# Patient Record
Sex: Male | Born: 1945 | Race: White | Hispanic: No | Marital: Married
Health system: Midwestern US, Community
[De-identification: ages and names within clinical notes are randomized; demographics above are authoritative.]

## PROBLEM LIST (undated history)

## (undated) DIAGNOSIS — N401 Enlarged prostate with lower urinary tract symptoms: Secondary | ICD-10-CM

## (undated) DIAGNOSIS — Z973 Presence of spectacles and contact lenses: Secondary | ICD-10-CM

## (undated) DIAGNOSIS — G9520 Unspecified cord compression: Secondary | ICD-10-CM

## (undated) DIAGNOSIS — I10 Essential (primary) hypertension: Secondary | ICD-10-CM

## (undated) DIAGNOSIS — G629 Polyneuropathy, unspecified: Secondary | ICD-10-CM

## (undated) DIAGNOSIS — G8929 Other chronic pain: Secondary | ICD-10-CM

## (undated) DIAGNOSIS — I82409 Acute embolism and thrombosis of unspecified deep veins of unspecified lower extremity: Secondary | ICD-10-CM

## (undated) DIAGNOSIS — M48061 Spinal stenosis, lumbar region without neurogenic claudication: Secondary | ICD-10-CM

## (undated) DIAGNOSIS — Z7901 Long term (current) use of anticoagulants: Secondary | ICD-10-CM

## (undated) DIAGNOSIS — R011 Cardiac murmur, unspecified: Secondary | ICD-10-CM

## (undated) DIAGNOSIS — M25511 Pain in right shoulder: Secondary | ICD-10-CM

## (undated) DIAGNOSIS — B353 Tinea pedis: Secondary | ICD-10-CM

## (undated) DIAGNOSIS — H2510 Age-related nuclear cataract, unspecified eye: Secondary | ICD-10-CM

## (undated) DIAGNOSIS — M75100 Unspecified rotator cuff tear or rupture of unspecified shoulder, not specified as traumatic: Secondary | ICD-10-CM

## (undated) DIAGNOSIS — M545 Low back pain, unspecified: Secondary | ICD-10-CM

## (undated) DIAGNOSIS — M48062 Spinal stenosis, lumbar region with neurogenic claudication: Secondary | ICD-10-CM

## (undated) DIAGNOSIS — M1990 Unspecified osteoarthritis, unspecified site: Secondary | ICD-10-CM

## (undated) HISTORY — DX: Unspecified cord compression: G95.20

## (undated) HISTORY — PX: REPLACEMENT TOTAL KNEE: SUR1224

## (undated) HISTORY — PX: HX BACK SURGERY: SHX140

## (undated) HISTORY — DX: Polyneuropathy, unspecified: G62.9

## (undated) HISTORY — PX: HX TONSILLECTOMY: SHX27

## (undated) HISTORY — PX: COLONOSCOPY: SHX174

## (undated) HISTORY — DX: Acute embolism and thrombosis of unspecified deep veins of unspecified lower extremity: I82.409

## (undated) HISTORY — DX: Essential (primary) hypertension: I10

## (undated) HISTORY — PX: HX WISDOM TEETH EXTRACTION: SHX21

## (undated) HISTORY — PX: HX CERVICAL SPINE SURGERY: 2100001197

## (undated) NOTE — Consults (Signed)
Associated Order(s): IP CONSULT TO HOSPITALIST  Formatting of this note is different from the original.  H&P    Patient: Gary White               Sex: male             MRN: [id number]    Date of Birth:  1946-04-02      Age:  81 y.o.      No chief complaint on file.    SHPI     Gary White is a 58 y.o. male. Medical service consult was placed for Medical management.   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  Discontinued warfarin 1 month ago.  Patient decided just not take it anymore.  Was told that he was out of risk range.    Past Medical History:   Diagnosis Date   ? Arthritis    ? BPH (benign prostatic hyperplasia)    ? Cervical disc disease    ? 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 ED (erectile dysfunction)    ? Hypertension    ? Sleep apnea     CPAP   ? 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 HX BACK SURGERY  03/2016    Lumbar "Drill holes and cleaned it out".   ? HX CATARACT REMOVAL Bilateral    ? HX ENDOSCOPY      colonoscopy last was in 2008    ? 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 Arthritis-osteo Mother    ? Hypertension Mother    ? Arthritis-osteo Father    ? Arthritis-osteo Brother    ? Cancer Paternal Uncle      unknown   ? Cancer Paternal Uncle      stomach   ? Diabetes Paternal Uncle      Social History     Social History   ? Marital status: MARRIED     Spouse name: N/A   ? Number of children: N/A   ? Years of education: N/A     Social History Main Topics   ? Smoking status: Former Smoker     Packs/day: 0.50     Years: 20.00     Types: Cigarettes     Quit date: 11/30/1984   ? Smokeless tobacco: Never Used   ? Alcohol use No   ? Drug use: No   ? Sexual activity: Yes     Partners: Female     Other Topics Concern   ? Blood Transfusions No     Social History Narrative    Medicare  wellness 09/23/13      Prior to Admission medications    Medication Sig Start Date End Date Taking? Authorizing Provider   cpap machine kit    Yes Historical Provider   diclofenac EC (VOLTAREN) 75 mg EC tablet Take 75 mg by mouth two (2) times a day.   Yes Historical Provider   tamsulosin (FLOMAX) 0.4 mg capsule Take 1 Cap by mouth daily. 11/26/15  Yes Brian P Defade, DO   hydrocortisone valerate (WESTCORT) 0.2 % topical cream Apply  to affected area two (2) times a day. use thin layer   Yes Historical  Provider   testosterone 75 mg pllt by Implant route.   Yes Historical Provider   warfarin (COUMADIN) 5 mg tablet TAKE AS DIRECTED 11/26/15  Yes Hermenia Bers, DO   warfarin (COUMADIN) 1 mg tablet As directed 09/09/15  Yes Lauren Dinah Beers, DO   multivitamin (ONE A DAY) tablet Take 0.5 Tabs by mouth two (2) times a day.   Yes Historical Provider   lisinopril (PRINIVIL, ZESTRIL) 10 mg tablet Take 10 mg by mouth daily.    Historical Provider     No Known Allergies    Review of Systems  Comprehensive 10 point review of systems is Negative except as mentioned in HPI.     Immunizations: Immunization history is noncontributory to this encounter.    Physical Exam     Visit Vitals   ? BP 131/70 (BP 1 Location: Left arm, BP Patient Position: At rest)   ? Pulse 98   ? Temp 98.2 F (36.8 C)   ? Resp 18   ? Ht 5\' 10"  (1.778 m)   ? Wt 92.5 kg (204 lb)   ? SpO2 100%   ? BMI 29.27 kg/m2     Temp (24hrs), Avg:98.1 F (36.7 C), Min:97.7 F (36.5 C), Max:98.4 F (36.9 C)    Oxygen Therapy  O2 Sat (%): 100 % (04/18/16 1945)  Pulse via Oximetry: 86 beats per minute (04/18/16 1755)  O2 Device: Room air (04/18/16 1945)    Intake/Output Summary (Last 24 hours) at 04/18/16 2049  Last data filed at 04/18/16 1728   Gross per 24 hour   Intake             1000 ml   Output              800 ml   Net              200 ml       General: No acute distress,Alert, Ox3  HEENT:           Atraumatic, PERRLwithout icterus, oral mucosa moist  Lungs:  CTA  Bilaterally.   No wheezing, no crackles  CVS:  Regular rate and rhythm, No murmur, rub, or gallop, no carotid bruits.    No edema.  Abdomen: Soft, Non distended, Non tender, Positive bowel sounds  Extremities: No cyanosis, clubbing, pedal pulses present  Neurologic: No focal deficits, CN 3-12 intact, no motor/sensory deficits noted in                                  extremities  Skin:               No rashes  Hematologic:  No splenomegaly, excessive bruising or petechiae noted  Lymphatics:    No cervical, axillary or inguinal adenopathy    Musculoskeletal: Neck supple, no joint/muscle tenderness to palpation  Psych:             Appropriate mood and affect    Lab/Data Reviewed:  Recent Results (from the past 24 hour(s))   URINALYSIS W/ RFLX MICROSCOPIC    Collection Time: 04/18/16  6:49 PM   Result Value Ref Range    Color AMBER      Appearance CLOUDY      Specific gravity 1.020 1.002 - 1.030      pH (UA) 7.5 4.5 - 8.0      Protein NEGATIVE  mg/dL    Glucose NEGATIVE  mg/dL    Ketone NEGATIVE  mg/dL    Bilirubin NEGATIVE       Blood NEGATIVE       Urobilinogen 1.0 0 - 1 EU/dL    Nitrites NEGATIVE       Leukocyte Esterase NEGATIVE      URINE MICROSCOPIC    Collection Time: 04/18/16  6:49 PM   Result Value Ref Range    WBC 0-3 0 - 2 /hpf    RBC NONE 0 - 2 /hpf    Epithelial cells 3-5 /lpf    Bacteria 1+ /hpf   PROTHROMBIN TIME + INR    Collection Time: 04/18/16  8:09 PM   Result Value Ref Range    INR 1.0 0.9 - 1.1      Prothrombin time 13.0 11.4 - 15.1 sec     Assessment/Plan     20 y.o. male.  we were consulted for medical management.     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  Bridge with Lovenox.   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 Management as mentioned above.      Hypertension (04/18/2016):  No acute  issues  Continue lisinopril.    DVT ppx:   Lovenox/warfarin.     We appreciate this consult.  We will continue to follow along with you.    Signed:  Vickie Epley, MD  04/18/2016   8:54 PM    I personally spent a total of  35 minutes in the care of this patient.   Electronically signed by Vickie Epley, MD at 04/18/2016  8:54 PM EDT

## (undated) NOTE — Progress Notes (Signed)
Formatting of this note is different from the original.  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    Lab Results   Component Value Date/Time    Prostate Specific Ag 3.4 05/23/2018 09:00 AM    Prostate Specific Ag 1.5 01/02/2017 08:15 AM    Prostate Specific Ag 2.4 03/16/2016 10:15 AM     Results for orders placed or performed in visit on 04/26/17   AMB POC URINALYSIS DIP STICK AUTO W/O MICRO     Status: None   Result Value Ref Range Status    Color (UA POC) Yellow  Final    Clarity (UA POC) Clear  Final    Glucose (UA POC) Negative Negative Final    Bilirubin (UA POC) Negative Negative Final    Ketones (UA POC) Negative Negative Final    Specific gravity (UA POC) 1.015 1.001 - 1.035 Final    Blood (UA POC) Negative Negative Final    pH (UA POC) 7.5 4.6 - 8.0 Final    Protein (UA POC) Negative Negative Final    Urobilinogen (UA POC) 0.2 mg/dL 0.2 - 1 Final    Nitrites (UA POC) Negative Negative Final    Leukocyte esterase (UA POC) Negative Negative Final     Pre-biopsy Preparations:  ? omnicef 100 mg TID po  ? Fleets enema--2 hours prior to procedure.  ? Gentamicin 80 mg IM in the right buttocks    Transrectal Ultrasound Imaging of the Prostate, and Bilateral Neurovascular bundle block:    Multiple transverse and longitudinal images of the prostate, seminal vesicles and bladder revealed enlarged prostate of 50 grams.  Neurovascular bundle block was done bilaterally by injecting marcaine 0.25% plain under ultrasound guidance.  5 mL were injected into periprostatic tissue at the level of prostate base bilaterally in the neurovascular bundle area using a spinal needle.    Prostate dimension:  Height 33.1 mm  Length 53.6 mm  Width 54.2 mm  Prostate volume: 50.4 cm cubed  PSA Density: 0.07    Biopsy Procedure:  Local anesthesia of the perineum was achieved by injecting 2% Lidocaine jelly into  the rectal vault.  Neurovascular bundle block was achieved as described above.  An 18 gauge biopsy needle was then passed through the ultrasound guide obtaining 2 cores of tissue from the left apex, mid gland, and base.  2 cores of tissue from the right apex, mid gland, and base were obtained.  The biopsy needle placement was guided by real time transrectal ultrasound imaging of the needle along its path through the rectal mucosa and into the prostate. The patient tolerated the procedure with minimal blood loss.  The patient was given oral and written instructions regarding post biopsy precautions as well as instruction regarding activity, medication, and follow up.    Assessment:  1. Elevated PSA  2. BPH with LUTS    Plan:  1. Complete antibiotics  2. Post-biopsy instructions were given  3. I will see back in the office in 2 weeks for pathology review    Electronically signed by Laureen Ochs at 07/30/2018  3:44 PM EST

## (undated) NOTE — Progress Notes (Signed)
Formatting of this note might be different from the original.  Reviewed home exercise program with pt to cont working on ROM stretches and exercises. Pt voices understanding.  Electronically signed by Sabino Donovan at 04/20/2016 11:43 AM EDT

## (undated) NOTE — Progress Notes (Signed)
Formatting of this note might be different from the original.  Send copy to [place] (derm in [place])  Overall labs work looks good; there are a few labs that are outside the standard normal range, clinical significance doubtful  Electronically signed by Jeronimo Greaves E at 03/17/2016 10:59 AM EDT

## (undated) NOTE — Progress Notes (Signed)
Formatting of this note might be different from the original.  Notified per Missy, PTA, that walkers delivered today have wheels.   Phone call to [place] at [facility] to have walker switched out.  Pt notified at bedside and verablizes understanding.   Also reports that his wife had surgery on her hand today and she will not be able to assist him to OP therapy.  Requests [facility] HH when given list of providers, chooses [facility].    Referral placed into CC and called to [facility].  Per [facility] Pharmacist, Lovenox is $51.70 for 5 syringes.  Pt notified at bedside and requests that RX be transferred to [facility].    [place], Rx must be backed out per [facility] and RX will need to be sent there.  Report to Yisroel Ramming, who will page Dr Ericka Pontiff for RX for meds to beds. Report to Hollie Salk, Therapist, sports.     Discharge RN to call report to [place] [phone] and make sure that walker has been switched out per [facility] to standard walker. Pt wants Meds to Beds.     Note to Kardex.   Electronically signed by Royetta Asal at 04/20/2016 12:47 PM EDT

## (undated) NOTE — Progress Notes (Signed)
Formatting of this note might be different from the original.  Patient notified, voiced understanding.  Faxed to [place] office.  Electronically signed by Depriest, Tiffany L at 03/17/2016 11:54 AM EDT

## (undated) NOTE — Unmapped External Note (Signed)
Formatting of this note is different from the original.  TRANSFER - OUT REPORT:    Verbal report given to RN, Morey Hummingbird on Gary White  being transferred to Ortho (unit) for routine post - op       Report consisted of patient?s Situation, Background, Assessment and   Recommendations(SBAR).     Information from the following report(s) SBAR was reviewed with the receiving nurse.    Opportunity for questions and clarification was provided.      Patient transported with:   Registered Nurse      Electronically signed by Cleophus Molt at 04/18/2016  5:51 PM EDT

## (undated) NOTE — Progress Notes (Signed)
Formatting of this note might be different from the original.  Problem: Falls - Risk of  Goal: *Absence of Falls  Document Schmid Fall Risk and appropriate interventions in the flowsheet.   Outcome: Progressing Towards Goal  Fall Risk Interventions:  Mobility Interventions: Patient to call before getting OOB        Medication Interventions: Bed/chair exit alarm            Problem: Patient Education: Go to Patient Education Activity  Goal: Patient/Family Education  Outcome: Progressing Towards Goal  Patient will not fall this shift.      Electronically signed by Jenna Luo at 04/20/2016  4:21 AM EDT

## (undated) NOTE — Progress Notes (Signed)
Formatting of this note is different from the original.  Ortho Daily Progress Note    04/20/2016  3:26 PM    POD:  2  S/P:  tka    Afebrile/VSS  Doing well without complaints of nausea  Pain well controlled    Recent Results (from the past 24 hour(s))   CBC WITH AUTOMATED DIFF    Collection Time: 04/19/16  4:29 PM   Result Value Ref Range    WBC 11.5 (H) 4.5 - 10.8 K/uL    RBC 4.02 (L) 4.7 - 6.1 M/uL    HGB 13.4 (L) 13.5 - 17.5 g/dL    HCT 37.7 (L) 41 - 53 %    MCV 93.8 80 - 100 FL    MCH 33.3 (H) 27 - 31 PG    MCHC 35.5 31 - 37 g/dL    RDW 13.4 11.5 - 14.5 %    PLATELET 100 (L) 130 - 400 K/uL    MPV 9.7 5.9 - 10.3 FL    NEUTROPHILS 85 (H) 40 - 74 %    LYMPHOCYTES 7 (L) 19 - 48 %    MONOCYTES 8 3 - 9 %    EOSINOPHILS 0 0 - 5 %    BASOPHILS 0 0 - 2 %    ABS. NEUTROPHILS 9.7 (H) 1.5 - 8.0 K/UL    ABS. LYMPHOCYTES 0.8 0.8 - 3.5 K/UL    ABS. MONOCYTES 0.9 0.8 - 3.5 K/UL    ABS. EOSINOPHILS 0.0 0.0 - 0.5 K/UL    ABS. BASOPHILS 0.0 0.0 - 0.1 K/UL    DF AUTOMATED      IMMATURE GRANULOCYTES 0 (L) 2 - 10 %    ABS. IMM. GRANS. 0.0 K/UL   PROTHROMBIN TIME + INR    Collection Time: 04/19/16  4:29 PM   Result Value Ref Range    INR 1.2 (H) 0.9 - 1.1      Prothrombin time 15.6 (H) 11.4 - 15.1 sec   CBC WITH AUTOMATED DIFF    Collection Time: 04/20/16  5:41 AM   Result Value Ref Range    WBC 7.3 4.5 - 10.8 K/uL    RBC 4.00 (L) 4.7 - 6.1 M/uL    HGB 12.8 (L) 13.5 - 17.5 g/dL    HCT 37.8 (L) 41 - 53 %    MCV 94.5 80 - 100 FL    MCH 32.0 (H) 27 - 31 PG    MCHC 33.9 31 - 37 g/dL    RDW 13.4 11.5 - 14.5 %    PLATELET 95 (L) 130 - 400 K/uL    MPV 10.0 5.9 - 10.3 FL    NEUTROPHILS 79 (H) 40 - 74 %    LYMPHOCYTES 11 (L) 19 - 48 %    MONOCYTES 8 3 - 9 %    EOSINOPHILS 2 0 - 5 %    BASOPHILS 0 0 - 2 %    ABS. NEUTROPHILS 5.8 1.5 - 8.0 K/UL    ABS. LYMPHOCYTES 0.8 0.8 - 3.5 K/UL    ABS. MONOCYTES 0.6 (L) 0.8 - 3.5 K/UL    ABS. EOSINOPHILS 0.1 0.0 - 0.5 K/UL    ABS. BASOPHILS 0.0 0.0 - 0.1 K/UL    DF AUTOMATED      IMMATURE GRANULOCYTES 0 (L)  2 - 10 %    ABS. IMM. GRANS. 0.0 K/UL   PROTHROMBIN TIME + INR    Collection Time: 04/20/16  5:41 AM   Result Value  Ref Range    INR 1.6 (H) 0.9 - 1.1      Prothrombin time 18.7 (H) 11.4 - 15.1 sec       Dressings clean and dry  Moving LE well  Neurocirculatory exam WNL.    PLAN:  Tolerating regular diet  DVT prophylaxis-lovenox/coumadin  WBAT with PT-mobilization  Pain Control  Plan to D/C home  Hospitalist consult    Riley Lam, MD      Electronically signed by Riley Lam, MD at 04/20/2016  3:27 PM EDT

## (undated) NOTE — Progress Notes (Signed)
Formatting of this note might be different from the original.  Pt seated in chair on entry. Reports he is still "numb". KI applied. Sit>stand CGA. Gait training 145ft with SW and CGA. Knee buckling under brace, pt using UE's to support weight through walker. Stair training ascending/decending with CGA. Returned to chair. Seated A/AA ROM exercises/stretches for ROM/strength/function. ROM ~5-95 degrees. Pt seated in chair with call button in reach.  Electronically signed by Sabino Donovan at 04/19/2016 10:57 AM EDT

## (undated) NOTE — Progress Notes (Signed)
Formatting of this note might be different from the original.  Pt known to me from Prehab class. Spoke with pt at bedside this am who reports that he plans to go to OP therapy and wife plans to provide transportation.  States has cane but will need walker and chooses [facility] from provider list.   States has been on Coumadin at home and plans to use coumadin upon discharge.     Referral called and faxed to [facility] for walker delivery to room    Resolved from CM point of view. CM to be available to assist should needs arise.     Discharge RN to make sure that pt has RX for OP therapy and call [facility] [phone] if walker has not been delivered to room prior to discharge.  Note to kardex.     Care Management Interventions  PCP Verified by CM: Yes  Mode of Transport at Discharge: Self (family)  Transition of Care Consult (CM Consult): Discharge Planning  Discharge Durable Medical Equipment: No  Physical Therapy Consult: Yes  Occupational Therapy Consult: Yes  Speech Therapy Consult: No  Current Support Network: Lives with Spouse  Confirm Follow Up Transport: Family  Plan discussed with Pt/Family/Caregiver: Yes  Freedom of Choice Offered: Yes  Discharge Location  Discharge Placement: Home with home health  Electronically signed by Royetta Asal at 04/19/2016  3:45 PM EDT

## (undated) NOTE — Progress Notes (Signed)
Formatting of this note is different from the original.  HISTORY OF PRESENT ILLNESS  Gary White is a 78 y.o. male.    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    Chief Complaint   Patient presents with   ? Follow Up Chronic Condition     3 month follow up -FLU SHOT TODAY     HPI  The patient is a very pleasant 40 -year-old white male with the above listed past medical history who presents to the office today to follow up care.      The patient presents to follow up for longstanding hypertension. He has lost 20 lbs and stopped his lisinopril - initially cut 20 mg in half when BP started going low.   Pt is a  is taking BP at home has been well controlled. States its never higher than 135/83. Averages 125/80.   He denies any symptoms referable to hypertension. This would include but is not limited to headache, chest pain, shortness of breath, nausea, vomiting and neurological deficits.    BP Readings from Last 3 Encounters:   03/16/16 132/78   12/01/15 120/70   11/26/15 120/72     The patient presents to follow up for chronic anticoagulation on Coumadin.  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  The patient takes his PT and INR at home and the numbers are called or faxed to the office.  He has had stable levels for several months.  He is doing well, no signs of bleeding, no changes in diet, no recent illness.     The patient also presents to follow up today for longstanding elevated cholesterol.  He has not had his cholesterol checked in several months.  He has been trying to follow a diet closely.  About to have back and knee surgery so not exercising as much - but very careful with diet. Marland Kitchen  He is trying to be more active.    Overall the patient states the is doing quite well.  Discussed the patient's above normal  BMI with him. Body mass index is 29.56 kg/(m^2).    I have recommended the following interventions: weight loss from baseline weight .  The BMI follow up plan is as follows: BMI is out of normal parameters and plan is as follows: I have counseled this patient on diet and exercise regimens    Wt Readings from Last 3 Encounters:   03/16/16 206 lb (93.4 kg)   12/01/15 221 lb (100.2 kg)   11/26/15 221 lb (100.2 kg)     Lab Results   Component Value Date/Time    Cholesterol, total 161 09/04/2015 09:00 AM    HDL Cholesterol 38 09/04/2015 09:00 AM    LDL, calculated 99.64 09/04/2015 09:00 AM    VLDL, calculated 29.2 09/04/2015 09:00 AM    Triglyceride 146 09/04/2015 09:00 AM     The patient does not have known history of coronary artery disease.      Sees Dr. Abner Greenspan for ED and BPH - Needs PSA   Lab Results   Component Value Date/Time    Prostate Specific Ag 2.9 12/09/2014 08:40 AM    Prostate Specific Ag 2.2 09/23/2013 09:35 AM    Prostate Specific Ag 3.8 02/29/2012 08:55 AM         Preventative Health  Requests flu shot  flu shot today --  Needs Prevnar 13     Lab Results   Component Value Date/Time    Prostate Specific Ag 2.9 12/09/2014 08:40 AM    Prostate Specific Ag 2.2 09/23/2013 09:35 AM    Prostate Specific Ag 3.8 02/29/2012 08:55 AM     Review of Systems   Constitutional: Negative for fever, chills, weight loss, malaise/fatigue and diaphoresis.   HENT: Negative for congestion and sore throat.    Eyes: Negative for blurred vision and photophobia.   Respiratory: Negative for cough and shortness of breath.    Cardiovascular: Negative for chest pain, palpitations and leg swelling.   Gastrointestinal: Negative for heartburn, nausea, vomiting, abdominal pain, diarrhea, constipation, blood in stool and melena.   Genitourinary: Negative for dysuria.   Musculoskeletal: Negative for myalgias. +joint pain   Skin: Postive for rash- Seeing Dr. Diamantina Providence -- recurrent  Contact Dermatitis    Neurological: Negative for dizziness,  loss of consciousness, weakness and headaches.   Psychiatric/Behavioral: Negative.      Past Medical History:   Diagnosis Date   ? Arthritis    ? BPH (benign prostatic hyperplasia)    ? Cervical disc disease    ? DVT (deep venous thrombosis) ([place]) x 2 2009  and 2011     after knee surgery then after long car ride    ? ED (erectile dysfunction)    ? Hypertension    ? Thromboembolus [facility])      Past Surgical History:   Procedure Laterality Date   ? HX ENDOSCOPY      colonoscopy last was in 2008    ? 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 HX ORTHOPAEDIC       Family History   Problem Relation Age of Onset   ? Arthritis-osteo Mother    ? Hypertension Mother    ? Arthritis-osteo Father    ? Arthritis-osteo Brother    ? Cancer Paternal Uncle      unknown   ? Cancer Paternal Uncle      stomach   ? Diabetes Paternal Uncle      Social History     Social History   ? Marital status: MARRIED     Spouse name: N/A   ? Number of children: N/A   ? Years of education: N/A     Occupational History   ? Not on file.     Social History Main Topics   ? Smoking status: Former Smoker     Packs/day: 0.50     Years: 20.00     Types: Cigarettes     Quit date: 11/30/1984   ? Smokeless tobacco: Never Used   ? Alcohol use Yes      Comment: 3 glasses of wine "couple of drinks"    ? Drug use: No   ? Sexual activity: Yes     Partners: Female     Other Topics Concern   ? Blood Transfusions No     Social History Narrative    Medicare wellness 09/23/13      No Known Allergies  Current Outpatient Prescriptions on File Prior to Visit   Medication Sig Dispense Refill   ? tamsulosin (FLOMAX) 0.4 mg capsule Take 1 Cap by mouth daily. 90 Cap 3   ? hydrocortisone valerate (WESTCORT) 0.2 % topical cream Apply  to affected area two (2) times a day. use thin layer     ? testosterone  75 mg pllt by Implant route.     ? warfarin (COUMADIN) 5 mg tablet TAKE AS DIRECTED 90 Tab 3   ? multivitamin (ONE A DAY) tablet Take 1 Tab by mouth daily.     ? warfarin  (COUMADIN) 1 mg tablet As directed 180 Tab 2   ? fexofenadine (ALLEGRA) 180 mg tablet Take  by mouth daily.       No current facility-administered medications on file prior to visit.      Vitals:    03/16/16 0932   BP: 132/78   Pulse: 75   Resp: 18   Temp: 98.6 F (37 C)   TempSrc: Oral   SpO2: 99%   Weight: 206 lb (93.4 kg)   Height: 5\' 10"  (1.778 m)   .  Body mass index is 29.56 kg/(m^2).    Physical Exam   Nursing note and vitals reviewed.  Constitutional: He is oriented to person, place, and time. He appears well-developed and well-nourished. No distress.   HENT:   Head: Normocephalic and atraumatic.   Right Ear: External ear normal.   Left Ear: External ear normal.   Nose: Nose normal.   Mouth/Throat: Oropharynx is clear and moist. No oropharyngeal exudate.   Eyes: Conjunctivae are normal. Pupils are equal, round, and reactive to light. No scleral icterus.   Neck: Normal range of motion. Neck supple. No JVD present. No tracheal deviation present.   Cardiovascular: Normal rate, regular rhythm, normal heart sounds and intact distal pulses.  Exam reveals no gallop and no friction rub.    No murmur heard.  Pulmonary/Chest: Effort normal and breath sounds normal. No respiratory distress.   Abdominal: Soft. Bowel sounds are normal. He exhibits no distension and no mass. There is no tenderness.   Musculoskeletal:   Knee (r) crepitus - otherwise unremarkable   Lymphadenopathy:     He has no cervical adenopathy.   Neurological: He is alert and oriented to person, place, and time.   Skin: Skin is warm and dry. He has bilateral hemosiderin pigmentation, with no leg hair, and ulcerating rashes on LE being followed by Derm, without evidence of cellulitis.   Erythematous rash on extremities   Psychiatric: He has a normal mood and affect. His behavior is normal.     ASSESSMENT and PLAN    ICD-10-CM ICD-9-CM    1. Benign essential HTN I10 401.1 CBC WITH AUTOMATED DIFF      METABOLIC PANEL, COMPREHENSIVE      TSH 3RD  GENERATION   2. Encounter for immunization Z23 V03.89 pneumococcal 13 val conj dip (PREVNAR-13) 0.5 mL syrg injection   3. Screening PSA (prostate specific antigen) Z12.5 V76.44 PSA SCREENING (SCREENING)   4. Dyslipidemia E78.5 272.4 LIPID PANEL   5. Thromboembolus ([facility]) I74.9 444.9    6. Anticoagulated on Coumadin Z51.81 V58.83     Z79.01 V58.61    Continue coumadin   Continue with specialist    Follow-up Disposition: 6 months   lab results and schedule of future lab studies reviewed with patient  reviewed diet, exercise and weight control  cardiovascular risk and specific lipid/LDL goals reviewed  reviewed medications and side effects in detail    I have discussed the above plan with the patient. Patient has also been given instructions and information on his conditions.  Patient is aware of all possible side effects and agrees with the above mentioned plan.     Recommended sodium restriction. Reviewed diet, exercise and weight control.  Recommend 1800 cal low cholesterol (low fat) lean protein whole grain diet       Hermenia Bers, DO    Electronically signed by Jeronimo Greaves E at 03/16/2016 10:08 AM EDT

## (undated) NOTE — Progress Notes (Signed)
Formatting of this note might be different from the original.  Bedside, Verbal and Written shift change report given to [employer] (Soil scientist) by Wille Glaser (offgoing nurse). Report included the following information Kardex, MAR, Recent Results and Med Rec Status.   Electronically signed by Ronald Pippins, RN at 04/19/2016  6:02 PM EDT

## (undated) NOTE — Progress Notes (Signed)
Formatting of this note might be different from the original.  Pt resting in chair, chair locked. Pt denied any need at this time and states his right leg is still numb. Will continue to monitor.    Electronically signed by Ronald Pippins, RN at 04/19/2016  1:38 PM EDT

## (undated) NOTE — Progress Notes (Signed)
Formatting of this note might be different from the original.  Pt in chair, chair in locked position, pt wife at bedside. Pt denied any need at this time. Hourly rounds completed.   Electronically signed by Ronald Pippins, RN at 04/19/2016  1:40 PM EDT

## (undated) NOTE — Progress Notes (Signed)
Formatting of this note might be different from the original.  [facility] Initial Evaluation:    Orders received, chart reviewed, and initial evaluation completed on   NAME: Gary White AGE: 60 y.o.  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 Results of the evaluation consist of:   BUE AROM WFL, BUE MMS 5/5  Alert, oriented x 4  Balance  Sitting: Intact  Standing: With support      Functional Transfers  Sit to Stand: Moderate assistance  Stand to Sit: Moderate assistance  Bed to Chair: Moderate assistance  Toilet Transfer : Moderate assistance      Basic ADL  Feeding: Independent  Oral Facial Hygiene/Grooming: Independent  Bathing: Moderate assistance  Upper Body Dressing: Setup  Lower Body Dressing: Moderate assistance      Coordination  Fine Motor Skills-Upper: Left Intact, Right Intact  Gross Motor Skills-Upper: Left Intact, Right Intact  Pain symptom/location: moderate, right lower extremity    Based on this evaluation, acute care occupational therapy services are not warranted at this time. Patient's spouse will be available to assist prn w/ ADLs upon d/c to home. Physical therapy to see to address ROM, ambulation, and functional transfer needs.    Treatment Plan: Evaluation only    The plan of care has been discussed with the patient.     Thank you for this consult.  Therapist: Risa Grill, OTR/L, CLT                   Occupational Therapist, Certified Lymphedema Therapist  Electronically signed by Yehuda Budd D, OT at 04/19/2016  8:27 AM EDT

## (undated) NOTE — Progress Notes (Signed)
Formatting of this note is different from the original.  Images from the original note were not included.      Ortho Daily Progress Note    POD:  1  S/P:  Right TKA    Afebrile/VSS  Doing well without complaints of nausea  Pain well controlled    Recent Results (from the past 24 hour(s))   URINALYSIS W/ RFLX MICROSCOPIC    Collection Time: 04/18/16  6:49 PM   Result Value Ref Range    Color AMBER      Appearance CLOUDY      Specific gravity 1.020 1.002 - 1.030      pH (UA) 7.5 4.5 - 8.0      Protein NEGATIVE  mg/dL    Glucose NEGATIVE  mg/dL    Ketone NEGATIVE  mg/dL    Bilirubin NEGATIVE       Blood NEGATIVE       Urobilinogen 1.0 0 - 1 EU/dL    Nitrites NEGATIVE       Leukocyte Esterase NEGATIVE      CULTURE, URINE    Collection Time: 04/18/16  6:49 PM   Result Value Ref Range    Special Requests: NO SPECIAL REQUESTS      Culture result: NO GROWTH AFTER 13 HOURS     URINE MICROSCOPIC    Collection Time: 04/18/16  6:49 PM   Result Value Ref Range    WBC 0-3 0 - 2 /hpf    RBC NONE 0 - 2 /hpf    Epithelial cells 3-5 /lpf    Bacteria 1+ /hpf   PROTHROMBIN TIME + INR    Collection Time: 04/18/16  8:09 PM   Result Value Ref Range    INR 1.0 0.9 - 1.1      Prothrombin time 13.0 11.4 - 0000000 sec   METABOLIC PANEL, BASIC    Collection Time: 04/19/16  4:13 AM   Result Value Ref Range    Sodium 142 136 - 145 mmol/L    Potassium 3.8 3.5 - 5.3 mmol/L    Chloride 109 (H) 98 - 107 mmol/L    CO2 26 21 - 32 mmol/L    Anion gap 7 6 - 15 mmol/L    Glucose 120 (H) 70 - 110 mg/dL    BUN 13 7 - 18 MG/DL    Creatinine 0.78 0.60 - 1.30 MG/DL    BUN/Creatinine ratio 17 7 - 25      GFR est AA >60 >60 ml/min/1.39m2    GFR est non-AA >60 >60 ml/min/1.85m2    Calcium 8.7 8.5 - 10.1 MG/DL   CBC WITH AUTOMATED DIFF    Collection Time: 04/19/16  4:13 AM   Result Value Ref Range    WBC 11.5 (H) 4.5 - 10.8 K/uL    RBC 4.35 (L) 4.7 - 6.1 M/uL    HGB 13.9 13.5 - 17.5 g/dL    HCT 38.8 (L) 41 - 53 %    MCV 89.2 78 - 98 FL    MCH 32.0 (H) 27 - 31 PG     MCHC 35.8 31 - 37 g/dL    RDW 13.1 11.5 - 14.5 %    PLATELET 95 (L) 130 - 400 K/uL    MPV 10.0 5.9 - 10.3 FL    NEUTROPHILS 95 (H) 40 - 74 %    LYMPHOCYTES 2 (L) 19 - 48 %    MONOCYTES 3 3 - 9 %    EOSINOPHILS 0 0 -  5 %    BASOPHILS 0 0 - 2 %    ABS. NEUTROPHILS 10.8 (H) 1.5 - 8.0 K/UL    ABS. LYMPHOCYTES 0.3 (L) 0.8 - 3.5 K/UL    ABS. MONOCYTES 0.4 (L) 0.8 - 3.5 K/UL    ABS. EOSINOPHILS 0.0 0.0 - 0.5 K/UL    ABS. BASOPHILS 0.0 0.0 - 0.1 K/UL    DF AUTOMATED         Dressings clean and dry  Moving LE well  Neurocirculatory exam WNL.    PLAN:  Tolerating regular diet  DVT prophylaxis-coumadin/lovenox  WBAT with PT-mobilization  Pain Control  Plan to D/C to Outpatient PT  Hospitalist consult    Jonelle Sidle, PA  04/19/2016  3:05 PM      Electronically signed by Riley Lam, MD at 04/20/2016  3:46 PM EDT

## (undated) NOTE — Progress Notes (Signed)
Formatting of this note is different from the original.  Progress Note    Patient: Gary White               Sex: male          MRN: [id number]      Date of Birth:  [birth year]      Age:  14 y.o.                    Subjective:     Doing well.  Cont to have numbness in rt leg following spinal anesthesia.      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  No other personal or family hx of clots.    Objective:     Visit Vitals   ? BP 112/61 (BP 1 Location: Right arm, BP Patient Position: At rest)   ? Pulse 84   ? Temp 98.5 F (36.9 C)   ? Resp 20   ? Ht 5\' 10"  (1.778 m)   ? Wt 92.5 kg (204 lb)   ? SpO2 97%   ? BMI 29.27 kg/m2     Physical Exam  Gen: aa, nad  Pulm: ctab  Heart: s1s2 rrr no rmg  Abd: s/nt/nd/bs+  Eyes anicteric  Neuro no tremor  Psych no psychomotor agitation  Skin no rash  MS - rt knee bandaged in surgical dressing.    LABS:  Recent Results (from the past 24 hour(s))   URINALYSIS W/ RFLX MICROSCOPIC    Collection Time: 04/18/16  6:49 PM   Result Value Ref Range    Color AMBER      Appearance CLOUDY      Specific gravity 1.020 1.002 - 1.030      pH (UA) 7.5 4.5 - 8.0      Protein NEGATIVE  mg/dL    Glucose NEGATIVE  mg/dL    Ketone NEGATIVE  mg/dL    Bilirubin NEGATIVE       Blood NEGATIVE       Urobilinogen 1.0 0 - 1 EU/dL    Nitrites NEGATIVE       Leukocyte Esterase NEGATIVE      CULTURE, URINE    Collection Time: 04/18/16  6:49 PM   Result Value Ref Range    Special Requests: NO SPECIAL REQUESTS      Culture result: NO GROWTH AFTER 13 HOURS     URINE MICROSCOPIC    Collection Time: 04/18/16  6:49 PM   Result Value Ref Range    WBC 0-3 0 - 2 /hpf    RBC NONE 0 - 2 /hpf    Epithelial cells 3-5 /lpf    Bacteria 1+ /hpf   PROTHROMBIN TIME + INR    Collection Time: 04/18/16  8:09 PM   Result Value Ref Range    INR 1.0 0.9 - 1.1      Prothrombin time 13.0 11.4 - 0000000 sec   METABOLIC PANEL, BASIC    Collection Time: 04/19/16  4:13 AM   Result Value Ref Range     Sodium 142 136 - 145 mmol/L    Potassium 3.8 3.5 - 5.3 mmol/L    Chloride 109 (H) 98 - 107 mmol/L    CO2 26 21 - 32 mmol/L    Anion gap 7 6 - 15 mmol/L    Glucose 120 (H) 70 - 110 mg/dL    BUN 13 7 - 18 MG/DL  Creatinine 0.78 0.60 - 1.30 MG/DL    BUN/Creatinine ratio 17 7 - 25      GFR est AA >60 >60 ml/min/1.104m2    GFR est non-AA >60 >60 ml/min/1.21m2    Calcium 8.7 8.5 - 10.1 MG/DL   CBC WITH AUTOMATED DIFF    Collection Time: 04/19/16  4:13 AM   Result Value Ref Range    WBC 11.5 (H) 4.5 - 10.8 K/uL    RBC 4.35 (L) 4.7 - 6.1 M/uL    HGB 13.9 13.5 - 17.5 g/dL    HCT 38.8 (L) 41 - 53 %    MCV 89.2 78 - 98 FL    MCH 32.0 (H) 27 - 31 PG    MCHC 35.8 31 - 37 g/dL    RDW 13.1 11.5 - 14.5 %    PLATELET 95 (L) 130 - 400 K/uL    MPV 10.0 5.9 - 10.3 FL    NEUTROPHILS 95 (H) 40 - 74 %    LYMPHOCYTES 2 (L) 19 - 48 %    MONOCYTES 3 3 - 9 %    EOSINOPHILS 0 0 - 5 %    BASOPHILS 0 0 - 2 %    ABS. NEUTROPHILS 10.8 (H) 1.5 - 8.0 K/UL    ABS. LYMPHOCYTES 0.3 (L) 0.8 - 3.5 K/UL    ABS. MONOCYTES 0.4 (L) 0.8 - 3.5 K/UL    ABS. EOSINOPHILS 0.0 0.0 - 0.5 K/UL    ABS. 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  Prior DVT that was provoked by L TKA in 2009 could probably have had [employer] DC'd at 3-6 months.  ACCP guidelines rec ~10 days of [place].  Pt comfortable w/ self administration of Lovenox.    Rockney Ghee, DO       Electronically signed by Rockney Ghee, DO at 04/19/2016  4:06 PM EDT

## (undated) NOTE — Progress Notes (Signed)
Formatting of this note might be different from the original.  Received to Observation. VSS Moves all extremities.  Denies pain or numbness.  Tolerating liquids.  Band-Aid D/I  Discharge instructions given.  DC to home via WC.  Electronically signed by Coralie Keens, RN at 03/31/2016 11:29 AM EDT

## (undated) NOTE — Telephone Encounter (Signed)
Formatting of this note might be different from the original.  No answer for follow up call   Electronically signed by Lavonia Dana, RN at 04/01/2016 11:56 AM EDT

## (undated) NOTE — H&P (Signed)
Formatting of this note is different from the original.  Images from the original note were not included.  >    Patient:    Gary White  Date of Birth:    1946/02/21  Date:                          04/14/2016 9:00 AM   Visit Type:                  Office Visit    This 76 year old male presents for right knee pain.    History of Present Illness:  1.  right knee pain   Severity level is 6.  The problem is changing in character.  Location: right knee.  The pain is aching and throbbing.  The pain is aggravated by bending and climbing stairs.  Associated symptoms include decreased mobility, joint tenderness, popping, swelling and weakness.       Past Medical History  (Reviewed, updated)    Disease Onset Date Comments   Blood clots     Osteoarthritis     Hypertension     Sleep apnea       Past Surgical History    Management Side Date Comments   knee right 06/2007    Cataract extraction      Knee replacement        Medications (active prior to today)    Medication Name Sig Desc Start Date Stop Date Refilled Elsewhere   tamsulosin ER 0.4 mg capsule,extended release 24 hr take 1 capsule by oral route  every day 1/2 hour following the same meal each day //   Y   warfarin 5 mg tablet take 1 tablet by oral route  every day //   Y   Medication Reconciliation  Medications reconciled today.    Allergies:  Ingredient Reaction Medication Name Comment   NO KNOWN ALLERGIES        Family History  (Reviewed, updated)    Relationship Family Member Name Deceased Age at Death Condition Onset Age Cause of Death       Family history of Osteoarthritis  N       Family history of Melanoma  N     SOCIAL HISTORY  (Reviewed, updated)  Smoking status: Former smoker.    CAFFEINE  The patient uses caffeine.  HOME ENVIRONMENT/SAFETY  The patient has not fallen in the last year.     REVIEW OF SYSTEMS  System Neg/Pos Details   Constitutional Negative Fatigue, fever and night sweats.   Eyes Negative Vision loss.   Respiratory Negative Cough and  dyspnea.   Cardio Negative Chest pain, cyanosis and irregular heartbeat/palpitations.   GI Negative Constipation, diarrhea, nausea and vomiting.   GU Negative Dysuria and hematuria.   Endocrine Negative Cold intolerance and heat intolerance.   Neuro Negative Difficulty walking, dizziness and headache.   Integumentary Positive Swelling.   Integumentary Negative Rash.   MS Positive Decreased mobility, Joint tenderness, Popping, Weakness.   MS Negative Except as noted in HPI and chief complaint.   Hema/Lymph Negative Easy bleeding and easy bruising.   Allergic/Immuno Negative Environmental allergies and food allergies.     VITAL SIGNS    HEIGHT  Time ft in cm Last Measured Height Position %   8:27 AM 5.0 3.00 160.02 01/12/2016       WEIGHT/BSA/BMI  Time lb oz kg Context % BMI kg/m2 BSA m2   8:27 AM 203.00  92.079   35.96      TEMPERATURE/PULSE/RESPIRATION  Time Temp F Temp C Temp Site Pulse/min Pattern Resp/ min   8:27 AM 97.9 36.61         MEASURED BY  Time Measured by   8:27 AM Verdene Lennert. Fouts     Physical Exam  Exam Findings Details   Knee ROM R * Active ROM - Flexion: 110 degrees, Extension: -5 degrees, Factors: pain, Description: Active painful ROM.   Respiratory Normal Inspection - Normal. Auscultation - Normal. Palpation - Normal. Percussion - Normal. Chest wall tenderness - Absent. Cough - Absent. Effort - Normal.   Cardiovascular Normal Inspection - JVD: Absent. Palpation/percussion - PMI normal. Heart rate - Regular rate. Rhythm - Regular. Heart sounds - Normal S1, Normal S2. Extra sounds - None. Murmurs - None. Extremities - No edema.   Abdomen * Obese.   Abdomen Normal Inspection - Normal. Anterior palpation - Normal. CVA tenderness - None. No abdominal tenderness.   Skin Normal Inspection - Normal. Palpation/texture - Normal. Nails - Normal. Hair - Normal.   Knee * Inspection - Gait: Limp. Alignment - Right: neutral. Ecchymosis - Right: negative. Effusion - Right: mild. Swelling - Right: mild. Maximum  tenderness - Right: Medial Joint Line. Patella exam - Crepitation - Right: mild. Patella position - Right: neutral.   Knee Normal Inspection - Atrophy - Right: Absent. Skin - Right: Normal. Patella exam - Apprehension - Right: Negative. Q-angle - Right: Normal. Posterior drawer - Right: Negative. Anterior drawer - Right: Negative.     Diagnostics:  Study Result/Report   X-RAY EXAM OF KNEE, 3 RT AP lateral and Lutricia Feil shows bone on bone lateral compartment arthritis with tibial sclerosis and osteophytes, patella femoral sclerosis and osteophytes     Completed Orders (this encounter)  Order Details Reason Side Interpretation Result Initial Treatment Date Region   X-RAY EXAM OF KNEE, 3 RT   RT  AP lateral and Lutricia Feil shows bone on bone lateral compartment arthritis with tibial sclerosis and osteophytes, patella femoral sclerosis and osteophytes     Giving encouragement to exercise          Lifestyle education regarding diet            Assessment/Plan  # Detail Type Description    1. 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 Treatment includes NSAIDs, Ibuprofen 800mg  and Diclofenac 75mg  x3 months without improvement. He tried steroid injection by Dr. Sabra Heck in June of this year that only lasted one week. He participated in physical therapy at [facility] without functional improvement. He has x-ray evidence on AP lateral and [place] bone on bone lateral compartment arthritis with tibial sclerosis and osteophytes, patella femoral sclerosis and osteophytes. He has -5 to 110 degrees ROM on exam. Personal safety is compromised because of the pain and instability, he is afraid of falling. ADLs are limited, he has difficulty walking, standing, bathing, driving and with stairs. He uses a brace as needed for support. Medical history is positive for co-morbid  condition of high blood pressure, sleep apnea, and DVT. Patient education was provided today regarding joint disease and treatment options. The risks, benefits and alternatives were explained and the patient agreed to proceed with right total knee arthroplasty. Rx given for a walker.    Plan Orders The patient  had the following test(s) completed today X-RAY EXAM OF KNEE, 3 RT.      2. Assessment Body mass index (BMI) 35.0-35.9, adult (Z68.35).    Plan Orders  Today's instructions / counseling include(s) Lifestyle education regarding diet. Giving encouragement to exercise.       Fall Risk Plan  The patient has not fallen in the last year.    Transcribed by Arcola Jansky authenticated by Riley Lam    Document generated by:  Bjorn Pippin 04/14/2016 12:42 PM  ----------------------------------------------------------------------------------------------------------------------------------------------------------------------      Electronically signed by Riley Lam, MD at 04/14/2016  1:14 PM EDT

## (undated) NOTE — Unmapped External Note (Signed)
Formatting of this note might be different from the original.  right femoral nerve block performed by dr Judie Grieve, procedure start at 1538 and end at 1541, tolerated well  Electronically signed by Lebron Conners at 04/18/2016  3:41 PM EDT

## (undated) NOTE — Progress Notes (Signed)
Formatting of this note might be different from the original.  Discharge complete. Patient discharged to home with home health  Discussed discharge instructions including diet, activity, and home medications.  Prescriptions delivered to room through meds to beds.  Spent significant amount of time explaining all new medications and side effects of each, and also purpose of taking and finishing them.   Discussed follow up appointments and reasons to call the doctor or return to the ER.  No questions or concerns.    Patient signed discharge instructions and medicare message.  Patient escorted out by [facility] staff by wheelchair.   Transportation home per private vehicle.   All of patients belongings left with patient.     Male  visitor at bedside during discharge instructions.    Electronically signed by Collene Mares at 04/20/2016  4:18 PM EDT

## (undated) NOTE — Progress Notes (Signed)
Formatting of this note might be different from the original.  Patient resting in bed, assessment complete, see flow sheet. No s/s of distress noted. Patient denies needs at this time. Call light in reach.   Electronically signed by Jenna Luo at 04/19/2016  7:06 PM EDT

## (undated) NOTE — Progress Notes (Signed)
Formatting of this note might be different from the original.  Patient room rounds completed  Denies needs at this time  No acute distress noted at this time    etusseyRN    Electronically signed by Aurora Mask T at 04/18/2016 11:28 PM EDT

## (undated) NOTE — Progress Notes (Signed)
Formatting of this note might be different from the original.  Estill Bamberg at [employer] notified that pt is bundled payment.  Electronically signed by Royetta Asal at 04/20/2016 12:50 PM EDT

## (undated) NOTE — Progress Notes (Signed)
Formatting of this note might be different from the original.  Charge nurse rounds completed, no needs or concerns at this time.   Electronically signed by Dolores Frame at 04/20/2016  9:04 AM EDT

## (undated) NOTE — Progress Notes (Signed)
Formatting of this note might be different from the original.  Sup to sit to stand I. Gait training with SW 150 feet SBA. Gait steady. No LOB noted. Heel strike noted. Performed standing ex heel lifts, SLR,marching and seated ex knee ext, hip flex, ankle pumps to increase strength and mobility. Call light in reach. Reviewed self assisted ROM ex including heel slides with sheet . ROM 0-95 degrees.   Electronically signed by Senaida Lange T at 04/20/2016  9:12 AM EDT

## (undated) NOTE — Progress Notes (Signed)
Formatting of this note is different from the original.  Lumbar Epidural Steroid Injection # [place]  [id number]  03/31/2016  Referring Provider: Jeronimo Greaves, DO    Pre-Procedure Diagnosis:   1. Lumbago   2. Lumbar stenosis, clinically   3. 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 MRI evidence of multilevel lumbar foraminal stenosis, worst at L4-5   5. 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 Right paramedian L3/L4  interlaminar epidural corticosteroid and local anesthetic injection   2. Intraspinal  myelography without dural puncture   3. Fluoroscopic guidance with interpretation of images    Complications:  None    Indication for Procedure:  Mobility and activity of daily living deficits secondary to low back and right lower limb pain.    Monitoring: Pulse oximetry, blood pressure, and three lead EKG.  The patient's vital signs remained stable throughout the entire procedure.    History: See progress notes. Patient denies any fever,progressive weakness or change in bowel or bladder function.    Pt here for lesi #1.  He complains of pain in low back that radiates down right leg to foot.     Current pain score: 4  Worst: 8  Least: 1    Current Medications: The patient is on no antibiotics. Any anticoagulants are held for the purpose of the procedure.   Current Outpatient Prescriptions on File Prior to Visit   Medication Sig Dispense Refill   ? fluticasone (FLONASE) 50 mcg/Actuation nasal spray Spray 1 Spray in nose Daily. (in each nostril) 1 Package QS 0   ? cetirizine (ZYRTEC) 10 mg tablet Take 1 Tab by mouth Daily. 30 Tab 0   ? Gabapentin (NEURONTIN) 600 mg tablet Take 1 Tab by mouth Three times a day. 90 Tab 0   ? cetirizine (ZYRTEC) 10 mg tablet Take 1 Tab by mouth Daily. 30 Tab 0   ? lisinopril (PRINIVIL, ZESTRIL) 20 mg tablet Take 20 mg by mouth Daily.     ? hydrocortisone 2.5 % lotion by Topical route Four times a day.      ? multivitamin (THERAGRAN) tablet Take 1 Tab by mouth Daily.     ? tamsulosin (FLOMAX) 0.4 mg capsule Take 0.4 mg by mouth Daily.     ? ibuprofen (MOTRIN) 400 mg tablet Take 400 mg by mouth Three times a day with meals.     ? warfarin (COUMADIN) 7.5 mg tablet Take 7.5 mg by mouth Daily.       No current facility-administered medications on file prior to visit.        Anesthesia:  No conscious sedation. Local needle tract anesthesia with a total of 4 cc of 1% preservative-free lidocaine.    Description of Procedure:  After written and verbal informed consent, the patient was placed prone on the table.  Betadine prep, sterile drape and technique was utilized.  Under fluoroscopic guidance, the L3/L4 interspace was identified. Local needle tract anesthesia was accomplished with a 1.25 inch, 27 gauge spinal needle. I then used a 3.5 inch 20 gauge Touhy epidural needle and advanced through the soft tissue to enter the L3/L4 interspace using a right paramedian approach. Good loss of resistance was obtained. Aspiration was negative for CSF or blood. I then injected 3 cc of Isovue 300 contrast and obtained an appropriate intraspinal myelogram. There was no evidence of intravascular or subarachnoid uptake.  The needle tip was confirmed in dorsal epidural space on AP and lateral views. I then injected a combination of 40 mg of Kenalog in 1 cc with 3 cc of 1% preservative-free lidocaine. There was good washout of contrast. The patient tolerated the procedure well.     The patient was then transferred to the Recovery Room area for further observation. He was discharged to home when medically stable. The patient was given post injection precautions.     Joseph Pierini, M.D.  Board Certified in Physical Medicine and Rehabilitation  Subspecialty Board Certification in Pain Medicine      Electronically signed by Joseph Pierini, MD at 03/31/2016 11:29 AM EDT

## (undated) NOTE — Addendum Note (Signed)
Addended by: Clemens Catholic on: 03/16/2016 12:07 PM      Modules accepted: Orders, SmartSet      Electronically signed by Depriest, Tiffany L at 03/16/2016 12:07 PM EDT

## (undated) NOTE — Discharge Summary (Signed)
Formatting of this note is different from the original.  Images from the original note were not included.      Discharge Summary    Patient: Gary White               Sex: male          DOA: 04/18/2016      Date of Birth:  06-05-1946      Age:  64 y.o.        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[place]) ICD-10-CM: I74.9  ICD-9-CM: 444.9  Unknown - Present      Benign non-nodular prostatic hyperplasia with lower urinary tract symptoms ICD-10-CM: N40.1  ICD-9-CM: 600.91  12/09/2014 - Present      Erectile dysfunction ICD-10-CM: N52.9  ICD-9-CM: 607.84  10/30/2013 - Present      Incomplete bladder emptying ICD-10-CM: R33.9  ICD-9-CM: 788.21  10/30/2013 - Present      Anticoagulated on Coumadin ICD-10-CM: Z51.81, Z79.01  ICD-9-CM: V58.83, V58.61  05/14/2013 - Present      Dyslipidemia ICD-10-CM: E78.5  ICD-9-CM: 272.4  05/14/2013 - Present      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  He has tolerated physical therapy well, his pain is well controlled.  He has been followed by the hospitalist for medical management.  He will be discharged to home health today on Roxycodone 10 for pain and Lovenox for DVT prophylaxis.    Consults: Hospitalist    Significant  Diagnostic Studies: labs:     Discharge Medications:     Current Discharge Medication List     START taking these medications    Details   enoxaparin (LOVENOX) injection 140 mg by SubCUTAneous route once for 1 dose.  Qty: 5 Syringe, Refills: 0     oxyCODONE IR (ROXICODONE) 10 mg tab immediate release tablet Take 1 Tab by mouth every three (3) hours as needed. Max Daily Amount: 80 mg.  Qty: 60 Tab, Refills: 0       CONTINUE these medications which have NOT CHANGED    Details   cpap machine kit      diclofenac EC (VOLTAREN) 75 mg EC tablet Take 75 mg by mouth two (2) times a day.     tamsulosin (FLOMAX) 0.4 mg capsule Take 1 Cap by mouth daily.  Qty: 90 Cap, Refills: 3     hydrocortisone valerate (WESTCORT) 0.2 % topical cream Apply  to affected area two (2) times a day. use thin layer  testosterone 75 mg pllt by Implant route.     !! warfarin (COUMADIN) 5 mg tablet TAKE AS DIRECTED  Qty: 90 Tab, Refills: 3     !! warfarin (COUMADIN) 1 mg tablet As directed  Qty: 180 Tab, Refills: 2    Associated Diagnoses: Anticoagulated on Coumadin     multivitamin (ONE A DAY) tablet Take 0.5 Tabs by mouth two (2) times a day.     lisinopril (PRINIVIL, ZESTRIL) 10 mg tablet Take 10 mg by mouth daily.      !! - Potential duplicate medications found. Please discuss with provider.       Activity: Activity as tolerated and No driving while on analgesics    Diet: Regular Diet    Wound Care: As directed    Follow-up: 4 weeks    Jonelle Sidle, PA  04/20/2016  3:28 PM  Electronically signed by Riley Lam, MD at 04/20/2016  3:46 PM EDT

## (undated) NOTE — Progress Notes (Signed)
Formatting of this note might be different from the original.  Physical Therapy Initial Evaluation:    Orders reviewed, chart reviewed, and initial evaluation completed on [employer].    Chief complaint: R TKA  Date of onset: 04/18/16  Pre-Morbid functionality: lives at home    Patient presents at an overall level of assistance of moderate assistance with basic functional mobility. Results of the evaluation consist of: Patient supine in bed with active ankle pumps and negative Homan's sign.  Patient advised that leg might not be able to hold patient if patient has had a fem block.  Patient understands.  Patient required mod asst for supine to sit and sit to stand at walker.  Patient ambulated 3 steps to bedside chair with walker and mod asst.  Knee did not buckle, but patient supported body with with arms on walker.Knee immobilizer in place and knee continued to buckle even with KI on.   Patient up in good condition.      Pain Screen Pain Scale 1: Numeric (0 - 10), Pain Intensity 1: 4, Patient Stated Pain Goal: 4     Transfers Sit to Stand: Moderate assistance, Stand to Sit: Moderate assistance, Bed to Chair: Moderate assistance   Bed Mobility Supine to Sit: Moderate assistance, Sit to Supine: Moderate assistance       Based on this evaluation, the patient will benefit from physicial therapy service twice daily for duration of hospital stay toward the achievement of set goals. Patient demonstrates the following rehabilitation potential towards the goals: very likely to progress    Goals: improve functional mobility, improve range of motion, improve strength, increased bed mobility and increased gait. Patient to be independent with supine to sit transfers.  Patient to be independent with sit to stand transfers at a walker.  Patient to ambulate 150 feet with walker & SBA.  Patient to have knee ROM of 0-90 degrees.    Treatment Plan: bed mobility, gait training, range of motion: active/assisted/passive,  therapeutic exercise/strengthening and transfer training    The goals and plan of care have been discussed with the patient. The patient agree to work toward stated goal(s). Projected outcome possibility isfunctional recovery.    Thanks for the consult.  Therapist: Tobey Grim, PT    Electronically signed by Tobey Grim at 04/19/2016  8:44 AM EDT

## (undated) NOTE — Progress Notes (Signed)
Formatting of this note might be different from the original.  Rounding on patient for cont. Care. No s/s of distress, denies any needs at this time. Call light within reach.   Electronically signed by Jenna Luo at 04/19/2016  1:21 AM EDT

## (undated) NOTE — Unmapped External Note (Signed)
Formatting of this note might be different from the original.  Name, date of birth, procedure verified with patient in PAT.    Electronically signed by Maxie Better at 04/14/2016 10:25 AM EDT

## (undated) NOTE — Progress Notes (Signed)
Formatting of this note is different from the original.  Progress Note    Patient: Gary White               Sex: male          MRN: [id number]      Date of Birth:  1945/07/15      Age:  31 y.o.                    Subjective:   Doing well.  Anticipates DC today.    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  No other personal or family hx of clots.    Objective:     Visit Vitals   ? BP 136/87 (BP 1 Location: Right arm, BP Patient Position: Sitting)   ? Pulse 80   ? Temp 98.3 F (36.8 C)   ? Resp 18   ? Ht 5\' 10"  (1.778 m)   ? Wt 92.5 kg (204 lb)   ? SpO2 99%   ? BMI 29.27 kg/m2     Physical Exam  Gen: aa, nad  Pulm: ctab  Heart: s1s2 rrr no rmg  Abd: s/nt/nd/bs+  Eyes anicteric  Neuro no tremor  Psych no psychomotor agitation  Skin no rash  MS - rt knee bandaged in surgical dressing.    LABS:  Recent Results (from the past 24 hour(s))   CBC WITH AUTOMATED DIFF    Collection Time: 04/19/16  4:29 PM   Result Value Ref Range    WBC 11.5 (H) 4.5 - 10.8 K/uL    RBC 4.02 (L) 4.7 - 6.1 M/uL    HGB 13.4 (L) 13.5 - 17.5 g/dL    HCT 37.7 (L) 41 - 53 %    MCV 93.8 80 - 100 FL    MCH 33.3 (H) 27 - 31 PG    MCHC 35.5 31 - 37 g/dL    RDW 13.4 11.5 - 14.5 %    PLATELET 100 (L) 130 - 400 K/uL    MPV 9.7 5.9 - 10.3 FL    NEUTROPHILS 85 (H) 40 - 74 %    LYMPHOCYTES 7 (L) 19 - 48 %    MONOCYTES 8 3 - 9 %    EOSINOPHILS 0 0 - 5 %    BASOPHILS 0 0 - 2 %    ABS. NEUTROPHILS 9.7 (H) 1.5 - 8.0 K/UL    ABS. LYMPHOCYTES 0.8 0.8 - 3.5 K/UL    ABS. MONOCYTES 0.9 0.8 - 3.5 K/UL    ABS. EOSINOPHILS 0.0 0.0 - 0.5 K/UL    ABS. BASOPHILS 0.0 0.0 - 0.1 K/UL    DF AUTOMATED      IMMATURE GRANULOCYTES 0 (L) 2 - 10 %    ABS. IMM. GRANS. 0.0 K/UL   PROTHROMBIN TIME + INR    Collection Time: 04/19/16  4:29 PM   Result Value Ref Range    INR 1.2 (H) 0.9 - 1.1      Prothrombin time 15.6 (H) 11.4 - 15.1 sec   CBC WITH AUTOMATED DIFF    Collection Time: 04/20/16  5:41 AM   Result Value Ref Range    WBC 7.3 4.5 -  10.8 K/uL    RBC 4.00 (L) 4.7 - 6.1 M/uL    HGB 12.8 (L) 13.5 - 17.5 g/dL    HCT 37.8 (L) 41 - 53 %    MCV 94.5 80 -  100 FL    MCH 32.0 (H) 27 - 31 PG    MCHC 33.9 31 - 37 g/dL    RDW 13.4 11.5 - 14.5 %    PLATELET 95 (L) 130 - 400 K/uL    MPV 10.0 5.9 - 10.3 FL    NEUTROPHILS 79 (H) 40 - 74 %    LYMPHOCYTES 11 (L) 19 - 48 %    MONOCYTES 8 3 - 9 %    EOSINOPHILS 2 0 - 5 %    BASOPHILS 0 0 - 2 %    ABS. NEUTROPHILS 5.8 1.5 - 8.0 K/UL    ABS. LYMPHOCYTES 0.8 0.8 - 3.5 K/UL    ABS. MONOCYTES 0.6 (L) 0.8 - 3.5 K/UL    ABS. EOSINOPHILS 0.1 0.0 - 0.5 K/UL    ABS. BASOPHILS 0.0 0.0 - 0.1 K/UL    DF AUTOMATED      IMMATURE GRANULOCYTES 0 (L) 2 - 10 %    ABS. IMM. GRANS. 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  ACCP guidelines rec ~10 days of [place].  Pt comfortable w/ self administration of Lovenox as bridging therapy to longer term Coumadin.  Rx for Lovenox 1.5mg / kg SQ daily written and left in DC navigator.  Duration of ongoing Coumadin to be discussed w/ PCP who may have knowledge of clotting risks that I'm not aware of.    Rockney Ghee, DO       Electronically signed by Rockney Ghee, DO at 04/20/2016 10:07 AM EDT

## (undated) NOTE — Progress Notes (Signed)
Formatting of this note might be different from the original.  Late entry from 41am   Pt is a participant in the Medicare Good [employer] ACO program. Spoke with pt re: purpose of Good Help ACO program and explained that it is a program that Medicare assigned him to as an extra resource for services, questions, etc. Also informed pt he would be contacted via telephone by Good Help nurse post DC to offer assistance/provide support. Additionally, discussed with pt the need for PCP follow up within 7 days of hospital DC    Electronically signed by Royetta Asal at 04/19/2016  3:47 PM EDT

## (undated) NOTE — Progress Notes (Signed)
Formatting of this note might be different from the original.  Pt resting in chair, no complaints of pain at this time, wheels in locked position. Will continue to monitor.   Electronically signed by Ronald Pippins, RN at 04/19/2016  1:39 PM EDT

## (undated) NOTE — Unmapped External Note (Signed)
Formatting of this note might be different from the original.  Spinal site band- aid clean, dry, intact.  Electronically signed by Cleophus Molt at 04/18/2016  5:31 PM EDT

## (undated) NOTE — Progress Notes (Signed)
Formatting of this note might be different from the original.  0600 Report received from [facility] on Mr. Amedee. Patient resting in bed with no apparent signs of distress noted. White board updated. Call light within reach.     0610 Shift assessment done at this time and documented in flow sheet.     0800 Patient sitting up in chair with no needs voiced at this time. Call light within reach.     1000 Patient sitting up in the chair with no needs voiced at this time. Morning meds and pain medication given per request. Call light within reach.     1200 Patient sitting up in the chair with no needs voiced at this time. Call light within reach.     1400 Patient sitting up in chair with no needs voiced at this time. Wants to go home. Dressed and ready to go. Call light within reach.   Electronically signed by Hulan Fray E at 04/20/2016  3:57 PM EDT

## (undated) NOTE — Addendum Note (Signed)
Addended by: Clemens Catholic on: 03/16/2016 10:46 AM      Modules accepted: Orders      Electronically signed by Depriest, Tiffany L at 03/16/2016 10:46 AM EDT

## (undated) NOTE — Progress Notes (Signed)
Formatting of this note might be different from the original.  41 Pt is ACO member and will need appointment to follow up with PCP within 7 days of discharge.     Chart screened for discharge planning needs per Case Management protocol. Based on the information currently available in medical record,  care manager has been assigned and will follow for KNEE ARTHROPLASTY TOTAL RIGHT  Electronically signed by Royetta Asal at 04/19/2016  7:20 AM EDT

## (undated) NOTE — Progress Notes (Signed)
Formatting of this note might be different from the original.  Bedside, Verbal and Written shift change report received from [place]  Engineer, drilling). Report included the following information Kardex, MAR, Recent Results and Med Rec Status.   Electronically signed by Ronald Pippins, RN at 04/19/2016  6:47 AM EDT

## (undated) NOTE — Unmapped External Note (Signed)
Formatting of this note is different from the original.    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]    Procedure(s):  KNEE ARTHROPLASTY TOTAL RIGHT   Surgeon(s) and Role:     * Riley Lam, MD - Primary    Assistant Staff:  Physician Assistant: Jonelle Sidle, PA    Surgical Staff:  Circ-1: Maxine Glenn, RN  Physician Assistant: Jonelle Sidle, PA  Scrub Tech-1: Candy L Coomer; Eliseo Squires  Event Time In   Incision Start 1626   Incision Close 1657     Anesthesia: Spinal   Estimated Blood Loss: 20  Specimens:   ID Type Source Tests Collected by Time Destination   1 : Right Knee Bone chips  Fresh Knee   Riley Lam, MD 04/18/2016 1631 Pathology   1 : cath urine  Urine Cath Urine  Riley Lam, MD 04/18/2016 1631 Microbiology     Findings: severe tricompartmental oa   Complications: none  Implants:   Implant Name Type Inv. Item Serial No. Manufacturer Lot No. LRB No. Used Action   CEMENT BNE HV R 40GM -- PALACOS - [id number]  CEMENT BNE HV R 40GM -- PALACOS  [employer] [id number] Right 2 Implanted   PAT SER A STD 34X8.5 3 PEG -- NO WIRE - [id number]  PAT SER A STD 34X8.5 3 PEG -- NO WIRE  [employer] [id number] Right 1 Implanted   TRAY TIB I-BEAM FIX 79MM COCR --  - [id number]  TRAY TIB I-BEAM FIX 79MM COCR --   [employer] [id number] Right 1 Implanted   FEM RET CRUC INTLOK RT 70MM -- VANGUARD [id number]  FEM RET CRUC INTLOK RT 70MM -- VANGUARD  [employer] [id number] Right 1 Implanted   INSERT TIB BEAR CR 16X79/83MM -- Lindell Noe [id number]   INSERT TIB BEAR CR 16X79/83MM -- [employer] [id number] Right 1 Implanted       Electronically signed by Riley Lam, MD at 04/18/2016  5:07 PM EDT

---

## 2007-07-29 HISTORY — PX: HX KNEE REPLACMENT: SHX125

## 2008-12-09 DIAGNOSIS — G629 Polyneuropathy, unspecified: Secondary | ICD-10-CM | POA: Insufficient documentation

## 2009-05-08 ENCOUNTER — Emergency Department (HOSPITAL_COMMUNITY): Payer: Self-pay | Admitting: EXTERNAL

## 2011-10-18 MED ORDER — AZITHROMYCIN 250 MG TAB
250 mg | ORAL_TABLET | ORAL | Status: DC
Start: 2011-10-18 — End: 2011-12-20

## 2011-10-18 MED ORDER — METHYLPREDNISOLONE 80 MG/ML SUSP FOR INJECTION
80 mg/mL | Freq: Once | INTRAMUSCULAR | Status: AC
Start: 2011-10-18 — End: 2011-10-18

## 2011-10-18 MED ORDER — BENZONATATE 200 MG CAP
200 mg | ORAL_CAPSULE | Freq: Three times a day (TID) | ORAL | Status: AC | PRN
Start: 2011-10-18 — End: 2011-10-25

## 2011-10-19 LAB — PROTHROMBIN TIME + INR
INR: 1.4 — ABNORMAL HIGH (ref 0.9–1.1)
Prothrombin time: 16.8 s — ABNORMAL HIGH (ref 11.3–14.6)

## 2011-10-19 NOTE — Progress Notes (Signed)
Quick Note:    Please call pt and let him know INR was only 1.4 - tell him after he is finished with the medications to stop in here without a visit before he leaves for [place] and have his PT/INR checked    Donnella Bi, DO    ______

## 2011-10-21 NOTE — Progress Notes (Signed)
HISTORY OF PRESENT ILLNESS  Julian Bailey is a 66 y.o. male.    has a past medical history of Hypertension; Arthritis; BPH (benign prostatic hyperplasia); and DVT (deep venous thrombosis) (2009 ).    Chief Complaint   Patient presents with   ??? Establish Care   ??? Sinus Infection   ??? Cough   ??? Sore Throat         HPI  Pt presents to establish care for the above PMH, including taking over his coumadin that was being monitored by prior PCP at [facility] in [place], [place]. Pt was asked to sign record release to get prior records from that PCP.   Pt has recently moved to the area an would like to not have to drive an hour and a half to see a physician.  Pt is also here for an acute upper respiratory condition.   Patient complains of symptoms of a URI, possible sinusitis. Symptoms include plugged sensation in bilateral ear, congestion, swollen glands and cough. Onset of symptoms was a few weeks ago, gradually worsening since that time. Patient also c/o congestion, cough described as without wheezing, dyspnea or hemoptysis, productive of clear sputum, worsening over time, post nasal drip, purulent nasal discharge and sinus pressure for the past sevferal days .   Evaluation to date: none. Treatment to date: antihistamines, which have been not very effective.Patient does not smoke cigarettes     Anticoagulation  Patient here for followup of chronic anticoagulation.  Indication: DVT  Bleeding Signs/Symptoms:  None  Thromboembolic Signs/Symptoms:  None  Missed Coumadin Doses:  None  Medication Changes:  No- there will be today   Dietary Changes:  no  Bacterial/Viral Infection:  yes - see above HPI   Coumadin Flowsheet Values Recorded Today:     Lab Results   Component Value Date/Time    INR 1.4 10/18/2011  3:55 PM    Prothrombin time 16.8 10/18/2011  3:55 PM                   ROS  Past Medical History   Diagnosis Date   ??? Hypertension    ??? Arthritis    ??? BPH (benign prostatic hyperplasia)    ??? DVT (deep venous thrombosis) 2009       after knee surgery      Past Surgical History   Procedure Date   ??? 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 Hx endoscopy      colonoscopy last was in 2008      Family History   Problem Relation Age of Onset   ??? Arthritis-osteo Mother    ??? Hypertension Mother    ??? Arthritis-osteo Father    ??? Arthritis-osteo Brother    ??? Cancer Paternal Uncle      unknown   ??? Cancer Paternal Uncle      stomach   ??? Diabetes Paternal Uncle      History     Social History   ??? Marital Status: SINGLE     Spouse Name: N/A     Number of Children: N/A   ??? Years of Education: N/A     Occupational History   ??? Not on file.     Social History Main Topics   ??? Smoking status: Former Smoker     Quit date: 11/30/1984   ??? Smokeless tobacco: Not on file   ??? Alcohol Use: Yes      3 glasses of wine "  couple of drinks"    ??? Drug Use: No   ??? Sexually Active: Yes -- Male partner(s)     Other Topics Concern   ??? Blood Transfusions No     Social History Narrative   ??? No narrative on file     No Known Allergies  No current outpatient prescriptions on file prior to visit.       Filed Vitals:    10/18/11 1433   BP: 124/80   Pulse: 119   Temp: 99.8 ??F (37.7 ??C)   TempSrc: Oral   Resp: 18   Height: 5\' 10"  (1.778 m)   Weight: 218 lb (98.884 kg)   SpO2: 97%   PainSc:   0 - No pain   .  Body mass index is 31.28 kg/(m^2).          Physical Exam    Nursing note and vitals reviewed.   Constitutional: he is oriented to person, place, and time. She appears well-developed and well-nourished. No distress.   HENT:   Head: Normocephalic and atraumatic.   Right and Left  Ear: External ears normal.   Nose: Mucosal edema and sinus tenderness present. Right sinus exhibits maxillary sinus tenderness and frontal sinus tenderness. Left sinus exhibits maxillary sinus tenderness and frontal sinus tenderness.   Mouth/Throat: Mucous membranes are normal. Posterior oropharyngeal erythema present.     Eyes: Conjunctivae and EOM are normal. Pupils are equal, round, and reactive to  light. No scleral icterus.   Neck: Normal range of motion. Neck supple. No JVD present. No tracheal deviation present.   Cardiovascular: Normal rate, regular rhythm, normal heart sounds and intact distal pulses. Exam reveals no gallop and no friction rub.   No murmur heard.   Pulmonary/Chest: Effort normal and breath sounds normal. No respiratory distress.   Abdominal: Soft. Bowel sounds are normal. She exhibits no distension and no mass. There is no tenderness.   Musculoskeletal: Normal range of motion. She exhibits no edema.   Lymphadenopathy:   She has no cervical adenopathy.   Neurological: he is alert and oriented to person, place, and time.   Skin: Skin is warm and dry.   Psychiatric: he has a normal mood and affect. behavior is normal.       Lab Results   Component Value Date/Time    INR 1.4 10/18/2011  3:55 PM    Prothrombin time 16.8 10/18/2011  3:55 PM       ASSESSMENT and PLAN  1. Anticoagulated on Coumadin  AMB POC PT/INR, PROTHROMBIN TIME  Because the below medications usually raises PT/INR i will wait to adjust medication after completion of medication; pt has been asked to come in for PT/INR after Z-pak is complete      2. Acute sinus infection  METHYLPREDNISOLONE ACETATE INJECTION 80 MG, PR THER/PROPH/DIAG INJECTION, SUBCUT/IM, methylPREDNISolone acetate (DEPO-MEDROL) 80 mg/mL injection, azithromycin (ZITHROMAX) 250 mg tablet, benzonatate (TESSALON) 200 mg capsule     Follow-up Disposition: 2 weeks for office visit   1 week PT/INR then dose adjustment and fasting labs   lab results and schedule of future lab studies reviewed with patient  reviewed diet, exercise and weight control  reviewed medications and side effects in detail    I have discussed the above plan with the patient. Patient has also been given instructions and information on his conditions.  Patient is aware of all possible side effects and agrees with the above mentioned plan.       Donnella Bi, DO

## 2011-11-08 NOTE — Progress Notes (Signed)
Anticoagulation  Patient here for followup of chronic anticoagulation.  Indication: DVT  Bleeding Signs/Symptoms:  None  Thromboembolic Signs/Symptoms:  None  Missed Coumadin Doses:  None  Medication Changes:  yes - pt is alternating 6 mg TTF and then 5 mg the other days   Dietary Changes:  no  Bacterial/Viral Infection:  No  Coumadin Flowsheet Values Recorded Today:    INR was 2.8     1. Encounter for long-term (current) use of anticoagulants  AMB POC PT/INR     Continue current dose  Keep regular appointment   Recheck in 4 weeks    Brittnei Jagiello Samara Deist, DO

## 2011-11-11 LAB — AMB POC PT/INR: INR POC: 2.8

## 2011-11-13 NOTE — Progress Notes (Signed)
Quick Note:    Please let pt know that we have still not received the records from his prior PCP  Can we see if he is able to call and get it expiated, if not pt can come in for blood work and we can start from scratch  Donnella Bi, DO    ______

## 2011-11-14 NOTE — Progress Notes (Signed)
Quick Note:    Patient contacted and he said he will call his prior PCP and see what he can do about the records. If he can't get them expiated then he will call and let you know so you could order blood work and start from scratch.  ______

## 2011-12-20 LAB — AMB POC PT/INR: INR POC: 5.4

## 2011-12-20 MED ORDER — TRIAMCINOLONE ACETONIDE 0.5 % OINTMENT
0.5 % | Freq: Two times a day (BID) | CUTANEOUS | Status: DC
Start: 2011-12-20 — End: 2012-02-21

## 2011-12-20 MED ORDER — VARDENAFIL 10 MG TABLET
10 mg | ORAL_TABLET | ORAL | Status: DC | PRN
Start: 2011-12-20 — End: 2015-11-26

## 2011-12-20 NOTE — Progress Notes (Signed)
..  [place] of left lower venous extremity completed 3:11 PM by Rutherford Limerick, RDMS, RVT, RT(R)

## 2011-12-25 NOTE — Progress Notes (Signed)
HISTORY OF PRESENT ILLNESS  Julian Bailey is a 66 y.o. male.    has a past medical history of Hypertension; Arthritis; BPH (benign prostatic hyperplasia); DVT (deep venous thrombosis) (x 2 2009  and 2011 ); and ED (erectile dysfunction).    Chief Complaint   Patient presents with   ??? Rash     all over been there for years   ??? Anticoagulation     alternates 5 mg and 6 mg yesterday took 6mg     ??? Leg Swelling     Left lower extremity x 1 week;  hx of DVT   ??? Erectile Dysfunction     needs refills of levitra   ??? Hypertension     needs regular blood work          HPI  The patient is a 66 year old white male with the above listed past medical history, who presents today to go over the above chief complaints:   1.  Rash.  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  The plaques are excoriated and the largest being on his right lower extremity is approximately 11 cm x 5 cm.  He states that this is a long-standing condition and has been on multiple steroid creams and antifungals without any relief.  He states that the Triamcinolone cream that he was on at one point did the best at "softening it" but nothing has ever resolved the problem.  The patient has seen multiple physicians and dermatologists for this condition but states that he has never told exactly what it was.  I asked him if he how he knew they looked like plaque and told it was eczema and he states "that is what Dr. Timoteo Ace told me."  The summer seems to make it worse.     2.  The patient presents with left lower extremity leg swelling with a history of multiple DVTs in the past. The patient states that he began having left lower extremity pain approximately a week ago.  He states that he does not remember what caused the onset, but denies long travel in the car.  He has associated pain with palpation of his lower extremity.  There is increased redness and warmth.      3. The patient is  here for refills on his Levitra.  The patient has been treated for erectile dysfunction for several years by his prior PCP, and states this allows  Him to  can achieve an erection when needed.  He denies any chest pain or shortness of breath.  He denies any side effect of the medication and requests a refill today.    4.  Hypertension.  The patient presents to follow up for hypertension.  He does not check blood pressure at home.  Discussed with the patient that if he has the ability to I would like for him to start keeping a blood pressure log.  Discussed the patient is trying to stay on a low salt diet and eat an overall low calorie whole grain diet.  He has no symptoms referable to hypertension including headache, chest pain, shortness of breath or stroke like symptoms.      Anticoagulation  Patient here for followup of chronic anticoagulation.  Indication: DVT x 2   Bleeding Signs/Symptoms:  None  Thromboembolic Signs/Symptoms:  Yes - see above   Missed Coumadin Doses:  None  Medication Changes:  no  Dietary Changes:  no  Bacterial/Viral Infection:  no  Coumadin Flowsheet Values Recorded Today:    Lab Results   Component Value Date/Time    INR POC 2.4 12/20/2011  2:09 PM    INR POC 2.8 11/11/2011 10:28 AM    INR 1.4 10/18/2011  3:55 PM    Prothrombin time 16.8 10/18/2011  3:55 PM             Review of Systems   Constitutional: Positive for malaise/fatigue. Negative for fever, chills, weight loss and diaphoresis.   HENT: Negative for congestion and sore throat.    Eyes: Positive for blurred vision. Negative for photophobia.        Needs referral for cataract removed    Respiratory: Negative for cough and shortness of breath.    Cardiovascular: Positive for leg swelling. Negative for chest pain and palpitations.   Gastrointestinal: Negative for heartburn, nausea, vomiting, abdominal pain, diarrhea, constipation, blood in stool and melena.   Genitourinary: Negative for dysuria.        ED   Musculoskeletal: Positive for  myalgias.   Skin: Positive for itching and rash.   Neurological: Negative for dizziness, loss of consciousness, weakness and headaches.   Psychiatric/Behavioral: Negative.  Negative for depression, suicidal ideas, hallucinations, memory loss and substance abuse. The patient is not nervous/anxious and does not have insomnia.      Past Medical History   Diagnosis Date   ??? Hypertension    ??? Arthritis    ??? BPH (benign prostatic hyperplasia)    ??? DVT (deep venous thrombosis) x 2 2009  and 2011      after knee surgery then after long car ride    ??? ED (erectile dysfunction)      Past Surgical History   Procedure Date   ??? 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 Hx endoscopy      colonoscopy last was in 2008      Family History   Problem Relation Age of Onset   ??? Arthritis-osteo Mother    ??? Hypertension Mother    ??? Arthritis-osteo Father    ??? Arthritis-osteo Brother    ??? Cancer Paternal Uncle      unknown   ??? Cancer Paternal Uncle      stomach   ??? Diabetes Paternal Uncle      History     Social History   ??? Marital Status: SINGLE     Spouse Name: N/A     Number of Children: N/A   ??? Years of Education: N/A     Occupational History   ??? Not on file.     Social History Main Topics   ??? Smoking status: Former Smoker     Quit date: 11/30/1984   ??? Smokeless tobacco: Not on file   ??? Alcohol Use: Yes      3 glasses of wine "couple of drinks"    ??? Drug Use: No   ??? Sexually Active: Yes -- Male partner(s)     Other Topics Concern   ??? Blood Transfusions No     Social History Narrative   ??? No narrative on file     No Known Allergies  Current Outpatient Prescriptions on File Prior to Visit   Medication Sig Dispense Refill   ??? warfarin (COUMADIN) 5 mg tablet Take 5 mg by mouth daily.       ??? lisinopril-hydrochlorothiazide (PRINZIDE, ZESTORETIC) 20-12.5 mg per tablet Take  by mouth daily.       ???  multivitamin (ONE A DAY) tablet Take 1 Tab by mouth daily.           Filed Vitals:    12/20/11 1403   BP: 120/80   Pulse: 98   Temp: 98.6 ??F  (37 ??C)   Resp: 16   SpO2: 100%   .  There is no height or weight on file to calculate BMI.          Physical Exam   Nursing note and vitals reviewed.  Constitutional: He is oriented to person, place, and time. He appears well-developed and well-nourished. No distress.   HENT:   Head: Normocephalic and atraumatic.   Right Ear: External ear normal.   Left Ear: External ear normal.   Nose: Nose normal.   Mouth/Throat: Oropharynx is clear and moist. No oropharyngeal exudate.   Eyes: Conjunctivae are normal. Pupils are equal, round, and reactive to light. No scleral icterus.   Neck: Normal range of motion. Neck supple. No JVD present. No tracheal deviation present.   Cardiovascular: Normal rate, regular rhythm, normal heart sounds and intact distal pulses.  Exam reveals no gallop and no friction rub.    No murmur heard.  Pulmonary/Chest: Effort normal and breath sounds normal. No respiratory distress.   Abdominal: Soft. Bowel sounds are normal. He exhibits no distension and no mass. There is no tenderness.   Musculoskeletal: Normal range of motion. He exhibits edema.        Left Lower extremity edema  +bilateral hemosiderin pigmentation  There is increased redness in L>R  +2 edema in L but trace in right  +homans    Lymphadenopathy:     He has no cervical adenopathy.   Neurological: He is alert and oriented to person, place, and time.   Skin: Skin is warm and dry. Rash noted. There is erythema.   Psychiatric: He has a normal mood and affect. His behavior is normal.     Lab Results   Component Value Date/Time    INR POC 2.4 12/20/2011  2:09 PM    INR POC 2.8 11/11/2011 10:28 AM    INR 1.4 10/18/2011  3:55 PM    Prothrombin time 16.8 10/18/2011  3:55 PM       ASSESSMENT and PLAN  1. Rash  METABOLIC PANEL, COMPREHENSIVE, TSH, 3RD GENERATION, REFERRAL TO DERMATOLOGY, triamcinolone acetonide (KENALOG) 0.5 % ointment   2. Anticoagulated on Coumadin  AMB POC PT/INR, CBC WITH AUTOMATED DIFF   3. Screening for cholesterol level   LIPID PANEL   4. Special screening for malignant neoplasm of prostate  PSA SCREENING (SCREENING)   5. Fatigue  TSH, 3RD GENERATION   6. DVT (deep venous thrombosis)  METABOLIC PANEL, COMPREHENSIVE, TSH, 3RD GENERATION, US DUPLEX LOWER EXT VENOUS LEFT   7. Leg swelling  US DUPLEX LOWER EXT VENOUS LEFT  Will coordinate care and admit to hospital or send to ED if positive for blood clot  If swelling doesnt resolve follow-up next week   8. ED (erectile dysfunction)  vardenafil (LEVITRA) 10 mg tablet   9. Cataract  REFERRAL TO OPHTHALMOLOGY   10. HTN (hypertension)  CBC WITH AUTOMATED DIFF, METABOLIC PANEL, COMPREHENSIVE Discussed with patient in detail about the need for compliance to diet that is appropriate for the patients above mentioned medical conditions. Consultation with dietitian was offered.  Also discussed in detail an exercise regimen that is appropriate for this patient.       Follow-up Disposition:  Return in about 6 months (around 06/20/2012).  lab results and schedule of future lab studies reviewed with patient  reviewed diet, exercise and weight control  cardiovascular risk and specific lipid/LDL goals reviewed  reviewed medications and side effects in detail  use of aspirin to prevent MI and TIA's discussed      I have. Reviewed with patient the treatment plan, goals of treatment plan, and limitations of treatment plan, to include the potential for side effects from medications. If side effects occur, it is the responsibility of the patient to inform the clinic so that a change in the treatment plan can be made in a safe manner.  The patient is informed an emergency room evaluation may be necessary if this occurs  Pt voices understanding and verbally agrees.      Donnella Bi, DO    Discussed pathophysiology of illness, risks versus benefits of treatments,  & possible complications. Questions answered & treatment as per orders. Total time = 30 minutes with over 50% being counseling & co-ordination  of care.

## 2011-12-28 NOTE — Progress Notes (Signed)
All info faxed to Dr Faye Ramsay

## 2012-01-10 ENCOUNTER — Encounter

## 2012-01-10 LAB — AMB POC PT/INR: INR POC: 4.8

## 2012-01-10 NOTE — Progress Notes (Signed)
2 D Adult Echo completed. Krystal Rivers-Ludgood, RDCS

## 2012-01-11 LAB — AMB POC PT/INR: INR POC: 1.8

## 2012-01-11 MED ORDER — SILVER NITRATE APPLICATORS
Freq: Once | CUTANEOUS | Status: AC
Start: 2012-01-11 — End: 2012-01-11

## 2012-01-11 NOTE — Progress Notes (Signed)
HISTORY OF PRESENT ILLNESS  Julian Bailey is a 66 y.o. male.    has a past medical history of Hypertension; Arthritis; BPH (benign prostatic hyperplasia); DVT (deep venous thrombosis) (x 2 2009  and 2011 ); and ED (erectile dysfunction).    Chief Complaint   Patient presents with   ??? Bleeding/Bruising     Cut lip shaving annticoagulated on coumadin          HPI  Pt presents to the office after cutting himself shaving approximately 48 hours ago.  Pt states that it has bleed intermittently since that time. He denies any SOB, dizziness, or chest pain.   His PT/INR was checked and supra therapeutic at 5.4.      Patient here for followup of chronic anticoagulation.  Indication: DVT  Bleeding Signs/Symptoms:  Yes - see above   Thromboembolic Signs/Symptoms:  None  Missed Coumadin Doses:  None  Medication Changes:  no  Dietary Changes:  yes - "has not eaten any green leafy salads for a while- I have been trying to be better with my diet."   Bacterial/Viral Infection:  no    Medication Dosage: 5 mg daily x several years         ROS  A thorough 10 point review of systems was done and was unremarkable except for HPI and chronic medical conditions.     Past Medical History   Diagnosis Date   ??? Hypertension    ??? Arthritis    ??? BPH (benign prostatic hyperplasia)    ??? DVT (deep venous thrombosis) x 2 2009  and 2011      after knee surgery then after long car ride    ??? ED (erectile dysfunction)      Past Surgical History   Procedure Date   ??? 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 Hx endoscopy      colonoscopy last was in 2008      Family History   Problem Relation Age of Onset   ??? Arthritis-osteo Mother    ??? Hypertension Mother    ??? Arthritis-osteo Father    ??? Arthritis-osteo Brother    ??? Cancer Paternal Uncle      unknown   ??? Cancer Paternal Uncle      stomach   ??? Diabetes Paternal Uncle      History     Social History   ??? Marital Status: SINGLE     Spouse Name: N/A     Number of Children: N/A   ??? Years of Education: N/A      Occupational History   ??? Not on file.     Social History Main Topics   ??? Smoking status: Former Smoker     Quit date: 11/30/1984   ??? Smokeless tobacco: Not on file   ??? Alcohol Use: Yes      3 glasses of wine "couple of drinks"    ??? Drug Use: No   ??? Sexually Active: Yes -- Male partner(s)     Other Topics Concern   ??? Blood Transfusions No     Social History Narrative   ??? No narrative on file     No Known Allergies  Current Outpatient Prescriptions on File Prior to Visit   Medication Sig Dispense Refill   ??? triamcinolone acetonide (KENALOG) 0.5 % ointment Apply  to affected area two (2) times a day. use thin layer  30 g  0   ??? vardenafil (LEVITRA)  10 mg tablet Take 10 mg by mouth as needed. One hour prior to desired affect  30 Tab  1   ??? warfarin (COUMADIN) 5 mg tablet Take 5 mg by mouth daily.       ??? lisinopril-hydrochlorothiazide (PRINZIDE, ZESTORETIC) 20-12.5 mg per tablet Take  by mouth daily.       ??? multivitamin (ONE A DAY) tablet Take 1 Tab by mouth daily.           Filed Vitals:    01/09/12 1148   BP: 140/90   Pulse: 100   Resp: 16   Height: 5\' 10"  (1.778 m)   SpO2: 98%   .  There is no weight on file to calculate BMI.          Physical Exam    General: NAD. A x O x 3     Skin:  Small laceration on bottom of lip    HEENT: Head: atraumatic normocephalic, Eyes:  PERLA, EOMI Ears: without discharge or pain of external exam Nares: patent Throat: with out erythema     Neck: no thyromegaly    Heart:  RRR, S1S2, no murmur, gallop, or rub    Lungs:   CTA bilaterally     Abdomen:  Soft, nontender, BS x 4    Extremities:  Strength 4/4, no cyanosis, no edema      Procedure:   Discussed treatment options with patient with benefits, risk, side effects, and possible complications.  Questions answered.   Using Silver Nitrate Stick Area lower lip abrasion was cauterized successfully.  Pt was made to wait 15 min to make sure that the laceration stopped bleeding.   Discussed home care.    ASSESSMENT and PLAN  1.  Anticoagulated on Coumadin  AMB POC PT/INR  Hold Coumadin tonight   GO to ED if there are any signs of bleeding, including re opening of the laceration that was cauterized   Recheck in am      2. Bleeding  AMB POC PT/INR, silver nitrate applicators Misc   3. Laceration  silver nitrate applicators Misc     Follow-up Disposition: ONE DAY     lab results and schedule of future lab studies reviewed with patient  reviewed medications and side effects in detail    I have discussed the above plan with the patient. Patient has also been given instructions and information on his conditions.  Patient is aware of all possible side effects and agrees with the above mentioned plan.       Donnella Bi, DO

## 2012-01-11 NOTE — Patient Instructions (Signed)
Wound Care: After Your Visit to the Emergency Room  Your Care Instructions  The care you need depends on the type of wound you have. Taking good care of your wound at home will help it heal quickly and will reduce your chance of infection.  Even though you have been released from the emergency room, you still need to watch for any problems. The doctor carefully checked you. But sometimes problems can develop later. If you have new symptoms, or if your symptoms do not get better, return to the emergency room or call your doctor right away.  A visit to the emergency room is only one step in your treatment. Even if you feel better, you still need to do what your doctor recommends, such as going to all suggested follow-up appointments and taking medicines exactly as directed. This will help you recover and help prevent future problems.  How can you care for yourself at home?  ?? Clean the area with soap and water 2 times a day, or as your doctor tells you. Don't use hydrogen peroxide or alcohol, which can slow healing.   ?? Unless your doctor gives you other directions, cover the wound with a thin layer of antibiotic ointment, such as bacitracin, and a bandage. Do not use an ointment that contains neomycin, because it can irritate the skin.   ?? Apply more ointment and replace the bandage as your doctor tells you.   ?? If the bandage is stuck to a scab, soak it in warm water to soften the scab. This will make the bandage easier to remove.   ?? Ask your doctor if you can take an over-the-counter pain medicine. Do not take two or more pain medicines at the same time unless the doctor told you to.   ?? Some pain is normal with a wound, but do not ignore pain that is getting worse instead of better. You could have an infection.   ?? Your doctor may have closed your wound with stitches (sutures), staples, or skin glue.   ?? If you have stitches, your doctor may remove them after several days to 2 weeks. Or you may have stitches that  dissolve on their own.   ?? If you have staples, your doctor may remove them after 7 to 10 days.   ?? If your wound was closed with skin glue, the glue will wear off in a few days to 2 weeks.   When should you call for help?  Return to the emergency room now if:  ?? You have signs of infection, such as:   ?? Increased pain, swelling, warmth, or redness around the wound.   ?? Red streaks leading from the wound.   ?? Pus draining from the wound.   ?? Swollen lymph nodes in your neck, armpits, or groin.   ?? A fever.   ?? The wound starts to bleed, and blood soaks through the bandage. (Oozing small amounts of blood is normal.)   Call your doctor today if:  ?? The wound is not getting better each day.     Where can you learn more?    Go to http://[email or web address].net/[email or web address]   Enter P907 in the search box to learn more about "Wound Care: After Your Visit to the Emergency Room."    ?? 2006-2012 [employer]. Care instructions adapted under license by [place] (which disclaims liability or warranty for this information). This care instruction is for use with your licensed healthcare professional. If you have   questions about a medical condition or this instruction, always ask your healthcare professional. [employer], Inc[employer] disclaims any warranty or liability for your use of this information.  Content Version: 9.3.73196; Last Revised: January 15, 2009

## 2012-01-11 NOTE — Progress Notes (Signed)
HISTORY OF PRESENT ILLNESS  Julian Bailey is a 66 y.o. male.    has a past medical history of Hypertension; Arthritis; BPH (benign prostatic hyperplasia); DVT (deep venous thrombosis) (x 2 2009  and 2011 ); and ED (erectile dysfunction).    Chief Complaint   Patient presents with   ??? Anticoagulation         HPI    Pt presents for follow-up of Supra therapeutic INR and laceration which required chemical cauterization to the office after cutting himself shaving.   Pt denies any since that time. He states that he is feeling much better. Pulse is 80 today.    He denies any SOB, dizziness, or chest pain. His PT/INR was re -checked and sub therapeutic at 1.8     Patient here for followup of chronic anticoagulation.   Indication: DVT   Bleeding Signs/Symptoms: Yes - see above   Thromboembolic Signs/Symptoms: None   Missed Coumadin Doses: None   Medication Changes: no   Dietary Changes: yes - "has not eaten any green leafy salads for a while- I have been trying to be better with my diet."   Bacterial/Viral Infection: no   Medication Dosage: 5 mg daily x several years - has held his coumadin for 3 days         ROS A thorough 10 point review of systems was done and was unremarkable except for HPI and chronic medical conditions.       Past Medical History   Diagnosis Date   ??? Hypertension    ??? Arthritis    ??? BPH (benign prostatic hyperplasia)    ??? DVT (deep venous thrombosis) x 2 2009  and 2011      after knee surgery then after long car ride    ??? ED (erectile dysfunction)      Past Surgical History   Procedure Date   ??? 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 Hx endoscopy      colonoscopy last was in 2008      Family History   Problem Relation Age of Onset   ??? Arthritis-osteo Mother    ??? Hypertension Mother    ??? Arthritis-osteo Father    ??? Arthritis-osteo Brother    ??? Cancer Paternal Uncle      unknown   ??? Cancer Paternal Uncle      stomach   ??? Diabetes Paternal Uncle      History     Social History   ??? Marital Status:  SINGLE     Spouse Name: N/A     Number of Children: N/A   ??? Years of Education: N/A     Occupational History   ??? Not on file.     Social History Main Topics   ??? Smoking status: Former Smoker     Quit date: 11/30/1984   ??? Smokeless tobacco: Not on file   ??? Alcohol Use: Yes      3 glasses of wine "couple of drinks"    ??? Drug Use: No   ??? Sexually Active: Yes -- Male partner(s)     Other Topics Concern   ??? Blood Transfusions No     Social History Narrative   ??? No narrative on file     No Known Allergies  Current Outpatient Prescriptions on File Prior to Visit   Medication Sig Dispense Refill   ??? silver nitrate applicators Misc Apply 1 Each to affected area once for 1  dose.  1 Applicator  0   ??? triamcinolone acetonide (KENALOG) 0.5 % ointment Apply  to affected area two (2) times a day. use thin layer  30 g  0   ??? vardenafil (LEVITRA) 10 mg tablet Take 10 mg by mouth as needed. One hour prior to desired affect  30 Tab  1   ??? lisinopril-hydrochlorothiazide (PRINZIDE, ZESTORETIC) 20-12.5 mg per tablet Take  by mouth daily.       ??? multivitamin (ONE A DAY) tablet Take 1 Tab by mouth daily.           Filed Vitals:    01/11/12 2041   BP: 120/80   .  There is no height or weight on file to calculate BMI.        EKG: there are no previous tracings available for comparison, sinus tachycardia.    Physical Exam   Nursing note and vitals reviewed.  Constitutional: He is oriented to person, place, and time. He appears well-developed and well-nourished. No distress.   HENT:   Head: Normocephalic and atraumatic.   Right Ear: External ear normal.   Left Ear: External ear normal.   Nose: Nose normal.   Mouth/Throat: Oropharynx is clear and moist. No oropharyngeal exudate.   Eyes: Conjunctivae are normal. Pupils are equal, round, and reactive to light. No scleral icterus.   Neck: Normal range of motion. Neck supple. No JVD present. No tracheal deviation present.   Cardiovascular: Normal rate, regular rhythm and intact distal pulses.   Exam reveals friction rub. Exam reveals no gallop.    Murmur heard. --- RESOLVED   Pulmonary/Chest: Effort normal and breath sounds normal. No respiratory distress.   Abdominal: Soft. Bowel sounds are normal. He exhibits no distension and no mass. There is no tenderness.   Musculoskeletal: Normal range of motion. He exhibits no edema.   Lymphadenopathy:     He has no cervical adenopathy.   Neurological: He is alert and oriented to person, place, and time.   Skin: Skin is warm and dry.   Psychiatric: He has a normal mood and affect. His behavior is normal.       Lab Results   Component Value Date/Time    INR POC 1.8 01/11/2012  8:51 AM    INR POC 4.8 01/10/2012 10:24 AM    INR POC 5.4 12/20/2011  2:09 PM    INR 1.4 10/18/2011  3:55 PM    Prothrombin time 16.8 10/18/2011  3:55 PM     Reviewed ECHO in detail       ASSESSMENT and PLAN  1. Subtherapeutic international normalized ratio (INR)  AMB POC PT/INR, warfarin (COUMADIN) 2.5 mg tablet, warfarin (COUMADIN) 5 mg tablet   2. Anticoagulated on Coumadin  AMB POC PT/INR, warfarin (COUMADIN) 2.5 mg tablet, warfarin (COUMADIN) 5 mg tablet       WILL REMIND PT AGAIN TO STOP AND HAVE LABS DONE IF HE IS NOT FASTING THEN WE WILL HOLD OFF ONLY ON CHOLESTEROL   Follow-up Disposition: one day--- Discussed that we will attempt to get pt set up with home PT/INR machine-  Then twice weekly he can call with results or more preferably activate his mychart and we can track then that way     lab results and schedule of future lab studies reviewed with patient  reviewed diet, exercise and weight control  reviewed medications and side effects in detail      I have discussed the above plan with the patient. Patient has  also been given instructions and information on his conditions.  Patient is aware of all possible side effects and agrees with the above mentioned plan.         Donnella Bi, DO

## 2012-01-11 NOTE — Progress Notes (Addendum)
HISTORY OF PRESENT ILLNESS  Julian Bailey is a 66 y.o. male.    has a past medical history of Hypertension; Arthritis; BPH (benign prostatic hyperplasia); DVT (deep venous thrombosis) (x 2 2009  and 2011 ); and ED (erectile dysfunction).    Chief Complaint   Patient presents with   ??? Follow-up     very shaky says he did not sleep well          HPI    Pt presents for follow-up of Supra therapeutic INR and laceration which required chemical cauterization to the office after cutting himself shaving.   Pt denies any since that time. He does however states that he was unable to sleep last night and is visibly tremulous with tachycardia.  He denies any SOB, dizziness, or chest pain. His PT/INR was re -checked and supra therapeutic at 4.8     Patient here for followup of chronic anticoagulation.   Indication: DVT   Bleeding Signs/Symptoms: Yes - see above   Thromboembolic Signs/Symptoms: None   Missed Coumadin Doses: None   Medication Changes: no   Dietary Changes: yes - "has not eaten any green leafy salads for a while- I have been trying to be better with my diet."   Bacterial/Viral Infection: no   Medication Dosage: 5 mg daily x several years         ROS  A thorough 10 point review of systems was done and was unremarkable except for HPI and chronic medical conditions.       Past Medical History   Diagnosis Date   ??? Hypertension    ??? Arthritis    ??? BPH (benign prostatic hyperplasia)    ??? DVT (deep venous thrombosis) x 2 2009  and 2011      after knee surgery then after long car ride    ??? ED (erectile dysfunction)      Past Surgical History   Procedure Date   ??? 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 Hx endoscopy      colonoscopy last was in 2008      Family History   Problem Relation Age of Onset   ??? Arthritis-osteo Mother    ??? Hypertension Mother    ??? Arthritis-osteo Father    ??? Arthritis-osteo Brother    ??? Cancer Paternal Uncle      unknown   ??? Cancer Paternal Uncle      stomach   ??? Diabetes Paternal Uncle       History     Social History   ??? Marital Status: SINGLE     Spouse Name: N/A     Number of Children: N/A   ??? Years of Education: N/A     Occupational History   ??? Not on file.     Social History Main Topics   ??? Smoking status: Former Smoker     Quit date: 11/30/1984   ??? Smokeless tobacco: Not on file   ??? Alcohol Use: Yes      3 glasses of wine "couple of drinks"    ??? Drug Use: No   ??? Sexually Active: Yes -- Male partner(s)     Other Topics Concern   ??? Blood Transfusions No     Social History Narrative   ??? No narrative on file     No Known Allergies  Current Outpatient Prescriptions on File Prior to Visit   Medication Sig Dispense Refill   ??? silver nitrate applicators  Misc Apply 1 Each to affected area once for 1 dose.  1 Applicator  0   ??? triamcinolone acetonide (KENALOG) 0.5 % ointment Apply  to affected area two (2) times a day. use thin layer  30 g  0   ??? vardenafil (LEVITRA) 10 mg tablet Take 10 mg by mouth as needed. One hour prior to desired affect  30 Tab  1   ??? warfarin (COUMADIN) 5 mg tablet Take 5 mg by mouth daily.       ??? lisinopril-hydrochlorothiazide (PRINZIDE, ZESTORETIC) 20-12.5 mg per tablet Take  by mouth daily.       ??? multivitamin (ONE A DAY) tablet Take 1 Tab by mouth daily.           Filed Vitals:    01/10/12 1022   BP: 144/90   Pulse: 114   Temp: 98 ??F (36.7 ??C)   Resp: 16   Height: 5\' 10"  (1.778 m)   SpO2: 98%   .  There is no weight on file to calculate BMI.        EKG: there are no previous tracings available for comparison, sinus tachycardia.    Physical Exam   Nursing note and vitals reviewed.  Constitutional: He is oriented to person, place, and time. He appears well-developed and well-nourished. No distress.   HENT:   Head: Normocephalic and atraumatic.   Right Ear: External ear normal.   Left Ear: External ear normal.   Nose: Nose normal.   Mouth/Throat: Oropharynx is clear and moist. No oropharyngeal exudate.   Eyes: Conjunctivae are normal. Pupils are equal, round, and reactive to  light. No scleral icterus.   Neck: Normal range of motion. Neck supple. No JVD present. No tracheal deviation present.   Cardiovascular: Regular rhythm and intact distal pulses.  Tachycardia present.  Exam reveals friction rub. Exam reveals no gallop.    Murmur heard.  Pulmonary/Chest: Effort normal and breath sounds normal. No respiratory distress.   Abdominal: Soft. Bowel sounds are normal. He exhibits no distension and no mass. There is no tenderness.   Musculoskeletal: Normal range of motion. He exhibits no edema.   Lymphadenopathy:     He has no cervical adenopathy.   Neurological: He is alert and oriented to person, place, and time.   Skin: Skin is warm and dry.   Psychiatric: He has a normal mood and affect. His behavior is normal.     Murmur and Friction rub are new onset   INR 4.8   ASSESSMENT and PLAN  1. Anticoagulated on Coumadin  AMB POC PT/INR, 2D ECHO COMPLETE ADULT (TTE), STAT- call and hold  Follow-up in am if not admitted    2. Supratherapeutic INR  2D ECHO COMPLETE ADULT (TTE), XR CHEST AP LAT   3. Abnormal heart sounds  2D ECHO COMPLETE ADULT (TTE), XR CHEST AP LAT     WILL REMIND PT AGAIN TO STOP AND HAVE LABS DONE IF HE IS NOT FASTING THEN WE WILL HOLD OFF ONLY ON CHOLESTEROL   Follow-up Disposition: One day if not admitted   lab results and schedule of future lab studies reviewed with patient  reviewed diet, exercise and weight control  reviewed medications and side effects in detail    Donnella Bi, DO

## 2012-01-12 MED ORDER — WARFARIN 5 MG TAB
5 mg | ORAL_TABLET | ORAL | Status: DC
Start: 2012-01-12 — End: 2012-01-13

## 2012-01-12 MED ORDER — WARFARIN 2.5 MG TAB
2.5 mg | ORAL_TABLET | ORAL | Status: DC
Start: 2012-01-12 — End: 2012-01-13

## 2012-01-13 LAB — AMB POC PT/INR: INR POC: 1.5

## 2012-01-13 NOTE — Progress Notes (Signed)
Coumadin Flowsheet Values Recorded Today: Date: 01/13/12  DX: DVT  Dosage: 5 mg  Sunday: 5 mg  Monday: 5 mg  Tuesday: 2.5 mg  Wednesday: 2.5 mg  Thursday: 5 mg  Friday: 5 mg  Saturday: 5 mg  INR results: 1.5  INR Results Ranges: 2-3  Denies missing dose or extra dose?: No  Bleeding or unusual bruising?: No  Blood in stool or urine?: No  Coughing up blood?: No  Alcohol Use?: No  Any change in medications?: No  Using ASA or any NSAID?: No  Epistaxis?: No  Same dose?: No  Change dose to: 5 mg five days per week, and 2.5 mg two days per week  Next Check: 01/20/12  Comments: as above

## 2012-01-13 NOTE — Patient Instructions (Signed)
Coumadin Flowsheet Values Recorded Today: Date: 01/13/12  DX: DVT  Dosage: 5 mg  Sunday: 5 mg  Monday: 5 mg  Tuesday: 2.5 mg  Wednesday: 2.5 mg  Thursday: 5 mg  Friday: 5 mg  Saturday: 5 mg  INR results: 1.5  INR Results Ranges: 2-3  Denies missing dose or extra dose?: No  Bleeding or unusual bruising?: No  Blood in stool or urine?: No  Coughing up blood?: No  Alcohol Use?: No  Any change in medications?: No  Using ASA or any NSAID?: No  Epistaxis?: No  Same dose?: No  Change dose to: 5 mg five days per week, and 2.5 mg two days per week  Next Check: 01/20/12  Comments: as above

## 2012-01-13 NOTE — Progress Notes (Deleted)
HISTORY OF PRESENT ILLNESS  Julian Bailey is a 66 y.o. male.  HPI    ROS    Physical Exam    ASSESSMENT and PLAN  {ASSESSMENT/PLAN:19072}

## 2012-02-06 NOTE — Progress Notes (Signed)
Patient checked his INR today and it was 1.0 we told him to take 7.5mg  today and tomorrow and then go on 6mg  daily and re check Thursday and call us with results. Patient was given Dr. Garen Lah personal cell phone number to ensure that he got to speak to someone to give his results to

## 2012-02-13 ENCOUNTER — Encounter

## 2012-02-13 LAB — HEPATIC FUNCTION PANEL
A-G Ratio: 1.2 (ref 1.2–2.2)
ALT (SGPT): 97 U/L — ABNORMAL HIGH (ref 12–78)
AST (SGOT): 46 U/L — ABNORMAL HIGH (ref 15–37)
Albumin: 3.8 g/dL (ref 3.4–5.0)
Alk. phosphatase: 69 U/L (ref 50–136)
Bilirubin, direct: 0.3 MG/DL — ABNORMAL HIGH (ref 0.0–0.2)
Bilirubin, total: 1.4 MG/DL — ABNORMAL HIGH (ref <1.1)
Globulin: 3.2 g/dL (ref 2.4–3.5)
Protein, total: 7 g/dL (ref 6.4–8.2)

## 2012-02-14 NOTE — H&P (Signed)
History and Physical        Patient: Julian Bailey               Sex: male          DOA: @INPADMDT @       Date of Birth:  Dec 10, 1945      Age:  66 y.o.        LOS:         HPI:     Julian Bailey is a 67 y.o. 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    Present Illness: Pt states " I just recently moved here and I have noticed that the vision is blurred, its been going on for the last year but its getting worse the last few months."    -No pain  -No flashes  -No itching/burning/tearing     +Glare OD  @ night when driving  x 1 year      +Floaters  OU  OS>OD  x a few years  Hx PVD    +Blurred [facility] OU  x 1 year but worsening the past 2-3 months  blurred at intermediate distance when working on the computer  OD reads well @ near  OS for distance    Gtts use: Refresh QD OU   Meds updated per pt              HPI performed by:CHG                                                Oriented to:  Time (X) Place (X) &Person (X) Mood: Normal (X) Depressed ( ) Anxious ( ) Agitated ( )           Primary Care Physician   Dr. Mayford Knife        Medical Doctors:           Primary Care Physician   Dr. Mayford Knife       Medications: Aspirin (Dosage:  )  Coumadin (Dosage:  )  Kenalog Ointment (Dosage:  )  Lisinopril (Dosage:  )  Multivitamin (Dosage:  )       Past Medical History: GENERAL:  Arthritis  ED  Hypertension    SURGICAL:   Colonoscopy  [facility] EKG  Endoscopy  Knee Replacement     Allergies: nkda               Social History Alcohol/Drug Use: occasional/social  Living Status: lives alone  Smoking: former smoker         Family History: Cataracts:  Father,brother  retinal detachments:  Father     Past Eye History: Cataract OD:  Dr. Rush [place]  Lasik 2003     Review of Systems:  Yes No    Chronic fever, unexpected weight loss/gain, fatigue ( ) (X)     Ear/Nose/Throat problems ( ) (X)    Metabolic/Diabetes Thyroid ( )   (X)    Heart Problems ( ) (X)    Respiratory Problems ( ) (X)     Gastrointestinal Problems ( ) (X)    Urinary Problems ( ) (X)    Skin Problems ( ) (X)    Musculoskeletal Problems ( ) (X)    Neurological Problems ( ) (X)    Psychiatric Problems ( ) (X)  Physical Exam:      There were no vitals taken for this visit.    Physical Exam:    General:  Alert, cooperative, well noursished, well developed, appears stated age   Eyes:  Sclera anicteric. PERRL   Mouth/Throat: Mucous membranes normal, oral pharynx clear   Neck: Supple   Lungs:   Clear to auscultation bilaterally, good effort   CV:  Regular rate and rhythm,no murmur, click, rub or gallop   Abdomen:   Soft, non-tender. bowel sounds normal. non-distended   Extremities: No cyanosis or edema   Skin: Skin color, texture, turgor normal. no acute rash or lesions   Lymph nodes: Cervical and supraclavicular normal   Musculoskeletal:  No swelling or deformity   Lines/Devices:  Intact, no erythema, drainage or tenderness   Psych: Alert and oriented, normal mood affect given the setting       Eye:  Cataract, right eye    Assessment/Plan     Cataract right eye, plan phaco with pciol, right eye

## 2012-02-15 LAB — POTASSIUM: Potassium: 4 MMOL/L (ref 3.5–5.3)

## 2012-02-15 NOTE — Other (Signed)
Name, date of birth, procedure verified with patient in PAT.

## 2012-02-16 LAB — EKG, 12 LEAD, INITIAL
Atrial Rate: 93 {beats}/min
Calculated P Axis: 58 degrees
Calculated R Axis: -13 degrees
Calculated T Axis: 32 degrees
P-R Interval: 138 ms
Q-T Interval: 338 ms
QRS Duration: 98 ms
QTC Calculation (Bezet): 420 ms
Ventricular Rate: 93 {beats}/min

## 2012-02-20 ENCOUNTER — Inpatient Hospital Stay: Payer: MEDICARE

## 2012-02-20 MED ADMIN — prednisoLONE acetate (PRED FORTE) 1 % ophthalmic suspension 1 Drop: OPHTHALMIC | @ 15:00:00 | NDC 11980018005

## 2012-02-20 MED ADMIN — tropicamide (mydRIACYL) 1 % ophthalmic solution 2 Drop: OPHTHALMIC | @ 14:00:00 | NDC 24208058559

## 2012-02-20 MED ADMIN — tetracaine (TETRAVISC) 0.5 % viscous ophthalmic solution 1 Drop: OPHTHALMIC | @ 14:00:00 | NDC 54799050505

## 2012-02-20 MED ADMIN — phenylephrine (mydFRIN) 2.5 % ophthalmic solution 2 Drop: OPHTHALMIC | @ 14:00:00 | NDC 61314034202

## 2012-02-20 MED ADMIN — moxifloxacin (VIGAMOX) 0.5 % ophthalmic solution 1 Drop: OPHTHALMIC | @ 15:00:00 | NDC 00065401303

## 2012-02-20 MED ADMIN — tetracaine (TETRAVISC) 0.5 % viscous ophthalmic solution 1 Drop: OPHTHALMIC | @ 15:00:00 | NDC 54799050505

## 2012-02-20 MED ADMIN — lactated ringers infusion: INTRAVENOUS | @ 14:00:00 | NDC 00409795309

## 2012-02-20 MED ADMIN — balanced salt (BSS) ophthalmic solution 300 Drop: OPHTHALMIC | @ 15:00:00 | NDC 00065079515

## 2012-02-20 MED ADMIN — moxifloxacin (VIGAMOX) 0.5 % ophthalmic solution 2 Drop: OPHTHALMIC | @ 14:00:00 | NDC 00065401303

## 2012-02-20 MED ADMIN — ondansetron (ZOFRAN) injection 4 mg: INTRAVENOUS | @ 14:00:00 | NDC 64679072601

## 2012-02-20 MED ADMIN — EPINEPHrine 0.5 mg in balanced salt (BSS PLUS) 500 mL Irrigation: @ 15:00:00 | NDC 00517113005

## 2012-02-20 MED ADMIN — sodium chloride irrigation 0.9 % irrigation 250 mL: @ 15:00:00 | NDC 00409613822

## 2012-02-20 MED ADMIN — chondroitin sulf-sod hyaluronate (DUOVISC) kit 1 Kit: OPHTHALMIC | @ 15:00:00 | NDC 08065183975

## 2012-02-20 MED ADMIN — sterile water irrigation irrigation 250 mL: @ 15:00:00 | NDC 00409613903

## 2012-02-20 MED FILL — SODIUM CHLORIDE 0.9 % IRRIGATION SOLN: 0.9 % | Qty: 250

## 2012-02-20 MED FILL — MIDAZOLAM 1 MG/ML IJ SOLN: 1 mg/mL | INTRAMUSCULAR | Qty: 1

## 2012-02-20 MED FILL — ONDANSETRON (PF) 4 MG/2 ML INJECTION: 4 mg/2 mL | INTRAMUSCULAR | Qty: 2

## 2012-02-20 MED FILL — BSS PLUS INTRAOCULAR SOLUTION: INTRAOCULAR | Qty: 500

## 2012-02-20 MED FILL — FENTANYL CITRATE (PF) 50 MCG/ML IJ SOLN: 50 mcg/mL | INTRAMUSCULAR | Qty: 2

## 2012-02-20 MED FILL — VIGAMOX 0.5 % EYE DROPS: 0.5 % | OPHTHALMIC | Qty: 3

## 2012-02-20 MED FILL — NORMAL SALINE FLUSH 0.9 % INJECTION SYRINGE: INTRAMUSCULAR | Qty: 10

## 2012-02-20 MED FILL — WATER FOR IRRIGATION, STERILE SOLUTION: Qty: 250

## 2012-02-20 MED FILL — PRED FORTE 1 % EYE DROPS,SUSPENSION: 1 % | OPHTHALMIC | Qty: 10

## 2012-02-20 MED FILL — PHENYLEPHRINE 2.5 % EYE DROPS: 2.5 % | OPHTHALMIC | Qty: 2

## 2012-02-20 MED FILL — EPINEPHRINE 1 MG/ML IJ SOLN: 1 mg/mL | INTRAMUSCULAR | Qty: 0.5

## 2012-02-20 MED FILL — TROPICAMIDE 1 % EYE DROPS: 1 % | OPHTHALMIC | Qty: 2

## 2012-02-20 MED FILL — DUOVISC VISCO ELASTIC 3 %-4 %(0.5 ML) 1 %(0.55 ML) INTRAOCULAR SYRINGE: 3 %-4 %(0.5 mL) 1 % (0.55 mL) | INTRAOCULAR | Qty: 1

## 2012-02-20 MED FILL — TETRAVISC 0.5 % VISCOUS EYE DROPS: 0.5 % | OPHTHALMIC | Qty: 5

## 2012-02-20 MED FILL — BSS INTRAOCULAR SOLUTION: INTRAOCULAR | Qty: 15

## 2012-02-20 NOTE — Op Note (Signed)
Cataract Operative Report     Indications: The patient has a cataract of the right eye.  The patient's vision has decreased to 20/30-.  With glare testing the patient's vision has decreased to 20/100. This has affected their life style.  Trouble with computer work.  Risks, benefits and alternatives of Cataract Surgery were discussed with the patient and consent was obtained.    Date of Surgery: 02/20/2012     Preoperative Diagnosis: CATARACT RIGHT EYE     Postoperative Diagnosis: CATARACT RIGHT EYE     Procedure: Procedure(s) with comments:  CATARACT EXTRACTION WITH INTRA OCULAR LENS IMPLANT - CATARACT EXTRACTION BY PHACO WITH INTRA OCULAR LENS IMPLANT RIGHT EYE      Surgeon(s) and Role:     * Julian Catholicarter Javante Nilsson, MD - Primary     Anesthesia: Topical anesthesia with 1% Tetracaine Drops and 2% Lidocaine Jelly and light IV sedation.    Procedure Details:  The patient was brought into the operating room and placed in the supine position.  Under light IV anesthesia, the patient was prepped and draped in sterile ophthalmic fashion for the  operative eye.  Under the microscope, a temporal approach was taken. A paracentesis was made. The anterior chamber was filled with Viscoat. The wound was constructed temporally with the 2.4 mm keratome. A continuous curvilinear capsulorhexis was fashioned first with a cystotome and then with Utratta forceps. Hydrodissection and hydrodelineation were performed.  The lens was removed with a divide and conquer technique.  Irrigation and aspiration were used to remove the remaining cortex.  The capsular bag was filled with ProVisc and found to be intact.  An 19.5 SN60WF lens was placed into the capsular bag.  Irrigation and aspiration was used to remove viscoelastic. The anterior chamber was filled with BSS and the wounds hydrated.  The wounds were found to be watertight.  The lid speculum was removed and a drop of Vigamox and 1%Prednisilone were placed in the eye. A clear plastic shield was  placed over the eye and the patient was taken from the operating room to recovery in good condition.                       Complications:  None           Signed By: Julian Catholicarter Ladena Jacquez, MD

## 2012-02-20 NOTE — Interval H&P Note (Signed)
H&P Update:  Julian Bailey was seen and examined.  History and physical has been reviewed. There have been no significant clinical changes since the completion of the originally dated History and Physical.    Signed By: Malena Catholic, MD     February 20, 2012 10:23 AM

## 2012-02-20 NOTE — Op Note (Signed)
Cataract Operative Report     Indications: The patient has a cataract of the right eye.  The patient's vision has decreased to 20/30-.  With glare testing the patient's vision has decreased to 20/100. This has affected their life style.  Trouble with computer work.  Risks, benefits and alternatives of Cataract Surgery were discussed with the patient and consent was obtained.    Date of Surgery: 02/20/2012     Preoperative Diagnosis: CATARACT RIGHT EYE     Postoperative Diagnosis: CATARACT RIGHT EYE     Procedure: Procedure(s) with comments:  CATARACT EXTRACTION WITH INTRA OCULAR LENS IMPLANT - CATARACT EXTRACTION BY PHACO WITH INTRA OCULAR LENS IMPLANT RIGHT EYE      Surgeon(s) and Role:     * Armida Vickroy, MD - Primary     Anesthesia: Topical anesthesia with 1% Tetracaine Drops and 2% Lidocaine Jelly and light IV sedation.    Procedure Details:  The patient was brought into the operating room and placed in the supine position.  Under light IV anesthesia, the patient was prepped and draped in sterile ophthalmic fashion for the  operative eye.  Under the microscope, a temporal approach was taken. A paracentesis was made. The anterior chamber was filled with Viscoat. The wound was constructed temporally with the 2.4 mm keratome. A continuous curvilinear capsulorhexis was fashioned first with a cystotome and then with Utratta forceps. Hydrodissection and hydrodelineation were performed.  The lens was removed with a divide and conquer technique.  Irrigation and aspiration were used to remove the remaining cortex.  The capsular bag was filled with ProVisc and found to be intact.  An 19.5 SN60WF lens was placed into the capsular bag.  Irrigation and aspiration was used to remove viscoelastic. The anterior chamber was filled with BSS and the wounds hydrated.  The wounds were found to be watertight.  The lid speculum was removed and a drop of Vigamox and 1%Prednisilone were placed in the eye. A clear plastic shield was  placed over the eye and the patient was taken from the operating room to recovery in good condition.                       Complications:  None           Signed By: Derrall Hicks, MD

## 2012-02-20 NOTE — Progress Notes (Signed)
Post - Anesthesia Evaluation & Assessment Note:    Name: Julian Bailey  Age: 66 y.o.  Sex: male  DOB: Aug 11, 1945  MRN: [id number]  CSN: [id number]     Patient is s/p (type of anesthesia): MAC    Visit Vitals   Item Reading   ??? BP 98/69   ??? Pulse 80   ??? Temp 97.4 ??F (36.3 ??C)   ??? Resp 24   ??? Ht 5\' 10"  (1.778 m)   ??? Wt 92.534 kg (204 lb)   ??? BMI 29.27 kg/m2   ??? SpO2 98%     Nausea/Vomiting: no nausea and no vomiting       Post-operative hydration adequate.      Pain score: 0      Mental status & level of consciousness: somewhat drowsy, but oriented x 3.    Neurological status: moves all extremities, sensation grossly intact.      Pulmonary status: airway patent, receiving supplemental oxygen.      No cyanosis noted.  Swallowing and gag reflex intact/normal.      Complications related to anesthesia: None.     Additional comments: None.      Patient has met all discharge requirements.       Dian Situ, MD    02/20/2012 11:10 AM

## 2012-02-20 NOTE — Other (Signed)
CDE  11.39

## 2012-02-21 MED FILL — EPINEPHRINE (PF) 1 MG/ML INJECTION: 1 mg/mL ( mL) | INTRAMUSCULAR | Qty: 1

## 2012-02-21 NOTE — Progress Notes (Signed)
HISTORY OF PRESENT ILLNESS  Julian Bailey is a 66 y.o. male.    has a past medical history of Hypertension; Arthritis; BPH (benign prostatic hyperplasia); DVT (deep venous thrombosis) (x 2 2009  and 2011 ); ED (erectile dysfunction); and Thromboembolus.    Chief Complaint   Patient presents with   ??? Follow-up     discuss lab work concerning LFTs         HPI  Pt recently diagnosed with chronic tinea and has Lft's ordered by dermatologist because he was going to start him lamisil.  They were elevated and was sent back to family doctor for a note that is "okay to be on Lamisil.  Pt denies any use of tylenol.  He states that he has always had a few glasses of wine or drinks during the week but that for 3 weeks he has completely stopped drinking.  He denies any hx of high cholesterol and states that he was told years ago that he had "benign elevated bilirubin"  He states that Gilbert's disease sounds familiar.  He however has never had any other liver studies abnormal.  Denies any nausea, vomiting, diarrhea. No hx of blood transfusion.  No history of IV drug use.  Pt haa never had a complete set of labs since establishing here.         Review of Systems   Constitutional: Positive for malaise/fatigue. Negative for fever, chills, weight loss and diaphoresis.   HENT: Negative for congestion and sore throat.    Eyes: Negative for blurred vision and photophobia.   Respiratory: Negative for cough and shortness of breath.    Cardiovascular: Negative for chest pain, palpitations and leg swelling.   Gastrointestinal: Negative for heartburn, nausea, vomiting, abdominal pain, diarrhea, constipation, blood in stool and melena.   Genitourinary: Negative for dysuria.   Musculoskeletal: Negative for myalgias.   Skin: Positive for itching and rash.   Neurological: Negative for dizziness, loss of consciousness, weakness and headaches.   Psychiatric/Behavioral: Negative.      Past Medical History   Diagnosis Date   ??? Hypertension    ???  Arthritis    ??? BPH (benign prostatic hyperplasia)    ??? DVT (deep venous thrombosis) x 2 2009  and 2011      after knee surgery then after long car ride    ??? ED (erectile dysfunction)    ??? Thromboembolus      Past Surgical History   Procedure Date   ??? 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 Hx endoscopy      colonoscopy last was in 2008    ??? Hx orthopaedic      Family History   Problem Relation Age of Onset   ??? Arthritis-osteo Mother    ??? Hypertension Mother    ??? Arthritis-osteo Father    ??? Arthritis-osteo Brother    ??? Cancer Paternal Uncle      unknown   ??? Cancer Paternal Uncle      stomach   ??? Diabetes Paternal Uncle      History     Social History   ??? Marital Status: SINGLE     Spouse Name: N/A     Number of Children: N/A   ??? Years of Education: N/A     Occupational History   ??? Not on file.     Social History Main Topics   ??? Smoking status: Former Smoker -- 0.5 packs/day for 20 years  Quit date: 11/30/1984   ??? Smokeless tobacco: Not on file   ??? Alcohol Use: Yes      3 glasses of wine "couple of drinks"    ??? Drug Use: No   ??? Sexually Active: Yes -- Male partner(s)     Other Topics Concern   ??? Blood Transfusions No     Social History Narrative   ??? No narrative on file     No Known Allergies  Current Outpatient Prescriptions on File Prior to Visit   Medication Sig Dispense Refill   ??? aspirin 81 mg tablet Take 81 mg by mouth daily.       ??? fexofenadine (ALLEGRA) 180 mg tablet Take  by mouth daily.       ??? warfarin (COUMADIN) 5 mg tablet Take 7.5 mg by mouth daily. 5 mg Thursday thru Monday, 2.5 mg on Tuesday and Wednesday.  30 Tab  0   ??? vardenafil (LEVITRA) 10 mg tablet Take 10 mg by mouth as needed. One hour prior to desired affect  30 Tab  1   ??? lisinopril-hydrochlorothiazide (PRINZIDE, ZESTORETIC) 20-12.5 mg per tablet Take  by mouth daily.       ??? multivitamin (ONE A DAY) tablet Take 1 Tab by mouth daily.           Filed Vitals:    02/21/12 1444   BP: 90/70   Pulse: 110   Temp: 98.4 ??F (36.9 ??C)   Resp:  16   Height: 5\' 10"  (1.778 m)   .  There is no weight on file to calculate BMI.            Physical Exam   Nursing note and vitals reviewed.  Constitutional: He is oriented to person, place, and time. He appears well-developed and well-nourished. No distress.   HENT:   Head: Normocephalic and atraumatic.   Right Ear: External ear normal.   Left Ear: External ear normal.   Nose: Nose normal.   Mouth/Throat: Oropharynx is clear and moist. No oropharyngeal exudate.   Eyes: Conjunctivae are normal. Pupils are equal, round, and reactive to light. No scleral icterus.   Neck: Normal range of motion. Neck supple. No JVD present. No tracheal deviation present.   Cardiovascular: Normal rate, regular rhythm, normal heart sounds and intact distal pulses.  Exam reveals no gallop and no friction rub.    No murmur heard.  Pulmonary/Chest: Effort normal and breath sounds normal. No respiratory distress.   Abdominal: Soft. Bowel sounds are normal. He exhibits no distension and no mass. There is no tenderness.   Musculoskeletal: Normal range of motion. He exhibits no edema.   Lymphadenopathy:     He has no cervical adenopathy.   Neurological: He is alert and oriented to person, place, and time.   Skin: Skin is warm and dry. Rash noted.   Psychiatric: He has a normal mood and affect. His behavior is normal.     Lab Results   Component Value Date/Time    Potassium 4.0 02/15/2012  3:08 PM    Bilirubin, total 1.4 02/13/2012 10:30 AM    ALT 97 02/13/2012 10:30 AM    AST 46 02/13/2012 10:30 AM    Alk. phosphatase 69 02/13/2012 10:30 AM    Protein, total 7.0 02/13/2012 10:30 AM    Albumin 3.8 02/13/2012 10:30 AM    Globulin 3.2 02/13/2012 10:30 AM    A-G Ratio 1.2 02/13/2012 10:30 AM         ASSESSMENT and  PLAN  1. Elevated LFTs  CBC WITH AUTOMATED DIFF, METABOLIC PANEL, COMPREHENSIVE, LIPID PANEL, TSH, 3RD GENERATION, HEPATITIS PANEL, ACUTE   2. Anticoagulated on Coumadin  CBC WITH AUTOMATED DIFF, METABOLIC PANEL, COMPREHENSIVE, LIPID PANEL, TSH,  3RD GENERATION, HEPATITIS PANEL, ACUTE   3. Screening for cholesterol level  CBC WITH AUTOMATED DIFF, METABOLIC PANEL, COMPREHENSIVE, LIPID PANEL, TSH, 3RD GENERATION, HEPATITIS PANEL, ACUTE   4. Transaminitis  HEPATITIS PANEL, ACUTE     Follow-up Disposition: Not on File  lab results and schedule of future lab studies reviewed with patient  reviewed diet, exercise and weight control  reviewed medications and side effects in detail

## 2012-02-24 ENCOUNTER — Encounter

## 2012-02-29 LAB — CBC WITH AUTOMATED DIFF
ABS. BASOPHILS: 0 10*3/uL (ref 0.0–0.1)
ABS. EOSINOPHILS: 0.3 10*3/uL (ref 0.0–0.5)
ABS. LYMPHOCYTES: 1 10*3/uL (ref 0.8–3.5)
ABS. MONOCYTES: 0.5 10*3/uL — ABNORMAL LOW (ref 0.8–3.5)
ABS. NEUTROPHILS: 4.2 10*3/uL (ref 1.5–8.0)
BASOPHILS: 0 % (ref 0–2)
EOSINOPHILS: 5 % (ref 0–5)
HCT: 42.4 % (ref 41–53)
HGB: 15.1 g/dL (ref 13.5–17.5)
LYMPHOCYTES: 17 % — ABNORMAL LOW (ref 19–48)
MCH: 34.7 PG — ABNORMAL HIGH (ref 27–31)
MCHC: 35.6 g/dL (ref 31–37)
MCV: 97.5 FL (ref 78–98)
MONOCYTES: 8 % (ref 3–9)
MPV: 10.2 FL (ref 5.9–10.3)
NEUTROPHILS: 70 % (ref 40–74)
PLATELET: 127 10*3/uL — ABNORMAL LOW (ref 130–400)
RBC: 4.35 M/uL — ABNORMAL LOW (ref 4.7–6.1)
RDW: 13.5 % (ref 11.5–14.5)
WBC: 6.1 10*3/uL (ref 4.5–10.8)

## 2012-02-29 LAB — METABOLIC PANEL, COMPREHENSIVE
A-G Ratio: 1.4 (ref 1.2–2.2)
ALT (SGPT): 57 U/L (ref 12–78)
AST (SGOT): 32 U/L (ref 15–37)
Albumin: 4 g/dL (ref 3.4–5.0)
Alk. phosphatase: 77 U/L (ref 50–136)
Anion gap: 7 mmol/L (ref 6–15)
BUN/Creatinine ratio: 18 (ref 7–25)
BUN: 14 MG/DL (ref 7–18)
Bilirubin, total: 1.6 MG/DL — ABNORMAL HIGH (ref <1.1)
CO2: 27 MMOL/L (ref 21–32)
Calcium: 9.7 MG/DL (ref 8.5–10.1)
Chloride: 103 MMOL/L (ref 98–107)
Creatinine: 0.8 MG/DL (ref 0.60–1.30)
GFR est AA: >60 mL/min/{1.73_m2} (ref >60)
GFR est non-AA: >60 mL/min/{1.73_m2} (ref >60)
Globulin: 2.9 g/dL (ref 2.4–3.5)
Glucose: 89 MG/DL (ref 70–110)
Potassium: 4.4 MMOL/L (ref 3.5–5.3)
Protein, total: 6.9 g/dL (ref 6.4–8.2)
Sodium: 137 MMOL/L (ref 136–145)

## 2012-02-29 LAB — HEPATITIS PANEL, ACUTE
Hep B Surface Ag: NONREACTIVE
Hepatitis A, IgM: NONREACTIVE
Hepatitis B core, IgM: NONREACTIVE
Hepatitis C virus Ab: NONREACTIVE

## 2012-02-29 LAB — LIPID PANEL
Cholesterol, total: 142 MG/DL (ref <200)
HDL Cholesterol: 30 MG/DL — ABNORMAL LOW (ref 32–96)
LDL, calculated: 84.64 MG/DL (ref <130)
Triglyceride: 171 MG/DL — ABNORMAL HIGH (ref <150)
VLDL, calculated: 34.2 MG/DL — ABNORMAL HIGH (ref 5–32)

## 2012-02-29 LAB — TSH 3RD GENERATION: TSH: 1.32 u[IU]/mL (ref 0.35–3.74)

## 2012-02-29 NOTE — Progress Notes (Addendum)
.  tr labs were collected on patient @ 9;00 am per Dr. Hyacinth Meeker patient was fasting at this time.  1-purple tube  1-green tube   1-yellow tube

## 2012-03-01 LAB — PSA, DIAGNOSTIC (PROSTATE SPECIFIC AG): Prostate Specific Ag: 3.8 ng/mL (ref 0.0–4.0)

## 2012-03-01 NOTE — Progress Notes (Signed)
Patient said he would come in today and pick up a copy of his blood work

## 2012-03-01 NOTE — Progress Notes (Signed)
Quick Note:    Please let pt know liver enzymes are normal  Can come in an get copy or we can mail them to him or fax to his dermatologist   Donnella Bi, DO    ______

## 2012-05-04 NOTE — Progress Notes (Signed)
04/30/2012 INR 2 currently taking 6 mg daily

## 2012-05-09 LAB — HEPATIC FUNCTION PANEL
A-G Ratio: 1.4 (ref 1.2–2.2)
ALT (SGPT): 32 U/L (ref 12–78)
AST (SGOT): 22 U/L (ref 15–37)
Albumin: 4 g/dL (ref 3.4–5.0)
Alk. phosphatase: 86 U/L (ref 50–136)
Bilirubin, direct: 0.2 MG/DL (ref 0.0–0.2)
Bilirubin, total: 0.8 MG/DL (ref <1.1)
Globulin: 2.8 g/dL (ref 2.4–3.5)
Protein, total: 6.8 g/dL (ref 6.4–8.2)

## 2012-05-09 NOTE — Progress Notes (Signed)
Quick Note:    Please fax to dr. Ross Ludwig office Dermatologist's office  ______

## 2012-05-09 NOTE — Progress Notes (Signed)
.  tr labs were collected on patient @ 940 am per Dr. Hyacinth Meeker   1-green tube

## 2012-06-26 LAB — HEPATIC FUNCTION PANEL
A-G Ratio: 1.4 (ref 1.2–2.2)
ALT (SGPT): 35 U/L (ref 12–78)
AST (SGOT): 35 U/L (ref 15–37)
Albumin: 4 g/dL (ref 3.4–5.0)
Alk. phosphatase: 97 U/L (ref 50–136)
Bilirubin, direct: 0.4 MG/DL — ABNORMAL HIGH (ref 0.0–0.2)
Bilirubin, total: 1.5 MG/DL — ABNORMAL HIGH (ref <1.1)
Globulin: 2.9 g/dL (ref 2.4–3.5)
Protein, total: 6.9 g/dL (ref 6.4–8.2)

## 2012-07-03 NOTE — Telephone Encounter (Signed)
Since yesterday has been on 7.5mg . Before that it was 5mg 

## 2012-07-03 NOTE — Telephone Encounter (Signed)
mdi calling with a inr of 1.6 as os 01/06.  [phone]

## 2012-07-05 NOTE — Telephone Encounter (Signed)
Spoke with Dr Hyacinth Meeker and she said patient was contacted.

## 2012-07-12 NOTE — Progress Notes (Signed)
Nurse updated flow sheet

## 2012-07-27 NOTE — Telephone Encounter (Signed)
inr 1.5 today was taking 7.5  Every third day and 5mg  all other days. He took 7.5 yesterday and after checking inr today took 7.5

## 2012-08-13 NOTE — Progress Notes (Signed)
HISTORY OF PRESENT ILLNESS  Julian Bailey is a 67 y.o. male.    has a past medical history of Hypertension; Arthritis; BPH (benign prostatic hyperplasia); DVT (deep venous thrombosis) (x 2 2009  and 2011 ); ED (erectile dysfunction); and Thromboembolus.    Chief Complaint   Patient presents with   ??? Anticoagulation     INR 4.O alternating 7.5 mg and 5 mg daily   ??? Shoulder Pain     right shoulder         HPI    Patient presents for evaluation of Right shoulder pain for 3 week(s).  Pain is located in right shoulder, at area of [facility] joint and down through deltoid and bicipital groove, is described as aching, sharp, throbbing, and is intermittent .  Associated symptoms include: decreased ROM, tenderness.  The patient has tried rest, NSAIDs or cold/icing for pain, with minimal relief.  Related to injury:   Yes. Walking in a dark room, tripped, grabbed something as he fell in full abduction and "heard a pop" and felt a "catch"     Anticoagulation  Patient here for followup of chronic anticoagulation.  Indication: DVT  Bleeding Signs/Symptoms:  None  Thromboembolic Signs/Symptoms:  None  Missed Coumadin Doses:  None  Medication Changes:  no  Dietary Changes:  yes - was on vacation drank a little more alcohol in [place]   Bacterial/Viral Infection:  no  Coumadin Flowsheet Values Recorded Today:  4.0       ROS  Past Medical History   Diagnosis Date   ??? Hypertension    ??? Arthritis    ??? BPH (benign prostatic hyperplasia)    ??? DVT (deep venous thrombosis) x 2 2009  and 2011      after knee surgery then after long car ride    ??? ED (erectile dysfunction)    ??? Thromboembolus      Past Surgical History   Procedure Laterality Date   ??? 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 Hx endoscopy       colonoscopy last was in 2008    ??? Hx orthopaedic       Family History   Problem Relation Age of Onset   ??? Arthritis-osteo Mother    ??? Hypertension Mother    ??? Arthritis-osteo Father    ??? Arthritis-osteo Brother    ??? Cancer Paternal Uncle       unknown   ??? Cancer Paternal Uncle      stomach   ??? Diabetes Paternal Uncle      History     Social History   ??? Marital Status: SINGLE     Spouse Name: N/A     Number of Children: N/A   ??? Years of Education: N/A     Occupational History   ??? Not on file.     Social History Main Topics   ??? Smoking status: Former Smoker -- 0.50 packs/day for 20 years     Quit date: 11/30/1984   ??? Smokeless tobacco: Not on file   ??? Alcohol Use: Yes      Comment: 3 glasses of wine "couple of drinks"    ??? Drug Use: No   ??? Sexually Active: Yes -- Male partner(s)     Other Topics Concern   ??? Blood Transfusions No     Social History Narrative   ??? No narrative on file     No Known Allergies  Current Outpatient  Prescriptions on File Prior to Visit   Medication Sig Dispense Refill   ??? warfarin (COUMADIN) 5 mg tablet Take 1.5 Tabs by mouth daily. 5 mg Thursday thru Monday, 2.5 mg on Tuesday and Wednesday.  30 Tab  6   ??? warfarin (COUMADIN) 1 mg tablet Take 1 Tab by mouth daily.  30 Tab  5   ??? aspirin 81 mg tablet Take 81 mg by mouth daily.       ??? fexofenadine (ALLEGRA) 180 mg tablet Take  by mouth daily.       ??? lisinopril-hydrochlorothiazide (PRINZIDE, ZESTORETIC) 20-12.5 mg per tablet Take  by mouth daily.       ??? multivitamin (ONE A DAY) tablet Take 1 Tab by mouth daily.       ??? vardenafil (LEVITRA) 10 mg tablet Take 10 mg by mouth as needed. One hour prior to desired affect  30 Tab  1     No current facility-administered medications on file prior to visit.       Filed Vitals:    08/13/12 1316   BP: 138/86   Pulse: 108   Temp: 97.2 ??F (36.2 ??C)   TempSrc: Temporal   Resp: 18   Height: 5\' 10"  (1.778 m)   Weight: 204 lb (92.534 kg)   SpO2: 98%   .  Body mass index is 29.27 kg/(m^2).          Physical Exam   Musculoskeletal:        Right shoulder: He exhibits decreased range of motion, tenderness and pain. He exhibits no swelling, no effusion, no crepitus, no deformity, no laceration, no spasm and normal pulse.        Arms:  Neurological:  No sensory deficit.   Unable to fully check strength die to pain        General: NAD. A x O x 3     Skin: no evidence of rashes, scars, or moles    HEENT: Head: atraumatic normocephalic, Eyes:  PERLA, EOMI Ears: without discharge or pain of external exam Nares: patent Throat: with out erythema     Neck: no thyromegaly    Heart:  RRR, S1S2, no murmur, gallop, or rub    Lungs:   CTA bilaterally     Abdomen:  Soft, nontender, BS x 4        ASSESSMENT and PLAN  1. Right shoulder pain  MRI SHOULDER RT WO CONT   2. Rotator cuff injury  MRI SHOULDER RT WO CONT   3. Supratherapeutic INR       Pt does not want any medications   Hold coumadin until INR below 3 (has home machine) then 5 mg daily recheck in one week after restarting     Follow-up Disposition:  Return if symptoms worsen or fail to improve, for after MRI.  lab results and schedule of future lab studies reviewed with patient  reviewed diet, exercise and weight control  reviewed medications and side effects in detail    I have discussed the above plan with the patient. Patient has also been given instructions and information on his conditions.  Patient is aware of all possible side effects and agrees with the above mentioned plan.       Donnella Bi, DO

## 2012-08-14 NOTE — Progress Notes (Signed)
08/16/12 @ 9:00am @ [facility] for MRI shoulder rt w/o. Pt ntfd

## 2012-08-20 NOTE — Telephone Encounter (Signed)
MD/INR called, pt INR 1.2

## 2012-08-20 NOTE — Progress Notes (Signed)
Pt inr 1.2 taking 2 mg daily, pt instructed to take 5 mg everyday and recheck Friday per Dr. Hyacinth Meeker.

## 2012-08-20 NOTE — Progress Notes (Signed)
Quick Note:    Pt does not have preference of orthopedist.  ______

## 2012-08-20 NOTE — Progress Notes (Signed)
Mri rt shoulder completed without complication. Julian Bailey J Bradly Sangiovanni

## 2012-08-20 NOTE — Progress Notes (Signed)
Quick Note:    Pt has multiple tears in his rotator cuff seen on MRI. He needs to see an orthopedist, does pt have a preference?  Donnella Bi, DO    ______

## 2012-08-21 ENCOUNTER — Encounter

## 2012-08-21 NOTE — Progress Notes (Signed)
Quick Note:    Referral made  Donnella Bi, DO    ______

## 2012-09-12 NOTE — Telephone Encounter (Signed)
MD/INR called pts INR 1.8

## 2012-09-25 NOTE — Telephone Encounter (Signed)
MD INR called pts INR 3.9, called pt said he has been taking 6 mg Coumadin daily, said he thx he should go back to old dosage of taking 6 mg on Tues, Thurs, Sat, and 5 mg Mond, Wed, Frid, Sunday.

## 2012-10-19 NOTE — Telephone Encounter (Signed)
MD INR called said pts INR 1.8, spoke with pt suppose to be alternating 5 mg and 6 mg daily but says he messed up and took 5 mg 3 days in a row and that he will keep alternating "and this should fix it" and recheck his INR Tuesday and call with results.

## 2012-10-29 ENCOUNTER — Telehealth

## 2012-10-29 NOTE — Telephone Encounter (Signed)
Patient reports he is taking lisinopril/hctz 20/12.5 mg that was prescribed by another doctor. Wants to know if you would prescribe this for him. [employer]

## 2012-10-30 ENCOUNTER — Encounter

## 2012-10-30 NOTE — Telephone Encounter (Signed)
Patient notified

## 2012-10-31 NOTE — Progress Notes (Signed)
Labs were collected @ 150 pm per Dr. Hyacinth Meeker,   1-purple tube  1-green tube

## 2012-10-31 NOTE — Progress Notes (Signed)
HISTORY OF PRESENT ILLNESS  Julian Bailey is a 67 y.o. male.    has a past medical history of Hypertension; Arthritis; BPH (benign prostatic hyperplasia); DVT (deep venous thrombosis) (x 2 2009  and 2011 ); ED (erectile dysfunction); and Thromboembolus.    Chief Complaint   Patient presents with   ??? Sore     left bottom of calf, leaving this friday for [place]   ??? Hypertension       HPI  The patient is a very pleasant 67 year old white male with past medical history of hypertension, arthritis, BPH, DVT, and erectile dysfunction who presents to the office today for regular follow-up of hypertension and to address a sore that has appeared on the bottom of his left calf.      The patient presents for a new onset sore on the bottom of his left calf.  He states that it began approximately 2 to 3 weeks ago at what appeared to look like a bug bite however it did not itch and it was tender.  He states that approximately a week ago it opened up and  Drained purulent material and now there is black crusting has formed.  The patient has a history of chronic venous stasis and varicosities.  He does not remember having an open cut in that area or any type of infected hair.  The patient does not wear his support stockings regularly.  The patient is about to leave to go out of town and is not willing at this time to go to Wound Care to have it assessed.  The patient is willing to have antibiotics that he will take with him to [place] but again the patient understands the consequences of not having what has become a chronic ulceration treated with wound care.  The patient has been using over-the-counter Neosporin which he states has seemed to "keep it at bay but hasn't healed it."   No exudative material.  No pain in his actual calf.  Negative Homans sign.  No fevers, chills, nausea, or vomiting.          Hypertension  Patient is here for follow-up of hypertension.  He indicates that he is feeling well and is not having any  symptoms referable to his hypertension (see ROS). He is exercising and is, "trying to be adherent"  adherent to low salt diet.  Blood pressure is not being checked  at home.   BP Readings from Last 3 Encounters:   10/31/12 118/76   08/13/12 138/86   02/21/12 90/70         Review of Systems   Constitutional: Negative for fever, chills, weight loss, malaise/fatigue and diaphoresis.   HENT: Negative for congestion and sore throat.    Eyes: Negative for blurred vision and photophobia.   Respiratory: Negative for cough and shortness of breath.    Cardiovascular: Negative for chest pain, palpitations and leg swelling.   Gastrointestinal: Negative for heartburn, nausea, vomiting, abdominal pain, diarrhea, constipation, blood in stool and melena.   Genitourinary: Negative for dysuria.   Musculoskeletal: Negative for myalgias.   Skin:        Per hpi   Neurological: Negative for dizziness, loss of consciousness, weakness and headaches.   Psychiatric/Behavioral: Negative.      Past Medical History   Diagnosis Date   ??? Hypertension    ??? Arthritis    ??? BPH (benign prostatic hyperplasia)    ??? DVT (deep venous thrombosis) x 2 2009  and 2011      after knee surgery then after long car ride    ??? ED (erectile dysfunction)    ??? Thromboembolus      Past Surgical History   Procedure Laterality Date   ??? 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 Hx endoscopy       colonoscopy last was in 2008    ??? Hx orthopaedic       Family History   Problem Relation Age of Onset   ??? Arthritis-osteo Mother    ??? Hypertension Mother    ??? Arthritis-osteo Father    ??? Arthritis-osteo Brother    ??? Cancer Paternal Uncle      unknown   ??? Cancer Paternal Uncle      stomach   ??? Diabetes Paternal Uncle      History     Social History   ??? Marital Status: SINGLE     Spouse Name: N/A     Number of Children: N/A   ??? Years of Education: N/A     Occupational History   ??? Not on file.     Social History Main Topics   ??? Smoking status: Former Smoker -- 0.50 packs/day  for 20 years     Quit date: 11/30/1984   ??? Smokeless tobacco: Not on file   ??? Alcohol Use: Yes      Comment: 3 glasses of wine "couple of drinks"    ??? Drug Use: No   ??? Sexually Active: Yes -- Male partner(s)     Other Topics Concern   ??? Blood Transfusions No     Social History Narrative   ??? No narrative on file     No Known Allergies  Current Outpatient Prescriptions on File Prior to Visit   Medication Sig Dispense Refill   ??? warfarin (COUMADIN) 5 mg tablet Take 1 Tab by mouth daily. 5 mg Thursday thru Monday, 2.5 mg on Tuesday and Wednesday.  30 Tab  6   ??? warfarin (COUMADIN) 1 mg tablet Take 1 Tab by mouth daily.  30 Tab  5   ??? aspirin 81 mg tablet Take 81 mg by mouth daily.       ??? fexofenadine (ALLEGRA) 180 mg tablet Take  by mouth daily.       ??? vardenafil (LEVITRA) 10 mg tablet Take 10 mg by mouth as needed. One hour prior to desired affect  30 Tab  1   ??? multivitamin (ONE A DAY) tablet Take 1 Tab by mouth daily.         No current facility-administered medications on file prior to visit.       Filed Vitals:    10/31/12 1313   BP: 118/76   Pulse: 116   Temp: 97.4 ??F (36.3 ??C)   TempSrc: Temporal   Resp: 18   Height: 5\' 10"  (1.778 m)   Weight: 204 lb (92.534 kg)   SpO2: 97%   .  Body mass index is 29.27 kg/(m^2).            Physical Exam   Nursing note and vitals reviewed.  Constitutional: He is oriented to person, place, and time. He appears well-developed and well-nourished. No distress.   HENT:   Head: Normocephalic and atraumatic.   Right Ear: External ear normal.   Left Ear: External ear normal.   Nose: Nose normal.   Mouth/Throat: Oropharynx is clear and moist. No oropharyngeal exudate.  Eyes: Conjunctivae are normal. Pupils are equal, round, and reactive to light. No scleral icterus.   Neck: Normal range of motion. Neck supple. No JVD present. No tracheal deviation present.   Cardiovascular: Normal rate, regular rhythm, normal heart sounds and intact distal pulses.  Exam reveals no gallop and no  friction rub.    No murmur heard.  Pulmonary/Chest: Effort normal and breath sounds normal. No respiratory distress.   Abdominal: Soft. Bowel sounds are normal. He exhibits no distension and no mass. There is no tenderness.   Musculoskeletal: Normal range of motion. He exhibits no edema.   Lymphadenopathy:     He has no cervical adenopathy.   Neurological: He is alert and oriented to person, place, and time.   Skin: Skin is warm and dry.   3 cm diameter of erythema with 1 cm of black eschar   Painful to palpation    +varicosities and hemosiderin pigmentation    Psychiatric: He has a normal mood and affect. His behavior is normal.       ASSESSMENT and PLAN    ICD-9-CM    1. Infected decubitus ulcer 707.00 clindamycin (CLEOCIN) 300 mg capsule     mupirocin (BACTROBAN) 2 % ointment  If not does not resolve come back as soon as you get back from vacation for follow-up  Pt chooses to defer wound care   follow-up as soon as he gets back form [place]   2. HTN (hypertension) 401.9 CBC WITH AUTOMATED DIFF     METABOLIC PANEL, COMPREHENSIVE     lisinopril-hydrochlorothiazide (PRINZIDE, ZESTORETIC) 20-12.5 mg per tablet   GO TO ED IF SYMPTOMS WORSEN   Follow-up Disposition: as soon as he gets back from [place]  HTN 6 months   lab results and schedule of future lab studies reviewed with patient  reviewed medications and side effects in detail    I have discussed the above plan with the patient. Patient has also been given instructions and information on his conditions.  Patient is aware of all possible side effects and agrees with the above mentioned plan.     Donnella Bi, DO

## 2012-11-01 LAB — CBC WITH AUTOMATED DIFF
ABS. BASOPHILS: 0 10*3/uL (ref 0.0–0.1)
ABS. EOSINOPHILS: 0.2 10*3/uL (ref 0.0–0.5)
ABS. LYMPHOCYTES: 1 10*3/uL (ref 0.8–3.5)
ABS. MONOCYTES: 0.6 10*3/uL — ABNORMAL LOW (ref 0.8–3.5)
ABS. NEUTROPHILS: 5.8 10*3/uL (ref 1.5–8.0)
BASOPHILS: 0 % (ref 0–2)
EOSINOPHILS: 2 % (ref 0–5)
HCT: 44 % (ref 41–53)
HGB: 15.2 g/dL (ref 13.5–17.5)
LYMPHOCYTES: 13 % — ABNORMAL LOW (ref 19–48)
MCH: 33.2 PG — ABNORMAL HIGH (ref 27–31)
MCHC: 34.5 g/dL (ref 31–37)
MCV: 96.1 FL (ref 80–100)
MONOCYTES: 8 % (ref 3–9)
MPV: 10.1 FL (ref 5.9–10.3)
NEUTROPHILS: 77 % — ABNORMAL HIGH (ref 40–74)
PLATELET: 175 10*3/uL (ref 130–400)
RBC: 4.58 M/uL — ABNORMAL LOW (ref 4.7–6.1)
RDW: 15.1 % — ABNORMAL HIGH (ref 11.5–14.5)
WBC: 7.6 10*3/uL (ref 4.5–10.8)

## 2012-11-01 LAB — METABOLIC PANEL, COMPREHENSIVE
A-G Ratio: 1.4 (ref 1.2–2.2)
ALT (SGPT): 36 U/L (ref 12–78)
AST (SGOT): 27 U/L (ref 15–37)
Albumin: 4.2 g/dL (ref 3.4–5.0)
Alk. phosphatase: 103 U/L (ref 50–136)
Anion gap: 8 mmol/L (ref 6–15)
BUN/Creatinine ratio: 13 (ref 7–25)
BUN: 13 MG/DL (ref 7–18)
Bilirubin, total: 1.6 MG/DL — ABNORMAL HIGH (ref <1.1)
CO2: 28 mmol/L (ref 21–32)
Calcium: 9.7 MG/DL (ref 8.5–10.1)
Chloride: 100 mmol/L (ref 98–107)
Creatinine: 1 MG/DL (ref 0.60–1.30)
GFR est AA: >60 mL/min/{1.73_m2} (ref >60)
GFR est non-AA: >60 mL/min/{1.73_m2} (ref >60)
Globulin: 2.9 g/dL (ref 2.4–3.5)
Glucose: 74 mg/dL (ref 70–110)
Potassium: 4 mmol/L (ref 3.5–5.3)
Protein, total: 7.1 g/dL (ref 6.4–8.2)
Sodium: 136 mmol/L (ref 136–145)

## 2012-12-21 NOTE — Progress Notes (Signed)
HISTORY OF PRESENT ILLNESS  Julian Bailey is a 67 y.o. male.    has a past medical history of Hypertension; Arthritis; BPH (benign prostatic hyperplasia); DVT (deep venous thrombosis) (x 2 2009  and 2011 ); ED (erectile dysfunction); and Thromboembolus.    Chief Complaint   Patient presents with   ??? Ulcer     left lower leg       HPI  The patient is a very pleasant 67 year old white male with past medical history of hypertension, arthritis, BPH, DVT, and erectile dysfunction who presents to the office today for regular follow-up of sore that has appeared on the bottom of his left calf.      The patient presents for a  sore on the bottom of his left calf x a few months.  He states that it began with what appeared to look like a bug bite however it did not itch and it was tender.  He states that approximately after that it opened up and  Drained purulent material and now there is black crusting has formed.  The patient has a history of chronic venous stasis and varicosities.  He does not remember having an open cut in that area or any type of infected hair.  The patient does not wear his support stockings regularly.     No exudative material.  No pain in his actual calf.  Negative Homans sign.  No fevers, chills, nausea, or vomiting.  States dermatologist cultured it and it "didnt grow anything" records release signed. Clindamycin and bactroban ointment did not help it.         Review of Systems   Constitutional: Negative for fever, chills, weight loss, malaise/fatigue and diaphoresis.   HENT: Negative for congestion and sore throat.    Eyes: Negative for blurred vision and photophobia.   Respiratory: Negative for cough and shortness of breath.    Cardiovascular: Negative for chest pain, palpitations and leg swelling.   Gastrointestinal: Negative for heartburn, nausea, vomiting, abdominal pain, diarrhea, constipation, blood in stool and melena.   Genitourinary: Negative for dysuria.   Musculoskeletal: Negative for  myalgias.   Skin:        Per hpi   Neurological: Negative for dizziness, loss of consciousness, weakness and headaches.   Psychiatric/Behavioral: Negative.      Past Medical History   Diagnosis Date   ??? Hypertension    ??? Arthritis    ??? BPH (benign prostatic hyperplasia)    ??? DVT (deep venous thrombosis) x 2 2009  and 2011      after knee surgery then after long car ride    ??? ED (erectile dysfunction)    ??? Thromboembolus      Past Surgical History   Procedure Laterality Date   ??? 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 Hx endoscopy       colonoscopy last was in 2008    ??? Hx orthopaedic       Family History   Problem Relation Age of Onset   ??? Arthritis-osteo Mother    ??? Hypertension Mother    ??? Arthritis-osteo Father    ??? Arthritis-osteo Brother    ??? Cancer Paternal Uncle      unknown   ??? Cancer Paternal Uncle      stomach   ??? Diabetes Paternal Uncle      History     Social History   ??? Marital Status: SINGLE  Spouse Name: N/A     Number of Children: N/A   ??? Years of Education: N/A     Occupational History   ??? Not on file.     Social History Main Topics   ??? Smoking status: Former Smoker -- 0.50 packs/day for 20 years     Quit date: 11/30/1984   ??? Smokeless tobacco: Not on file   ??? Alcohol Use: Yes      Comment: 3 glasses of wine "couple of drinks"    ??? Drug Use: No   ??? Sexually Active: Yes -- Male partner(s)     Other Topics Concern   ??? Blood Transfusions No     Social History Narrative   ??? No narrative on file     No Known Allergies  Current Outpatient Prescriptions on File Prior to Visit   Medication Sig Dispense Refill   ??? lisinopril-hydrochlorothiazide (PRINZIDE, ZESTORETIC) 20-12.5 mg per tablet Take 1 Tab by mouth daily.  90 Tab  1   ??? warfarin (COUMADIN) 5 mg tablet Take 1 Tab by mouth daily. 5 mg Thursday thru Monday, 2.5 mg on Tuesday and Wednesday.  30 Tab  6   ??? warfarin (COUMADIN) 1 mg tablet Take 1 Tab by mouth daily.  30 Tab  5   ??? aspirin 81 mg tablet Take 81 mg by mouth daily.       ???  fexofenadine (ALLEGRA) 180 mg tablet Take  by mouth daily.       ??? vardenafil (LEVITRA) 10 mg tablet Take 10 mg by mouth as needed. One hour prior to desired affect  30 Tab  1   ??? multivitamin (ONE A DAY) tablet Take 1 Tab by mouth daily.         No current facility-administered medications on file prior to visit.       Filed Vitals:    12/21/12 0842   BP: 100/60   Pulse: 75   Resp: 18   Height: 5\' 10"  (1.778 m)   Weight: 214 lb (97.07 kg)   SpO2: 98%   .  Body mass index is 30.71 kg/(m^2).            Physical Exam   Nursing note and vitals reviewed.  Constitutional: He is oriented to person, place, and time. He appears well-developed and well-nourished. No distress.   HENT:   Head: Normocephalic and atraumatic.   Right Ear: External ear normal.   Left Ear: External ear normal.   Nose: Nose normal.   Mouth/Throat: Oropharynx is clear and moist. No oropharyngeal exudate.   Eyes: Conjunctivae are normal. Pupils are equal, round, and reactive to light. No scleral icterus.   Neck: Normal range of motion. Neck supple. No JVD present. No tracheal deviation present.   Cardiovascular: Normal rate, regular rhythm, normal heart sounds and intact distal pulses.  Exam reveals no gallop and no friction rub.    No murmur heard.  Pulmonary/Chest: Effort normal and breath sounds normal. No respiratory distress.   Abdominal: Soft. Bowel sounds are normal. He exhibits no distension and no mass. There is no tenderness.   Musculoskeletal: Normal range of motion. He exhibits no edema.   Lymphadenopathy:     He has no cervical adenopathy.   Neurological: He is alert and oriented to person, place, and time.   Skin: Skin is warm and dry.   3 cm diameter of erythema with 1 cm of black eschar   Painful to palpation    +varicosities and  hemosiderin pigmentation    Psychiatric: He has a normal mood and affect. His behavior is normal.       ASSESSMENT and PLAN    ICD-9-CM    1. 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 Patient has also been given instructions and information on his conditions.  Patient is aware of all possible side effects and agrees with the above mentioned plan.     Donnella Bi, DO

## 2012-12-21 NOTE — Patient Instructions (Signed)
[facility]  Office working hours are Monday - Friday 8:30 am to 4 pm.  Saturday 9 am to 12 pm for acute illnesses only for established patients.  Office phone number is [phone] and fax is [phone].  For non-emergent medical care and clinical advice during office hours:   1. Call office or   2.  Send message or request using MyChart  For non-emergent medical care and clinical advice after office hours:  1.  Call [phone] or  2.  Send message or request using MyChart  Emergency care can be obtained at the [facility] ER, Urgent Care or calling 911.    Patient Satisfaction Survey  We appreciate you giving us your e-mail address.  Please watch for our patient satisfaction survey which you will receive by e-mail.  We strive to provide you with the best care possible.  We respect all comments and will take comments into consideration to improve our service.  Thank you for your participation.

## 2013-01-01 NOTE — Telephone Encounter (Signed)
PT INR 1.8, pt taking 6 mg daily, pt says he is going to increase it to 7 mg daily.

## 2013-01-01 NOTE — Telephone Encounter (Signed)
PT PHONE SAID HE IN UNREACHABLE. WILL TRY AGAIN TOMORROW.

## 2013-01-02 NOTE — Telephone Encounter (Signed)
Pt notified.

## 2013-01-21 NOTE — Telephone Encounter (Signed)
MD INR called PTS INR 3.7

## 2013-02-08 NOTE — Patient Instructions (Signed)
[facility]  Office working hours are Monday - Friday 8:30 am to 4 pm.  Saturday 9 am to 12 pm for acute illnesses only for established patients.  Office phone number is [phone] and fax is [phone].  For non-emergent medical care and clinical advice during office hours:   1. Call office or   2.  Send message or request using MyChart  For non-emergent medical care and clinical advice after office hours:  1.  Call [phone] or  2.  Send message or request using MyChart  Emergency care can be obtained at the [facility] ER, Urgent Care or calling 911.    Patient Satisfaction Survey  We appreciate you giving us your e-mail address.  Please watch for our patient satisfaction survey which you will receive by e-mail.  We strive to provide you with the best care possible.  We respect all comments and will take comments into consideration to improve our service.  Thank you for your participation.

## 2013-02-09 NOTE — Progress Notes (Signed)
Anticoagulation  Patient here for followup of chronic anticoagulation.  Indication: DVT - multiple   Bleeding Signs/Symptoms:  None  Thromboembolic Signs/Symptoms:  None  Missed Coumadin Doses:  None  Medication Changes:  yes -   Dietary Changes:  yes - "lots of kale"   Bacterial/Viral Infection:  no  Coumadin Flowsheet Values Recorded Today:    1.5    Current dose alternating 5 and 6   Discussed if pt can not be compliant with low vitamin K diet then he needs to eat a consistent and reasonable serving every day so that I can regulate the medication to his diet     Increase dose to 6 mg daily   Recheck in one week    I have. Reviewed with patient the treatment plan, goals of treatment plan, and limitations of treatment plan, to include the potential for side effects from medications.  If side effects occur, it is the responsibility of the patient to inform the clinic so that a change in the treatment plan can be made in a safe manner.  The patient is informed an emergency room evaluation may be necessary if this occurs  Pt voices understanding and verbally agrees,     Discussed with patient that medical noncompliance can have serious consequences to patients health including hospitalization or even death.     Donnella Bi, DO

## 2013-02-11 LAB — AMB POC PT/INR: INR POC: 1.5

## 2013-05-13 NOTE — Patient Instructions (Signed)
Varicella Vaccine: After Your Visit  Your Care Instructions  The varicella vaccine protects you from getting infected with the varicella virus. Many people know this virus by the name chickenpox.  Chickenpox causes an itchy rash and red spots or blisters all over the body. It is most common in children. But most people will get it if they don't get the vaccine.  The vaccine is given as two separate shots. It's recommended for all children 12 months or older who have not had the virus yet. The first shot is given to children when they are 70 to 65 months old. The second one is usually given when a child is 11 to 74 years old. But some children get it sooner. Many states make you prove that your child got this shot before he or can start day care or school.  In teens and adults, a chickenpox infection can be very serious. So it's important for children, teens, and adults to get the vaccine if they haven't had chickenpox yet. People 13 or older also get two shots. The second one is given at least 4 weeks after the first one.  The shots can make the arm sore. They can also make children fussy for a short time.  You or your child may get the chickenpox vaccine as its own shot. Or you may get an MMRV vaccine. It gives the varicella, measles, mumps, and rubella vaccine together in one shot.  Follow-up care is a key part of your treatment and safety. Be sure to make and go to all appointments, and call your doctor if you are having problems. It's also a good idea to know your test results and keep a list of the medicines you take.  How can you care for yourself at home?  ?? Take an over-the-counter pain medicine, such as acetaminophen (Tylenol), ibuprofen (Advil, Motrin), or naproxen (Aleve), if your arm is sore. Be safe with medicines. Read and follow all instructions on the label.  ?? Give acetaminophen (Tylenol) or ibuprofen (Advil, Motrin) to your child for pain or fussiness. Read and follow all instructions on the label.  Do not give aspirin to anyone younger than 20. It has been linked to Reye syndrome, a serious illness.  ?? If your child is under age 28 or weighs less than 24 pounds, follow your doctor's advice about the amount of medicine to give your child.  ?? Put ice or a cold pack on the sore area for 10 to 20 minutes at a time. Put a thin cloth between the ice and your skin.  When should you call for help?  Call 911 anytime you think you may need emergency care. For example, call if:  ?? You or your child has severe problems breathing or swallowing.  ?? You or your child has a seizure.  Call your doctor now or seek immediate medical care if:  ?? You or your child gets hives.  ?? You or your child has a high fever.  ?? You or your child gets a rash.  ?? You or your child has an unusual reaction after the shot.  Watch closely for changes in your or your child's health, and be sure to contact your doctor if you have any problems.   Where can you learn more?   Go to MetropolitanBlog.hu  Enter [phone] in the search box to learn more about "Varicella Vaccine: After Your Visit."   ?? 2006-2014 [employer]. Care instructions adapted under license by [facility] (which disclaims liability or warranty for this information). This care instruction is for use with your licensed healthcare professional. If you have questions about a medical condition or this instruction, always ask your healthcare professional. Health[employer], Incorporated disclaims any warranty or liability for your use of this information.  Content Version: 10.2.346038; Current as of: September 05, 2012              Td (Tetanus and Diphtheria) Vaccine: What You Need to Know  Why get vaccinated?  Tetanus and diphtheria are very serious diseases. They are rare in the [place] today, but people who do become infected often have severe complications. Td vaccine is used to protect adolescents and adults from both of these diseases.  Both tetanus and diphtheria  are infections caused by bacteria. Diphtheria spreads from person to person through coughing or sneezing. Tetanus-causing bacteria enter the body through cuts, scratches, or wounds.  TETANUS (Lockjaw) causes painful muscle tightening and stiffness, usually all over the body.  ?? It can lead to tightening of muscles in the head and neck so you can't open your mouth, swallow, or sometimes even breathe. Tetanus kills about 1 out of every 5 people who are infected.  DIPHTHERIA can cause a thick coating to form in the back of the throat.  ?? It can lead to breathing problems, paralysis, heart failure, and death.  Before vaccines, the [place] saw as many as 200,000 cases a year of diphtheria and hundreds of cases of tetanus. Since vaccination began, cases of both diseases have dropped by about 99%.  Td vaccine  Td vaccine can protect adolescents and adults from tetanus and diphtheria. Td is usually given as a booster dose every 10 years, but it can also be given earlier after a severe and dirty wound or burn.  Your doctor can give you more information.  Td may safely be given at the same time as other vaccines.  Which vaccine, and when?  ?? If you ever had a life-threatening allergic reaction after a dose of any tetanus or diphtheria containing vaccine, OR if you have a severe allergy to any part of this vaccine, you should not get Td. Tell your doctor if you have any severe allergies.  ?? Talk to your doctor if you:  ?? Have epilepsy or another nervous system problem.  ?? Had severe pain or swelling after any vaccine containing diphtheria or tetanus.  ?? Ever had Guillain Barr?? Syndrome (GBS).  ?? Aren't feeling well on the day the shot is scheduled.  Risks of a vaccine reaction  With a vaccine, like any medicine, there is a chance of side effects. These are usually mild and go away on their own.  Serious side effects are also possible, but are very rare.  Most people who get Td vaccine do not have any problems with it.   Mild problems, following Td  (Did not interfere with activities)   ?? Pain where the shot was given (about 8 people in 10)  ?? Redness or swelling where the shot was given (about 1 person in 3)  ?? Mild fever (about 1 person in 15)  ?? Headache or tiredness (uncommon)  Moderate problems, following Td  (Interfered with activities, but did not require medical attention)   ?? Fever over 102??F (rare)  Severe problems, following Td  (Unable to perform usual activities; required medical attention)   ?? Swelling, severe pain, bleeding and/or redness in the arm where the shot was given (rare).  Problems that could happen after any vaccine:  ?? Brief fainting spells can happen after any medical procedure, including vaccination. Sitting or lying down for about 15 minutes can help prevent fainting, and injuries caused by a fall. Tell your doctor if you feel dizzy or have vision changes or ringing in the ears.  ?? Severe shoulder pain and reduced range of motion in the arm where a shot was given can happen, very rarely, after a vaccination.  ?? Severe allergic reactions from a vaccine are very rare, estimated at less than 1 in a million doses. If one were to occur, it would usually be within a few minutes to a few hours after the vaccination.  The safety of vaccines is always being monitored. For more information, visit: CreditChaos.com.ee.  What if there is a serious reaction?  What should I look for?  ?? Look for anything that concerns you, such as signs of a severe allergic reaction, very high fever, or behavior changes.  Signs of a severe allergic reaction can include hives, swelling of the face and throat, difficulty breathing, a fast heartbeat, dizziness, and weakness. These would usually start a few minutes to a few hours after the vaccination.  What should I do?  ?? If you think it is a severe allergic reaction or other emergency that can't wait, call 9-1-1 or get the person to the nearest hospital. Otherwise, call your  doctor.  ?? Afterward, the reaction should be reported to the Vaccine Adverse Event Reporting System (VAERS). Your doctor might file this report, or you can do it yourself through the VAERS web site at www.vaers.LAgents.no, or by calling 1-915-640-7746.  VAERS is only for reporting reactions. They do not give medical advice.  The National Vaccine Injury Compensation Program  The National Vaccine Injury Compensation Program (VICP) is a federal program that was created to compensate people who may have been injured by certain vaccines.  Persons who believe they may have been injured by a vaccine can learn about the program and about filing a claim by calling 1-(716) 791-7119 or visiting the VICP website at SpiritualWord.at.  How can I learn more?  ?? Ask your doctor.  ?? Call your local or state health department.  ?? Contact the Centers for Disease Control and Prevention (CDC):  ?? Call (906)310-8345 (1-800-CDC-INFO) or  ?? Visit CDC's website at PicCapture.uy  Vaccine Information Statement (Interim)  Td Vaccine  (07/31/2012)  [place] ?? [phone]  Department of Health and Insurance risk surveyor for Disease Control and Prevention  Many Vaccine Information Statements are available in Spanish and other languages. See PromoAge.com.br.  Muchas hojas de informaci??n sobre vacunas est??n disponibles en espa??ol y en otros idiomas. Visite PromoAge.com.br.  Content Version: 10.2.346038

## 2013-05-14 NOTE — Progress Notes (Signed)
HISTORY OF PRESENT ILLNESS  Julian Bailey is a 67 y.o. male.    has a past medical history of Hypertension; Arthritis; BPH (benign prostatic hyperplasia); DVT (deep venous thrombosis) (x 2 2009  and 2011 ); ED (erectile dysfunction); and Thromboembolus.    Chief Complaint   Patient presents with   ??? Follow Up Chronic Condition         HPI  The patient is a very pleasant 67 year old white male with the above listed past medical history who presents to the office today to follow up care.  The patient presents to follow up for longstanding hypertension.  The patient does not take blood pressure at home today, in the office it is 128/78.  He denies any symptoms referable to hypertension.  This would include but is not limited to headache, chest pain, shortness of breath, nausea, vomiting and neurological deficits.  The patient states that he has not had any side effects on the medication.  He denies any cough associated with the Lisinopril and overall is doing well.      The patient presents to follow up for chronic anticoagulation on Coumadin.  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  The patient takes his PT and INR at home and is called to the office.  He has had stable levels for several months.  He is doing well, no signs of bleeding, no changes in diet, no recent illness.      The patient also presents to follow up today for longstanding elevated cholesterol.  He has not had his cholesterol checked in several months.  He has been trying to follow a diet closely.  He does intermittently exercise.  He is trying to be more active.  He is recently a newlywed.  Overall the patient states the is doing quite well.      The patient does not have known history of coronary artery disease.        Preventative Health  Pt needs up date on tdap -- requests vaccination today  Pt requests Zoster vaccination; will give script requests he a wait a week into between vaccination     Review of Systems    Constitutional: Negative for fever, chills, weight loss, malaise/fatigue and diaphoresis.   HENT: Negative for congestion and sore throat.    Eyes: Negative for blurred vision and photophobia.   Respiratory: Negative for cough and shortness of breath.    Cardiovascular: Negative for chest pain, palpitations and leg swelling.   Gastrointestinal: Negative for heartburn, nausea, vomiting, abdominal pain, diarrhea, constipation, blood in stool and melena.   Genitourinary: Negative for dysuria.   Musculoskeletal: Negative for myalgias.   Skin: Negative for rash.   Neurological: Negative for dizziness, loss of consciousness, weakness and headaches.   Psychiatric/Behavioral: Negative.      Past Medical History   Diagnosis Date   ??? Hypertension    ??? Arthritis    ??? BPH (benign prostatic hyperplasia)    ??? DVT (deep venous thrombosis) x 2 2009  and 2011      after knee surgery then after long car ride    ??? ED (erectile dysfunction)    ??? Thromboembolus      Past Surgical History   Procedure Laterality Date   ??? 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 Hx endoscopy       colonoscopy last was in 2008    ??? Hx orthopaedic  Family History   Problem Relation Age of Onset   ??? Arthritis-osteo Mother    ??? Hypertension Mother    ??? Arthritis-osteo Father    ??? Arthritis-osteo Brother    ??? Cancer Paternal Uncle      unknown   ??? Cancer Paternal Uncle      stomach   ??? Diabetes Paternal Uncle      History     Social History   ??? Marital Status: MARRIED     Spouse Name: N/A     Number of Children: N/A   ??? Years of Education: N/A     Occupational History   ??? Not on file.     Social History Main Topics   ??? Smoking status: Former Smoker -- 0.50 packs/day for 20 years     Quit date: 11/30/1984   ??? Smokeless tobacco: Not on file   ??? Alcohol Use: Yes      Comment: 3 glasses of wine "couple of drinks"    ??? Drug Use: No   ??? Sexually Active: Yes -- Male partner(s)     Other Topics Concern   ??? Blood Transfusions No     Social History  Narrative   ??? No narrative on file     No Known Allergies  Current Outpatient Prescriptions on File Prior to Visit   Medication Sig Dispense Refill   ??? fexofenadine (ALLEGRA) 180 mg tablet Take  by mouth daily.       ??? vardenafil (LEVITRA) 10 mg tablet Take 10 mg by mouth as needed. One hour prior to desired affect  30 Tab  1   ??? multivitamin (ONE A DAY) tablet Take 1 Tab by mouth daily.         No current facility-administered medications on file prior to visit.       Filed Vitals:    05/13/13 1008   BP: 128/78   Pulse: 91   Resp: 18   Height: 5\' 10"  (1.778 m)   Weight: 217 lb (98.431 kg)   SpO2: 98%   .  Body mass index is 31.14 kg/(m^2).            Physical Exam   Nursing note and vitals reviewed.  Constitutional: He is oriented to person, place, and time. He appears well-developed and well-nourished. No distress.   HENT:   Head: Normocephalic and atraumatic.   Right Ear: External ear normal.   Left Ear: External ear normal.   Nose: Nose normal.   Mouth/Throat: Oropharynx is clear and moist. No oropharyngeal exudate.   Eyes: Conjunctivae are normal. Pupils are equal, round, and reactive to light. No scleral icterus.   Neck: Normal range of motion. Neck supple. No JVD present. No tracheal deviation present.   Cardiovascular: Normal rate, regular rhythm, normal heart sounds and intact distal pulses.  Exam reveals no gallop and no friction rub.    No murmur heard.  Pulmonary/Chest: Effort normal and breath sounds normal. No respiratory distress.   Abdominal: Soft. Bowel sounds are normal. He exhibits no distension and no mass. There is no tenderness.   Musculoskeletal: Normal range of motion. He exhibits no edema.   Lymphadenopathy:     He has no cervical adenopathy.   Neurological: He is alert and oriented to person, place, and time.   Skin: Skin is warm and dry.   Psychiatric: He has a normal mood and affect. His behavior is normal.       ASSESSMENT and PLAN  ICD-9-CM    1. HTN (hypertension) 401.9  lisinopril-hydrochlorothiazide (PRINZIDE, ZESTORETIC) 20-12.5 mg per tablet     CBC WITH AUTOMATED DIFF     DISCONTINUED: varicella zoster vacine live (ZOSTAVAX) 19,400 unit/0.65 mL susr injection   2. Anticoagulated on Coumadin V58.83 warfarin (COUMADIN) 5 mg tablet     warfarin (COUMADIN) 1 mg tablet     CBC WITH AUTOMATED DIFF     DISCONTINUED: varicella zoster vacine live (ZOSTAVAX) 19,400 unit/0.65 mL susr injection   3. Need for zoster vaccination V04.89 CBC WITH AUTOMATED DIFF     varicella zoster vacine live (VARICELLA-ZOSTER VIRUS INFECTION PROPHYLAXIS) 19,400 unit/0.65 mL susr injection     DISCONTINUED: varicella zoster vacine live (ZOSTAVAX) 19,400 unit/0.65 mL susr injection   4. Need for Tdap vaccination V06.1 TETANUS, DIPHTHERIA TOXOIDS AND ACELLULAR PERTUSSIS VACCINE (TDAP), IN INDIVIDS. >=7, IM     CBC WITH AUTOMATED DIFF     DISCONTINUED: varicella zoster vacine live (ZOSTAVAX) 19,400 unit/0.65 mL susr injection   5. Screening for cholesterol level V77.91 CBC WITH AUTOMATED DIFF     DISCONTINUED: varicella zoster vacine live (ZOSTAVAX) 19,400 unit/0.65 mL susr injection   6. Dyslipidemia 272.4 CBC WITH AUTOMATED DIFF     LIPID PANEL     METABOLIC PANEL, COMPREHENSIVE     DISCONTINUED: varicella zoster vacine live (ZOSTAVAX) 19,400 unit/0.65 mL susr injection     Follow-up Disposition:  Return in about 3 months (around 08/13/2013).  lab results and schedule of future lab studies reviewed with patient  reviewed diet, exercise and weight control  cardiovascular risk and specific lipid/LDL goals reviewed  reviewed medications and side effects in detail      I have discussed the above plan with the patient. Patient has also been given instructions and information on his conditions.  Patient is aware of all possible side effects and agrees with the above mentioned plan.       Donnella Bi, DO

## 2013-09-23 LAB — LIPID PANEL
Cholesterol, total: 162 MG/DL (ref <200)
HDL Cholesterol: 46 MG/DL (ref 32–96)
LDL, calculated: 92.16 MG/DL (ref <130)
Triglyceride: 149 MG/DL (ref <150)
VLDL, calculated: 29.8 MG/DL (ref 5–32)

## 2013-09-23 LAB — CBC WITH AUTOMATED DIFF
ABS. BASOPHILS: 0 10*3/uL (ref 0.0–0.1)
ABS. EOSINOPHILS: 0.4 10*3/uL (ref 0.0–0.5)
ABS. LYMPHOCYTES: 0.8 10*3/uL (ref 0.8–3.5)
ABS. MONOCYTES: 0.6 10*3/uL — ABNORMAL LOW (ref 0.8–3.5)
ABS. NEUTROPHILS: 3.1 10*3/uL (ref 1.5–8.0)
BASOPHILS: 0 % (ref 0–2)
EOSINOPHILS: 7 % — ABNORMAL HIGH (ref 0–5)
HCT: 44.7 % (ref 41–53)
HGB: 15.6 g/dL (ref 13.5–17.5)
LYMPHOCYTES: 16 % — ABNORMAL LOW (ref 19–48)
MCH: 32.5 PG — ABNORMAL HIGH (ref 27–31)
MCHC: 34.9 g/dL (ref 31–37)
MCV: 93.1 FL (ref 80–100)
MONOCYTES: 12 % — ABNORMAL HIGH (ref 3–9)
MPV: 10.1 FL (ref 5.9–10.3)
NEUTROPHILS: 65 % (ref 40–74)
PLATELET: 139 10*3/uL (ref 130–400)
RBC: 4.8 M/uL (ref 4.7–6.1)
RDW: 14.1 % (ref 11.5–14.5)
WBC: 4.9 10*3/uL (ref 4.5–10.8)

## 2013-09-23 LAB — METABOLIC PANEL, COMPREHENSIVE
A-G Ratio: 1.4 (ref 1.2–2.2)
ALT (SGPT): 32 U/L (ref 12–78)
AST (SGOT): 23 U/L (ref 15–37)
Albumin: 4.3 g/dL (ref 3.4–5.0)
Alk. phosphatase: 80 U/L (ref 45–117)
Anion gap: 6 mmol/L (ref 6–15)
BUN/Creatinine ratio: 14 (ref 7–25)
BUN: 11 MG/DL (ref 7–18)
Bilirubin, total: 1.6 MG/DL — ABNORMAL HIGH (ref <1.1)
CO2: 30 mmol/L (ref 21–32)
Calcium: 10 MG/DL (ref 8.5–10.1)
Chloride: 104 mmol/L (ref 98–107)
Creatinine: 0.8 MG/DL (ref 0.60–1.30)
GFR est AA: >60 mL/min/{1.73_m2} (ref >60)
GFR est non-AA: >60 mL/min/{1.73_m2} (ref >60)
Globulin: 3.1 g/dL (ref 2.4–3.5)
Glucose: 99 mg/dL (ref 70–110)
Potassium: 4.9 mmol/L (ref 3.5–5.3)
Protein, total: 7.4 g/dL (ref 6.4–8.2)
Sodium: 140 mmol/L (ref 136–145)

## 2013-09-23 LAB — PSA SCREENING (SCREENING): Prostate Specific Ag: 2.2 ng/mL (ref 0.0–4.0)

## 2013-09-23 LAB — TSH 3RD GENERATION: TSH: 0.96 u[IU]/mL (ref 0.35–3.74)

## 2013-09-23 NOTE — Progress Notes (Signed)
Labs were collected @ 935 am per Dr. Miller,   1-purple tube  1-green tube  1-yellow tube

## 2013-09-23 NOTE — Progress Notes (Signed)
This is an Initial Medicare Annual Wellness Exam (AWV) (Performed 12 months after IPPE or effective date of Medicare Part B enrollment, Once in a lifetime)    I have reviewed the patient's medical history in detail and updated the computerized patient record.     History     The patient is a very pleasant 68 year old white male with a past medical history of hypertension, osteoarthritis, benign prostatic hyperplasia, multiple lower extremity DVTs, erectile dysfunction, who is chronically anticoagulated on Coumadin, who presents to the office today for his wellness physical. The patient has no systemic complaints at this time.  Blood pressure is 118/78 in the office today and the patient states that blood pressure is ranging from between 115-125 systolic, and upper 70s to 84 diastolic.  States that average is right around 120/80.  He denies any headaches, chest pain, shortness of breath, nausea, vomiting, diarrhea.  PT and INR has been therapeutic and stable without any variation for almost a year.  He is very compliant with his Coumadin.  He does home PT and INR checks weekly.       The patient does have a history of benign prostatic hyperplasia and admits to some night time urination, usually one episode at night, occasional hesitancy and occasional dribbling.  He does have a longstanding history of erectile dysfunction of which he uses Levitra for, which allows him to successfully achieve an erection.  The patient use to see a urologist regularly but has not seen one in several years.  Last PSA was a year ago.      Lab Results   Component Value Date/Time    Prostate Specific Ag 3.8 02/29/2012  8:55 AM       Past Medical History   Diagnosis Date   ??? Hypertension    ??? Arthritis    ??? BPH (benign prostatic hyperplasia)    ??? DVT (deep venous thrombosis) x 2 2009  and 2011      after knee surgery then after long car ride    ??? ED (erectile dysfunction)    ??? Thromboembolus       Past Surgical History   Procedure  Laterality Date   ??? 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 Hx endoscopy       colonoscopy last was in 2008    ??? Hx orthopaedic       Current Outpatient Prescriptions   Medication Sig Dispense Refill   ??? lisinopril-hydrochlorothiazide (PRINZIDE, ZESTORETIC) 20-12.5 mg per tablet Take 1 tablet by mouth daily.  90 tablet  1   ??? warfarin (COUMADIN) 5 mg tablet As directed  90 tablet  2   ??? warfarin (COUMADIN) 1 mg tablet As directed  180 tablet  2   ??? fexofenadine (ALLEGRA) 180 mg tablet Take  by mouth daily.       ??? vardenafil (LEVITRA) 10 mg tablet Take 10 mg by mouth as needed. One hour prior to desired affect  30 Tab  1   ??? multivitamin (ONE A DAY) tablet Take 1 Tab by mouth daily.         No Known Allergies  Family History   Problem Relation Age of Onset   ??? Arthritis-osteo Mother    ??? Hypertension Mother    ??? Arthritis-osteo Father    ??? Arthritis-osteo Brother    ??? Cancer Paternal Uncle      unknown   ??? Cancer Paternal Uncle  stomach   ??? Diabetes Paternal Uncle      History   Substance Use Topics   ??? Smoking status: Former Smoker -- 0.50 packs/day for 20 years     Quit date: 11/30/1984   ??? Smokeless tobacco: Not on file   ??? Alcohol Use: Yes      Comment: 3 glasses of wine "couple of drinks"      Patient Active Problem List   Diagnosis Code   ??? 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 Anticoagulated on Coumadin V58.83, V58.61   ??? Dyslipidemia 272.4         Depression Risk Factor Screening:     PHQ 2 / 9, over the last two weeks 09/23/2013   Little interest or pleasure in doing things Not at all   Feeling down, depressed or hopeless Not at all   Total Score PHQ 2 0     Alcohol Risk Factor Screening:   On any occasion during the past 3 months, have you had more than 4 drinks containing alcohol?  No    Do you average more than 14 drinks per week?  No    Functional Ability and Level of Safety:     Hearing Loss   None     Activities of Daily Living   Self-care.   Requires assistance with: no ADLs    Fall Risk      Fall Risk Assessment, last 12 mths 09/23/2013   Able to walk? Yes   Fall in past 12 months? No     Abuse Screen   Patient is not abused    Review of Systems   A comprehensive review of systems was negative except for that written in the HPI.    Physical Examination     Visit Vitals   Item Reading   ??? BP 118/78   ??? Pulse 87   ??? Temp(Src) 98.6 ??F (37 ??C) (Temporal)   ??? Resp 18   ??? Ht 5\' 10"  (1.778 m)   ??? Wt 217 lb (98.431 kg)   ??? BMI 31.14 kg/m2   ??? SpO2 98%       Evaluation of Cognitive Function:  Mood/affect:  happy  Appearance: age appropriate        General: NAD. A x O x 3     Skin: no evidence of rashes, scars, or moles    HEENT: Head: atraumatic normocephalic, Eyes:  PERLA, EOMI Ears: without discharge or pain of external exam Nares: patent Throat: with out erythema     Neck: no thyromegaly    Heart:  RRR, S1S2, no murmur, gallop, or rub    Lungs:   CTA bilaterally     Abdomen:  Soft, nontender, BS x 4    Extremities:  Strength 4/4, no cyanosis, no edema      Patient Care Team:  Donnella Bi, DO as PCP - General (Family Practice)    Advice/Referrals/Counseling   Education and counseling provided:  Are appropriate based on today's review and evaluation    Assessment/Plan       ICD-9-CM    1. Routine general medical examination at a health care facility V70.0 CBC WITH AUTOMATED DIFF     METABOLIC PANEL, COMPREHENSIVE     TSH, 3RD GENERATION   2. Lower urinary tract symptoms (LUTS) 788.99 REFERRAL TO UROLOGY   3. Screening for ischemic heart disease V81.0 LIPID PANEL   4. Screening for depression V79.0    5. Screening for alcoholism V79.1  6. Special screening for malignant neoplasm of prostate V76.44 PSA SCREENING (SCREENING)   7. Anticoagulated on Coumadin V58.83     V58.61    8. HTN (hypertension) 401.9        The patient up to date on preventative health care.  Current chronic medical issues are stable at this time.  Discussed nutrition, diet, exercise in detail today.         Follow-up Disposition:   Return in about 6 months (around 03/26/2014).  lab results and schedule of future lab studies reviewed with patient  reviewed diet, exercise and weight control  reviewed medications and side effects in detail.    I have discussed the above plan with the patient. Patient has also been given instructions and information on his conditions.  Patient is aware of all possible side effects and agrees with the above mentioned plan.       Donnella BiLauren E Demere Dotzler, DO

## 2013-09-23 NOTE — Patient Instructions (Signed)
[facility]   Office working hours are Monday - Friday 8:00 am to 4:30 pm.   Saturday 9 am to 12 pm for acute illnesses only for established patients.   Office phone number is [phone] and fax is [phone].   For non-emergent medical care and clinical advice during office hours:   1. Call office or   2. Send message or request using MyChart   For non-emergent medical care and clinical advice after office hours:   1. Call [phone] or   2. Send message or request using MyChart   Emergency care can be obtained at the [facility] ER, Urgent Care or calling 911.   Patient Satisfaction Survey   We appreciate you giving us your e-mail address. Please watch for our patient satisfaction survey which you will receive by e-mail. We strive to provide you with the best care possible. We respect all comments and will take comments into consideration to improve our service.   Thank you for your participation.

## 2013-09-24 NOTE — Progress Notes (Signed)
Quick Note:    Done.  ______

## 2013-09-24 NOTE — Progress Notes (Signed)
Quick Note:    Overall labs work looks good; there are a few labs that are outside the standard normal range, clinical significance doubtful    ______

## 2013-10-30 LAB — AMB POC CREATININE ASSAY, OTHER SOURCE
Creatinine, other source: 100
Source: NORMAL

## 2013-10-30 LAB — AMB POC URINALYSIS DIP STICK AUTO W/O MICRO
Blood (UA POC): NEGATIVE
Glucose (UA POC): NEGATIVE
Ketones (UA POC): NEGATIVE
Leukocyte esterase (UA POC): NEGATIVE
Nitrites (UA POC): NEGATIVE
Protein (UA POC): NEGATIVE mg/dL
Specific gravity (UA POC): 1.02 (ref 1.001–1.035)
pH (UA POC): 6 (ref 4.6–8.0)

## 2013-10-30 MED ORDER — TAMSULOSIN SR 0.4 MG 24 HR CAP
0.4 mg | ORAL_CAPSULE | Freq: Every day | ORAL | Status: AC
Start: 2013-10-30 — End: 2014-01-28

## 2013-10-30 NOTE — Patient Instructions (Addendum)
[facility] working hours are 8:00am-4:30pm    Office phone number is [phone] and the fax line is [phone]  For non-emergent medical care and clinical advice during office hours  1. Call the office or  2. Send a message to office staff via [facility]  Emergency care can be obtained at the [facility] ER, Urgent Care or 911.    Patient Satisfaction Survey   We appreciate you sharing your e-mail address with [place]. Please watch for our patient  satisfaction survey which you will receive by e-mail. We strive to provide you with the   best care possible. We respect all comments and will take them into consideration to improve our service.  Thank you for your participation.      Tamsulosin (By mouth)   Tamsulosin Hydrochloride (tam-SOO-loe-sin hye-droe-KLOR-ide)  Treats benign prostatic hyperplasia (enlarged prostate).   Brand Name(s):Flomax   There may be other brand names for this medicine.  When This Medicine Should Not Be Used:   This medicine is not right for everyone. Do not use it if you had an allergic reaction to tamsulosin.  How to Use This Medicine:   Capsule  ?? Take your medicine as directed. Your dose may need to be changed several times to find what works best for you.  ?? Take this medicine 30 minutes after the same meal each day. Swallow the capsule whole. Do not crush, chew, or open it.  ?? Read and follow the patient instructions that come with this medicine. Talk to your doctor or pharmacist if you have any questions.  ?? Missed dose: Take a dose as soon as you remember. If it is almost time for your next dose, wait until then and take a regular dose. Do not take extra medicine to make up for a missed dose. If you forget to take this medicine for several days in a row, talk to your doctor before you start taking it again.  ?? Store the medicine in a closed container at room temperature, away from heat, moisture, and direct light.  Drugs and Foods to Avoid:   Ask your doctor or  pharmacist before using any other medicine, including over-the-counter medicines, vitamins, and herbal products.  ?? Some medicines can affect how tamsulosin works. Tell your doctor if you are using another alpha blocker medicine, cimetidine, erythromycin, ketoconazole, paroxetine, terbinafine, warfarin, or medicine for erectile dysfunction.  Warnings While Using This Medicine:   ?? Tell your doctor if you have kidney disease, liver disease, low blood pressure, prostate cancer, or an allergy to sulfa drugs.  ?? Tell your doctor if you plan to have cataract or glaucoma surgery. This medicine may cause eye problems during surgery.  ?? This medicine may make you dizzy or lightheaded. Do not drive or do anything else that could be dangerous until you know how this medicine affects you. Stand or sit up slowly if you feel dizzy.  ?? Your doctor will check your progress and the effects of this medicine at regular visits. Keep all appointments.  ?? Keep all medicine out of the reach of children. Never share your medicine with anyone.  Possible Side Effects While Using This Medicine:   Call your doctor right away if you notice any of these side effects:  ?? Allergic reaction: Itching or hives, swelling in your face or hands, swelling or tingling in your mouth or throat, chest tightness, trouble breathing  ?? Blistering, peeling, red skin rash  ?? Lightheadedness,  dizziness, fainting  ?? Painful, prolonged erection of your penis  If you notice these less serious side effects, talk with your doctor:   ?? Headache  ?? Problems with ejaculation  ?? Runny or stuffy nose  If you notice other side effects that you think are caused by this medicine, tell your doctor.   Call your doctor for medical advice about side effects. You may report side effects to FDA at 1-800-FDA-1088  ?? 2014 [facility] [employer] Information is for End User's use only and may not be sold, redistributed or otherwise used for commercial purposes.  The above  information is an educational aid only. It is not intended as medical advice for individual conditions or treatments. Talk to your doctor, nurse or pharmacist before following any medical regimen to see if it is safe and effective for you.    Erectile Dysfunction: After Your Visit  Your Care Instructions  A man has erectile dysfunction (ED) when he routinely can't get or keep an erection that allows satisfactory sex. He may not be able to have an erection at any time. Or he may not be able to have one that is firm enough or lasts long enough to complete intercourse.  ED is not the same as having trouble getting an erection now and then. That's common. It happens to most men at some time.  ED can be caused by problems with the blood vessels, nerves, or hormones. It can be caused by diabetes, heart disease, and injuries. Nerve disorders, such as multiple sclerosis or Parkinson's disease, can also cause it.  ED can also be caused by medicines, alcohol, and tobacco. Or it may be caused by depression, stress, grief, or relationship problems.  Follow-up care is a key part of your treatment and safety. Be sure to make and go to all appointments, and call your doctor if you are having problems. It's also a good idea to know your test results and keep a list of the medicines you take.  How can you care for yourself at home?  Lifestyle  ?? Limit alcohol. Have no more than 2 drinks a day.  ?? Do not smoke. Smoking makes it harder for the blood vessels in the penis to relax and let blood flow in. If you need help quitting, talk to your doctor about stop-smoking programs and medicines. These can increase your chances of quitting for good.  ?? Do not use cocaine, heroin, or other illegal drugs.  ?? Try to reduce stress.  ?? Give yourself time to adjust to change. Changes in your job, family, relationships, home life, and other areas can cause stress. And stress can cause erection problems.  Work with your partner  ?? Don't assume that  you know what your partner likes when it comes to sex. You may be wrong. Talk about what each of you does and does not enjoy.  ?? Make time outside of the bedroom to talk about your sex life. If you avoid sex because you are afraid of having erection problems, your partner may worry that you are no longer interested.  ?? If you and your partner have trouble talking about sex, see a therapist who can help you talk about it. Reading books with your partner about sexual health may also help.  ?? Relax. Take time for more foreplay. Worrying about your erections may only make things worse.  Medicines  ?? Tell your doctor about all the medicines that you take.  ?? Some medicines  can cause erection problems.  ?? Some medicines can have dangerous interactions with medicines that are prescribed for ED, including over-the-counter medicines and herbal products.  ?? Take your medicines exactly as prescribed. Call your doctor if you think you are having a problem with your medicine.  ?? Talk to your doctor about trying Viagra, Levitra, or Cialis. If you have a heart problem, ask your doctor if these are safe for you.  When should you call for help?  Call your doctor now or seek immediate medical care if:  ?? You have an erection that lasts longer than 3 hours.  ?? You have taken Viagra, Levitra, or Cialis in the past 24 hours, and you have chest pain. Do not take nitroglycerin.  ?? You have erection problems along with pain or difficulty with urination, fever, or pain in the lower belly.  Watch closely for changes in your health, and be sure to contact your doctor if you have any problems.   Where can you learn more?   Go to [email or web address]  Enter J531 in the search box to learn more about "Erectile Dysfunction: After Your Visit."   ?? 2006-2015 Healthwise[employer] Inc[employer]. Care instructions adapted under license by [employer] (which disclaims liability or warranty for this information). This care instruction is for use  with your licensed healthcare professional. If you have questions about a medical condition or this instruction, always ask your healthcare professional. Healthwise, Incorporated disclaims any warranty or liability for your use of this information.  Content Version: 10.4.390249; Current as of: May 10, 2013              Benign Prostatic Hyperplasia: After Your Visit  Your Care Instructions     Benign prostatic hyperplasia, or BPH, is an enlarged prostate gland. The prostate is a small gland that makes some of the fluid in semen. Prostate enlargement happens to almost all men as they age. It is usually not serious. BPH does not cause prostate cancer.  As the prostate gets bigger, it may partly block the flow of urine. You may have a hard time getting a urine stream started or completely stopped. BPH can cause dribbling. You may have a weak urine stream, or you may have to urinate more often than you used to, especially at night. Most men find these problems easy to manage.  You do not need treatment unless your symptoms bother you a lot or you have other problems, such as bladder infections or stones. In these cases, medicines may help. Surgery is not needed unless the urine flow is blocked or the symptoms do not get better with medicine.  Follow-up care is a key part of your treatment and safety. Be sure to make and go to all appointments, and call your doctor if you are having problems. It's also a good idea to know your test results and keep a list of the medicines you take.  How can you care for yourself at home?  ?? Take plenty of time to urinate. Try to relax.  ?? Try "double voiding." Urinate as much you can, relax for a few moments, and then try to urinate again.  ?? Sit on the toilet to urinate.  ?? Read or think of other things while you are waiting.  ?? Turn on a faucet, or try to picture running water. Some men find that this helps get their urine flowing.  ?? If dribbling is a problem, wash your penis  daily to avoid skin irritation and  infection.  ?? Avoid caffeine and alcohol. These drinks will increase how often you need to urinate. Spread your fluid intake throughout the day. If the urge to urinate often wakes you at night, limit your fluid intake in the evening. Urinate right before you go to bed.  ?? Many over-the-counter cold and allergy medicines can make the symptoms of BPH worse. Avoid antihistamines, decongestants, and allergy pills, if you can. Read the warnings on the package.  ?? If you take any prescription medicines, especially tranquilizers or antidepressants, ask your doctor or pharmacist whether they can cause urination problems. There may be other medicines you can use that do not cause urinary problems.  ?? Be safe with medicines. Take your medicines exactly as prescribed. Call your doctor if you think you are having a problem with your medicine.  When should you call for help?  Call your doctor now or seek immediate medical care if:  ?? You cannot urinate at all.  ?? You have symptoms of a urinary infection. For example:  ?? You have blood or pus in your urine.  ?? You have pain in your back just below your rib cage. This is called flank pain.  ?? You have a fever, chills, or body aches.  ?? It hurts to urinate.  ?? You have groin or belly pain.  Watch closely for changes in your health, and be sure to contact your doctor if:  ?? It hurts when you ejaculate.  ?? Your urinary problems get a lot worse or bother you a lot.   Where can you learn more?   Go to [facility]  Enter [phone] in the search box to learn more about "Benign Prostatic Hyperplasia: After Your Visit."   ?? 2006-2015 Healthwise, Incorporated. Care instructions adapted under license by [employer] (which disclaims liability or warranty for this information). This care instruction is for use with your licensed healthcare professional. If you have questions about a medical condition or this instruction, always ask your  healthcare professional. Healthwise, Incorporated disclaims any warranty or liability for your use of this information.  Content Version: 10.4.390249; Current as of: November 28, 2012

## 2013-10-30 NOTE — Progress Notes (Signed)
Male New Patient Note      Patient: Julian BottcherGregory Earl Douglas               Sex: male             MRN: [id number]      Date of Birth:  05/25/1946      Age:  68 y.o.               Subjective:     Julian Bailey is a 68 y.o. male who is being seen in consultation from Mayford KnifeLauren Miller for BPH.  His last PSA was 2.2 this year.  The patient has never seen a urologist in the past.  The patient's biggest complaint is nocturia, about 4 times nightly.  He has been stopping his fluids 3-4 hours before bedtime.  This hasn't made a difference in his urinary symptoms.  He has been having this problem at least 2 years now.  He has a weak stream, hesitancy, intermittency, and feelings of incomplete bladder emptying.  No family history of prostate cancer.  His last DRE was at least 4-5 years ago.  He was told he had BPH.  He has never been medicated for this.    No dysuria or gross hematuria.  He has frequency, voiding every 2 hours.  He also has urgency at times.  His father had a "pre-cancerous" growth removed from his bladder recently.  No family history of kidney cancer.  The patient is a former smoker, but quit in the 1980's.  The patient normally drinks 3 bottles of water and very seldom any pop or tea.  He does have 2 cups of coffee in the morning.    The patient has had ED for about 5 years.  He has been taking Levitra for that amount of time.  He has a current Rx and doesn't need refills.  He is pleased with the results.    No constipation.  No hematochezia or melena.  [facility].  He denies any polyps at that colonoscopy and was told to have a 10 year follow up.  45 minutes were spent in face-to-face time with the patient at his visit today, >50% of which were spent in counseling on treatment options for BPH, ED, and answering all questions regarding his care.       Past Medical History   Diagnosis Date   ??? Hypertension    ??? Arthritis    ??? BPH (benign prostatic hyperplasia)    ??? DVT (deep  venous thrombosis) (HCC) x 2 2009  and 2011      after knee surgery then after long car ride    ??? ED (erectile dysfunction)    ??? Thromboembolus (HCC)    ??? Cervical disc disease        Past Surgical History   Procedure Laterality Date   ??? 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 Hx endoscopy       colonoscopy last was in 2008    ??? Hx orthopaedic         Family History   Problem Relation Age of Onset   ??? Arthritis-osteo Mother    ??? Hypertension Mother    ??? Arthritis-osteo Father    ??? Arthritis-osteo Brother    ??? Cancer Paternal Uncle      unknown   ??? Cancer Paternal Uncle      stomach   ???  Diabetes Paternal Uncle        History     Social History   ??? Marital Status: MARRIED     Spouse Name: N/A     Number of Children: N/A   ??? Years of Education: N/A     Social History Main Topics   ??? Smoking status: Former Smoker -- 0.50 packs/day for 20 years     Quit date: 11/30/1984   ??? Smokeless tobacco: Not on file   ??? Alcohol Use: Yes      Comment: 3 glasses of wine "couple of drinks"    ??? Drug Use: No   ??? Sexual Activity:     Partners: Female     Other Topics Concern   ??? Blood Transfusions No     Social History Narrative    Medicare wellness 09/23/13        Prior to Admission medications    Medication Sig Start Date End Date Taking? Authorizing Provider   lisinopril-hydrochlorothiazide (PRINZIDE, ZESTORETIC) 20-12.5 mg per tablet Take 1 tablet by mouth daily. 05/13/13  Yes Donnella Bi, DO   warfarin (COUMADIN) 5 mg tablet As directed 05/13/13  Yes Donnella Bi, DO   warfarin (COUMADIN) 1 mg tablet As directed 05/13/13  Yes Lauren Samara Deist, DO   fexofenadine (ALLEGRA) 180 mg tablet Take  by mouth daily.   Yes Historical Provider   vardenafil (LEVITRA) 10 mg tablet Take 10 mg by mouth as needed. One hour prior to desired affect 12/20/11  Yes Donnella Bi, DO   multivitamin (ONE A DAY) tablet Take 1 Tab by mouth daily.   Yes Historical Provider       No Known Allergies    Review of Systems:  General:      Intentional  weight loss  Head:      negative  Eyes:      wears glasses or bifocals  Respiratory:     Patient denies cough, chest pain, dyspnea, wheezing or hemoptysis  Cardiac:      negative  Gatrointestinal:  negative  Urinary:        change in urinary stream, nocturia and urinary frequency/urgency  Musculoskeletal: joint pain; sciatica  Endocrine:       Denies: polydipsia/polyuria  Hematological:    there is no easy bleeding or bruising  Psychological:    negative  Skin:                     "dermatitis"      Objective:      Visit Vitals   Item Reading   ??? BP 152/98   ??? Pulse 99   ??? Temp 98.4 ??F (36.9 ??C)   ??? Resp 18   ??? Ht 5\' 10"  (1.778 m)   ??? Wt 210 lb (95.255 kg)   ??? BMI 30.13 kg/m2         Lab Results   Component Value Date/Time    Prostate Specific Ag 2.2 09/23/2013  9:35 AM    Prostate Specific Ag 3.8 02/29/2012  8:55 AM       Labs:  Urinalysis:    Results for orders placed in visit on 10/30/13   AMB POC URINALYSIS DIP STICK AUTO W/O MICRO     Status: None       Result Value Ref Range Status    Color (UA POC) Yellow   Final    Clarity (UA POC) Clear   Final    Glucose (UA POC)  Negative  Negative Final    Bilirubin (UA POC)    Negative     Ketones (UA POC) Negative  Negative Final    Specific gravity (UA POC) 1.020  1.001 - 1.035 Final    Blood (UA POC) Negative  Negative Final    pH (UA POC) 6.0  4.6 - 8.0 Final    Protein (UA POC) Negative  Negative mg/dL Final    Urobilinogen (UA POC)    0.2 - 1     Nitrites (UA POC) Negative  Negative Final    Leukocyte esterase (UA POC) Negative  Negative Final         Lab Results   Component Value Date/Time    Prostate Specific Ag 2.2 09/23/2013  9:35 AM    Prostate Specific Ag 3.8 02/29/2012  8:55 AM        Imaging:   None reviewed.    PVR:  165 mL    SHIM Score: 21 (mild ED) with levitra    AUA/QOL score:  23/5      Physical Exam:   Constitutional: Normal appearing patient with well groomed; No deformities; Body habitus appears normal; Nutritionally sound.   Neck: Supple; No JVD; No  cervical lymphadenopathy; No carotid bruits; No masses; trachea midline; No thyromegaly  Respiratory: No use of accessory muscles; No intercostal retractions; Clear to auscultation bilaterally  Heart: Regular rate and rhythm; No murmurs, rubs, gallops  GI: soft, non tender, normal bowel sounds, no palpable masses, no organomegaly      GU:   Anus and perineum without any focal skin defects     Penis is circumcised without plaques, masses, scaring, or deformity    Urethral meatus is normal in size and location; No urethral lesions, discharge, or masses    Testis are descended bilaterally within the scrotum; Testis are symmetric and normal size; No masses.    Epididymides are noted bilaterally; They are normal in size and symmetry; No cyst or masses     Scrotum is without any lesions, cysts, or rashes    No inguinal lymphadenopathy or inguinal hernias    Digital Rectal Exam:   Prostate is 2+ (50gms), small soft nodule in the left midline; Rectal sphincter tone is good: No hemorrhoids present; No rectal masses    Seminal Vesicles are normal in size, symmetry without tenderness, mass, or enlargement    Musculoskeletal:   No CVA tenderness; Good sensation, ROM, and strength bilaterally of the Upper and Lower extremities.  Venous stasis and varicosities of the lower extremities.    Assessment/Plan     1. Weak stream--start tamsulosin 0.4 mg daily  2. Hesitancy--start tamsulosin 0.4 mg daily  3. Intermittency--start tamsulosin 0.4 mg daily  4. Nocturia - Decrease fluids 3 hours prior to bedtime.  5. ED - Continue Levitra 10 mg and PRN.  No refills needed.  6. Frequency - Patient was advised to increase water intake to at least 64 oz daily and decrease coffee, colas, teas, etc.  7. Urgency - Patient was advised to increase water intake to at least 64 oz daily and decrease coffee, colas, teas, etc.   8. Incomplete bladder emptying - add tamsulosin 0.4 mg daily.  Possible side effects of this medication were discussed with the  patient to include orthostasis and retrograde ejaculation.  The patient was advised to take this medication at least 2 hours apart from the PDE5 inhibitors.  Rx was sent to his pharmacy.  9. BPH with LUTS--will start tamsulosin 0.4  mg daily.    10. I will see back in 6 months.

## 2014-01-14 NOTE — Progress Notes (Addendum)
Anticoagulation  Patient here for followup of chronic anticoagulation.  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

## 2014-01-15 LAB — AMB POC PT/INR: INR POC: 3.2

## 2014-01-16 MED ORDER — LISINOPRIL 20 MG TAB
20 mg | ORAL_TABLET | Freq: Every day | ORAL | Status: DC
Start: 2014-01-16 — End: 2014-06-06

## 2014-01-16 NOTE — Progress Notes (Deleted)
Anticoagulation  Patient here for followup of chronic anticoagulation.  Indication: {indication for anticoagulation:17259}  Bleeding Signs/Symptoms:  {none/mild/yes:12801}  Thromboembolic Signs/Symptoms:  {none/mild/yes:12801}  Missed Coumadin Doses:  {none/this week/over week:12802}  Medication Changes:  {yes***/no:17258}  Dietary Changes:  {yes***/no:17258}  Bacterial/Viral Infection:  {yes***/no:17258}  Coumadin Flowsheet Values Recorded Today:

## 2014-01-16 NOTE — Progress Notes (Signed)
Anticoagulation  Patient here for followup of chronic anticoagulation.  Indication: DVT  Bleeding Signs/Symptoms:  None  Thromboembolic Signs/Symptoms:  None  Missed Coumadin Doses:  This week - missed 6 days worth of doses. Started back on 5 mg last night.   Medication Changes:  no  Dietary Changes:  yes - has been eating a lot of broccoli and reducing salt intake  Bacterial/Viral Infection:  no  Coumadin Flowsheet Values Recorded Today:  1.0 (on office meter) 0.8 (on home meter)        PT was supra therapeutic last visit and got confused with dosing and completely held coumadin ;   Refuses Lovenox   Start 7 tonight then alternate 5     Thursday 7  Friday 5  Saturday 7   Sunday 7  Monday 5  Tuesday 7  Wednesday 5    Discussed any signs of DVT, stroke, etc    BP in office today 140/88-- has not taken BP medication this morning     I have discussed the above plan with the patient. Patient has also been given instructions and information on his conditions.  Patient is aware of all possible side effects and agrees with the above mentioned plan.       Donnella BiLauren E Miller, DO

## 2014-01-16 NOTE — Progress Notes (Addendum)
HISTORY OF PRESENT ILLNESS  Julian Bailey is a 68 y.o. male.    has a past medical history of Hypertension; Arthritis; BPH (benign prostatic hyperplasia); DVT (deep venous thrombosis) (HCC) (x 2 2009  and 2011 ); ED (erectile dysfunction); Thromboembolus (HCC); and Cervical disc disease.    Chief Complaint   Patient presents with   ??? Coagulation disorder         HPI  The patient is a very pleasant 68 year old white male with the above listed past medical history who presents to the office today to follow up care.  The patient presents to follow up for longstanding hypertension.  The patient has been taking BP at home and states that is had been running low (100/60) so decreased BP pill himself to 1/2 tab daily. In the office it is 140/88.  He denies any symptoms referable to hypertension.  This would include but is not limited to headache, chest pain, shortness of breath, nausea, vomiting and neurological deficits.  The patient states that he has not had any side effects on the medication.  He denies any cough associated with the Lisinopril and overall is doing well.      The patient presents to follow up for chronic anticoagulation on Coumadin.         Anticoagulation  Patient here for followup of chronic anticoagulation.  Indication: DVT  Bleeding Signs/Symptoms:  None  Thromboembolic Signs/Symptoms:  None  Missed Coumadin Doses:  This week - missed 6 days worth of doses. Started back on 5 mg last night.   Medication Changes:  no  Dietary Changes:  yes - has been eating a lot of broccoli and reducing salt intake  Bacterial/Viral Infection:  no  Coumadin Flowsheet Values Recorded Today:  1.0 (on office meter) 0.8 (on home meter)        PT was supra therapeutic last visit and got confused with dosing and completely held coumadin ;   Refuses Lovenox   Start 7 tonight then alternate 5     ROS:  Constitutional: Negative for fever, chills, weight loss, malaise/fatigue and diaphoresis.    HENT: Negative for congestion and sore throat.    Eyes: Negative for blurred vision and photophobia.   Respiratory: Negative for cough and shortness of breath.    Cardiovascular: Negative for chest pain, palpitations and leg swelling.   Gastrointestinal: Negative for heartburn, nausea, vomiting, abdominal pain, diarrhea, constipation, blood in stool and melena.   Genitourinary: Negative for dysuria.   Musculoskeletal: Negative for myalgias.   Skin: Negative for rash.   Neurological: Negative for dizziness, loss of consciousness, weakness and headaches.   Psychiatric/Behavioral: Negative.      Past Medical History   Diagnosis Date   ??? Hypertension    ??? Arthritis    ??? BPH (benign prostatic hyperplasia)    ??? DVT (deep venous thrombosis) (HCC) x 2 2009  and 2011      after knee surgery then after long car ride    ??? ED (erectile dysfunction)    ??? Thromboembolus (HCC)    ??? Cervical disc disease      Past Surgical History   Procedure Laterality Date   ??? 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 Hx endoscopy       colonoscopy last was in 2008    ??? Hx orthopaedic       Family History   Problem Relation Age of Onset   ??? Arthritis-osteo Mother    ???  Hypertension Mother    ??? Arthritis-osteo Father    ??? Arthritis-osteo Brother    ??? Cancer Paternal Uncle      unknown   ??? Cancer Paternal Uncle      stomach   ??? Diabetes Paternal Uncle      History     Social History   ??? Marital Status: MARRIED     Spouse Name: N/A     Number of Children: N/A   ??? Years of Education: N/A     Occupational History   ??? Not on file.     Social History Main Topics   ??? Smoking status: Former Smoker -- 0.50 packs/day for 20 years     Types: Cigarettes     Quit date: 11/30/1984   ??? Smokeless tobacco: Never Used   ??? Alcohol Use: Yes      Comment: 3 glasses of wine "couple of drinks"    ??? Drug Use: No   ??? Sexual Activity:     Partners: Female     Other Topics Concern   ??? Blood Transfusions No     Social History Narrative    Medicare wellness 09/23/13       No Known Allergies  Current Outpatient Prescriptions on File Prior to Visit   Medication Sig Dispense Refill   ??? tamsulosin (FLOMAX) 0.4 mg capsule Take 1 Cap by mouth daily for 90 days. 90 Cap 3   ??? warfarin (COUMADIN) 5 mg tablet As directed 90 tablet 2   ??? warfarin (COUMADIN) 1 mg tablet As directed 180 tablet 2   ??? fexofenadine (ALLEGRA) 180 mg tablet Take  by mouth daily.     ??? vardenafil (LEVITRA) 10 mg tablet Take 10 mg by mouth as needed. One hour prior to desired affect 30 Tab 1   ??? multivitamin (ONE A DAY) tablet Take 1 Tab by mouth daily.       No current facility-administered medications on file prior to visit.       Filed Vitals:    01/16/14 1045   BP: 140/88   Pulse: 89   Resp: 18   Height: 5\' 10"  (1.778 m)   Weight: 210 lb (95.255 kg)   .  Body mass index is 30.13 kg/(m^2).            Physical Exam   Nursing note and vitals reviewed.  Constitutional: He is oriented to person, place, and time. He appears well-developed and well-nourished. No distress.   HENT:   Head: Normocephalic and atraumatic.   Right Ear: External ear normal.   Left Ear: External ear normal.   Nose: Nose normal.   Mouth/Throat: Oropharynx is clear and moist. No oropharyngeal exudate.   Eyes: Conjunctivae are normal. Pupils are equal, round, and reactive to light. No scleral icterus.   Neck: Normal range of motion. Neck supple. No JVD present. No tracheal deviation present.   Cardiovascular: Normal rate, regular rhythm, normal heart sounds and intact distal pulses.  Exam reveals no gallop and no friction rub.    No murmur heard.  Pulmonary/Chest: Effort normal and breath sounds normal. No respiratory distress.   Abdominal: Soft. Bowel sounds are normal. He exhibits no distension and no mass. There is no tenderness.   Musculoskeletal: Normal range of motion. He exhibits no edema.   Lymphadenopathy:     He has no cervical adenopathy.   Neurological: He is alert and oriented to person, place, and time.   Skin: Skin is warm and dry.  Psychiatric: He has a normal mood and affect. His behavior is normal.       ASSESSMENT and PLAN    ICD-9-CM    1. Subtherapeutic anticoagulation V58.83 PROTHROMBIN TIME + INR    V58.61    2. History of DVT (deep vein thrombosis) V12.51 PROTHROMBIN TIME + INR   3. Anticoagulated on Coumadin V58.83 PROTHROMBIN TIME + INR    V58.61    4. Essential hypertension 401.9 lisinopril (PRINIVIL, ZESTRIL) 20 mg tablet     Follow-up Disposition:  Return in about 1 week (around 01/23/2014).  lab results and schedule of future lab studies reviewed with patient  reviewed diet, exercise and weight control  cardiovascular risk and specific lipid/LDL goals reviewed  reviewed medications and side effects in detail      I have discussed the above plan with the patient. Patient has also been given instructions and information on his conditions.  Patient is aware of all possible side effects and agrees with the above mentioned plan.   Discussed with patient that medical noncompliance can have serious consequences to patients health including hospitalization or even death.       Donnella BiLauren E Summer Mccolgan, DO

## 2014-01-23 LAB — AMB POC PT/INR: Prothrombin time (POC): 1.4 s

## 2014-01-23 MED ORDER — DESONIDE 0.05 % OINTMENT
0.05 % | Freq: Two times a day (BID) | CUTANEOUS | Status: DC
Start: 2014-01-23 — End: 2016-03-16

## 2014-01-23 NOTE — Progress Notes (Signed)
Anticoagulation  Patient here for followup of chronic anticoagulation.  Indication:   Bleeding Signs/Symptoms:  None  Thromboembolic Signs/Symptoms:  None  Missed Coumadin Doses:  None  Medication Changes:  no  Dietary Changes:  no  Bacterial/Viral Infection:  no  Coumadin Flowsheet Values Recorded Today:  1.4    Lab Results   Component Value Date/Time    INR 1.4 10/18/2011 03:55 PM    INR POC 3.2 01/15/2014 08:55 AM    INR POC 1.5 02/11/2013 11:44 AM    INR POC 1.5 01/13/2012 09:02 AM    PROTHROMBIN TIME 16.8 10/18/2011 03:55 PM     BP Readings from Last 3 Encounters:   01/23/14 122/80   01/16/14 140/88   10/30/13 152/98       Pt is taking 7 mg 4 days a week and 5 mg 3 days a week   The patient refuses Lovenox injections.      Increase to 7 mg 4 days a week and 6 mg 3 days a week     Needs refill of his Desonide       ICD-9-CM    1. Anticoagulated on Coumadin V58.83 AMB POC PT/INR    V58.61    Discussed possible increased risk for embolic event.  Discussed signs and symptoms of embolic event such as PE or DVT.  Discussed with the patient if he experiences any of these signs and symptoms to go to the ER immediately.  Also discussed if the patient has any free bleeding to go to the ER immediately as well.  The patient verbally voiced understanding.          Donnella BiLauren E Jahari Billy, DO

## 2014-01-23 NOTE — Progress Notes (Deleted)
Anticoagulation  Patient here for followup of chronic anticoagulation.  Indication:   Bleeding Signs/Symptoms:  None  Thromboembolic Signs/Symptoms:  None  Missed Coumadin Doses:  None  Medication Changes:  no  Dietary Changes:  no  Bacterial/Viral Infection:  no  Coumadin Flowsheet Values Recorded Today:  1.4

## 2014-02-06 NOTE — Progress Notes (Signed)
BP Readings from Last 3 Encounters:   02/06/14 128/82   01/23/14 122/80   01/16/14 140/88     Visit Vitals   Item Reading   ??? BP 128/82 mmHg   ??? Pulse 76   ??? Temp(Src) 98.6 ??F (37 ??C) (Temporal)   ??? Resp 18   ??? Ht 5\' 10"  (1.778 m)   ??? Wt 215 lb (97.523 kg)   ??? BMI 30.85 kg/m2   ??? SpO2 99%       Anticoagulation  Patient here for followup of chronic anticoagulation.  Indication: DVT  Bleeding Signs/Symptoms:  Yes -when brushes teeth, gum bleeding, when blew nose this AM noticed some blood.  Thromboembolic Signs/Symptoms:  None  Missed Coumadin Doses:  None  Medication Changes:  no  Dietary Changes:  no  Bacterial/Viral Infection:  no  Coumadin Flowsheet Values Recorded Today:3.9  7 mg, Sat. Sund, Tues. Thurs  5 mg Monday, Wed. Friday    PE: no open lesion   Guns not bleeding   No Conjunctiva pallor   Hemosiderin pigmentation on extremities       ICD-9-CM ICD-10-CM    1. Anticoagulated on Coumadin V58.83 Z51.81     V58.61 Z79.01    2. Essential hypertension 401.9 I10        Change to   Hold today and Friday   The restart 5 mg po daily  Saturday and recheck on Monday   If bleeding worsens go to ER     Continue to follow BP     I have discussed the above plan with the patient. Patient has also been given instructions and information on his conditions.  Patient is aware of all possible side effects and agrees with the above mentioned plan.       Donnella Bi, DO

## 2014-02-06 NOTE — Progress Notes (Deleted)
Anticoagulation  Patient here for followup of chronic anticoagulation.  Indication:   Bleeding Signs/Symptoms:yes (when brushing teeth every couple of days and when he blew his nose this am)  Thromboembolic Signs/Symptoms:  None  Missed Coumadin Doses:  None  Medication Changes:  no  Dietary Changes:  no  Bacterial/Viral Infection:  no  Coumadin Flowsheet Values Recorded Today:  3.9  Current coumadin dose 7mg  on Sat, Sun, Tues, Thur                                           5mg  on Mon, Wed, Fri

## 2014-02-10 LAB — AMB POC PT/INR: INR POC: 1.2

## 2014-02-10 LAB — PROTHROMBIN TIME + INR
INR: 1.1 (ref 0.9–1.1)
Prothrombin time: 14.8 s (ref 12.5–15.0)

## 2014-02-10 NOTE — Progress Notes (Signed)
Labs were collected @ 845 am per Dr. [name],   1-blue tube

## 2014-02-10 NOTE — Progress Notes (Signed)
Quick Note:        Let pt now Meter is working; it was just a Water quality scientistglitch    Julian Gladman E Faust Thorington, DO        ______

## 2014-02-10 NOTE — Patient Instructions (Signed)
[facility]   Office working hours are Monday - Friday 8:00 am to 4:30 pm.   Saturday 9 am to 12 pm for acute illnesses only for established patients.   Office phone number is [phone] and fax is [phone].   For non-emergent medical care and clinical advice during office hours:   1. Call office or   2. Send message or request using MyChart   For non-emergent medical care and clinical advice after office hours:   1. Call [phone] or   2. Send message or request using MyChart   Emergency care can be obtained at the [facility] ER, Urgent Care or calling 911.   Patient Satisfaction Survey   We appreciate you giving us your e-mail address. Please watch for our patient satisfaction survey which you will receive by e-mail. We strive to provide you with the best care possible. We respect all comments and will take comments into consideration to improve our service.   Thank you for your participation.

## 2014-02-10 NOTE — Progress Notes (Signed)
has a past medical history of Hypertension; Arthritis; BPH (benign prostatic hyperplasia); DVT (deep venous thrombosis) (HCC) (x 2 2009  and 2011 ); ED (erectile dysfunction); Thromboembolus (HCC); and Cervical disc disease.    Chief Complaint   Patient presents with   ??? Coagulation disorder     pt/inr check       Visit Vitals   Item Reading   ??? BP 122/80 mmHg   ??? Pulse 89   ??? Temp(Src) 98.2 ??F (36.8 ??C) (Temporal)   ??? Resp 18   ??? Ht 5\' 10"  (1.778 m)   ??? Wt 215 lb (97.523 kg)   ??? BMI 30.85 kg/m2   ??? SpO2 97%       General: NAD. A x O x 3     Extremities:  Strength 4/4, no cyanosis, no edema, negative homans       Anticoagulation  Patient here for followup of chronic anticoagulation.  Indication: DVT   Bleeding Signs/Symptoms:  None  Thromboembolic Signs/Symptoms:  None  Missed Coumadin Doses:  This week  Coumadin was held Thursday and Friday  Medication Changes:  no  Dietary Changes:  no  Bacterial/Viral Infection:  no  Coumadin Flowsheet Values Recorded Today:  1.2 ( on HIS machine) -- pt worried that his machine is broken; will order PT/INR venous and confirm    Patient held coumadin on Thur and Fri. And took 5mg  on Sat and Sun    Alternate 6 and 5     M W F sat.  6 mg  Sunday T TH 5 mg     Pt refuses Lovenox     BP Readings from Last 3 Encounters:   02/10/14 122/80   02/06/14 128/82   01/23/14 122/80        Warned of risk of blood clot. Warned to go to ER if any symptoms occur       ICD-9-CM ICD-10-CM    1. Anticoagulated on Coumadin V58.83 Z51.81 AMB POC PT/INR    V58.61 Z79.01 PROTHROMBIN TIME + INR   2. Subtherapeutic anticoagulation V58.83 Z51.81     V58.61 Z79.01        Donnella Bi, DO

## 2014-02-11 NOTE — Progress Notes (Signed)
Quick Note:        Done.    ______

## 2014-02-16 MED ORDER — WARFARIN 5 MG TAB
5 mg | ORAL_TABLET | ORAL | Status: DC
Start: 2014-02-16 — End: 2014-02-19

## 2014-02-19 MED ORDER — WARFARIN 5 MG TAB
5 mg | ORAL_TABLET | ORAL | Status: DC
Start: 2014-02-19 — End: 2014-06-16

## 2014-02-19 NOTE — Telephone Encounter (Signed)
PT called requesting refill on coumadin

## 2014-02-21 NOTE — Telephone Encounter (Signed)
Pt inr 1.2, pt says he is now taking 6 mg Coumadin daily

## 2014-03-26 NOTE — Progress Notes (Signed)
HISTORY OF PRESENT ILLNESS  Julian Bailey is a 68 y.o. male.    has a past medical history of Hypertension; Arthritis; BPH (benign prostatic hyperplasia); DVT (deep venous thrombosis) (HCC) (x 2 2009  and 2011 ); ED (erectile dysfunction); Thromboembolus (HCC); and Cervical disc disease.    Chief Complaint   Patient presents with   ??? Follow-up         HPI  The patient is a very pleasant 68 year old white male with the above listed past medical history who presents to the office today to follow up care.  The patient presents to follow up for longstanding hypertension.  Pt is a  is taking BP at home has been well controlled. He denies any symptoms referable to hypertension. This would include but is not limited to headache, chest pain, shortness of breath, nausea, vomiting and neurological deficits.  The patient states that he has not had any side effects on the medication.  He denies any cough associated with the Lisinopril and overall is doing well.      BP Readings from Last 3 Encounters:   03/26/14 118/74   02/10/14 122/80   02/06/14 128/82       The patient presents to follow up for chronic anticoagulation on Coumadin.  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  The patient takes his PT and INR at home and the numbers are called or faxed to the office.  He has had stable levels for several months.  He is doing well, no signs of bleeding, no changes in diet, no recent illness.      The patient also presents to follow up today for longstanding elevated cholesterol.  He has not had his cholesterol checked in several months.  He has been trying to follow a diet closely.  He does intermittently exercise.  He is trying to be more active.  He is recently a newlywed.  Overall the patient states the is doing quite well.    Lab Results   Component Value Date/Time    CHOLESTEROL, TOTAL 162 09/23/2013 09:35 AM    HDL CHOLESTEROL 46 09/23/2013 09:35 AM    LDL, CALCULATED 92.16 09/23/2013 09:35 AM     VLDL, CALCULATED 29.8 09/23/2013 09:35 AM    TRIGLYCERIDE 149 09/23/2013 09:35 AM       The patient does not have known history of coronary artery disease.        Patient presents for evaluation of  Radiating leg pain R>L, numbness in feet with walking.  Walking on flat ground causes complete numbness in feet.  Sits downs and rest for three or 4 minutes relieves the pain.   is described as aching, shooting, and is intermittent .  Associated symptoms include: numbness in feet.  He has had a few elevated bs before but no hx of DM    Related to injury:   No.  He has bilateral hemosiderin pigmentation, with no leg hair, and ulcerating rashes on LE being followed by Derm, without evidence of cellulitis.     The patient also presents today to discuss possible obstructive sleep apnea.  The patient has a longstanding history of snoring.  Has been able to sleep on his side to decrease it, however at this point his wife is now sleeping in an opposite bedroom.  States that it is loud snoring even on his side. At times she hears him gasp for air and quit breathing.  He is positive for resuscitative snorts and  daytime somnolence.  The patient states he does not feel that it is not effecting his driving.  Discussed with the patient that if he is drowsy not to drive.  He drinks coffee in the morning to get going but none throughout the rest of the day.  The patient is 5'10", 212 pounds.            Preventative Health  Requests flu shot  flu shot today     Review of Systems   Constitutional: Negative for fever, chills, weight loss, malaise/fatigue and diaphoresis.   HENT: Negative for congestion and sore throat.    Eyes: Negative for blurred vision and photophobia.   Respiratory: Negative for cough and shortness of breath.    Cardiovascular: Negative for chest pain, palpitations and leg swelling.   Gastrointestinal: Negative for heartburn, nausea, vomiting, abdominal pain, diarrhea, constipation, blood in stool and melena.    Genitourinary: Negative for dysuria.   Musculoskeletal: Negative for myalgias.   Skin: Postive for rash- Seeing Dr. Melrose Nakayama -- recurrent  Contact Dermatitis    Neurological: Negative for dizziness, loss of consciousness, weakness and headaches.   Psychiatric/Behavioral: Negative.      Past Medical History   Diagnosis Date   ??? Hypertension    ??? Arthritis    ??? BPH (benign prostatic hyperplasia)    ??? DVT (deep venous thrombosis) (HCC) x 2 2009  and 2011      after knee surgery then after long car ride    ??? ED (erectile dysfunction)    ??? Thromboembolus (HCC)    ??? Cervical disc disease      Past Surgical History   Procedure Laterality Date   ??? 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 Hx endoscopy       colonoscopy last was in 2008    ??? Hx orthopaedic       Family History   Problem Relation Age of Onset   ??? Arthritis-osteo Mother    ??? Hypertension Mother    ??? Arthritis-osteo Father    ??? Arthritis-osteo Brother    ??? Cancer Paternal Uncle      unknown   ??? Cancer Paternal Uncle      stomach   ??? Diabetes Paternal Uncle      History     Social History   ??? Marital Status: MARRIED     Spouse Name: N/A     Number of Children: N/A   ??? Years of Education: N/A     Occupational History   ??? Not on file.     Social History Main Topics   ??? Smoking status: Former Smoker -- 0.50 packs/day for 20 years     Types: Cigarettes     Quit date: 11/30/1984   ??? Smokeless tobacco: Never Used   ??? Alcohol Use: Yes      Comment: 3 glasses of wine "couple of drinks"    ??? Drug Use: No   ??? Sexual Activity:     Partners: Female     Other Topics Concern   ??? Blood Transfusions No     Social History Narrative    Medicare wellness 09/23/13      No Known Allergies  Current Outpatient Prescriptions on File Prior to Visit   Medication Sig Dispense Refill   ??? warfarin (COUMADIN) 5 mg tablet TAKE AS DIRECTED 90 Tab 3   ??? desonide (DESOWEN) 0.05 % topical ointment Apply  to affected area two (  2) times a day. 15 g 2    ??? lisinopril (PRINIVIL, ZESTRIL) 20 mg tablet Take 1 Tab by mouth daily. 30 Tab 2   ??? warfarin (COUMADIN) 1 mg tablet As directed 180 tablet 2   ??? fexofenadine (ALLEGRA) 180 mg tablet Take  by mouth daily.     ??? vardenafil (LEVITRA) 10 mg tablet Take 10 mg by mouth as needed. One hour prior to desired affect 30 Tab 1   ??? multivitamin (ONE A DAY) tablet Take 1 Tab by mouth daily.       No current facility-administered medications on file prior to visit.       Filed Vitals:    03/26/14 0901   BP: 118/74   Pulse: 76   Temp: 98 ??F (36.7 ??C)   TempSrc: Temporal   Resp: 18   Height: 5\' 10"  (1.778 m)   Weight: 218 lb (98.884 kg)   SpO2: 98%   .  Body mass index is 31.28 kg/(m^2).            Physical Exam   Nursing note and vitals reviewed.  Constitutional: He is oriented to person, place, and time. He appears well-developed and well-nourished. No distress.   HENT:   Head: Normocephalic and atraumatic.   Right Ear: External ear normal.   Left Ear: External ear normal.   Nose: Nose normal.   Mouth/Throat: Oropharynx is clear and moist. No oropharyngeal exudate.   Eyes: Conjunctivae are normal. Pupils are equal, round, and reactive to light. No scleral icterus.   Neck: Normal range of motion. Neck supple. No JVD present. No tracheal deviation present.   Cardiovascular: Normal rate, regular rhythm, normal heart sounds and intact distal pulses.  Exam reveals no gallop and no friction rub.    No murmur heard.  Pulmonary/Chest: Effort normal and breath sounds normal. No respiratory distress.   Abdominal: Soft. Bowel sounds are normal. He exhibits no distension and no mass. There is no tenderness.   Musculoskeletal: Normal range of motion. He exhibits trace LE edema. Pulses intact and symmetrical.  Pain with palpation of bilateral Sacroiliac joint. Negative straight leg and FABRE     Lymphadenopathy:     He has no cervical adenopathy.   Neurological: He is alert and oriented to person, place, and time.    Skin: Skin is warm and dry. He has bilateral hemosiderin pigmentation, with no leg hair, and ulcerating rashes on LE being followed by Derm, without evidence of cellulitis.   Erythematous rash on extremities   Psychiatric: He has a normal mood and affect. His behavior is normal.       ASSESSMENT and PLAN    ICD-9-CM ICD-10-CM    1. Anticoagulated on Coumadin V58.83 Z51.81 CBC WITH AUTOMATED DIFF    V58.61 Z79.01 METABOLIC PANEL, COMPREHENSIVE      LIPID PANEL      TSH, 3RD GENERATION   2. Dyslipidemia 272.4 E78.5 CBC WITH AUTOMATED DIFF      METABOLIC PANEL, COMPREHENSIVE      LIPID PANEL      TSH, 3RD GENERATION   3. Essential hypertension 401.9 I10 CBC WITH AUTOMATED DIFF      METABOLIC PANEL, COMPREHENSIVE      LIPID PANEL      TSH, 3RD GENERATION   4. Flu vaccine need V04.81 Z23 INFLUENZA VIRUS VACCINE, AFLURIA VACC, 3 YRS & >, IM, MEDICARE ONLY   5. 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 Snoring 786.09 R06.83 REFERRAL  TO SLEEP STUDIES   7. Sleep apnea 780.57 G47.30 REFERRAL TO SLEEP STUDIES   8. Daytime somnolence 780.54 G47.10 REFERRAL TO SLEEP STUDIES   9. Sacro-iliac pain 724.6 M53.3 XR SI JTS MIN 3 V   10. Numbness in feet 782.0 R20.8 EMG TWO EXTREMITIES LOWER   11. Glucose intolerance (impaired glucose tolerance) 790.22 R73.02 HEMOGLOBIN A1C     Follow-up Disposition:  Return in about 3 months (around 06/25/2014).  lab results and schedule of future lab studies reviewed with patient  reviewed diet, exercise and weight control  cardiovascular risk and specific lipid/LDL goals reviewed  reviewed medications and side effects in detail      I have discussed the above plan with the patient. Patient has also been given instructions and information on his conditions.  Patient is aware of all possible side effects and agrees with the above mentioned plan.     Recommended sodium restriction. Reviewed diet, exercise and weight control.          Recommend 1800 cal low cholesterol (low fat) lean protein whole grain diet       Discussed pathophysiology of illness, risks versus benefits of treatments,  & possible complications. Questions answered & treatment as per orders. Total time = 25 minutes with over 50% being counseling & co-ordination of care.      Donnella Bi, DO

## 2014-03-31 ENCOUNTER — Inpatient Hospital Stay: Admit: 2014-03-31 | Payer: MEDICARE | Primary: Family Medicine

## 2014-03-31 DIAGNOSIS — Z5181 Encounter for therapeutic drug level monitoring: Secondary | ICD-10-CM

## 2014-03-31 LAB — METABOLIC PANEL, COMPREHENSIVE
A-G Ratio: 1.2 (ref 1.2–2.2)
ALT (SGPT): 36 U/L (ref 12–78)
AST (SGOT): 25 U/L (ref 15–37)
Albumin: 3.9 g/dL (ref 3.4–5.0)
Alk. phosphatase: 94 U/L (ref 45–117)
Anion gap: 8 mmol/L (ref 6–15)
BUN/Creatinine ratio: 16 (ref 7–25)
BUN: 13 MG/DL (ref 7–18)
Bilirubin, total: 0.9 MG/DL (ref <1.1)
CO2: 26 mmol/L (ref 21–32)
Calcium: 9.3 MG/DL (ref 8.5–10.1)
Chloride: 106 mmol/L (ref 98–107)
Creatinine: 0.8 MG/DL (ref 0.60–1.30)
GFR est AA: >60 mL/min/{1.73_m2} (ref >60)
GFR est non-AA: >60 mL/min/{1.73_m2} (ref >60)
Globulin: 3.2 g/dL (ref 2.4–3.5)
Glucose: 96 mg/dL (ref 70–110)
Potassium: 4.2 mmol/L (ref 3.5–5.3)
Protein, total: 7.1 g/dL (ref 6.4–8.2)
Sodium: 140 mmol/L (ref 136–145)

## 2014-03-31 LAB — CBC WITH AUTOMATED DIFF
ABS. BASOPHILS: 0 10*3/uL (ref 0.0–0.1)
ABS. EOSINOPHILS: 0.4 10*3/uL (ref 0.0–0.5)
ABS. LYMPHOCYTES: 1.1 10*3/uL (ref 0.8–3.5)
ABS. MONOCYTES: 0.5 10*3/uL — ABNORMAL LOW (ref 0.8–3.5)
ABS. NEUTROPHILS: 3.2 10*3/uL (ref 1.5–8.0)
BASOPHILS: 1 % (ref 0–2)
EOSINOPHILS: 8 % — ABNORMAL HIGH (ref 0–5)
HCT: 44.7 % (ref 41–53)
HGB: 15.7 g/dL (ref 13.5–17.5)
LYMPHOCYTES: 21 % (ref 19–48)
MCH: 33.4 PG — ABNORMAL HIGH (ref 27–31)
MCHC: 35.1 g/dL (ref 31–37)
MCV: 95.1 FL (ref 78–98)
MONOCYTES: 9 % (ref 3–9)
MPV: 10.1 FL (ref 5.9–10.3)
NEUTROPHILS: 61 % (ref 40–74)
PLATELET: 109 10*3/uL — ABNORMAL LOW (ref 130–400)
RBC: 4.7 M/uL (ref 4.7–6.1)
RDW: 14 % (ref 11.5–14.5)
WBC: 5.2 10*3/uL (ref 4.5–10.8)

## 2014-03-31 LAB — LIPID PANEL
Cholesterol, total: 160 MG/DL (ref <200)
HDL Cholesterol: 51 MG/DL (ref 32–96)
LDL, calculated: 93 MG/DL (ref <130)
Triglyceride: 100 MG/DL (ref <150)
VLDL, calculated: 20 MG/DL (ref 5–32)

## 2014-03-31 LAB — HEMOGLOBIN A1C WITH EAG: Hemoglobin A1c: 4.7 % (ref 4.2–6.3)

## 2014-03-31 LAB — TSH 3RD GENERATION: TSH: 1.19 u[IU]/mL (ref 0.35–3.74)

## 2014-03-31 NOTE — Progress Notes (Signed)
Labs were collected 845 am per Dr. [name], patient was fasting  1-green tube

## 2014-03-31 NOTE — Progress Notes (Signed)
Quick Note:        Overall labs look very good, there are a few labs that are outside the standard normal range, clinical significance doubtful    Offer to send copy of results to pt in mail        ______

## 2014-03-31 NOTE — Progress Notes (Signed)
Labs were collected @ 830 am per Dr. [name], patient was fasting  2-purple tubes  1-green tube

## 2014-03-31 NOTE — Progress Notes (Signed)
Labs were collected @ 830 am per Dr. Miller, patient was fasting  1-green tube  2-purple tubes

## 2014-04-01 NOTE — Progress Notes (Signed)
Quick Note:        Done.    ______

## 2014-04-18 ENCOUNTER — Inpatient Hospital Stay: Admit: 2014-04-18 | Payer: MEDICARE | Primary: Family Medicine

## 2014-04-18 ENCOUNTER — Inpatient Hospital Stay: Admit: 2014-04-18 | Payer: MEDICARE | Attending: Family Medicine | Primary: Family Medicine

## 2014-04-18 DIAGNOSIS — I739 Peripheral vascular disease, unspecified: Secondary | ICD-10-CM

## 2014-04-18 DIAGNOSIS — R200 Anesthesia of skin: Secondary | ICD-10-CM

## 2014-04-18 NOTE — Procedures (Signed)
[facility]      PT Name:  Julian Bailey, Julian Bailey        Admitted:  04/18/2014  MR#:  [id number]                     DOB:  April 13, 1946  Account #:  [id number]            Age:  [age]  Dictator:  Toniann FailJoseph Kei Mcelhiney           Location:          ELECTROMYOGRAPHY    DATE OF STUDY:   04/18/2014    CLINICAL HISTORY:  This is a 68 year old male complaining of numbness, tingling, pain  in both legs and feet.  Symptoms started around the back of the  thighs, radiate to the lower legs.  They worsen when he walks,  improve with rest.  No history of diabetes.  He does have some low  back pain.  He had ABI test today.  He has severe skin lesions from  what appears to be impaired vascular flow.  He also has severe  hammertoes.    TECHNICAL INFORMATION:  Bilateral lower extremity nerve conductions and electromyography and  bilateral lumbar paraspinal electromyography were performed.    INTERPRETATION:  Motor Nerves:  Bilateral peroneal and tibial distal motor latencies  were normal at 3.5 to 4.7 milliseconds, normal is less than 6.  Amplitudes were normal except for the right tibial which was low.  Proximal velocities ranged from borderline 40 m/sec to 43 m/sec  which is normal, normal being greater than 40 m/sec.    F-Responses for both tibial nerves were prolonged at over 60  milliseconds.  Left peroneal was borderline at 55 milliseconds.  Right peroneal was normal at 52 milliseconds.    No Sural or H-Responses were obtainable.    Electromyography demonstrated bilateral high, mid and low lumbar  paraspinal activity is normal.  Bilateral quadriceps, biceps  femoris, tibialis anterior, gastrocnemius and extensor digitorum  brevis had normal spontaneous activity, recruitment and amplitude.    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  No evidence supportive of a radiculopathy.  With his significant  hammertoes there may be a hereditary component to his neuropathy.  The  severe vascular skin changes suggest there may be another  process on-going as well.  Would correlate clinically.            ___________________________________  Toniann FailJoseph Siler Mavis, M.D.    JB:bsb  DD:  04/18/2014 12:15:57   DT:  04/19/2014 17:17:33  Job ID:  [id number]  CC:              Document #:  [id number]

## 2014-04-18 NOTE — Progress Notes (Signed)
Quick Note:        Let pt know ABI was normal    Donnella BiLauren E Chiniqua Kilcrease, DO        ______

## 2014-04-18 NOTE — Procedures (Signed)
[facility]      PT Name:  [name], Julian Bailey        Admitted:  04/18/2014  MR#:  [id number]                     DOB:  [birth year]  Account #:  [id number]            Age:  68  Dictator:  Toniann FailJoseph Yvette Roark           Location:          ELECTROMYOGRAPHY    DATE OF STUDY:   04/18/2014    CLINICAL HISTORY:  This is a 68 year old male complaining of numbness, tingling, pain  in both legs and feet.  Symptoms started around the back of the  thighs, radiate to the lower legs.  They worsen when he walks,  improve with rest.  No history of diabetes.  He does have some low  back pain.  He had ABI test today.  He has severe skin lesions from  what appears to be impaired vascular flow.  He also has severe  hammertoes.    TECHNICAL INFORMATION:  Bilateral lower extremity nerve conductions and electromyography and  bilateral lumbar paraspinal electromyography were performed.    INTERPRETATION:  Motor Nerves:  Bilateral peroneal and tibial distal motor latencies  were normal at 3.5 to 4.7 milliseconds, normal is less than 6.  Amplitudes were normal except for the right tibial which was low.  Proximal velocities ranged from borderline 40 m/sec to 43 m/sec  which is normal, normal being greater than 40 m/sec.    F-Responses for both tibial nerves were prolonged at over 60  milliseconds.  Left peroneal was borderline at 55 milliseconds.  Right peroneal was normal at 52 milliseconds.    No Sural or H-Responses were obtainable.    Electromyography demonstrated bilateral high, mid and low lumbar  paraspinal activity is normal.  Bilateral quadriceps, biceps  femoris, tibialis anterior, gastrocnemius and extensor digitorum  brevis had normal spontaneous activity, recruitment and amplitude.    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  No evidence supportive of a radiculopathy.  With his significant  hammertoes there may be a hereditary component to his neuropathy.   The severe vascular skin changes suggest there may be another  process on-going as well.  Would correlate clinically.            ___________________________________  Toniann FailJoseph Amandalee Lacap, M.D.    JB:bsb  DD:  04/18/2014 12:15:57   DT:  04/19/2014 17:17:33  Job ID:  [id number]  CC:              Document #:  [id number]

## 2014-04-19 NOTE — Progress Notes (Signed)
Quick Note:        Have pt follow-up his EMG of lower ex.; can be 8: 40 am on October 28 th    ______

## 2014-04-21 NOTE — Progress Notes (Signed)
Quick Note:        Done.    ______

## 2014-04-21 NOTE — Progress Notes (Signed)
Quick Note:        Patient has appt Oct 28th 8:40am to discuss results    ______

## 2014-04-21 NOTE — Progress Notes (Signed)
Quick Note:        PT has appt 04/30/14    ______

## 2014-04-23 ENCOUNTER — Ambulatory Visit: Admit: 2014-04-23 | Discharge: 2014-04-23 | Payer: MEDICARE | Attending: Family Medicine | Primary: Family Medicine

## 2014-04-23 DIAGNOSIS — R200 Anesthesia of skin: Secondary | ICD-10-CM

## 2014-04-23 NOTE — Progress Notes (Signed)
HISTORY OF PRESENT ILLNESS  Julian BottcherGregory Earl Bailey is a 68 y.o. male.    has a past medical history of Hypertension; Arthritis; BPH (benign prostatic hyperplasia); DVT (deep venous thrombosis) (HCC) (x 2 2009  and 2011 ); ED (erectile dysfunction); Thromboembolus (HCC); and Cervical disc disease.    Chief Complaint   Patient presents with   ??? Follow-up     EMG results         HPI    This is a 68 year old male with longstanding hx of anticoagulation for DVT  For follow-upnumbness, tingling, pain in both legs and feet.  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   Symptoms started around the back of the thighs, radiate to the lower legs. They worsen when he walks,improve with rest.  [place] could not feel his legs and almost feel.  As soon as he sat down symptoms resolved.   No history of diabetes. He does have some low  back pain. He had ABI test which was discussed today and EMG which was also discussed (see below)    He also has severe hammertoes.    ANKLE BRACHIAL INDEX: 3 levels with and without exercise  COMPARISON: None Available.  REASON FOR EXAM: Claudication symptoms.   FINDINGS:   Doppler waveforms are triphasic  Rest ABI right 1.21 left 1.12  Post exercise ABI right 1.17 left 1.12  IMPRESSION: Negative as described     Expand All Collapse All   [facility]  PT Name: Julian Bailey, Julian E Admitted: 04/18/2014  MR#: [id number] DOB: Nov 06, 1945  Account #: [id number] Age: [age]  Dictator: Julian FailJoseph Bailey Location:  ELECTROMYOGRAPHY  DATE OF STUDY: 04/18/2014  CLINICAL HISTORY:  This is a 68 year old male complaining of numbness, tingling, pain  in both legs and feet. Symptoms started around the back of the  thighs, radiate to the lower legs. They worsen when he walks,  improve with rest. No history of diabetes. He does have some low  back pain. He had ABI test today. He has severe skin lesions from   what appears to be impaired vascular flow. He also has severe  hammertoes.  TECHNICAL INFORMATION:  Bilateral lower extremity nerve conductions and electromyography and  bilateral lumbar paraspinal electromyography were performed.  INTERPRETATION:  Motor Nerves: Bilateral peroneal and tibial distal motor latencies  were normal at 3.5 to 4.7 milliseconds, normal is less than 6.  Amplitudes were normal except for the right tibial which was low.  Proximal velocities ranged from borderline 40 m/sec to 43 m/sec  which is normal, normal being greater than 40 m/sec.  F-Responses for both tibial nerves were prolonged at over 60  milliseconds. Left peroneal was borderline at 55 milliseconds.  Right peroneal was normal at 52 milliseconds.  No Sural or H-Responses were obtainable.  Electromyography demonstrated bilateral high, mid and low lumbar  paraspinal activity is normal. Bilateral quadriceps, biceps  femoris, tibialis anterior, gastrocnemius and extensor digitorum  brevis had normal spontaneous activity, recruitment and amplitude.  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  No evidence supportive of a radiculopathy. With his significant  hammertoes there may be a hereditary component to his neuropathy.  The severe vascular skin changes suggest there may be another  process on-going as well. Would correlate clinically.                Review of Systems  A thorough 10 point review of systems was done and was unremarkable except for HPI and chronic medical conditions.           Past Medical History   Diagnosis Date   ??? Hypertension    ??? Arthritis    ??? BPH (benign prostatic hyperplasia)    ??? DVT (deep venous thrombosis) (HCC) x 2 2009  and 2011      after knee surgery then after long car ride    ??? ED (erectile dysfunction)    ??? Thromboembolus (HCC)    ??? Cervical disc disease      Past Surgical History   Procedure Laterality Date   ??? 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 Hx endoscopy       colonoscopy last was in 2008    ??? Hx orthopaedic       Family History   Problem Relation Age of Onset   ??? Arthritis-osteo Mother    ??? Hypertension Mother    ??? Arthritis-osteo Father    ??? Arthritis-osteo Brother    ??? Cancer Paternal Uncle      unknown   ??? Cancer Paternal Uncle      stomach   ??? Diabetes Paternal Uncle      History     Social History   ??? Marital Status: MARRIED     Spouse Name: N/A     Number of Children: N/A   ??? Years of Education: N/A     Occupational History   ??? Not on file.     Social History Main Topics   ??? Smoking status: Former Smoker -- 0.50 packs/day for 20 years     Types: Cigarettes     Quit date: 11/30/1984   ??? Smokeless tobacco: Never Used   ??? Alcohol Use: Yes      Comment: 3 glasses of wine "couple of drinks"    ??? Drug Use: No   ??? Sexual Activity:     Partners: Female     Other Topics Concern   ??? Blood Transfusions No     Social History Narrative    Medicare wellness 09/23/13      No Known Allergies  Current Outpatient Prescriptions on File Prior to Visit   Medication Sig Dispense Refill   ??? warfarin (COUMADIN) 5 mg tablet TAKE AS DIRECTED 90 Tab 3   ??? desonide (DESOWEN) 0.05 % topical ointment Apply  to affected area two (2) times a day. 15 g 2   ??? lisinopril (PRINIVIL, ZESTRIL) 20 mg tablet Take 1 Tab by mouth daily. 30 Tab 2   ??? warfarin (COUMADIN) 1 mg tablet As directed 180 tablet 2   ??? vardenafil (LEVITRA) 10 mg tablet Take 10 mg by mouth as needed. One hour prior to desired affect 30 Tab 1   ??? multivitamin (ONE A DAY) tablet Take 1 Tab by mouth daily.     ??? fexofenadine (ALLEGRA) 180 mg tablet Take  by mouth daily.       No current facility-administered medications on file prior to visit.       Filed Vitals:    04/23/14 0849   BP: 130/78   Pulse: 94   Temp: 97.4 ??F (36.3 ??C)   TempSrc: Temporal   Resp: 18   Height: 5\' 10"  (1.778 m)   Weight: 218 lb (98.884 kg)   SpO2: 97%   .  Body mass index is 31.28 kg/(m^2).  Physical Exam    Nursing note and vitals reviewed.  Constitutional: He is oriented to person, place, and time. He appears well-developed and well-nourished. No distress.   HENT:   Head: Normocephalic and atraumatic.   Right Ear: External ear normal.   Left Ear: External ear normal.   Nose: Nose normal.   Mouth/Throat: Oropharynx is clear and moist. No oropharyngeal exudate.   Eyes: Conjunctivae are normal. Pupils are equal, round, and reactive to light. No scleral icterus.   Neck: Normal range of motion. Neck supple. No JVD present. No tracheal deviation present.   Cardiovascular: Normal rate, regular rhythm, normal heart sounds and intact distal pulses.  Exam reveals no gallop and no friction rub.    No murmur heard.  Pulmonary/Chest: Effort normal and breath sounds normal. No respiratory distress.   Abdominal: Soft. Bowel sounds are normal. He exhibits no distension and no mass. There is no tenderness.   Musculoskeletal: Normal range of motion. He exhibits trace LE edema. Pulses intact and symmetrical.  Pain with palpation of bilateral Sacroiliac joint. Negative straight leg and FABRE     Lymphadenopathy:     He has no cervical adenopathy.   Neurological: He is alert and oriented to person, place, and time.   Skin: Skin is warm and dry. He has bilateral hemosiderin pigmentation, with no leg hair, and ulcerating rashes on LE being followed by Derm, without evidence of cellulitis.   Erythematous rash on extremities   Psychiatric: He has a normal mood and affect. His behavior is normal.       ASSESSMENT and PLAN    ICD-10-CM ICD-9-CM    1. Numbness and tingling of both legs R20.2 782.0 REFERRAL TO VASCULAR CENTER   2. Claudication (HCC) I73.9 443.9 REFERRAL TO VASCULAR CENTER   3. Anticoagulated on Coumadin Z51.81 V58.83     Z79.01 V58.61    4. Chronic venous stasis dermatitis of both lower extremities I83.11 454.1     I83.12       Follow-up Disposition:  3 months    lab results and schedule of future lab studies reviewed with patient  reviewed diet, exercise and weight control  cardiovascular risk and specific lipid/LDL goals reviewed  reviewed medications and side effects in detail      I have discussed the above plan with the patient. Patient has also been given instructions and information on his conditions.  Patient is aware of all possible side effects and agrees with the above mentioned plan.     Donnella BiLauren E Johanna Stafford, DO

## 2014-04-30 ENCOUNTER — Encounter: Attending: Family Medicine | Primary: Family Medicine

## 2014-05-01 ENCOUNTER — Encounter: Attending: Urology | Primary: Family Medicine

## 2014-05-02 ENCOUNTER — Encounter: Attending: Urology | Primary: Family Medicine

## 2014-05-20 NOTE — Telephone Encounter (Signed)
Dr. 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

## 2014-06-06 ENCOUNTER — Ambulatory Visit: Admit: 2014-06-06 | Discharge: 2014-06-06 | Payer: MEDICARE | Attending: Physician Assistant | Primary: Family Medicine

## 2014-06-06 DIAGNOSIS — R3912 Poor urinary stream: Secondary | ICD-10-CM

## 2014-06-06 LAB — AMB POC URINALYSIS DIP STICK AUTO W/O MICRO
Blood (UA POC): NEGATIVE
Glucose (UA POC): NEGATIVE
Ketones (UA POC): NEGATIVE
Leukocyte esterase (UA POC): NEGATIVE
Nitrites (UA POC): NEGATIVE
Protein (UA POC): NEGATIVE mg/dL
Specific gravity (UA POC): 1.015 (ref 1.001–1.035)
pH (UA POC): 7.5 (ref 4.6–8.0)

## 2014-06-06 LAB — AMB POC CREATININE ASSAY, OTHER SOURCE
Creatinine, other source: 50
Source: NORMAL

## 2014-06-06 MED ORDER — LISINOPRIL 20 MG TAB
20 mg | ORAL_TABLET | ORAL | Status: DC
Start: 2014-06-06 — End: 2015-06-08

## 2014-06-06 NOTE — Progress Notes (Signed)
Male Progress Note      Patient: Julian Bailey               Sex: male             MRN: [id number]      Date of Birth:  09-13-1945      Age:  68 y.o.               Subjective:     Julian Bailey is a 68 y.o. male who is being seen for follow-up for BPH with LUTS, incomplete bladder emptying, ED.  We last saw the patient 6 months ago for these problems.  He was started on tamsulosin 0.4 mg daily.  No refills are needed at his time.  He feels that he's been doing much better since his last visit.  He has also started on CPAP and his nocturia is tremendously improved.  His stream has improved.  He does feel that his bladder empties about 90% of the time.  No dysuria or gross hematuria.  He takes Levitra 10 mg PRN with good results.  No refills are needed at this time.  Frequency and urgency are also improved.  He is drinking more water, 2-3 bottles daily.  He also drinks wine.  His last PSA was 2.2.    15 minutes were spent in face-to-face time with the patient at his visit today, >50% of which were spent in counseling in treatment options and outcomes, dietary modifications, and answering all questions.     Past Medical History   Diagnosis Date   ??? Hypertension    ??? Arthritis    ??? BPH (benign prostatic hyperplasia)    ??? DVT (deep venous thrombosis) (HCC) x 2 2009  and 2011      after knee surgery then after long car ride    ??? ED (erectile dysfunction)    ??? Thromboembolus (HCC)    ??? Cervical disc disease        Past Surgical History   Procedure Laterality Date   ??? 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 Hx endoscopy       colonoscopy last was in 2008    ??? Hx orthopaedic         Family History   Problem Relation Age of Onset   ??? Arthritis-osteo Mother    ??? Hypertension Mother    ??? Arthritis-osteo Father    ??? Arthritis-osteo Brother    ??? Cancer Paternal Uncle      unknown   ??? Cancer Paternal Uncle      stomach   ??? Diabetes Paternal Uncle        History     Social History   ??? Marital Status: MARRIED      Spouse Name: N/A     Number of Children: N/A   ??? Years of Education: N/A     Social History Main Topics   ??? Smoking status: Former Smoker -- 0.50 packs/day for 20 years     Types: Cigarettes     Quit date: 11/30/1984   ??? Smokeless tobacco: Never Used   ??? Alcohol Use: Yes      Comment: 3 glasses of wine "couple of drinks"    ??? Drug Use: No   ??? Sexual Activity:     Partners: Female     Other Topics Concern   ??? Blood Transfusions No     Social History Narrative  Medicare wellness 09/23/13        Prior to Admission medications    Medication Sig Start Date End Date Taking? Authorizing Provider   tamsulosin (FLOMAX) 0.4 mg capsule Take 0.4 mg by mouth daily.   Yes Historical Provider   warfarin (COUMADIN) 5 mg tablet TAKE AS DIRECTED 02/19/14  Yes Lauren Samara DeistE Miller, DO   desonide (DESOWEN) 0.05 % topical ointment Apply  to affected area two (2) times a day. 01/23/14  Yes Lauren Samara DeistE Miller, DO   lisinopril (PRINIVIL, ZESTRIL) 20 mg tablet Take 1 Tab by mouth daily. 01/16/14  Yes Donnella BiLauren E Miller, DO   warfarin (COUMADIN) 1 mg tablet As directed 05/13/13  Yes Lauren Samara DeistE Miller, DO   vardenafil (LEVITRA) 10 mg tablet Take 10 mg by mouth as needed. One hour prior to desired affect 12/20/11  Yes Donnella BiLauren E Miller, DO   multivitamin (ONE A DAY) tablet Take 1 Tab by mouth daily.   Yes Historical Provider   fexofenadine (ALLEGRA) 180 mg tablet Take  by mouth daily.    Historical Provider       No Known Allergies    Immunization History   Administered Date(s) Administered   ??? Influenza Vaccine 03/26/2014   ??? Tdap 05/13/2013         Review of Systems:  General:  no weight loss, fever, night sweats  Head:   negative  Eyes:   negative  Respiratory:  Patient denies cough, chest pain, dyspnea, wheezing or hemoptysis  Cardiac:  negative  Gatrointestinal: negative  Urinary:  BPH  Musculoskeletal: back pain  Endocrine:  Denies: polydipsia/polyuria  Hematological: there is no easy bleeding or bruising  Psychological: negative   Skin:   Denies: rash, itching      Objective:      Visit Vitals   Item Reading   ??? BP 145/93 mmHg   ??? Pulse 80   ??? Temp(Src) 99.1 ??F (37.3 ??C) (Oral)   ??? Ht 5\' 10"  (1.778 m)   ??? Wt 220 lb 8 oz (100.018 kg)   ??? BMI 31.64 kg/m2         Labs:  Urinalysis:    Results for orders placed or performed in visit on 10/30/13   AMB POC URINALYSIS DIP STICK AUTO W/O MICRO     Status: None   Result Value Ref Range Status    Color (UA POC) Yellow  Final    Clarity (UA POC) Clear  Final    Glucose (UA POC) Negative Negative Final    Bilirubin (UA POC)  Negative     Ketones (UA POC) Negative Negative Final    Specific gravity (UA POC) 1.020 1.001 - 1.035 Final    Blood (UA POC) Negative Negative Final    pH (UA POC) 6.0 4.6 - 8.0 Final    Protein (UA POC) Negative Negative mg/dL Final    Urobilinogen (UA POC)  0.2 - 1     Nitrites (UA POC) Negative Negative Final    Leukocyte esterase (UA POC) Negative Negative Final       Lab Results   Component Value Date/Time    PROSTATE SPECIFIC AG 2.2 09/23/2013 09:35 AM    PROSTATE SPECIFIC AG 3.8 02/29/2012 08:55 AM        Imaging:   None reviewed.    PVR:  170 mL was 165 mL    SHIM Score: 24 (No ED)    AUA/QOL score:  8/1 was 23/5      Physical  Exam:   Constitutional: Normal appearing patient with well groomed; No deformities; Body habitus appears normal; Nutritionally sound.   Neck: Supple; No JVD; No cervical lymphadenopathy; No carotid bruits; No masses; trachea midline; No thyromegaly  Respiratory: No use of accessory muscles; No intercostal retractions; Clear to auscultation bilaterally  Heart: Regular rate and rhythm; No murmurs, rubs, gallops  GI: soft, non tender, normal bowel sounds, no palpable masses, no organomegaly, obese    GU:   Deferred    Digital Rectal Exam:   Deferred    Musculoskeletal:   No CVA tenderness; Good sensation, ROM, and strength bilaterally of the Upper and Lower extremities.    Assessment/Plan      1. Weak stream, improved - continue tamsulosin 0.4 mg daily.  No refills are needed.  2. Nocturia, improved - Decrease fluids 3 hours prior to bedtime.  3. ED - Continue Levitra 10 mg and PRN. No refills needed.  4. Frequency, improved - Patient was advised to increase water intake to at least 64 oz daily and decrease coffee, colas, teas, etc.  5. Urgency, improved - Patient was advised to increase water intake to at least 64 oz daily and decrease coffee, colas, teas, etc.   6. Incomplete bladder emptying - continue tamsulosin 0.4 mg daily. Possible side effects of this medication were discussed with the patient to include orthostasis and retrograde ejaculation. The patient was advised to take this medication at least 2 hours apart from the PDE5 inhibitors. Rx was sent to his pharmacy.  7. BPH with LUTS - continue tamsulosin 0.4 mg daily.   8. I will see back in 6 months for yearly exam and PSA.

## 2014-06-06 NOTE — Addendum Note (Signed)
Addended by: Georgina PillionKECK, Rino Hosea S on: 06/06/2014 11:47 AM      Modules accepted: Orders

## 2014-06-06 NOTE — Patient Instructions (Addendum)
[facility]     Office working hours are 8:00am-4:30pm    Office phone number is [phone] and the fax line is [phone]  For non-emergent medical care and clinical advice during office hours  1. Call the office or  2. Send a message to office staff via mychart  Emergency care can be obtained at the [facility] ER, Urgent Care or 911.    Patient Satisfaction Survey   We appreciate you sharing your e-mail address with us. Please watch for our patient  satisfaction survey which you will receive by e-mail. We strive to provide you with the   best care possible. We respect all comments and will take them into consideration to improve our service.  Thank you for your participation.    Tamsulosin (By mouth)   Tamsulosin Hydrochloride (tam-SOO-loe-sin hye-droe-KLOR-ide)  Treats benign prostatic hyperplasia (enlarged prostate).   Brand Name(s):Flomax   There may be other brand names for this medicine.  When This Medicine Should Not Be Used:   This medicine is not right for everyone. Do not use it if you had an allergic reaction to tamsulosin.  How to Use This Medicine:   Capsule  ?? Take your medicine as directed. Your dose may need to be changed several times to find what works best for you.  ?? Take this medicine 30 minutes after the same meal each day. Swallow the capsule whole. Do not crush, chew, or open it.  ?? Read and follow the patient instructions that come with this medicine. Talk to your doctor or pharmacist if you have any questions.  ?? Missed dose: Take a dose as soon as you remember. If it is almost time for your next dose, wait until then and take a regular dose. Do not take extra medicine to make up for a missed dose. If you forget to take this medicine for several days in a row, talk to your doctor before you start taking it again.  ?? Store the medicine in a closed container at room temperature, away from heat, moisture, and direct light.  Drugs and Foods to Avoid:    Ask your doctor or pharmacist before using any other medicine, including over-the-counter medicines, vitamins, and herbal products.  ?? Some medicines can affect how tamsulosin works. Tell your doctor if you are using another alpha blocker medicine, cimetidine, erythromycin, ketoconazole, paroxetine, terbinafine, warfarin, or medicine for erectile dysfunction.  Warnings While Using This Medicine:   ?? Tell your doctor if you have kidney disease, liver disease, low blood pressure, prostate cancer, or an allergy to sulfa drugs.  ?? Tell your doctor if you plan to have cataract or glaucoma surgery. This medicine may cause eye problems during surgery.  ?? This medicine may make you dizzy or lightheaded. Do not drive or do anything else that could be dangerous until you know how this medicine affects you. Stand or sit up slowly if you feel dizzy.  ?? Your doctor will check your progress and the effects of this medicine at regular visits. Keep all appointments.  ?? Keep all medicine out of the reach of children. Never share your medicine with anyone.  Possible Side Effects While Using This Medicine:   Call your doctor right away if you notice any of these side effects:  ?? Allergic reaction: Itching or hives, swelling in your face or hands, swelling or tingling in your mouth or throat, chest tightness, trouble breathing  ?? Blistering, peeling, red skin rash  ?? Lightheadedness, dizziness, fainting  ??   Painful, prolonged erection of your penis  If you notice these less serious side effects, talk with your doctor:   ?? Headache  ?? Problems with ejaculation  ?? Runny or stuffy nose  If you notice other side effects that you think are caused by this medicine, tell your doctor.   Call your doctor for medical advice about side effects. You may report side effects to FDA at 1-800-FDA-1088  ?? 2014 [employer] Information is for End User's use only and may not be sold, redistributed or otherwise used for commercial  purposes.  The above information is an educational aid only. It is not intended as medical advice for individual conditions or treatments. Talk to your doctor, nurse or pharmacist before following any medical regimen to see if it is safe and effective for you.    Benign Prostatic Hyperplasia: After Your Visit  Your Care Instructions     Benign prostatic hyperplasia, or BPH, is an enlarged prostate gland. The prostate is a small gland that makes some of the fluid in semen. Prostate enlargement happens to almost all men as they age. It is usually not serious. BPH does not cause prostate cancer.  As the prostate gets bigger, it may partly block the flow of urine. You may have a hard time getting a urine stream started or completely stopped. BPH can cause dribbling. You may have a weak urine stream, or you may have to urinate more often than you used to, especially at night. Most men find these problems easy to manage.  You do not need treatment unless your symptoms bother you a lot or you have other problems, such as bladder infections or stones. In these cases, medicines may help. Surgery is not needed unless the urine flow is blocked or the symptoms do not get better with medicine.  Follow-up care is a key part of your treatment and safety. Be sure to make and go to all appointments, and call your doctor if you are having problems. It's also a good idea to know your test results and keep a list of the medicines you take.  How can you care for yourself at home?  ?? Take plenty of time to urinate. Try to relax.  ?? Try "double voiding." Urinate as much you can, relax for a few moments, and then try to urinate again.  ?? Sit on the toilet to urinate.  ?? Read or think of other things while you are waiting.  ?? Turn on a faucet, or try to picture running water. Some men find that this helps get their urine flowing.  ?? If dribbling is a problem, wash your penis daily to avoid skin irritation and infection.   ?? Avoid caffeine and alcohol. These drinks will increase how often you need to urinate. Spread your fluid intake throughout the day. If the urge to urinate often wakes you at night, limit your fluid intake in the evening. Urinate right before you go to bed.  ?? Many over-the-counter cold and allergy medicines can make the symptoms of BPH worse. Avoid antihistamines, decongestants, and allergy pills, if you can. Read the warnings on the package.  ?? If you take any prescription medicines, especially tranquilizers or antidepressants, ask your doctor or pharmacist whether they can cause urination problems. There may be other medicines you can use that do not cause urinary problems.  ?? Be safe with medicines. Take your medicines exactly as prescribed. Call your doctor if you think you are having   a problem with your medicine.  When should you call for help?  Call your doctor now or seek immediate medical care if:  ?? You cannot urinate at all.  ?? You have symptoms of a urinary infection. For example:  ?? You have blood or pus in your urine.  ?? You have pain in your back just below your rib cage. This is called flank pain.  ?? You have a fever, chills, or body aches.  ?? It hurts to urinate.  ?? You have groin or belly pain.  Watch closely for changes in your health, and be sure to contact your doctor if:  ?? It hurts when you ejaculate.  ?? Your urinary problems get a lot worse or bother you a lot.   Where can you learn more?   Go to http://www.healthwise.net/[facility]  Enter C033 in the search box to learn more about "Benign Prostatic Hyperplasia: After Your Visit."   ?? 2006-2015 [employer]. Care instructions adapted under license by [place] (which disclaims liability or warranty for this information). This care instruction is for use with your licensed healthcare professional. If you have questions about a medical condition or this instruction, always ask your healthcare professional. Healthwise,  Incorporated disclaims any warranty or liability for your use of this information.  Content Version: 10.5.422740; Current as of: November 28, 2012

## 2014-06-15 ENCOUNTER — Encounter

## 2014-06-16 MED ORDER — WARFARIN 5 MG TAB
5 mg | ORAL_TABLET | ORAL | Status: DC
Start: 2014-06-16 — End: 2015-09-09

## 2014-06-16 MED ORDER — WARFARIN 1 MG TAB
1 mg | ORAL_TABLET | ORAL | Status: DC
Start: 2014-06-16 — End: 2015-09-09

## 2014-08-28 ENCOUNTER — Encounter: Attending: Family Medicine | Primary: Family Medicine

## 2014-09-10 ENCOUNTER — Ambulatory Visit: Admit: 2014-09-10 | Discharge: 2014-09-10 | Payer: MEDICARE | Attending: Family Medicine | Primary: Family Medicine

## 2014-09-10 DIAGNOSIS — M48061 Spinal stenosis, lumbar region without neurogenic claudication: Secondary | ICD-10-CM

## 2014-09-10 MED ORDER — MELOXICAM 15 MG TAB
15 mg | ORAL_TABLET | Freq: Every day | ORAL | Status: DC | PRN
Start: 2014-09-10 — End: 2015-11-26

## 2014-09-10 MED ORDER — GABAPENTIN 300 MG CAP
300 mg | ORAL_CAPSULE | Freq: Every evening | ORAL | Status: DC
Start: 2014-09-10 — End: 2015-05-04

## 2014-09-11 NOTE — Progress Notes (Signed)
HISTORY OF PRESENT ILLNESS  Julian Bailey is a 69 y.o. male.    has a past medical history of Hypertension; Arthritis; BPH (benign prostatic hyperplasia); DVT (deep venous thrombosis) (HCC) (x 2 2009  and 2011 ); ED (erectile dysfunction); Thromboembolus (HCC); and Cervical disc disease.    Chief Complaint   Patient presents with   ??? Follow-up     MRI results,          HPI    This is a 69 year old male with longstanding hx of anticoagulation for DVT  For follow-up numbness, tingling, pain in both legs and feet with associated low back pain.  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   Symptoms started in the low back and radiates downback of the thighs, radiate to the lower legs. They worsen when he walks,improve with rest.    No history of diabetes. He does have some lowback pain. He had ABI test which was discussed today and EMG which was also discussed (see below)    He also has severe hammertoes.  He has evaluated by Vascular who felt primary issues was back and ordered MRI.  MRI (reviewed in Detail) Pt states that he is generally very active likes to exercise with wife but back pain is preventing him from doing hardly any physical activity.   Study Result       EXAM: MR of the lumbar spine without contrast dated 05/06/2014   CLINICAL INDICATION: Back pain radiating to both legs.   PROCEDURE: Standard protocol was performed without contrast on a 1.0 Tesla   Phillips open MR scanner.   FINDINGS:   The vertebral body and disc heights are predominately maintained. Mild narrowing   is seen at the L4-L5 and L5-S1 level. In tear ossified to seen at T11-L2 and at   L3-L5. Degenerative changes are seen in the facet joints. The alignment of the   lumbar spine is maintained. The cord terminates at L1. The cord shows normal   caliber and signal.   T12-L1: No stenosis of neuroforamina or central canal.   L1-L2: No stenosis of neuroforamina or central canal.    L2-L3: Diffuse disc bulge is seen. There is left paracentral annular fissure   with focal disc herniation. The neuroforamen is mallet narrowed. The center   canal is narrowed.   L3-L4: Diffuse is bulge is seen. There is spinal stenosis. Degenerative changes   are seen in the facet joints. There is moderate neuroforaminal narrowing.   L4-L5: Diffuse disc bulge is seen. Hypertrophic changes are seen in the facet   joints. The neuroforamen show moderate narrowing bilaterally. Degenerative   changes are seen in the facet joints. Spinal stenosis is seen.   L5-S1: Hypertrophic changes are seen in the facet joints. There is diffuse disc   bulge. There foramen show moderate narrowing.   Other findings: There is a incompletely seen T2 hyperintense muscle on lateral   cortex of the right kidney. Is there represent a cyst. Further evaluation is   recommended. There is motion.   IMPRESSION:   Spinal stenosis.   Degenerative disc disease.   Possible incompletely imaged cyst on the right kidney. Further evaluation is   recommended.        ANKLE BRACHIAL INDEX: 3 levels with and without exercise  COMPARISON: None Available.  REASON FOR EXAM: Claudication symptoms.   FINDINGS:   Doppler waveforms are triphasic  Rest ABI right 1.21 left 1.12  Post exercise  ABI right 1.17 left 1.12  IMPRESSION: Negative as described     Expand All Collapse All   [facility]  PT Name: [name], Caellum E Admitted: 04/18/2014  MR#: [id number] DOB: 18-Aug-1945  Account #: [id number] Age: [age]  Dictator: Toniann FailJoseph Bajorek Location:  ELECTROMYOGRAPHY  DATE OF STUDY: 04/18/2014  CLINICAL HISTORY:  This is a 69 year old male complaining of numbness, tingling, pain  in both legs and feet. Symptoms started around the back of the  thighs, radiate to the lower legs. They worsen when he walks,  improve with rest. No history of diabetes. He does have some low  back pain. He had ABI test today. He has severe skin lesions from   what appears to be impaired vascular flow. He also has severe  hammertoes.  TECHNICAL INFORMATION:  Bilateral lower extremity nerve conductions and electromyography and  bilateral lumbar paraspinal electromyography were performed.  INTERPRETATION:  Motor Nerves: Bilateral peroneal and tibial distal motor latencies  were normal at 3.5 to 4.7 milliseconds, normal is less than 6.  Amplitudes were normal except for the right tibial which was low.  Proximal velocities ranged from borderline 40 m/sec to 43 m/sec  which is normal, normal being greater than 40 m/sec.  F-Responses for both tibial nerves were prolonged at over 60  milliseconds. Left peroneal was borderline at 55 milliseconds.  Right peroneal was normal at 52 milliseconds.  No Sural or H-Responses were obtainable.  Electromyography demonstrated bilateral high, mid and low lumbar  paraspinal activity is normal. Bilateral quadriceps, biceps  femoris, tibialis anterior, gastrocnemius and extensor digitorum  brevis had normal spontaneous activity, recruitment and amplitude.  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  No evidence supportive of a radiculopathy. With his significant  hammertoes there may be a hereditary component to his neuropathy.  The severe vascular skin changes suggest there may be another  process on-going as well. Would correlate clinically.                Review of Systems   A thorough 10 point review of systems was done and was unremarkable except for HPI and chronic medical conditions.           Past Medical History   Diagnosis Date   ??? Hypertension    ??? Arthritis    ??? BPH (benign prostatic hyperplasia)    ??? DVT (deep venous thrombosis) (HCC) x 2 2009  and 2011      after knee surgery then after long car ride    ??? ED (erectile dysfunction)    ??? Thromboembolus (HCC)    ??? Cervical disc disease      Past Surgical History   Procedure Laterality Date   ??? 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 Hx endoscopy       colonoscopy last was in 2008    ??? Hx orthopaedic       Family History   Problem Relation Age of Onset   ??? Arthritis-osteo Mother    ??? Hypertension Mother    ??? Arthritis-osteo Father    ??? Arthritis-osteo Brother    ??? Cancer Paternal Uncle      unknown   ??? Cancer Paternal Uncle      stomach   ??? Diabetes Paternal Uncle      History     Social History   ??? Marital Status: MARRIED     Spouse  Name: N/A   ??? Number of Children: N/A   ??? Years of Education: N/A     Occupational History   ??? Not on file.     Social History Main Topics   ??? Smoking status: Former Smoker -- 0.50 packs/day for 20 years     Types: Cigarettes     Quit date: 11/30/1984   ??? Smokeless tobacco: Never Used   ??? Alcohol Use: Yes      Comment: 3 glasses of wine "couple of drinks"    ??? Drug Use: No   ??? Sexual Activity:     Partners: Female     Other Topics Concern   ??? Blood Transfusions No     Social History Narrative    Medicare wellness 09/23/13      No Known Allergies  Current Outpatient Prescriptions on File Prior to Visit   Medication Sig Dispense Refill   ??? warfarin (COUMADIN) 5 mg tablet TAKE AS DIRECTED 90 Tab 3   ??? tamsulosin (FLOMAX) 0.4 mg capsule Take 0.4 mg by mouth daily.     ??? lisinopril (PRINIVIL, ZESTRIL) 20 mg tablet TAKE ONE TABLET BY MOUTH ONCE DAILY 90 Tab 3   ??? desonide (DESOWEN) 0.05 % topical ointment Apply  to affected area two (2) times a day. 15 g 2   ??? fexofenadine (ALLEGRA) 180 mg tablet Take  by mouth daily.     ??? multivitamin (ONE A DAY) tablet Take 1 Tab by mouth daily.     ??? warfarin (COUMADIN) 1 mg tablet As directed 180 Tab 2   ??? vardenafil (LEVITRA) 10 mg tablet Take 10 mg by mouth as needed. One hour prior to desired affect 30 Tab 1     No current facility-administered medications on file prior to visit.       Filed Vitals:    09/10/14 0927   BP: 120/74   Pulse: 90   Temp: 98 ??F (36.7 ??C)   TempSrc: Temporal   Resp: 18   Height:  (1.778 m)   Weight: 221 lb (100.245 kg)   SpO2: 99%    .  Body mass index is 31.71 kg/(m^2).            Physical Exam   Nursing note and vitals reviewed.  Constitutional: He is oriented to person, place, and time. He appears well-developed and well-nourished. No distress.   HENT:   Head: Normocephalic and atraumatic.   Right Ear: External ear normal.   Left Ear: External ear normal.   Nose: Nose normal.   Mouth/Throat: Oropharynx is clear and moist. No oropharyngeal exudate.   Eyes: Conjunctivae are normal. Pupils are equal, round, and reactive to light. No scleral icterus.   Neck: Normal range of motion. Neck supple. No JVD present. No tracheal deviation present.   Cardiovascular: Normal rate, regular rhythm, normal heart sounds and intact distal pulses.  Exam reveals no gallop and no friction rub.    No murmur heard.  Pulmonary/Chest: Effort normal and breath sounds normal. No respiratory distress.   Abdominal: Soft. Bowel sounds are normal. He exhibits no distension and no mass. There is no tenderness.   Musculoskeletal: Normal range of motion. He exhibits trace LE edema. Pulses intact and symmetrical.  Pain with palpation of bilateral Sacroiliac joint. Negative straight leg and FABRE     Lymphadenopathy:     He has no cervical adenopathy.   Neurological: He is alert and oriented to person, place, and time.   Skin: Skin  is warm and dry. He has bilateral hemosiderin pigmentation, with no leg hair, and ulcerating rashes on LE being followed by Derm, without evidence of cellulitis.   Erythematous rash on extremities   Psychiatric: He has a normal mood and affect. His behavior is normal.       ASSESSMENT and PLAN    ICD-10-CM ICD-9-CM    1. 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 Numbness and tingling of both legs R20.2 782.0 REFERRAL TO PAIN MANAGEMENT      gabapentin (NEURONTIN) 300 mg capsule      REFERRAL TO PHYSICAL THERAPY      meloxicam (MOBIC) 15 mg tablet   DISCUSSED IN DETAIL THAT RISK OF BLEEDING WITH MOBIC AND COUMADIN. HIGH RISK OF INTERACTION WAS DISCUSSED IN DETAIL.  BENEFITS VS RISKS WERE DISCUSSED IN DETAIL AND PT WISHES TO PRECEDE WITH TREATMENT.     The patient was advised that NSAID-type medications have three very important potential side effects: severe cardiovascular side effects, gastrointestinal irritation including hemorrhage and renal injuries. She was asked to take the medication with food and to stop if she experiences any GI upset. I asked her to call for vomiting, abdominal pain or black/bloody stools. The patient expresses understanding of these issues and questions were answered.    GO TO ER AT ANY SIGN OF BLEEDING CALL 911     Follow-up Disposition:  3 months   lab results and schedule of future lab studies reviewed with patient  reviewed diet, exercise and weight control  cardiovascular risk and specific lipid/LDL goals reviewed  reviewed medications and side effects in detail      I have discussed the above plan with the patient. Patient has also been given instructions and information on his conditions.  Patient is aware of all possible side effects and agrees with the above mentioned plan.       Sent Copy of MRI to urology   Donnella Bi, DO

## 2014-10-08 ENCOUNTER — Encounter: Attending: Family Medicine | Primary: Family Medicine

## 2014-10-23 ENCOUNTER — Ambulatory Visit: Admit: 2014-10-23 | Discharge: 2014-10-23 | Payer: MEDICARE | Attending: Family Medicine | Primary: Family Medicine

## 2014-10-23 DIAGNOSIS — Z0000 Encounter for general adult medical examination without abnormal findings: Secondary | ICD-10-CM

## 2014-10-23 NOTE — Progress Notes (Signed)
This is an Initial Medicare Annual Wellness Exam (AWV) (Performed 12 months after IPPE or effective date of Medicare Part B enrollment, Once in a lifetime)    I have reviewed the patient's medical history in detail and updated the computerized patient record.     History   The patient is an exceedingly pleasant 69 year old White male who presents to the office today for his Annual [employer] Wellness.  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  The patient is due to have fasting labs done and screening PSA.  I have asked them to get them done fasting prior to his next visit.  Otherwise the patient denies any systemic complaints.  Overall he feels he is doing well.  He denies any headache, unexplainable weight loss, fatigue, fever, or chills.  He denies any headaches or blurred vision.  He denies any dysphagia, trouble with hearing, chest pain, heart palpitations, or paroxysmal nocturnal dyspnea.  He denies any pain down his arm, nausea, vomiting, diarrhea, blood in his stool, or black tarry stools.  He denies any melena or hematochezia.  No neurological deficits.  Overall he feels his mood is doing great.      Past Medical History   Diagnosis Date   ??? Hypertension    ??? Arthritis    ??? BPH (benign prostatic hyperplasia)    ??? DVT (deep venous thrombosis) (HCC) x 2 2009  and 2011      after knee surgery then after long car ride    ??? ED (erectile dysfunction)    ??? Thromboembolus (HCC)    ??? Cervical disc disease       Past Surgical History   Procedure Laterality Date   ??? 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 Hx endoscopy       colonoscopy last was in 2008    ??? Hx orthopaedic       Current Outpatient Prescriptions   Medication Sig Dispense Refill   ??? gabapentin (NEURONTIN) 300 mg capsule Take 1 Cap by mouth nightly. 30 Cap 2   ??? meloxicam (MOBIC) 15 mg tablet Take 1 Tab by mouth daily as needed for  Pain. 30 Tab 2   ??? warfarin (COUMADIN) 5 mg tablet TAKE AS DIRECTED 90 Tab 3   ??? warfarin (COUMADIN) 1 mg tablet As directed 180 Tab 2   ??? tamsulosin (FLOMAX) 0.4 mg capsule Take 0.4 mg by mouth daily.     ??? lisinopril (PRINIVIL, ZESTRIL) 20 mg tablet TAKE ONE TABLET BY MOUTH ONCE DAILY 90 Tab 3   ??? desonide (DESOWEN) 0.05 % topical ointment Apply  to affected area two (2) times a day. 15 g 2   ??? fexofenadine (ALLEGRA) 180 mg tablet Take  by mouth daily.     ??? multivitamin (ONE A DAY) tablet Take 1 Tab by mouth daily.     ??? vardenafil (LEVITRA) 10 mg tablet Take 10 mg by mouth as needed. One hour prior to desired affect 30 Tab 1     No Known Allergies  Family History   Problem Relation Age of Onset   ??? Arthritis-osteo Mother    ??? Hypertension Mother    ??? Arthritis-osteo Father    ??? Arthritis-osteo Brother    ??? Cancer Paternal Uncle      unknown   ??? Cancer Paternal Uncle      stomach   ???  Diabetes Paternal Uncle      History   Substance Use Topics   ??? Smoking status: Former Smoker -- 0.50 packs/day for 20 years     Types: Cigarettes     Quit date: 11/30/1984   ??? Smokeless tobacco: Never Used   ??? Alcohol Use: Yes      Comment: 3 glasses of wine "couple of drinks"      Patient Active Problem List   Diagnosis Code   ??? 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 Anticoagulated on Coumadin Z51.81, Z79.01   ??? Dyslipidemia E78.5   ??? Erectile dysfunction N52.9   ??? Incomplete bladder emptying R33.9   ??? Nocturia R35.1         Depression Risk Factor Screening:     PHQ 2 / 9, over the last two weeks 10/23/2014   Little interest or pleasure in doing things Not at all   Feeling down, depressed or hopeless Not at all   Total Score PHQ 2 0     Alcohol Risk Factor Screening:   On any occasion during the past 3 months, have you had more than 4 drinks containing alcohol?  No    Do you average more than 14 drinks per week?  No    Functional Ability and Level of Safety:     Hearing Loss   None      Activities of Daily Living   Self-care.    Requires assistance with: no ADLs    Fall Risk     Fall Risk Assessment, last 12 mths 10/23/2014   Able to walk? Yes   Fall in past 12 months? Yes   Fall with injury? No   Number of falls in past 12 months 1   Fall Risk Score 1     Abuse Screen   Patient is not abused    Review of Systems   A thorough 10 point review of systems was done and was unremarkable except for HPI and chronic medical conditions.         Physical Examination     Visit Vitals   Item Reading   ??? BP 130/82 mmHg   ??? Pulse 99   ??? Temp(Src) 98 ??F (36.7 ??C) (Temporal)   ??? Resp 18   ??? Ht  (1.778 m)   ??? Wt 212 lb (96.163 kg)   ??? BMI 30.42 kg/m2   ??? SpO2 98%       Evaluation of Cognitive Function:  Mood/affect:  happy  Appearance: age appropriate      Nursing note and vitals reviewed.  Constitutional: He is oriented to person, place, and time. He appears well-developed and well-nourished. No distress.   HENT: ??  Head: Normocephalic and atraumatic.   Right Ear: External ear normal.   Left Ear: External ear normal. ??  Nose: Nose normal. ??  Mouth/Throat: Oropharynx is clear and moist. No oropharyngeal exudate.   Eyes: Conjunctivae are normal. Pupils are equal, round, and reactive to light. No scleral icterus.   Neck: Normal range of motion. Neck supple. No JVD present. No tracheal deviation present.   Cardiovascular: Normal rate, regular rhythm, normal heart sounds and intact distal pulses.?? Exam reveals no gallop and no friction rub.?? ??  No murmur heard.  Pulmonary/Chest: Effort normal and breath sounds normal. No respiratory distress.   Abdominal: Soft. Bowel sounds are normal. He exhibits no distension and no mass. There is no tenderness.   Musculoskeletal: Normal range of motion. He exhibits trace  LE edema. Pulses intact and symmetrical.?? Pain with palpation of bilateral Sacroiliac joint. Negative straight leg and FABRE???? ??  Lymphadenopathy:   ?? He has no cervical adenopathy.   Neurological: He is alert and oriented to person, place, and time.    Skin: Skin is warm and dry. He has bilateral hemosiderin pigmentation, with no leg hair, and ulcerating rashes on LE being followed by Derm, without evidence of cellulitis.   Erythematous rash on extremities   Psychiatric: He has a normal mood and affect. His behavior is normal.         Patient Care Team:  Donnella Bi, DO as PCP - General (Family Practice)    Advice/Referrals/Counseling   Education and counseling provided:  Are appropriate based on today's review and evaluation    Assessment/Plan       ICD-10-CM ICD-9-CM    1. Routine general medical examination at a health care facility Z00.00 V70.0    2. Screening for alcoholism Z13.89 V79.1    3. Screening for depression Z13.89 V79.0    4. Screening PSA (prostate specific antigen) Z12.5 V76.44 PSA SCREENING (SCREENING)   5. Seasonal allergic rhinitis J30.2 477.9 CBC WITH AUTOMATED DIFF      METABOLIC PANEL, COMPREHENSIVE      THYROID PANEL W/TSH   6. Dyslipidemia E78.5 272.4 CBC WITH AUTOMATED DIFF      METABOLIC PANEL, COMPREHENSIVE      LIPID PANEL      THYROID PANEL W/TSH   7. Anticoagulated on Coumadin Z51.81 V58.83 CBC WITH AUTOMATED DIFF    Z79.01 V58.61 METABOLIC PANEL, COMPREHENSIVE      THYROID PANEL W/TSH     Follow-up Disposition:  1 year   lab results and schedule of future lab studies reviewed with patient  reviewed diet, exercise and weight control  very strongly urged to quit smoking to reduce cardiovascular risk  cardiovascular risk and specific lipid/LDL goals reviewed  reviewed medications and side effects in detail  use of aspirin to prevent MI and TIA's discussed  radiology results and schedule of future radiology studies reviewed with patient.    I have discussed the above plan with the patient. Patient has also been given instructions and information on his conditions.  Patient is aware of all possible side effects and agrees with the above mentioned plan.       Donnella Bi, DO

## 2014-10-23 NOTE — Patient Instructions (Addendum)
Eating Healthy Foods: Care Instructions  Your Care Instructions  Eating healthy foods can help lower your risk for disease. Healthy food gives you energy and keeps your heart strong, your brain active, your muscles working, and your bones strong.  A healthy diet includes a variety of foods from the basic food groups: grains, vegetables, fruits, milk and milk products, and meat and beans. Some people may eat more of their favorite foods from only one food group and, as a result, miss getting the nutrients they need. So, it is important to pay attention not only to what you eat but also to what you are missing from your diet. You can eat a healthy, balanced diet by making a few small changes.  Follow-up care is a key part of your treatment and safety. Be sure to make and go to all appointments, and call your doctor if you are having problems. It???s also a good idea to know your test results and keep a list of the medicines you take.  How can you care for yourself at home?  Look at what you eat  ?? Keep a food diary for a week or two and record everything you eat or drink. Track the number of servings you eat from each food group.  ?? For a balanced diet every day, eat a variety of:  ?? 6 or more ounce-equivalents of grains, such as cereals, breads, crackers, rice, or pasta, every day. An ounce-equivalent is 1 slice of bread, 1 cup of ready-to-eat cereal, or ?? cup of cooked rice, cooked pasta, or cooked cereal.  ?? 2?? cups of vegetables, especially:  ?? Dark-green vegetables such as broccoli and spinach.  ?? Orange vegetables such as carrots and sweet potatoes.  ?? Dry beans (such as pinto and kidney beans) and peas (such as lentils).  ?? 2 cups of fresh, frozen, or canned fruit. A small apple or 1 banana or orange equals 1 cup.  ?? 3 cups of nonfat or low-fat milk, yogurt, or other milk products.  ?? 5?? ounces of meat and beans, such as chicken, fish, lean meat, beans,  nuts, and seeds. One egg, 1 tablespoon of peanut butter, ?? ounce nuts or seeds, or ?? cup of cooked beans equals 1 ounce of meat.  ?? Learn how to read food labels for serving sizes and ingredients. Fast-food and convenience-food meals often contain few or no fruits or vegetables. Make sure you eat some fruits and vegetables to make the meal more nutritious.  ?? Look at your food diary. For each food group, add up what you have eaten and then divide the total by the number of days. This will give you an idea of how much you are eating from each food group. See if you can find some ways to change your diet to make it more healthy.  Start small  ?? Do not try to make dramatic changes to your diet all at once. You might feel that you are missing out on your favorite foods and then be more likely to fail.  ?? Start slowly, and gradually change your habits. Try some of the following:  ?? Use whole wheat bread instead of white bread.  ?? Use nonfat or low-fat milk instead of whole milk.  ?? Eat brown rice instead of white rice, and eat whole wheat pasta instead of white-flour pasta.  ?? Try low-fat cheeses and low-fat yogurt.  ?? Add more fruits and vegetables to meals and have them for snacks.  ??   Add lettuce, tomato, cucumber, and onion to sandwiches.  ?? Add fruit to yogurt and cereal.  Enjoy food  ?? You can still eat your favorite foods. You just may need to eat less of them. If your favorite foods are high in fat, salt, and sugar, limit how often you eat them, but do not cut them out entirely.  ?? Eat a wide variety of foods.  Make healthy choices when eating out  ?? The type of restaurant you choose can help you make healthy choices. Even fast-food chains are now offering more low-fat or healthier choices on the menu.  ?? Choose smaller portions, or take half of your meal home.  ?? When eating out, try:  ?? A veggie pizza with a whole wheat crust or grilled chicken (instead of sausage or pepperoni).   ?? Pasta with roasted vegetables, grilled chicken, or marinara sauce instead of cream sauce.  ?? A vegetable wrap or grilled chicken wrap.  ?? Broiled or poached food instead of fried or breaded items.  Make healthy choices easy  ?? Buy packaged, prewashed, ready-to-eat fresh vegetables and fruits, such as baby carrots, salad mixes, and chopped or shredded broccoli and cauliflower.  ?? Buy packaged, presliced fruits, such as melon or pineapple.  ?? Choose 100% fruit or vegetable juice instead of soda. Limit juice intake to 4 to 6 oz (?? to ?? cup) a day.  ?? Blend low-fat yogurt, fruit juice, and canned or frozen fruit to make a smoothie for breakfast or a snack.   Where can you learn more?   Go to Metropolitan[employer].huhttp://www.healthwise.net/[facility]  Enter T756 in the search box to learn more about "Eating Healthy Foods: Care Instructions."   ?? 2006-2016 Healthwise, Incorporated. Care instructions adapted under license by [employer] (which disclaims liability or warranty for this information). This care instruction is for use with your licensed healthcare professional. If you have questions about a medical condition or this instruction, always ask your healthcare professional. Healthwise, Incorporated disclaims any warranty or liability for your use of this information.  Content Version: 10.8.513193; Current as of: May 16, 2014       The following medical conditions and instructions for your medical care was discussed with DR.Idania Desouza during your appointment today:     Flonase and Claritin or Zyrtec (generic)

## 2014-10-27 NOTE — Addendum Note (Signed)
Addended by: Donnella BiMILLER, Smitty Ackerley E on: 10/27/2014 10:44 AM      Modules accepted: Level of Service

## 2014-11-12 MED ORDER — TAMSULOSIN SR 0.4 MG 24 HR CAP
0.4 mg | ORAL_CAPSULE | Freq: Every day | ORAL | Status: DC
Start: 2014-11-12 — End: 2015-11-25

## 2014-11-12 NOTE — Telephone Encounter (Signed)
Needs refill on Tamsulosin #90 called into [facility] [place].  Yearly June 17

## 2014-11-12 NOTE — Telephone Encounter (Signed)
Rx has been sent.

## 2014-12-09 ENCOUNTER — Inpatient Hospital Stay: Admit: 2014-12-09 | Payer: MEDICARE | Primary: Family Medicine

## 2014-12-09 ENCOUNTER — Ambulatory Visit: Admit: 2014-12-09 | Discharge: 2014-12-09 | Payer: MEDICARE | Attending: Urology | Primary: Family Medicine

## 2014-12-09 DIAGNOSIS — N401 Enlarged prostate with lower urinary tract symptoms: Secondary | ICD-10-CM | POA: Insufficient documentation

## 2014-12-09 DIAGNOSIS — R339 Retention of urine, unspecified: Secondary | ICD-10-CM

## 2014-12-09 DIAGNOSIS — Z125 Encounter for screening for malignant neoplasm of prostate: Secondary | ICD-10-CM

## 2014-12-09 LAB — AMB POC CREATININE ASSAY, OTHER SOURCE
Creatinine, other source: 100
Source: NORMAL

## 2014-12-09 LAB — AMB POC URINALYSIS DIP STICK AUTO W/O MICRO
Blood (UA POC): NEGATIVE
Glucose (UA POC): NEGATIVE
Ketones (UA POC): NEGATIVE
Leukocyte esterase (UA POC): NEGATIVE
Nitrites (UA POC): NEGATIVE
Protein (UA POC): NEGATIVE mg/dL
Specific gravity (UA POC): 1.02 (ref 1.001–1.035)
pH (UA POC): 6 (ref 4.6–8.0)

## 2014-12-09 NOTE — Patient Instructions (Addendum)
[facility]     Office working hours are 8:00am-4:30pm    Office phone number is [phone] and the fax line is [phone]  For non-emergent medical care and clinical advice during office hours  1. Call the office or  2. Send a message to office staff via mychart  Emergency care can be obtained at the [facility] ER, Urgent Care or 911.    Patient Satisfaction Survey   We appreciate you sharing your e-mail address with us. Please watch for our patient  satisfaction survey which you will receive by e-mail. We strive to provide you with the   best care possible. We respect all comments and will take them into consideration to improve our service.  Thank you for your participation.      Benign Prostatic Hyperplasia: Care Instructions  Your Care Instructions     Benign prostatic hyperplasia, or BPH, is an enlarged prostate gland. The prostate is a small gland that makes some of the fluid in semen. Prostate enlargement happens to almost all men as they age. It is usually not serious. BPH does not cause prostate cancer.  As the prostate gets bigger, it may partly block the flow of urine. You may have a hard time getting a urine stream started or completely stopped. BPH can cause dribbling. You may have a weak urine stream, or you may have to urinate more often than you used to, especially at night. Most men find these problems easy to manage.  You do not need treatment unless your symptoms bother you a lot or you have other problems, such as bladder infections or stones. In these cases, medicines may help. Surgery is not needed unless the urine flow is blocked or the symptoms do not get better with medicine.  Follow-up care is a key part of your treatment and safety. Be sure to make and go to all appointments, and call your doctor if you are having problems. It's also a good idea to know your test results and keep a list of the medicines you take.  How can you care for yourself at home?   ?? Take plenty of time to urinate. Try to relax.  ?? Try "double voiding." Urinate as much you can, relax for a few moments, and then try to urinate again.  ?? Sit on the toilet to urinate.  ?? Read or think of other things while you are waiting.  ?? Turn on a faucet, or try to picture running water. Some men find that this helps get their urine flowing.  ?? If dribbling is a problem, wash your penis daily to avoid skin irritation and infection.  ?? Avoid caffeine and alcohol. These drinks will increase how often you need to urinate. Spread your fluid intake throughout the day. If the urge to urinate often wakes you at night, limit your fluid intake in the evening. Urinate right before you go to bed.  ?? Many over-the-counter cold and allergy medicines can make the symptoms of BPH worse. Avoid antihistamines, decongestants, and allergy pills, if you can. Read the warnings on the package.  ?? If you take any prescription medicines, especially tranquilizers or antidepressants, ask your doctor or pharmacist whether they can cause urination problems. There may be other medicines you can use that do not cause urinary problems.  ?? Be safe with medicines. Take your medicines exactly as prescribed. Call your doctor if you think you are having a problem with your medicine.  When should you call for   help?  Call your doctor now or seek immediate medical care if:  ?? You cannot urinate at all.  ?? You have symptoms of a urinary infection. For example:  ?? You have blood or pus in your urine.  ?? You have pain in your back just below your rib cage. This is called flank pain.  ?? You have a fever, chills, or body aches.  ?? It hurts to urinate.  ?? You have groin or belly pain.  Watch closely for changes in your health, and be sure to contact your doctor if:  ?? It hurts when you ejaculate.  ?? Your urinary problems get a lot worse or bother you a lot.  Where can you learn more?  Go to http://www.healthwise.net/[facility]   Enter C033 in the search box to learn more about "Benign Prostatic Hyperplasia: Care Instructions."  ?? 2006-2016 Healthwise, Inc[employer]. Care instructions adapted under license by [employer] (which disclaims liability or warranty for this information). This care instruction is for use with your licensed healthcare professional. If you have questions about a medical condition or this instruction, always ask your healthcare professional. Healthwise, Incorporated disclaims any warranty or liability for your use of this information.  Content Version: 10.9.538570; Current as of: Nov 15, 2013

## 2014-12-09 NOTE — Progress Notes (Signed)
Successful venipuncture performed on patient. 1-SST tube drawn from RAC for PSA. Pt tolerated well, without complaint of pain or discomfort.

## 2014-12-09 NOTE — Progress Notes (Signed)
Male Progress Note      Patient: Julian Bailey               Sex: male             MRN: [id number]      Date of Birth:  1946-06-11      Age:  69 y.o.               Subjective:     Julian Bailey is a 69 y.o. male who is being seen for follow-up for BPH with LUTS, incomplete bladder emptying, ED, and yearly exam.  He takes tamsulosin 0.4 mg daily for BPH.  He doesn't need refills.  The patient has been doing well since his last visit.  He feels that he is urinating fine.  He does feel that his bladder is emptying completely.  He isn't having any issues with urination.  He rare nocturia.  His frequency and urgency is improved.  He drinks 2-3 bottles of water and some coffee in the morning.      The patient continues to take Levitra 10 mg PRN for sexual activity with success.  He has no side effects from this medication.     25 minutes were spent in face-to-face time with the patient at his visit today, >50% of which were spent in counseling in treatment options and outcomes, health maintenance, and answering all questions.     Past Medical History   Diagnosis Date   ??? Hypertension    ??? Arthritis    ??? BPH (benign prostatic hyperplasia)    ??? DVT (deep venous thrombosis) (HCC) x 2 2009  and 2011      after knee surgery then after long car ride    ??? ED (erectile dysfunction)    ??? Thromboembolus (HCC)    ??? Cervical disc disease        Past Surgical History   Procedure Laterality Date   ??? 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 Hx endoscopy       colonoscopy last was in 2008    ??? Hx orthopaedic         Family History   Problem Relation Age of Onset   ??? Arthritis-osteo Mother    ??? Hypertension Mother    ??? Arthritis-osteo Father    ??? Arthritis-osteo Brother    ??? Cancer Paternal Uncle      unknown   ??? Cancer Paternal Uncle      stomach   ??? Diabetes Paternal Uncle        History     Social History   ??? Marital Status: MARRIED     Spouse Name: N/A   ??? Number of Children: N/A   ??? Years of Education: N/A      Social History Main Topics   ??? Smoking status: Former Smoker -- 0.50 packs/day for 20 years     Types: Cigarettes     Quit date: 11/30/1984   ??? Smokeless tobacco: Never Used   ??? Alcohol Use: Yes      Comment: 3 glasses of wine "couple of drinks"    ??? Drug Use: No   ??? Sexual Activity:     Partners: Female     Other Topics Concern   ??? Blood Transfusions No     Social History Narrative    Medicare wellness 09/23/13        Prior to Admission medications  Medication Sig Start Date End Date Taking? Authorizing Provider   tamsulosin (FLOMAX) 0.4 mg capsule Take 1 Cap by mouth daily. 11/12/14  Yes Kaylani Fromme P Pegi Milazzo, DO   gabapentin (NEURONTIN) 300 mg capsule Take 1 Cap by mouth nightly. 09/10/14  Yes Lauren Samara Deist, DO   meloxicam (MOBIC) 15 mg tablet Take 1 Tab by mouth daily as needed for Pain. 09/10/14  Yes Donnella Bi, DO   warfarin (COUMADIN) 5 mg tablet TAKE AS DIRECTED 06/16/14  Yes Donnella Bi, DO   warfarin (COUMADIN) 1 mg tablet As directed 06/16/14  Yes Lauren Samara Deist, DO   lisinopril (PRINIVIL, ZESTRIL) 20 mg tablet TAKE ONE TABLET BY MOUTH ONCE DAILY 06/06/14  Yes Lauren Samara Deist, DO   desonide (DESOWEN) 0.05 % topical ointment Apply  to affected area two (2) times a day. 01/23/14  Yes Donnella Bi, DO   vardenafil (LEVITRA) 10 mg tablet Take 10 mg by mouth as needed. One hour prior to desired affect 12/20/11  Yes Donnella Bi, DO   multivitamin (ONE A DAY) tablet Take 1 Tab by mouth daily.   Yes Historical Provider   fexofenadine (ALLEGRA) 180 mg tablet Take  by mouth daily.    Historical Provider       No Known Allergies    Immunization History   Administered Date(s) Administered   ??? Influenza Vaccine 03/26/2014   ??? Tdap 05/13/2013         Review of Systems:  General:  Intentional weight loss  Head:   allergies  Eyes:   negative  Respiratory:  Patient denies cough, chest pain, dyspnea, wheezing or hemoptysis  Cardiac:  negative  Gatrointestinal: negative  Urinary:  BPH  Musculoskeletal: back pain   Endocrine:  Denies: polydipsia/polyuria  Hematological: On coumadin  Psychological: negative  Skin:   Denies: hives      Objective:      Visit Vitals   Item Reading   ??? BP 144/88 mmHg   ??? Pulse 82   ??? Temp(Src) 98.2 ??F (36.8 ??C) (Oral)   ??? Ht 5\' 10"  (1.778 m)   ??? Wt 215 lb (97.523 kg)   ??? BMI 30.85 kg/m2         Labs:  Urinalysis:    Results for orders placed or performed in visit on 12/09/14   AMB POC URINALYSIS DIP STICK AUTO W/O MICRO     Status: None   Result Value Ref Range Status    Color (UA POC) Yellow  Final    Clarity (UA POC) Clear  Final    Glucose (UA POC) Negative Negative Final    Bilirubin (UA POC)  Negative     Ketones (UA POC) Negative Negative Final    Specific gravity (UA POC) 1.020 1.001 - 1.035 Final    Blood (UA POC) Negative Negative Final    pH (UA POC) 6.0 4.6 - 8.0 Final    Protein (UA POC) Negative Negative mg/dL Final    Urobilinogen (UA POC)  0.2 - 1     Nitrites (UA POC) Negative Negative Final    Leukocyte esterase (UA POC) Negative Negative Final         Lab Results   Component Value Date/Time    PROSTATE SPECIFIC AG 2.2 09/23/2013 09:35 AM    PROSTATE SPECIFIC AG 3.8 02/29/2012 08:55 AM        Imaging:   None reviewed.    PVR:  145 mL    SHIM Score:  24 (No ED)    AUA/QOL score:  9/1      Physical Exam:   Constitutional: Normal appearing patient with well groomed; No deformities; Body habitus appears normal; Nutritionally sound.   Neck: Supple; No JVD; No cervical lymphadenopathy; No carotid bruits; No masses; trachea midline; No thyromegaly  Respiratory: No use of accessory muscles; No intercostal retractions; Clear to auscultation bilaterally  Heart: Regular rate and rhythm; No murmurs, rubs, gallops  GI: soft, non tender, normal bowel sounds, no palpable masses, no organomegaly, obese    GU:   Anus and perineum without any focal skin defects     Penis is circumcised without plaques, masses, scaring, or deformity     Urethral meatus is normal in size and location; No urethral lesions, discharge, or masses    Testis are descended bilaterally within the scrotum; Testis are symmetric and normal size; No masses.    Epididymides are noted bilaterally; They are normal in size and symmetry; No cyst or masses     Scrotum is without any lesions, cysts, or rashes    No inguinal lymphadenopathy or inguinal hernia.    Digital Rectal Exam:   Prostate is 2+ (50gms); Rectal sphincter tone is good: No hemorrhoids present; No rectal masses    Seminal Vesicles are normal in size, symmetry without tenderness, mass, or enlargement.    Musculoskeletal:   No CVA tenderness; Good sensation, ROM, and strength bilaterally of the Upper and Lower extremities.    Assessment/Plan     1. Weak stream, improved - continue tamsulosin 0.4 mg daily.?? No refills are needed.  2. Nocturia, resolved - Decrease fluids 3 hours prior to bedtime.  3. ED - Continue Levitra 10 mg and PRN. No refills needed.  4. Frequency, improved - Patient was advised to increase water intake to at least 64 oz daily and decrease coffee, colas, teas, etc.  5. Urgency, improved - Patient was advised to increase water intake to at least 64 oz daily and decrease coffee, colas, teas, etc. ??  6. Incomplete bladder emptying - continue tamsulosin 0.4 mg daily. Possible side effects of this medication were discussed with the patient to include orthostasis and retrograde ejaculation. The patient was advised to take this medication at least 2 hours apart from the PDE5 inhibitors.   7. BPH with LUTS - continue tamsulosin 0.4 mg daily. ??  8. Screening PSA - Check today.  9. I will see back in 6 months to recheck PVR.

## 2014-12-10 ENCOUNTER — Encounter: Attending: Urology | Primary: Family Medicine

## 2014-12-10 LAB — PSA SCREENING (SCREENING): Prostate Specific Ag: 2.9 ng/mL (ref 0.0–4.0)

## 2014-12-12 ENCOUNTER — Encounter: Attending: Urology | Primary: Family Medicine

## 2015-02-04 NOTE — Telephone Encounter (Signed)
[facility]

## 2015-02-13 NOTE — Telephone Encounter (Signed)
Patients INR for today 02/13/15 is 2.2

## 2015-03-30 ENCOUNTER — Ambulatory Visit: Admit: 2015-03-30 | Discharge: 2015-03-30 | Payer: MEDICARE | Attending: Family Medicine | Primary: Family Medicine

## 2015-03-30 DIAGNOSIS — E785 Hyperlipidemia, unspecified: Secondary | ICD-10-CM

## 2015-03-30 MED ORDER — PNEUMOCOCCAL 13-VAL CONJ VACCINE-DIP CRM (PF) 0.5 ML IM SYRINGE
0.5 mL | Freq: Once | INTRAMUSCULAR | 0 refills | Status: AC
Start: 2015-03-30 — End: 2015-03-30

## 2015-03-30 NOTE — Patient Instructions (Addendum)
[facility]  Office working hours are Monday - Friday 8:00AM-5:00PM.  Office phone number is [phone] and fax is [phone].  For non-emergent medical care and clinical advice during office hours:   1. Call office or   2.  Send message or request using MyChart  For non-emergent medical care and clinical advice after office hours:  1.  Send message or request using MyChart   2.  Call [phone] and leave a message with our answering service or    Emergency care can be obtained at the [facility], Urgent Care or by calling 911.    Patient Satisfaction Survey  As a valued patient, you will be receiving a survey from [employer].  We encourage you to share your thoughts and opinions about the care you received today. Thank you for choosing [facility]           BRING ALL MEDICATION BOTTLES TO EVERY APPOINTMENT    Medication Refills -   * Please do not let your medication run out before calling for a refill, the best practice would be to call when you have 2 weeks of medication left.  * Please allow 48 hours for medications to be called into your pharmacy when you request a medication refill      Have MyChart? Use it to request medication refills, it's quick and easy! If you have questions about MyChart please feel free to ask our staff!    Preventing Falls: Care Instructions  Your Care Instructions  Getting around your home safely can be a challenge if you have injuries or health problems that make it easy for you to fall. Loose rugs and furniture in walkways are among the dangers for many older people who have problems walking or who have poor eyesight. People who have conditions such as arthritis, osteoporosis, or dementia also have to be careful not to fall.  You can make your home safer with a few simple measures.  Follow-up care is a key part of your treatment and safety. Be sure to make and go to all appointments, and call your doctor if you are having  problems. It's also a good idea to know your test results and keep a list of the medicines you take.  How can you care for yourself at home?  Taking care of yourself  ?? You may get dizzy if you do not drink enough water. To prevent dehydration, drink plenty of fluids, enough so that your urine is light yellow or clear like water. Choose water and other caffeine-free clear liquids. If you have kidney, heart, or liver disease and have to limit fluids, talk with your doctor before you increase the amount of fluids you drink.  ?? Exercise regularly to improve your strength, muscle tone, and balance. Walk if you can. Swimming may be a good choice if you cannot walk easily.  ?? Have your vision and hearing checked each year or any time you notice a change. If you have trouble seeing and hearing, you might not be able to avoid objects and could lose your balance.  ?? Know the side effects of the medicines you take. Ask your doctor or pharmacist whether the medicines you take can affect your balance. Sleeping pills or sedatives can affect your balance.  ?? Limit the amount of alcohol you drink. Alcohol can impair your balance and other senses.  ?? Ask your doctor whether calluses or corns on your feet need to be removed. If   you wear loose-fitting shoes because of calluses or corns, you can lose your balance and fall.  ?? Talk to your doctor if you have numbness in your feet.  Preventing falls at home  ?? Remove raised doorway thresholds, throw rugs, and clutter. Repair loose carpet or raised areas in the floor.  ?? Move furniture and electrical cords to keep them out of walking paths.  ?? Use nonskid floor wax, and wipe up spills right away, especially on ceramic tile floors.  ?? If you use a walker or cane, put rubber tips on it. If you use crutches, clean the bottoms of them regularly with an abrasive pad, such as steel wool.  ?? Keep your house well lit, especially stairways, porches, and outside  walkways. Use night-lights in areas such as hallways and bathrooms. Add extra light switches or use remote switches (such as switches that go on or off when you clap your hands) to make it easier to turn lights on if you have to get up during the night.  ?? Install sturdy handrails on stairways.  ?? Move items in your cabinets so that the things you use a lot are on the lower shelves (about waist level).  ?? Keep a cordless phone and a flashlight with new batteries by your bed. If possible, put a phone in each of the main rooms of your house, or carry a cell phone in case you fall and cannot reach a phone. Or, you can wear a device around your neck or wrist. You push a button that sends a signal for help.  ?? Wear low-heeled shoes that fit well and give your feet good support. Use footwear with nonskid soles. Check the heels and soles of your shoes for wear. Repair or replace worn heels or soles.  ?? Do not wear socks without shoes on wood floors.  ?? Walk on the grass when the sidewalks are slippery. If you live in an area that gets snow and ice in the winter, sprinkle salt on slippery steps and sidewalks.  Preventing falls in the bath  ?? Install grab bars and nonskid mats inside and outside your shower or tub and near the toilet and sinks.  ?? Use shower chairs and bath benches.  ?? Use a hand-held shower head that will allow you to sit while showering.  ?? Get into a tub or shower by putting the weaker leg in first. Get out of a tub or shower with your strong side first.  ?? Repair loose toilet seats and consider installing a raised toilet seat to make getting on and off the toilet easier.  ?? Keep your bathroom door unlocked while you are in the shower.  Where can you learn more?  Go to http://www.healthwise.net/GoodHelpConnections  Enter G117 in the search box to learn more about "Preventing Falls: Care Instructions."  ?? 2006-2016 [employer]. Care instructions adapted under  license by [employer] (which disclaims liability or warranty for this information). This care instruction is for use with your licensed healthcare professional. If you have questions about a medical condition or this instruction, always ask your healthcare professional. Healthwise, Incorporated disclaims any warranty or liability for your use of this information.  Content Version: 11.0.578772; Current as of: May 16, 2014        Preventing Outdoor Falls: Care Instructions  Your Care Instructions  Worries about falls don't need to keep you indoors. Outdoor activities like walking have big benefits for your health. You will need to   watch your step and learn a few safety measures.  If you are worried about having a fall outdoors, ask your doctor about exercises, classes, or physical therapy that may help. You can learn ways to gain strength, flexibility, and balance. Ask if it might help to use a cane or walker.  You can make your time outdoors safer with a few simple measures.  Follow-up care is a key part of your treatment and safety. Be sure to make and go to all appointments, and call your doctor if you are having problems. It's also a good idea to know your test results and keep a list of the medicines you take.  How can you prevent falls outdoors?  ?? Wear shoes with firm soles and low heels. If you have to walk on an icy surface, use grippers that can be worn over your shoes in bad weather.  ?? Be extra careful if weather is bad. Walk on the grass when the sidewalks are slick. If you live in a place that gets snow and ice in the winter, sprinkle salt on slippery stairs and sidewalks.  ?? Be careful getting on or off buses and trains or getting in and out of cars. If handrails are available, use them.  ?? Be careful when you cross the street. Look for crosswalks or places where curb cuts or ramps are present.  ?? Try not to hurry, especially if you are carrying something.   ?? Be cautious in parking lots or garages. There may be curbs or changes in pavement, or the height of the pavement may vary.  ?? Make sure to wear the correct eyeglasses, if you need them. Reading glasses or bifocals can make it harder to see hazards that might be in your way.  ?? If you are walking outdoors for exercise, try to:  ?? Walk in well-lighted, well-maintained areas. These include high school or college tracks, shopping malls, and public spaces.  ?? Walk with a partner.  ?? Watch out for cracked sidewalks, curbs, changes in the height of the pavement, exposed tree roots, and debris such as fallen leaves or branches.  Where can you learn more?  Go to http://www.healthwise.net/GoodHelpConnections  Enter U018 in the search box to learn more about "Preventing Outdoor Falls: Care Instructions."  ?? 2006-2016 Health[employer]. Care instructions adapted under license by [employer] (which disclaims liability or warranty for this information). This care instruction is for use with your licensed healthcare professional. If you have questions about a medical condition or this instruction, always ask your healthcare professional. Healthwise, Incorporated disclaims any warranty or liability for your use of this information.  Content Version: 11.0.578772; Current as of: July 28, 2014        How to Get Up Safely After a Fall: Care Instructions  Your Care Instructions  If you have injuries, health problems, or other reasons that may make it easy for you to fall at home, it is a good idea to learn how to get up safely after a fall. Learning how to get up correctly can help you avoid making an injury worse.  Also, knowing what to do if you cannot get up can help you stay safe until help arrives.  Follow-up care is a key part of your treatment and safety. Be sure to make and go to all appointments, and call your doctor if you are having  problems. It's also a good idea to know your test results and keep a list of   the medicines you take.  How can you care for yourself after a fall?  If you think you can get up  First lie still for a few minutes and think about how you feel. If your body feels okay and you think you can get up safely, follow the rest of the steps below:  1. Look for a chair or other piece of furniture that is close to you.  2. Roll onto your side and rest. Roll by turning your head in the direction you want to roll, move your shoulder and arm, then hip and leg in the same direction.  3. Lie still for a moment to let your blood pressure adjust.  4. Slowly push your upper body up, lift your head, and take a moment to rest.  5. Slowly get up on your hands and knees, and crawl to the chair or other stable piece of furniture.  6. Put your hands on the chair.  7. Move one foot forward, and place it flat on the floor. Your other leg should be bent with the knee on the floor.  8. Rise slowly, turn your body, and sit in the chair. Stay seated for a bit and think about how you feel. Call for help. Even if you feel okay, let someone know what happened to you. You might not know that you have a serious injury.  If you cannot get up  1. If you think you are injured after a fall or you cannot get up, try not to panic.  2. Call out for help.  3. If you have a phone within reach or you have an emergency call device, use it to call for help.  4. If you do not have a phone within reach, try to slide yourself toward it. If you cannot get to the phone, try to slide toward a door or window or a place where you think you can be heard.  5. Yell or use an object to make noise so someone might hear you.  6. If you can reach something that you can use for a pillow, place it under your head. Try to stay warm by covering yourself with a blanket or clothing while you wait for help.  When should you call for help?   Call 911 anytime you think you may need emergency care. For example, call if:  ?? You passed out (lost consciousness).  ?? You cannot get up after a fall.  ?? You believe you have serious or life-threatening injuries.  Call your doctor now or seek immediate medical care if:  ?? You have severe pain.  ?? You think you may have passed out but are not sure.  ?? You hit your head or think you may have hit your head but are not sure.  ?? You think your medicines may have caused you to fall.  Watch closely for changes in your health, and be sure to contact your doctor if:  ?? You have fallen, even if you think you are not hurt. Do not feel embarrassed to let your doctor know you have fallen. Your doctor may be able to adjust your medicines or provide other help so you can prevent future falls.  Where can you learn more?  Go to http://www.healthwise.net/GoodHelpConnections  Enter G513 in the search box to learn more about "How to Get Up Safely After a Fall: Care Instructions."  ?? 2006-2016 Healthwise, [employer]. Care instructions adapted under license by [employer] (which disclaims   liability or warranty for this information). This care instruction is for use with your licensed healthcare professional. If you have questions about a medical condition or this instruction, always ask your healthcare professional. Healthwise, Incorporated disclaims any warranty or liability for your use of this information.  Content Version: 11.0.578772; Current as of: May 16, 2014        Learning About Dietary Guidelines  What are the Dietary Guidelines for Americans?     Dietary Guidelines for Americans provide tips for eating well and staying healthy. This helps reduce the risk for long-term (chronic) diseases.  These adult guidelines from the [place] government recommend that you:  ?? Eat lots of fruits, vegetables, whole grains, and low-fat or nonfat dairy products.   ?? Try to balance your eating with your activity. This helps you stay at a healthy weight.  ?? Drink alcohol in moderation, if at all.  ?? Limit foods high in salt, saturated fat, trans fat, and added sugar.  What is MyPlate?  MyPlate is the [place] government's food guide. It can help you make your own well-balanced eating plan. A balanced eating plan means that you eat enough, but not too much, and that your food gives you the nutrients you need to stay healthy.  MyPlate focuses on eating plenty of whole grains, fruits, and vegetables, and on limiting fat and sugar. It is available online at www.https://www.bernard.org/.  How can you get started?  MyPlate suggests that most adults eat certain amounts from the different food groups:  Grains  Eat 5 to 8 ounces of grains each day. Half of those should be whole grains. Choose whole-grain breads, cold and cooked cereals and grains, pasta (without creamy sauces), hard rolls, or low-fat or fat-free crackers.  Vegetables  Eat 2 to 3 cups of vegetables every day. They contain little if any fat. And they have lots of nutrients that help protect against heart disease.  Fruits  Eat 1?? to 2 cups of fruits every day. Fruits contain very little fat but lots of nutrients.  Protein foods  Most adults need 5 to 6?? ounces each day. Choose fish and lean poultry more often. Eat red meat and fried meats less often. Dried beans, tofu, and nuts are also good sources of protein.  Dairy  Most adults need 3 cups of milk and milk products a day. Choose low-fat or fat-free products from this food group. If you have problems digesting milk, try eating cheese or yogurt instead.  Limit fats and oils, including those used in cooking. When you do use fats, choose oils that are liquid at room temperature (unsaturated fats). These include canola oil and olive oil. Avoid foods with trans fats, such as many fried foods, cookies, and snack foods.  Where can you learn more?   Go to [employer]  Enter [phone] in the search box to learn more about "Learning About Dietary Guidelines."  ?? 2006-2016 Healthwise, [employer]. Care instructions adapted under license by [employer] (which disclaims liability or warranty for this information). This care instruction is for use with your licensed healthcare professional. If you have questions about a medical condition or this instruction, always ask your healthcare professional. Healthwise, Incorporated disclaims any warranty or liability for your use of this information.  Content Version: 11.0.578772; Current as of: August 01, 2014        Knee: Exercises  Your Care Instructions  Here are some examples of exercises for your knee. Start each exercise slowly. Ease off the  exercise if you start to have pain.  Your doctor or physical therapist will tell you when you can start these exercises and which ones will work best for you.  How to do the exercises  Quad sets    9. Sit with your leg straight and supported on the floor or a firm bed. (If you feel discomfort in the front or back of your knee, place a small towel roll under your knee.)  10. Tighten the muscles on top of your thigh by pressing the back of your knee flat down to the floor. (If you feel discomfort under your kneecap, place a small towel roll under your knee.)  11. Hold for about 6 seconds, then rest for up to 10 seconds.  12. Do 8 to 12 repetitions several times a day.  Straight-leg raises to the front    7. Lie on your back with your good knee bent so that your foot rests flat on the floor. Your injured leg should be straight. Make sure that your low back has a normal curve. You should be able to slip your flat hand in between the floor and the small of your back, with your palm touching the floor and your back touching the back of your hand.  8. Tighten the thigh muscles in the injured leg by pressing the back of  your knee flat down to the floor. Hold your knee straight.  9. Keeping the thigh muscles tight, lift your injured leg up so that your heel is about 12 inches off the floor. Hold for about 6 seconds and then lower slowly.  10. Do 8 to 12 repetitions, 3 times a day.  Straight-leg raises to the outside    1. Lie on your side, with your injured leg on top.  2. Tighten the front thigh muscles of your injured leg to keep your knee straight.  3. Keep your hip and your leg straight in line with the rest of your body, and keep your knee pointing forward. Do not drop your hip back.  4. Lift your injured leg straight up toward the ceiling, about 12 inches off the floor. Hold for about 6 seconds, then slowly lower your leg.  5. Do 8 to 12 repetitions.  Straight-leg raises to the back    1. Lie on your stomach, and lift your leg straight up behind you (toward the ceiling).  2. Lift your toes about 6 inches off the floor, hold for about 6 seconds, then lower slowly.  3. Do 8 to 12 repetitions.  Straight-leg raises to the inside    1. Lie on the side of your body with the injured leg.  2. You can either prop your other (good) leg up on a chair, or you can bend your good knee and put that foot in front of your injured knee. Do not drop your hip back.  3. Tighten the muscles on the front of your thigh to straighten your injured knee.  4. Keep your kneecap pointing forward, and lift your whole leg up toward the ceiling about 6 inches. Hold for about 6 seconds, then lower slowly.  5. Do 8 to 12 repetitions.  Heel dig bridging    1. Lie on your back with both knees bent and your ankles bent so that only your heels are digging into the floor. Your knees should be bent about 90 degrees.  2. Then push your heels into the floor, squeeze your buttocks, and lift your hips off  the floor until your shoulders, hips, and knees are all in a straight line.  3. Hold for about 6 seconds as you continue to breathe normally, and then  slowly lower your hips back down to the floor and rest for up to 10 seconds.  4. Do 8 to 12 repetitions.  Hamstring curls    1. Lie on your stomach with your knees straight. If your kneecap is uncomfortable, roll up a washcloth and put it under your leg just above your kneecap.  2. Lift the foot of your injured leg by bending the knee so that you bring the foot up toward your buttock. If this motion hurts, try it without bending your knee quite as far. This may help you avoid any painful motion.  3. Slowly lower your leg back to the floor.  4. Do 8 to 12 repetitions.  5. With permission from your doctor or physical therapist, you may also want to add a cuff weight to your ankle (not more than 5 pounds). With weight, you do not have to lift your leg more than 12 inches to get a hamstring workout.  Shallow standing knee bends    Note: Do this exercise only if you have very little pain; if you have no clicking, locking, or giving way if you have an injured knee; and if it does not hurt while you are doing 8 to 12 repetitions.  1. Stand with your hands lightly resting on a counter or chair in front of you. Put your feet shoulder-width apart.  2. Slowly bend your knees so that you squat down like you are going to sit in a chair. Make sure your knees do not go in front of your toes.  3. Lower yourself about 6 inches. Your heels should remain on the floor at all times.  4. Rise slowly to a standing position.  Heel raises    1. Stand with your feet a few inches apart, with your hands lightly resting on a counter or chair in front of you.  2. Slowly raise your heels off the floor while keeping your knees straight.  3. Hold for about 6 seconds, then slowly lower your heels to the floor.  4. Do 8 to 12 repetitions several times during the day.  Follow-up care is a key part of your treatment and safety. Be sure to make and go to all appointments, and call your doctor if you are having  problems. It's also a good idea to know your test results and keep a list of the medicines you take.  Where can you learn more?  Go to [employer]  Enter [phone] in the search box to learn more about "Knee: Exercises."  ?? 2006-2016 [employer], [employer]. Care instructions adapted under license by [employer] (which disclaims liability or warranty for this information). This care instruction is for use with your licensed healthcare professional. If you have questions about a medical condition or this instruction, always ask your healthcare professional. Healthwise, Incorporated disclaims any warranty or liability for your use of this information.  Content Version: 11.0.578772; Current as of: Nov 17, 2014        Knee: Exercises  Your Care Instructions  Here are some examples of exercises for your knee. Start each exercise slowly. Ease off the exercise if you start to have pain.  Your doctor or physical therapist will tell you when you can start these exercises and which ones will work best for you.  How to do  the exercises  Quad sets    13. Sit with your leg straight and supported on the floor or a firm bed. (If you feel discomfort in the front or back of your knee, place a small towel roll under your knee.)  14. Tighten the muscles on top of your thigh by pressing the back of your knee flat down to the floor. (If you feel discomfort under your kneecap, place a small towel roll under your knee.)  15. Hold for about 6 seconds, then rest for up to 10 seconds.  16. Do 8 to 12 repetitions several times a day.  Straight-leg raises to the front    11. Lie on your back with your good knee bent so that your foot rests flat on the floor. Your injured leg should be straight. Make sure that your low back has a normal curve. You should be able to slip your flat hand in between the floor and the small of your back, with your palm touching the  floor and your back touching the back of your hand.  12. Tighten the thigh muscles in the injured leg by pressing the back of your knee flat down to the floor. Hold your knee straight.  13. Keeping the thigh muscles tight, lift your injured leg up so that your heel is about 12 inches off the floor. Hold for about 6 seconds and then lower slowly.  14. Do 8 to 12 repetitions, 3 times a day.  Straight-leg raises to the outside    6. Lie on your side, with your injured leg on top.  7. Tighten the front thigh muscles of your injured leg to keep your knee straight.  8. Keep your hip and your leg straight in line with the rest of your body, and keep your knee pointing forward. Do not drop your hip back.  9. Lift your injured leg straight up toward the ceiling, about 12 inches off the floor. Hold for about 6 seconds, then slowly lower your leg.  10. Do 8 to 12 repetitions.  Straight-leg raises to the back    4. Lie on your stomach, and lift your leg straight up behind you (toward the ceiling).  5. Lift your toes about 6 inches off the floor, hold for about 6 seconds, then lower slowly.  6. Do 8 to 12 repetitions.  Straight-leg raises to the inside    6. Lie on the side of your body with the injured leg.  7. You can either prop your other (good) leg up on a chair, or you can bend your good knee and put that foot in front of your injured knee. Do not drop your hip back.  8. Tighten the muscles on the front of your thigh to straighten your injured knee.  9. Keep your kneecap pointing forward, and lift your whole leg up toward the ceiling about 6 inches. Hold for about 6 seconds, then lower slowly.  10. Do 8 to 12 repetitions.  Heel dig bridging    5. Lie on your back with both knees bent and your ankles bent so that only your heels are digging into the floor. Your knees should be bent about 90 degrees.  6. Then push your heels into the floor, squeeze your buttocks, and lift  your hips off the floor until your shoulders, hips, and knees are all in a straight line.  7. Hold for about 6 seconds as you continue to breathe normally, and then slowly lower your hips  back down to the floor and rest for up to 10 seconds.  8. Do 8 to 12 repetitions.  Hamstring curls    6. Lie on your stomach with your knees straight. If your kneecap is uncomfortable, roll up a washcloth and put it under your leg just above your kneecap.  7. Lift the foot of your injured leg by bending the knee so that you bring the foot up toward your buttock. If this motion hurts, try it without bending your knee quite as far. This may help you avoid any painful motion.  8. Slowly lower your leg back to the floor.  9. Do 8 to 12 repetitions.  10. With permission from your doctor or physical therapist, you may also want to add a cuff weight to your ankle (not more than 5 pounds). With weight, you do not have to lift your leg more than 12 inches to get a hamstring workout.  Shallow standing knee bends    Note: Do this exercise only if you have very little pain; if you have no clicking, locking, or giving way if you have an injured knee; and if it does not hurt while you are doing 8 to 12 repetitions.  5. Stand with your hands lightly resting on a counter or chair in front of you. Put your feet shoulder-width apart.  6. Slowly bend your knees so that you squat down like you are going to sit in a chair. Make sure your knees do not go in front of your toes.  7. Lower yourself about 6 inches. Your heels should remain on the floor at all times.  8. Rise slowly to a standing position.  Heel raises    5. Stand with your feet a few inches apart, with your hands lightly resting on a counter or chair in front of you.  6. Slowly raise your heels off the floor while keeping your knees straight.  7. Hold for about 6 seconds, then slowly lower your heels to the floor.  8. Do 8 to 12 repetitions several times during the day.   Follow-up care is a key part of your treatment and safety. Be sure to make and go to all appointments, and call your doctor if you are having problems. It's also a good idea to know your test results and keep a list of the medicines you take.  Where can you learn more?  Go to [employer]  Enter [phone] in the search box to learn more about "Knee: Exercises."  ?? 2006-2016 [employer]. Care instructions adapted under license by [employer] (which disclaims liability or warranty for this information). This care instruction is for use with your licensed healthcare professional. If you have questions about a medical condition or this instruction, always ask your healthcare professional. [employer] disclaims any warranty or liability for your use of this information.  Content Version: 11.0.578772; Current as of: Nov 17, 2014

## 2015-03-30 NOTE — Progress Notes (Signed)
HISTORY OF PRESENT ILLNESS  Julian Bailey is a 69 y.o. male.    has a past medical history of Arthritis; BPH (benign prostatic hyperplasia); Cervical disc disease; DVT (deep venous thrombosis) (HCC) (x 2 2009  and 2011 ); ED (erectile dysfunction); Hypertension; and Thromboembolus (HCC).    Chief Complaint   Patient presents with   ??? Follow Up Chronic Condition   ??? Knee Pain     right knee pain, fell the 1st of Sept and his knee is still hurting         HPI  The patient is a very pleasant 86 -year-old white male with the above listed past medical history who presents to the office today to follow up care.  Forgot to get labs      The patient presents to follow up for longstanding hypertension.  Pt is a  is taking BP at home has been well controlled. He denies any symptoms referable to hypertension. This would include but is not limited to headache, chest pain, shortness of breath, nausea, vomiting and neurological deficits.  The patient states that he has not had any side effects on the medication.  He denies any cough associated with the Lisinopril and overall is doing well.  Takes BP at home usually 120/low 80's     BP Readings from Last 3 Encounters:   03/30/15 128/78   12/09/14 144/88   10/23/14 130/82       The patient presents to follow up for chronic anticoagulation on Coumadin.  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  The patient takes his PT and INR at home and the numbers are called or faxed to the office.  He has had stable levels for several months.  He is doing well, no signs of bleeding, no changes in diet, no recent illness.  Currently holding for Epidural shots in am.      The patient also presents to follow up today for longstanding elevated cholesterol.  He has not had his cholesterol checked in several months.  He has been trying to follow a diet closely.  He does intermittently exercise.  He is trying to be more active.  Overall the patient states the  is doing quite well.  Discussed the patient's above normal BMI with him.  I have recommended the following interventions: weight loss from baseline weight .  The BMI follow up plan is as follows: BMI is out of normal parameters and plan is as follows: I have counseled this patient on diet and exercise regimens      Wt Readings from Last 3 Encounters:   03/30/15 215 lb (97.5 kg)   12/09/14 215 lb (97.5 kg)   10/23/14 212 lb (96.2 kg)       Lab Results   Component Value Date/Time    CHOLESTEROL, TOTAL 160 03/31/2014 08:30 AM    HDL CHOLESTEROL 51 03/31/2014 08:30 AM    LDL, CALCULATED 93 03/31/2014 08:30 AM    VLDL, CALCULATED 20 03/31/2014 08:30 AM    TRIGLYCERIDE 100 03/31/2014 08:30 AM       The patient does not have known history of coronary artery disease.      Sees Dr. Omer Jack for ED and BPH     The patient also presents to the office today for right knee pain times approximately four weeks.  The patient fell while working in his yard approximately four weeks ago.  No loss of consciousness and he did not hit his  head.  The patient states since that time it has been sore mainly both lateral and medial anterior.  It has at times caused a mild limp.  There has been no swelling and no bruising. The symptoms had progressed to a point and then plateaued.  It is described as a nagging ache.  He does have intermittent Mobic, however, the patient takes this very sparingly as he is anticoagulated on steroids.  Discussed rest, ice, compression, elevation, and exercise hand outs given today.          Preventative Health  Requests flu shot  flu shot today -- we do not yet have them in Suggest that he goes to pharmacy   Needs Prevnar     Pt is a fall risk - counseled extensively - given handout   Fall Risk Assessment, last 12 mths 03/30/2015   Able to walk? Yes   Fall in past 12 months? Yes   Fall with injury? Yes   Number of falls in past 12 months 1   Fall Risk Score 2                   Lab Results    Component Value Date/Time    PROSTATE SPECIFIC AG 2.9 12/09/2014 08:40 AM    PROSTATE SPECIFIC AG 2.2 09/23/2013 09:35 AM    PROSTATE SPECIFIC AG 3.8 02/29/2012 08:55 AM       Review of Systems   Constitutional: Negative for fever, chills, weight loss, malaise/fatigue and diaphoresis.   HENT: Negative for congestion and sore throat.    Eyes: Negative for blurred vision and photophobia.   Respiratory: Negative for cough and shortness of breath.    Cardiovascular: Negative for chest pain, palpitations and leg swelling.   Gastrointestinal: Negative for heartburn, nausea, vomiting, abdominal pain, diarrhea, constipation, blood in stool and melena.   Genitourinary: Negative for dysuria.   Musculoskeletal: Negative for myalgias. +joint pain   Skin: Postive for rash- Seeing Dr. Melrose Nakayama -- recurrent  Contact Dermatitis    Neurological: Negative for dizziness, loss of consciousness, weakness and headaches.   Psychiatric/Behavioral: Negative.      Past Medical History   Diagnosis Date   ??? Arthritis    ??? BPH (benign prostatic hyperplasia)    ??? Cervical disc disease    ??? DVT (deep venous thrombosis) (HCC) x 2 2009  and 2011      after knee surgery then after long car ride    ??? ED (erectile dysfunction)    ??? Hypertension    ??? Thromboembolus [facility])      Past Surgical History   Procedure Laterality Date   ??? 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 Hx endoscopy       colonoscopy last was in 2008    ??? Hx orthopaedic       Family History   Problem Relation Age of Onset   ??? Arthritis-osteo Mother    ??? Hypertension Mother    ??? Arthritis-osteo Father    ??? Arthritis-osteo Brother    ??? Cancer Paternal Uncle      unknown   ??? Cancer Paternal Uncle      stomach   ??? Diabetes Paternal Uncle      Social History     Social History   ??? Marital status: MARRIED     Spouse name: N/A   ??? Number of children: N/A   ??? Years of education: N/A  Occupational History   ??? Not on file.     Social History Main Topics    ??? Smoking status: Former Smoker     Packs/day: 0.50     Years: 20.00     Types: Cigarettes     Quit date: 11/30/1984   ??? Smokeless tobacco: Never Used   ??? Alcohol use Yes      Comment: 3 glasses of wine "couple of drinks"    ??? Drug use: No   ??? Sexual activity: Yes     Partners: Female     Other Topics Concern   ??? Blood Transfusions No     Social History Narrative    Medicare wellness 09/23/13      No Known Allergies  Current Outpatient Prescriptions on File Prior to Visit   Medication Sig Dispense Refill   ??? tamsulosin (FLOMAX) 0.4 mg capsule Take 1 Cap by mouth daily. 90 Cap 3   ??? lisinopril (PRINIVIL, ZESTRIL) 20 mg tablet TAKE ONE TABLET BY MOUTH ONCE DAILY 90 Tab 3   ??? fexofenadine (ALLEGRA) 180 mg tablet Take  by mouth daily.     ??? vardenafil (LEVITRA) 10 mg tablet Take 10 mg by mouth as needed. One hour prior to desired affect 30 Tab 1   ??? multivitamin (ONE A DAY) tablet Take 1 Tab by mouth daily.     ??? gabapentin (NEURONTIN) 300 mg capsule Take 1 Cap by mouth nightly. 30 Cap 2   ??? meloxicam (MOBIC) 15 mg tablet Take 1 Tab by mouth daily as needed for Pain. 30 Tab 2   ??? warfarin (COUMADIN) 5 mg tablet TAKE AS DIRECTED 90 Tab 3   ??? warfarin (COUMADIN) 1 mg tablet As directed 180 Tab 2   ??? desonide (DESOWEN) 0.05 % topical ointment Apply  to affected area two (2) times a day. 15 g 2     No current facility-administered medications on file prior to visit.        Vitals:    03/30/15 0907   BP: 128/78   Pulse: 82   Resp: 18   Temp: 98.2 ??F (36.8 ??C)   TempSrc: Temporal   SpO2: 98%   Weight: 215 lb (97.5 kg)   Height:  (1.778 m)   .  Body mass index is 30.85 kg/(m^2).            Physical Exam   Nursing note and vitals reviewed.  Constitutional: He is oriented to person, place, and time. He appears well-developed and well-nourished. No distress.   HENT:   Head: Normocephalic and atraumatic.   Right Ear: External ear normal.   Left Ear: External ear normal.   Nose: Nose normal.    Mouth/Throat: Oropharynx is clear and moist. No oropharyngeal exudate.   Eyes: Conjunctivae are normal. Pupils are equal, round, and reactive to light. No scleral icterus.   Neck: Normal range of motion. Neck supple. No JVD present. No tracheal deviation present.   Cardiovascular: Normal rate, regular rhythm, normal heart sounds and intact distal pulses.  Exam reveals no gallop and no friction rub.    No murmur heard.  Pulmonary/Chest: Effort normal and breath sounds normal. No respiratory distress.   Abdominal: Soft. Bowel sounds are normal. He exhibits no distension and no mass. There is no tenderness.   Musculoskeletal:   Knee (r) crepitus - otherwise unremarkable   Lymphadenopathy:     He has no cervical adenopathy.   Neurological: He is alert and oriented to person,  place, and time.   Skin: Skin is warm and dry. He has bilateral hemosiderin pigmentation, with no leg hair, and ulcerating rashes on LE being followed by Derm, without evidence of cellulitis.   Erythematous rash on extremities   Psychiatric: He has a normal mood and affect. His behavior is normal.       ASSESSMENT and PLAN    ICD-10-CM ICD-9-CM    1. Dyslipidemia E78.5 272.4 LIPID PANEL   2. Benign essential HTN I10 401.1 CBC WITH AUTOMATED DIFF      METABOLIC PANEL, COMPREHENSIVE      THYROID PANEL W/TSH   3. Anticoagulated on Coumadin Z51.81 V58.83     Z79.01 V58.61    4. Right knee pain, unspecified chronicity M25.561 719.46    5. Encounter for immunization Z23 V03.89 pneumococcal 13 val conj dip (PREVNAR-13) 0.5 mL syrg injection   Continue coumadin   RICE home exercises   Xray if pain does not improve     Follow-up Disposition: 6 months   lab results and schedule of future lab studies reviewed with patient  reviewed diet, exercise and weight control  cardiovascular risk and specific lipid/LDL goals reviewed  reviewed medications and side effects in detail      I have discussed the above plan with the patient. Patient has also been  given instructions and information on his conditions.  Patient is aware of all possible side effects and agrees with the above mentioned plan.     Recommended sodium restriction. Reviewed diet, exercise and weight control.     Recommend 1800 cal low cholesterol (low fat) lean protein whole grain diet       Discussed pathophysiology of illness, risks versus benefits of treatments,  & possible complications. Questions answered & treatment as per orders. Total time = 25 minutes with over 50% being counseling & co-ordination of care.      Donnella Bi, DO

## 2015-03-31 ENCOUNTER — Ambulatory Visit: Admit: 2015-03-31 | Discharge: 2015-03-31 | Payer: MEDICARE | Attending: Family Medicine | Primary: Family Medicine

## 2015-03-31 DIAGNOSIS — G8929 Other chronic pain: Secondary | ICD-10-CM

## 2015-03-31 MED ORDER — TRIAMCINOLONE ACETONIDE 40 MG/ML SUSP FOR INJECTION
40 mg/mL | Freq: Once | INTRAMUSCULAR | 0 refills | Status: AC
Start: 2015-03-31 — End: 2015-03-31

## 2015-04-01 NOTE — Progress Notes (Signed)
HISTORY OF PRESENT ILLNESS  Julian Bailey is a 69 y.o. male.     has a past medical history of Arthritis; BPH (benign prostatic hyperplasia); Cervical disc disease; DVT (deep venous thrombosis) (HCC) (x 2 2009  and 2011 ); ED (erectile dysfunction); Hypertension; and Thromboembolus (HCC).    Chief Complaint   Patient presents with   ??? Injection     right knee injection       HPI   The patient is a very pleasant 69 year old White male who presents to the office today for short term follow up of knee pain.  The patient's right knee pain has not changed since he was seen yesterday.  The patient is currently off his Coumadin.  He had epidural injections in his back this morning and is wanting a right steroid shot in his knee since he is currently not therapeutic on his Coumadin.        ROS  A thorough 10 point review of systems was done and was unremarkable except for HPI and chronic medical conditions.     Past Medical History   Diagnosis Date   ??? Arthritis    ??? BPH (benign prostatic hyperplasia)    ??? Cervical disc disease    ??? DVT (deep venous thrombosis) (HCC) x 2 2009  and 2011      after knee surgery then after long car ride    ??? ED (erectile dysfunction)    ??? Hypertension    ??? Thromboembolus [facility])      Past Surgical History   Procedure Laterality Date   ??? 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 Hx endoscopy       colonoscopy last was in 2008    ??? Hx orthopaedic       Family History   Problem Relation Age of Onset   ??? Arthritis-osteo Mother    ??? Hypertension Mother    ??? Arthritis-osteo Father    ??? Arthritis-osteo Brother    ??? Cancer Paternal Uncle      unknown   ??? Cancer Paternal Uncle      stomach   ??? Diabetes Paternal Uncle      Social History     Social History   ??? Marital status: MARRIED     Spouse name: N/A   ??? Number of children: N/A   ??? Years of education: N/A     Occupational History   ??? Not on file.     Social History Main Topics   ??? Smoking status: Former Smoker     Packs/day: 0.50      Years: 20.00     Types: Cigarettes     Quit date: 11/30/1984   ??? Smokeless tobacco: Never Used   ??? Alcohol use Yes      Comment: 3 glasses of wine "couple of drinks"    ??? Drug use: No   ??? Sexual activity: Yes     Partners: Female     Other Topics Concern   ??? Blood Transfusions No     Social History Narrative    Medicare wellness 09/23/13      No Known Allergies  Current Outpatient Prescriptions on File Prior to Visit   Medication Sig Dispense Refill   ??? tamsulosin (FLOMAX) 0.4 mg capsule Take 1 Cap by mouth daily. 90 Cap 3   ??? gabapentin (NEURONTIN) 300 mg capsule Take 1 Cap by mouth nightly. 30 Cap 2   ???  meloxicam (MOBIC) 15 mg tablet Take 1 Tab by mouth daily as needed for Pain. 30 Tab 2   ??? lisinopril (PRINIVIL, ZESTRIL) 20 mg tablet TAKE ONE TABLET BY MOUTH ONCE DAILY 90 Tab 3   ??? desonide (DESOWEN) 0.05 % topical ointment Apply  to affected area two (2) times a day. 15 g 2   ??? fexofenadine (ALLEGRA) 180 mg tablet Take  by mouth daily.     ??? vardenafil (LEVITRA) 10 mg tablet Take 10 mg by mouth as needed. One hour prior to desired affect 30 Tab 1   ??? multivitamin (ONE A DAY) tablet Take 1 Tab by mouth daily.     ??? warfarin (COUMADIN) 5 mg tablet TAKE AS DIRECTED 90 Tab 3   ??? warfarin (COUMADIN) 1 mg tablet As directed 180 Tab 2     No current facility-administered medications on file prior to visit.        Vitals:    03/31/15 1015   BP: 122/80   Pulse: 79   Resp: 18   Temp: 98.2 ??F (36.8 ??C)   TempSrc: Temporal   SpO2: 98%   Weight: 215 lb (97.5 kg)   Height:  (1.778 m)   .  Body mass index is 30.85 kg/(m^2).        Physical Exam   Musculoskeletal:        Right knee: He exhibits decreased range of motion and swelling. He exhibits no effusion, no ecchymosis, no deformity, no laceration, no erythema, normal alignment and no LCL laxity. Tenderness found. Medial joint line and lateral joint line tenderness noted.        Legs:    JOINT INJECTION  Discussed treatment options with patient with benefits, risk, side  effects, and possible complications.  Questions answered.   Patient was placed in supine position with head at a 45 degrees.  Pt's knee was placed in extension.  The skin over the upper quadrant of the knee was cleaned with Betadine.  Identity of the patella was determined by palpation.  The ideal location to enter the synovial fluid sac was located via palpation.  Using a  3 cc syringe, a mix  Kenalog /ml and Lidocaine 10 mg/ml (total 2 ml) was drawn up.  Using a 23 gauge 1 1/2 needle, the synovial fluid sac was entered, and steroid solution was injected.  The needle was withdrawn and pressure was applied to the site. The site was covered with a simple bandage.  Pt tolerated injection well.   Discussed home joint care.        ASSESSMENT and PLAN    ICD-10-CM ICD-9-CM    1. Chronic pain of right knee M25.561 719.46 DRAIN/INJECT LARGE JOINT/BURSA    G89.29 338.29 TRIAMCINOLONE ACETONIDE INJ      triamcinolone acetonide (KENALOG) 40 mg/mL injection   Further imaging and pt if this does not resolve the pain.     Follow-up Disposition: PRN   reviewed diet, exercise and weight control  cardiovascular risk and specific lipid/LDL goals reviewed  reviewed medications and side effects in detail  radiology results and schedule of future radiology studies reviewed with patient    I have discussed the above plan with the patient. Patient has also been given instructions and information on his conditions.  Patient is aware of all possible side effects and agrees with the above mentioned plan.     Donnella Bi, DO

## 2015-04-01 NOTE — Addendum Note (Signed)
Addended by: Donnella Bi on: 04/01/2015 08:52 AM      Modules accepted: Level of Service

## 2015-04-13 NOTE — Progress Notes (Signed)
MD INR called pts INR 1.7, pt taking 6 mg daily, pt states "im bumping up to 7 mg daily and rechecking in 1 week."

## 2015-05-04 ENCOUNTER — Encounter

## 2015-05-04 MED ORDER — GABAPENTIN 300 MG CAP
300 mg | ORAL_CAPSULE | Freq: Every evening | ORAL | 2 refills | Status: DC
Start: 2015-05-04 — End: 2015-06-26

## 2015-05-04 NOTE — Telephone Encounter (Signed)
Patient wants refills on Gabapentin.  Last seen on 03/31/2015

## 2015-06-08 NOTE — Telephone Encounter (Signed)
Patient wants refills on Lisinopril. Last seen on 04/14/2015

## 2015-06-09 MED ORDER — LISINOPRIL 20 MG TAB
20 mg | ORAL_TABLET | ORAL | 3 refills | Status: DC
Start: 2015-06-09 — End: 2016-03-16

## 2015-06-11 ENCOUNTER — Encounter: Attending: Urology | Primary: Family Medicine

## 2015-06-17 ENCOUNTER — Ambulatory Visit: Admit: 2015-06-17 | Discharge: 2015-06-17 | Payer: MEDICARE | Attending: Urology | Primary: Family Medicine

## 2015-06-17 DIAGNOSIS — N401 Enlarged prostate with lower urinary tract symptoms: Secondary | ICD-10-CM

## 2015-06-17 LAB — AMB POC URINALYSIS DIP STICK AUTO W/O MICRO
Blood (UA POC): NEGATIVE
Glucose (UA POC): NEGATIVE
Ketones (UA POC): NEGATIVE
Leukocyte esterase (UA POC): NEGATIVE
Nitrites (UA POC): NEGATIVE
Protein (UA POC): NEGATIVE mg/dL
Specific gravity (UA POC): 1.015 (ref 1.001–1.035)
pH (UA POC): 7 (ref 4.6–8.0)

## 2015-06-17 LAB — AMB POC CREATININE ASSAY, OTHER SOURCE
Creatinine, other source: 50
Source: NORMAL

## 2015-06-17 NOTE — Patient Instructions (Addendum)
[facility]     Office working hours are 8:00am-4:30pm    Office phone number is [phone] and the fax line is [phone]  For non-emergent medical care and clinical advice during office hours  1. Call the office or  2. Send a message to office staff via mychart  Emergency care can be obtained at the [facility] ER, Urgent Care or 911.    Patient Satisfaction Survey   We appreciate you sharing your e-mail address with us. Please watch for our patient  satisfaction survey which you will receive by e-mail. We strive to provide you with the   best care possible. We respect all comments and will take them into consideration to improve our service.  Thank you for your participation.    Tamsulosin (By mouth)   Tamsulosin Hydrochloride (tam-SOO-loe-sin hye-droe-KLOR-ide)  Treats benign prostatic hyperplasia (enlarged prostate).   Brand Name(s):Flomax   There may be other brand names for this medicine.  When This Medicine Should Not Be Used:   This medicine is not right for everyone. Do not use it if you had an allergic reaction to tamsulosin.  How to Use This Medicine:   Capsule  ?? Take your medicine as directed. Your dose may need to be changed several times to find what works best for you.  ?? Take this medicine 30 minutes after the same meal each day. Swallow the capsule whole. Do not crush, chew, or open it.  ?? Read and follow the patient instructions that come with this medicine. Talk to your doctor or pharmacist if you have any questions.  ?? Missed dose: Take a dose as soon as you remember. If it is almost time for your next dose, wait until then and take a regular dose. Do not take extra medicine to make up for a missed dose. If you forget to take this medicine for several days in a row, talk to your doctor before you start taking it again.  ?? Store the medicine in a closed container at room temperature, away from heat, moisture, and direct light.  Drugs and Foods to Avoid:    Ask your doctor or pharmacist before using any other medicine, including over-the-counter medicines, vitamins, and herbal products.  ?? Some medicines can affect how tamsulosin works. Tell your doctor if you are using another alpha blocker medicine, cimetidine, erythromycin, ketoconazole, paroxetine, terbinafine, warfarin, or medicine for erectile dysfunction.  Warnings While Using This Medicine:   ?? Tell your doctor if you have kidney disease, liver disease, low blood pressure, prostate cancer, or an allergy to sulfa drugs.  ?? Tell your doctor if you plan to have cataract or glaucoma surgery. This medicine may cause eye problems during surgery.  ?? This medicine may make you dizzy or lightheaded. Do not drive or do anything else that could be dangerous until you know how this medicine affects you. Stand or sit up slowly if you feel dizzy.  ?? Your doctor will check your progress and the effects of this medicine at regular visits. Keep all appointments.  ?? Keep all medicine out of the reach of children. Never share your medicine with anyone.  Possible Side Effects While Using This Medicine:   Call your doctor right away if you notice any of these side effects:  ?? Allergic reaction: Itching or hives, swelling in your face or hands, swelling or tingling in your mouth or throat, chest tightness, trouble breathing  ?? Blistering, peeling, red skin rash  ?? Lightheadedness, dizziness, fainting  ??   Painful, prolonged erection of your penis  If you notice these less serious side effects, talk with your doctor:   ?? Headache  ?? Problems with ejaculation  ?? Runny or stuffy nose  If you notice other side effects that you think are caused by this medicine, tell your doctor.   Call your doctor for medical advice about side effects. You may report side effects to FDA at 1-800-FDA-1088  ?? 2016 [employer] Information is for End User's use only and may not be sold, redistributed or otherwise used for commercial  purposes.  The above information is an educational aid only. It is not intended as medical advice for individual conditions or treatments. Talk to your doctor, nurse or pharmacist before following any medical regimen to see if it is safe and effective for you.       Benign Prostatic Hyperplasia: Care Instructions  Your Care Instructions    Benign prostatic hyperplasia, or BPH, is an enlarged prostate gland. The prostate is a small gland that makes some of the fluid in semen. Prostate enlargement happens to almost all men as they age. It is usually not serious. BPH does not cause prostate cancer.  As the prostate gets bigger, it may partly block the flow of urine. You may have a hard time getting a urine stream started or completely stopped. BPH can cause dribbling. You may have a weak urine stream, or you may have to urinate more often than you used to, especially at night. Most men find these problems easy to manage.  You do not need treatment unless your symptoms bother you a lot or you have other problems, such as bladder infections or stones. In these cases, medicines may help. Surgery is not needed unless the urine flow is blocked or the symptoms do not get better with medicine.  Follow-up care is a key part of your treatment and safety. Be sure to make and go to all appointments, and call your doctor if you are having problems. It's also a good idea to know your test results and keep a list of the medicines you take.  How can you care for yourself at home?  ?? Take plenty of time to urinate. Try to relax.  ?? Try "double voiding." Urinate as much you can, relax for a few moments, and then try to urinate again.  ?? Sit on the toilet to urinate.  ?? Read or think of other things while you are waiting.  ?? Turn on a faucet, or try to picture running water. Some men find that this helps get their urine flowing.  ?? If dribbling is a problem, wash your penis daily to avoid skin irritation and infection.   ?? Avoid caffeine and alcohol. These drinks will increase how often you need to urinate. Spread your fluid intake throughout the day. If the urge to urinate often wakes you at night, limit your fluid intake in the evening. Urinate right before you go to bed.  ?? Many over-the-counter cold and allergy medicines can make the symptoms of BPH worse. Avoid antihistamines, decongestants, and allergy pills, if you can. Read the warnings on the package.  ?? If you take any prescription medicines, especially tranquilizers or antidepressants, ask your doctor or pharmacist whether they can cause urination problems. There may be other medicines you can use that do not cause urinary problems.  ?? Be safe with medicines. Take your medicines exactly as prescribed. Call your doctor if you think you are   having a problem with your medicine.  When should you call for help?  Call your doctor now or seek immediate medical care if:  ?? You cannot urinate at all.  ?? You have symptoms of a urinary infection. For example:  ?? You have blood or pus in your urine.  ?? You have pain in your back just below your rib cage. This is called flank pain.  ?? You have a fever, chills, or body aches.  ?? It hurts to urinate.  ?? You have groin or belly pain.  Watch closely for changes in your health, and be sure to contact your doctor if:  ?? It hurts when you ejaculate.  ?? Your urinary problems get a lot worse or bother you a lot.  Where can you learn more?  Go to http://www.healthwise.net/[employer].  Enter C033 in the search box to learn more about "Benign Prostatic Hyperplasia: Care Instructions."  Current as of: Nov 18, 2014  Content Version: 11.1  ?? 2006-2016 [employer]. Care instructions adapted under license by [employer] (which disclaims liability or warranty for this information). If you have questions about a medical condition or this instruction, always ask your healthcare professional. [employer]wise[employer]  Incorpor[employer] disclaims any warranty or liability for your use of this information.

## 2015-06-17 NOTE — Progress Notes (Signed)
Male Progress Note      Patient: Julian Bailey               Sex: male             MRN: [id number]      Date of Birth:  03/24/1946      Age:  69 y.o.               Subjective:     Julian Bailey is a 69 y.o. male who is being seen for follow-up for BPH with LUTS, incomplete bladder emptying, ED. He takes tamsulosin 0.4 mg daily for BPH. He doesn't need refills.  He hasn't been having much trouble urinating.  He feels that he empties his bladder well.  No frequency or urgency.  He has nocturia 1 time nightly.  No dysuria or gross hematuria.    He continues to take Levitra PRN with good results.  He doesn't need refills on this medication.    He is going to have a flu shot today.  He has an Rx for the pneumonia vaccine.    15 minutes were spent in face-to-face time with the patient at his visit today, >50% of which were spent in counseling in treatment options and outcomes, health maintenance, and answering all questions.     Past Medical History   Diagnosis Date   ??? Arthritis    ??? BPH (benign prostatic hyperplasia)    ??? Cervical disc disease    ??? DVT (deep venous thrombosis) (HCC) x 2 2009  and 2011      after knee surgery then after long car ride    ??? ED (erectile dysfunction)    ??? Hypertension    ??? 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 Hx endoscopy       colonoscopy last was in 2008    ??? Hx orthopaedic         Family History   Problem Relation Age of Onset   ??? Arthritis-osteo Mother    ??? Hypertension Mother    ??? Arthritis-osteo Father    ??? Arthritis-osteo Brother    ??? Cancer Paternal Uncle      unknown   ??? Cancer Paternal Uncle      stomach   ??? Diabetes Paternal Uncle        Social History     Social History   ??? Marital status: MARRIED     Spouse name: N/A   ??? Number of children: N/A   ??? Years of education: N/A     Social History Main Topics   ??? Smoking status: Former Smoker     Packs/day: 0.50     Years: 20.00      Types: Cigarettes     Quit date: 11/30/1984   ??? Smokeless tobacco: Never Used   ??? Alcohol use Yes      Comment: 3 glasses of wine "couple of drinks"    ??? Drug use: No   ??? Sexual activity: Yes     Partners: Female     Other Topics Concern   ??? Blood Transfusions No     Social History Narrative    Medicare wellness 09/23/13        Prior to Admission medications    Medication Sig Start Date End Date Taking? Authorizing Provider  lisinopril (PRINIVIL, ZESTRIL) 20 mg tablet TAKE ONE TABLET BY MOUTH ONCE DAILY 06/08/15  Yes Lesly A Hollon, NP   gabapentin (NEURONTIN) 300 mg capsule Take 1 Cap by mouth nightly. 05/04/15  Yes Lesly A Hollon, NP   tamsulosin (FLOMAX) 0.4 mg capsule Take 1 Cap by mouth daily. 11/12/14  Yes Laqueshia Cihlar P Nasier Thumm, DO   warfarin (COUMADIN) 5 mg tablet TAKE AS DIRECTED 06/16/14  Yes Donnella Bi, DO   warfarin (COUMADIN) 1 mg tablet As directed 06/16/14  Yes Lauren Samara Deist, DO   desonide (DESOWEN) 0.05 % topical ointment Apply  to affected area two (2) times a day. 01/23/14  Yes Lauren Samara Deist, DO   fexofenadine (ALLEGRA) 180 mg tablet Take  by mouth daily.   Yes Upgrade Background   vardenafil (LEVITRA) 10 mg tablet Take 10 mg by mouth as needed. One hour prior to desired affect 12/20/11  Yes Donnella Bi, DO   multivitamin (ONE A DAY) tablet Take 1 Tab by mouth daily.   Yes Upgrade Background   meloxicam (MOBIC) 15 mg tablet Take 1 Tab by mouth daily as needed for Pain. 09/10/14   Donnella Bi, DO       No Known Allergies    Immunization History   Administered Date(s) Administered   ??? Influenza Vaccine 03/26/2014   ??? Tdap 05/13/2013         Review of Systems:  General:  no weight loss, fever, night sweats  Head:   negative  Eyes:   negative  Respiratory:  Patient denies cough, chest pain, dyspnea, wheezing or hemoptysis  Cardiac:  negative  Gatrointestinal: negative  Urinary:  BPH  Musculoskeletal: back pain, joint pain  Endocrine:  Denies: polydipsia/polyuria   Hematological: there is no easy bleeding or bruising  Psychological: negative  Skin:   Denies: rash, itching, hives      Objective:      Visit Vitals   ??? BP (!) 154/102   ??? Pulse 90   ??? Temp 98.8 ??F (37.1 ??C) (Oral)   ??? Ht  (1.778 m)   ??? Wt 224 lb (101.6 kg)   ??? BMI 32.14 kg/m2         Labs:  Urinalysis:    Results for orders placed or performed in visit on 12/09/14   AMB POC URINALYSIS DIP STICK AUTO W/O MICRO     Status: None   Result Value Ref Range Status    Color (UA POC) Yellow  Final    Clarity (UA POC) Clear  Final    Glucose (UA POC) Negative Negative Final    Bilirubin (UA POC)  Negative     Ketones (UA POC) Negative Negative Final    Specific gravity (UA POC) 1.020 1.001 - 1.035 Final    Blood (UA POC) Negative Negative Final    pH (UA POC) 6.0 4.6 - 8.0 Final    Protein (UA POC) Negative Negative mg/dL Final    Urobilinogen (UA POC)  0.2 - 1     Nitrites (UA POC) Negative Negative Final    Leukocyte esterase (UA POC) Negative Negative Final       Lab Results   Component Value Date/Time    Prostate Specific Ag 2.9 12/09/2014 08:40 AM    Prostate Specific Ag 2.2 09/23/2013 09:35 AM    Prostate Specific Ag 3.8 02/29/2012 08:55 AM        Imaging:   None reviewed.      PVR:  84 mL    SHIM Score: NA    AUA/QOL score:  NA      Physical Exam:   Constitutional: Normal appearing patient with well groomed; No deformities; Body habitus appears normal; Nutritionally sound.   Neck: Supple; No JVD; No cervical lymphadenopathy; No carotid bruits; No masses; trachea midline; No thyromegaly  Respiratory: No use of accessory muscles; No intercostal retractions; Clear to auscultation bilaterally  Heart: Regular rate and rhythm; No murmurs, rubs, gallops  GI: soft, NT, ND, + BS in 4 quadrants    GU:   Deferred    Digital Rectal Exam:   Deferred    Musculoskeletal:   No CVA tenderness; Good sensation, ROM, and strength bilaterally of the Upper and Lower extremities.    Assessment/Plan      1. BPH with LUTS - continue tamsulosin 0.4 mg daily. ??No refills are needed at this time.  2. Nocturia - Decrease fluids 3 hours prior to bedtime.  3. ED - Continue Levitra 10 mg and PRN.  No refills needed.  4. Frequency, improved - Patient was advised to increase water intake to at least 64 oz daily and decrease coffee, colas, teas, etc.  5. Urgency, improved - Patient was advised to increase water intake to at least 64 oz daily and decrease coffee, colas, teas, etc. ??  6. Incomplete bladder emptying, improved - continue tamsulosin 0.4 mg daily. Possible side effects of this medication were discussed with the patient to include orthostasis and retrograde ejaculation. The patient was advised to take this medication at least 2 hours apart from the PDE5 inhibitors.   7. I will see back in 6 months for yearly exam and PSA.

## 2015-06-26 ENCOUNTER — Encounter

## 2015-06-26 MED ORDER — GABAPENTIN 300 MG CAP
300 mg | ORAL_CAPSULE | Freq: Two times a day (BID) | ORAL | 2 refills | Status: DC
Start: 2015-06-26 — End: 2016-03-16

## 2015-06-26 NOTE — Progress Notes (Signed)
Pt called stating that Dr. Zollie BeckersPagan suggested that Neurontin be increased to BID. Reviewed note from Dr. Zollie BeckersPagan, will increase dosage as suggested. New prescription sent to pharmacy.

## 2015-06-26 NOTE — Telephone Encounter (Signed)
Reviewed Dr Benjaman LobePagan's office note. Will increase Neurontin to BID.

## 2015-06-26 NOTE — Telephone Encounter (Signed)
LMOM.

## 2015-07-10 NOTE — Progress Notes (Signed)
This note will not be viewable in MyChart.    Attempted to contact pt about pneumonia  care gap per [facility].  Lmtcb, [facility]

## 2015-09-04 ENCOUNTER — Inpatient Hospital Stay: Admit: 2015-09-04 | Payer: MEDICARE | Primary: Family Medicine

## 2015-09-04 DIAGNOSIS — I10 Essential (primary) hypertension: Secondary | ICD-10-CM

## 2015-09-04 LAB — CBC WITH AUTOMATED DIFF
ABS. BASOPHILS: 0 10*3/uL (ref 0.0–0.1)
ABS. EOSINOPHILS: 0.2 10*3/uL (ref 0.0–0.5)
ABS. LYMPHOCYTES: 0.8 10*3/uL (ref 0.8–3.5)
ABS. MONOCYTES: 0.7 10*3/uL — ABNORMAL LOW (ref 0.8–3.5)
ABS. NEUTROPHILS: 3.6 10*3/uL (ref 1.5–8.0)
BASOPHILS: 0 % (ref 0–2)
EOSINOPHILS: 3 % (ref 0–5)
HCT: 43.5 % (ref 41–53)
HGB: 15 g/dL (ref 13.5–17.5)
LYMPHOCYTES: 15 % — ABNORMAL LOW (ref 19–48)
MCH: 32.9 PG — ABNORMAL HIGH (ref 27–31)
MCHC: 34.5 g/dL (ref 31–37)
MCV: 95.4 FL (ref 78–98)
MONOCYTES: 13 % — ABNORMAL HIGH (ref 3–9)
MPV: 10.2 FL (ref 5.9–10.3)
NEUTROPHILS: 69 % (ref 40–74)
NRBC: 0 PER 100 WBC
PLATELET: 93 10*3/uL — ABNORMAL LOW (ref 130–400)
RBC: 4.56 M/uL — ABNORMAL LOW (ref 4.7–6.1)
RDW: 13.5 % (ref 11.5–14.5)
WBC: 5.2 10*3/uL (ref 4.5–10.8)

## 2015-09-04 LAB — METABOLIC PANEL, COMPREHENSIVE
A-G Ratio: 1.4 (ref 1.2–2.2)
ALT (SGPT): 80 U/L — ABNORMAL HIGH (ref 12–78)
AST (SGOT): 43 U/L — ABNORMAL HIGH (ref 15–37)
Albumin: 4 g/dL (ref 3.4–5.0)
Alk. phosphatase: 66 U/L (ref 45–117)
Anion gap: 6 mmol/L (ref 6–15)
BUN/Creatinine ratio: 17 (ref 7–25)
BUN: 18 MG/DL (ref 7–18)
Bilirubin, total: 1.8 MG/DL — ABNORMAL HIGH (ref <1.1)
CO2: 32 mmol/L (ref 21–32)
Calcium: 10.1 MG/DL (ref 8.5–10.1)
Chloride: 104 mmol/L (ref 98–107)
Creatinine: 1.03 MG/DL (ref 0.60–1.30)
GFR est AA: >60 mL/min/{1.73_m2} (ref >60)
GFR est non-AA: >60 mL/min/{1.73_m2} (ref >60)
Globulin: 2.8 g/dL (ref 2.4–3.5)
Glucose: 95 mg/dL (ref 70–110)
Potassium: 4.5 mmol/L (ref 3.5–5.3)
Protein, total: 6.8 g/dL (ref 6.4–8.2)
Sodium: 142 mmol/L (ref 136–145)

## 2015-09-04 LAB — LIPID PANEL
Cholesterol, total: 161 MG/DL (ref <200)
HDL Cholesterol: 38 MG/DL (ref 32–96)
LDL, calculated: 99.64 MG/DL (ref <130)
Triglyceride: 146 MG/DL (ref <150)
VLDL, calculated: 29.2 MG/DL (ref 5–32)

## 2015-09-04 LAB — THYROID PANEL W/TSH
Free thyroxine index: 4 (ref 1.4–5.2)
T3 Uptake: 35 % (ref 31–39)
T4, Total: 11.4 ug/dL (ref 4.7–13.3)
TSH: 1 u[IU]/mL (ref 0.35–3.74)

## 2015-09-04 NOTE — Progress Notes (Signed)
Will discuss at follow-up   Sussan Meter E Alivia Cimino, DO

## 2015-09-09 ENCOUNTER — Encounter

## 2015-09-09 MED ORDER — WARFARIN 1 MG TAB
1 mg | ORAL_TABLET | ORAL | 2 refills | Status: DC
Start: 2015-09-09 — End: 2016-10-03

## 2015-09-09 MED ORDER — WARFARIN 5 MG TAB
5 mg | ORAL_TABLET | ORAL | 3 refills | Status: DC
Start: 2015-09-09 — End: 2015-11-26

## 2015-09-30 ENCOUNTER — Encounter: Attending: Family Medicine | Primary: Family Medicine

## 2015-11-03 ENCOUNTER — Encounter: Attending: Family Medicine | Primary: Family Medicine

## 2015-11-11 ENCOUNTER — Encounter: Attending: Family Medicine | Primary: Family Medicine

## 2015-11-26 ENCOUNTER — Ambulatory Visit: Admit: 2015-11-26 | Discharge: 2015-11-26 | Payer: MEDICARE | Attending: Family Medicine | Primary: Family Medicine

## 2015-11-26 DIAGNOSIS — Z0000 Encounter for general adult medical examination without abnormal findings: Secondary | ICD-10-CM

## 2015-11-26 MED ORDER — TAMSULOSIN SR 0.4 MG 24 HR CAP
0.4 mg | ORAL_CAPSULE | Freq: Every day | ORAL | 3 refills | Status: DC
Start: 2015-11-26 — End: 2016-12-01

## 2015-11-26 MED ORDER — WARFARIN 5 MG TAB
5 mg | ORAL_TABLET | ORAL | 3 refills | Status: DC
Start: 2015-11-26 — End: 2017-02-06

## 2015-11-26 MED ORDER — PNEUMOCOCCAL 13-VAL CONJ VACCINE-DIP CRM (PF) 0.5 ML IM SYRINGE
0.5 mL | Freq: Once | INTRAMUSCULAR | 0 refills | Status: AC
Start: 2015-11-26 — End: 2015-11-26

## 2015-11-26 NOTE — ACP (Advance Care Planning) (Signed)
Completed   Will bring in to have scanned   Donnella BiLauren E Bunny Kleist, DO

## 2015-11-26 NOTE — ACP (Advance Care Planning) (Signed)
Completed   Will bring in to have scanned   Diamon Reddinger E Fumi Guadron, DO

## 2015-11-26 NOTE — Telephone Encounter (Signed)
From: Julian StainsGregory Earl Ganaway  To: Isac CaddyBrian P Mathew Storck, DO  Sent: 11/25/2015 10:41 AM EDT  Subject:  Medication Renewal Request    Original  authorizing provider: Isac CaddyBrian P Thorin Starner, DO    Julian StainsGregory  Earl Bailey would like a refill of the following medications:  tamsulosin  (FLOMAX) 0.4 mg capsule [Tandi Hanko P Kiahna Banghart, DO]    Preferred  pharmacy: [id number] - [place], [place] - [address]    Comment:

## 2015-11-26 NOTE — Progress Notes (Signed)
This is an Sub.  Medicare Annual Wellness Exam (AWV) (Performed 12 months after IPPE or effective date of Medicare Part B enrollment, Once in a lifetime)    I have reviewed the patient's medical history in detail and updated the computerized patient record.     History     The patient is an exceedingly pleasant 70 year old White male who presents to the office today for his Annual [employer].  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   Overall he feels he is doing well.  He denies any headache, unexplainable weight loss, fatigue, fever, or chills.  He denies any headaches or blurred vision.  He denies any dysphagia, trouble with hearing, chest pain, heart palpitations, or paroxysmal nocturnal dyspnea.  He denies any pain down his arm, nausea, vomiting, diarrhea, blood in his stool, or black tarry stools.  He denies any melena or hematochezia.  No neurological deficits.  Overall he feels his mood is doing great.      Past Medical History:   Diagnosis Date   ??? Arthritis    ??? BPH (benign prostatic hyperplasia)    ??? Cervical disc disease    ??? DVT (deep venous thrombosis) (HCC) x 2 2009  and 2011     after knee surgery then after long car ride    ??? ED (erectile dysfunction)    ??? Hypertension    ??? Thromboembolus [facility])       Past Surgical History:   Procedure Laterality Date   ??? HX ENDOSCOPY      colonoscopy last was in 2008    ??? 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 HX ORTHOPAEDIC       Current Outpatient Prescriptions   Medication Sig Dispense Refill   ??? hydrocortisone valerate (WESTCORT) 0.2 % topical cream Apply  to affected area two (2) times a day. use thin layer     ??? testosterone 75 mg pllt by Implant route.     ??? warfarin (COUMADIN) 5 mg tablet TAKE AS DIRECTED 90 Tab 3   ??? pneumococcal 13 val conj dip (PREVNAR 13, PF,) 0.5 mL syrg injection 0.5  mL by IntraMUSCular route once for 1 dose. 0.5 mL 0   ??? warfarin (COUMADIN) 1 mg tablet As directed 180 Tab 2   ??? gabapentin (NEURONTIN) 300 mg capsule Take 1 Cap by mouth two (2) times a day. 60 Cap 2   ??? lisinopril (PRINIVIL, ZESTRIL) 20 mg tablet TAKE ONE TABLET BY MOUTH ONCE DAILY 90 Tab 3   ??? tamsulosin (FLOMAX) 0.4 mg capsule Take 1 Cap by mouth daily. 90 Cap 3   ??? desonide (DESOWEN) 0.05 % topical ointment Apply  to affected area two (2) times a day. 15 g 2   ??? fexofenadine (ALLEGRA) 180 mg tablet Take  by mouth daily.     ??? multivitamin (ONE A DAY) tablet Take 1 Tab by mouth daily.       No Known Allergies  Family History   Problem Relation Age of Onset   ??? Arthritis-osteo Mother    ??? Hypertension Mother    ??? Arthritis-osteo Father    ??? Arthritis-osteo Brother    ??? Cancer Paternal Uncle      unknown   ??? Cancer Paternal Uncle      stomach   ??? Diabetes Paternal Uncle      Social History  Substance Use Topics   ??? Smoking status: Former Smoker     Packs/day: 0.50     Years: 20.00     Types: Cigarettes     Quit date: 11/30/1984   ??? Smokeless tobacco: Never Used   ??? Alcohol use Yes      Comment: 3 glasses of wine "couple of drinks"      Patient Active Problem List   Diagnosis Code   ??? 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 Anticoagulated on Coumadin Z51.81, Z79.01   ??? Dyslipidemia E78.5   ??? Erectile dysfunction N52.9   ??? Incomplete bladder emptying R33.9   ??? Benign non-nodular prostatic hyperplasia with lower urinary tract symptoms N40.1         Depression Risk Factor Screening:     PHQ over the last two weeks 11/26/2015   Little interest or pleasure in doing things Not at all   Feeling down, depressed or hopeless Not at all   Total Score PHQ 2 0     Alcohol Risk Factor Screening:   On any occasion during the past 3 months, have you had more than 4 drinks containing alcohol?  No    Do you average more than 14 drinks per week?  No    Functional Ability and Level of Safety:     Hearing Loss   None       Activities of Daily Living   Self-care.   Requires assistance with: no ADLs    Fall Risk     Fall Risk Assessment, last 12 mths 11/26/2015   Able to walk? Yes   Fall in past 12 months? Yes   Fall with injury? No   Number of falls in past 12 months 1   Fall Risk Score 1     Abuse Screen   Patient is not abused    Review of Systems   A thorough 10 point review of systems was done and was unremarkable except for HPI and chronic medical conditions.         Physical Examination     Visit Vitals   ??? BP 120/72   ??? Pulse 85   ??? Temp 98 ??F (36.7 ??C) (Temporal)   ??? Resp 18   ??? Ht 5\' 10"  (1.778 m)   ??? Wt 221 lb (100.2 kg)   ??? SpO2 98%   ??? BMI 31.71 kg/m2       Evaluation of Cognitive Function:  Mood/affect:  happy  Appearance: age appropriate      Nursing note and vitals reviewed.  Constitutional: He is oriented to person, place, and time. He appears well-developed and well-nourished. No distress.   HENT: ??  Head: Normocephalic and atraumatic.   Right Ear: External ear normal.   Left Ear: External ear normal. ??  Nose: Nose normal. ??  Mouth/Throat: Oropharynx is clear and moist. No oropharyngeal exudate.   Eyes: Conjunctivae are normal. Pupils are equal, round, and reactive to light. No scleral icterus.   Neck: Normal range of motion. Neck supple. No JVD present. No tracheal deviation present.   Cardiovascular: Normal rate, regular rhythm, normal heart sounds and intact distal pulses.?? Exam reveals no gallop and no friction rub.?? ??  No murmur heard.  Pulmonary/Chest: Effort normal and breath sounds normal. No respiratory distress.   Abdominal: Soft. Bowel sounds are normal. He exhibits no distension and no mass. There is no tenderness.   Musculoskeletal: Normal range of motion. He exhibits trace LE edema. Pulses intact and  symmetrical.?? Pain with palpation of bilateral Sacroiliac joint. Negative straight leg and FABRE???? ??  Lymphadenopathy:   ?? He has no cervical adenopathy.    Neurological: He is alert and oriented to person, place, and time.   Skin: Skin is warm and dry. He has bilateral hemosiderin pigmentation, with no leg hair, and ulcerating rashes on LE being followed by Derm, without evidence of cellulitis.   Erythematous rash on extremities   Psychiatric: He has a normal mood and affect. His behavior is normal.         Patient Care Team:  Donnella BiLauren E Haniya Fern, DO as PCP - General (Family Practice)    Advice/Referrals/Counseling   Education and counseling provided:  Are appropriate based on today's review and evaluation  ACP discussed     Body mass index is 31.71 kg/(m^2).  I have reviewed/discussed the above normal BMI with the patient.  I have recommended the following interventions: dietary management education, guidance, and counseling . Marland Kitchen.      Assessment/Plan       ICD-10-CM ICD-9-CM    1. Routine general medical examination at a health care facility Z00.00 V70.0    2. Dyslipidemia E78.5 272.4    3. Benign essential HTN I10 401.1    4. Screening for alcoholism Z13.89 V79.1    5. Encounter for immunization Z23 V03.89 pneumococcal 13 val conj dip (PREVNAR 13, PF,) 0.5 mL syrg injection   6. Screening for depression Z13.89 V79.0 DEPRESSION SCREEN ANNUAL     Follow-up Disposition:  1 year   lab results and schedule of future lab studies reviewed with patient  reviewed diet, exercise and weight control  very strongly urged to quit smoking to reduce cardiovascular risk  cardiovascular risk and specific lipid/LDL goals reviewed  reviewed medications and side effects in detail  use of aspirin to prevent MI and TIA's discussed  radiology results and schedule of future radiology studies reviewed with patient.    I have discussed the above plan with the patient. Patient has also been given instructions and information on his conditions.  Patient is aware of all possible side effects and agrees with the above mentioned plan.       Donnella BiLauren E Leandre Wien, DO

## 2015-11-26 NOTE — Patient Instructions (Addendum)
[facility]  Office working hours are Monday - Friday 8:00AM-5:00PM.  Office phone number is [phone] and fax is [phone]  For non-emergent medical care and clinical advice during office hours:   1. Call office or   2.  Send message or request using MyChart  For non-emergent medical care and clinical advice after office hours:  1.  Send message or request using MyChart   2.  Call [phone] and leave a message with our answering service or    Emergency care can be obtained at the [facility] ER, Urgent Care or by calling 911.    Patient Satisfaction Survey  As a valued patient, you will be receiving a survey from [employer].  We encourage you to share your thoughts and opinions about the care you received today. Thank you for choosing [facility]           BRING ALL MEDICATION BOTTLES TO EVERY APPOINTMENT    Medication Refills -   * Please do not let your medication run out before calling for a refill, the best practice would be to call when you have 2 weeks of medication left.  * Please allow 48 hours for medications to be called into your pharmacy when you request a medication refill      Have MyChart? Use it to request medication refills, it's quick and easy! If you have questions about MyChart please feel free to ask our staff!     Learning About Dietary Guidelines  What are the Dietary Guidelines for Americans?    Dietary Guidelines for Americans provide tips for eating well and staying healthy. This helps reduce the risk for long-term (chronic) diseases.  These adult guidelines from the [place] government recommend that you:  ?? Eat lots of fruits, vegetables, whole grains, and low-fat or nonfat dairy products.  ?? Try to balance your eating with your activity. This helps you stay at a healthy weight.  ?? Drink alcohol in moderation, if at all.  ?? Limit foods high in salt, saturated fat, trans fat, and added sugar.  What is MyPlate?   MyPlate is the [place] government's food guide. It can help you make your own well-balanced eating plan. A balanced eating plan means that you eat enough, but not too much, and that your food gives you the nutrients you need to stay healthy.  MyPlate focuses on eating plenty of whole grains, fruits, and vegetables, and on limiting fat and sugar. It is available online at www.ChooseMyPlate.gov.  How can you get started?  MyPlate suggests that most adults eat certain amounts from the different food groups:  Grains  Eat 5 to 8 ounces of grains each day. Half of those should be whole grains. Choose whole-grain breads, cold and cooked cereals and grains, pasta (without creamy sauces), hard rolls, or low-fat or fat-free crackers.  Vegetables  Eat 2 to 3 cups of vegetables every day. They contain little if any fat. And they have lots of nutrients that help protect against heart disease.  Fruits  Eat 1?? to 2 cups of fruits every day. Fruits contain very little fat but lots of nutrients.  Protein foods  Most adults need 5 to 6?? ounces each day. Choose fish and lean poultry more often. Eat red meat and fried meats less often. Dried beans, tofu, and nuts are also good sources of protein.  Dairy  Most adults need 3 cups of milk and milk products a day. Choose low-fat or fat-free products from   this food group. If you have problems digesting milk, try eating cheese or yogurt instead.  Limit fats and oils, including those used in cooking. When you do use fats, choose oils that are liquid at room temperature (unsaturated fats). These include canola oil and olive oil. Avoid foods with trans fats, such as many fried foods, cookies, and snack foods.  Where can you learn more?  Go to http://www.healthwise.net/GoodHelpConnections.  Enter D676 in the search box to learn more about "Learning About Dietary Guidelines."  Current as of: January 20, 2015  Content Version: 11.2  ?? 2006-2017 [employer]wise[employer]. Care instructions adapted under  license by [employer] (which disclaims liability or warranty for this information). If you have questions about a medical condition or this instruction, always ask your healthcare professional. Healthwise, Incorporated disclaims any warranty or liability for your use of this information.

## 2015-12-01 ENCOUNTER — Ambulatory Visit: Admit: 2015-12-01 | Discharge: 2015-12-01 | Payer: MEDICARE | Attending: Family Medicine | Primary: Family Medicine

## 2015-12-01 DIAGNOSIS — G8929 Other chronic pain: Secondary | ICD-10-CM

## 2015-12-01 NOTE — Patient Instructions (Signed)
[facility]  Office working hours are Monday - Friday 8:00AM-5:00PM.  Office phone number is [phone] and fax is [phone]  For non-emergent medical care and clinical advice during office hours:   1. Call office or   2.  Send message or request using MyChart  For non-emergent medical care and clinical advice after office hours:  1.  Send message or request using MyChart   2.  Call [phone] and leave a message with our answering service or    Emergency care can be obtained at the [facility] ER, Urgent Care or by calling 911.    Patient Satisfaction Survey  As a valued patient, you will be receiving a survey from [employer].  We encourage you to share your thoughts and opinions about the care you received today. Thank you for choosing [facility]           BRING ALL MEDICATION BOTTLES TO EVERY APPOINTMENT    Medication Refills -   * Please do not let your medication run out before calling for a refill, the best practice would be to call when you have 2 weeks of medication left.  * Please allow 48 hours for medications to be called into your pharmacy when you request a medication refill      Have MyChart? Use it to request medication refills, it's quick and easy! If you have questions about MyChart please feel free to ask our staff!

## 2015-12-01 NOTE — Progress Notes (Signed)
HISTORY OF PRESENT ILLNESS  Julian Bailey is a 70 y.o. male.     has a past medical history of Arthritis; BPH (benign prostatic hyperplasia); Cervical disc disease; DVT (deep venous thrombosis) (HCC) (x 2 2009  and 2011 ); ED (erectile dysfunction); Hypertension; and Thromboembolus (HCC).    Chief Complaint   Patient presents with   ??? Follow-up       HPI  The patient also presents to the office today for right knee pain - requests second injection . Complains of pain  mainly both lateral and medial anterior. It has at times caused a mild limp. There has been no swelling and no bruising. The symptoms had progressed to a point and then plateaued. It is described as a nagging ache. He does have intermittent Mobic, however, the patient takes this very sparingly as he is anticoagulated on steroids. Discussed rest, ice, compression, elevation, and exercise hand outs given today. He has held coumadin x 4 days.     ROS  A thorough 10 point review of systems was done and was unremarkable except for HPI and chronic medical conditions.     Past Medical History:   Diagnosis Date   ??? Arthritis    ??? BPH (benign prostatic hyperplasia)    ??? Cervical disc disease    ??? DVT (deep venous thrombosis) (HCC) x 2 2009  and 2011     after knee surgery then after long car ride    ??? ED (erectile dysfunction)    ??? Hypertension    ??? Thromboembolus [facility])      Past Surgical History:   Procedure Laterality Date   ??? HX ENDOSCOPY      colonoscopy last was in 2008    ??? 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 HX ORTHOPAEDIC       Family History   Problem Relation Age of Onset   ??? Arthritis-osteo Mother    ??? Hypertension Mother    ??? Arthritis-osteo Father    ??? Arthritis-osteo Brother    ??? Cancer Paternal Uncle      unknown   ??? Cancer Paternal Uncle      stomach   ??? Diabetes Paternal Uncle      Social History     Social History   ??? Marital status: MARRIED     Spouse name: N/A   ??? Number of children: N/A   ??? Years of education: N/A      Occupational History   ??? Not on file.     Social History Main Topics   ??? Smoking status: Former Smoker     Packs/day: 0.50     Years: 20.00     Types: Cigarettes     Quit date: 11/30/1984   ??? Smokeless tobacco: Never Used   ??? Alcohol use Yes      Comment: 3 glasses of wine "couple of drinks"    ??? Drug use: No   ??? Sexual activity: Yes     Partners: Female     Other Topics Concern   ??? Blood Transfusions No     Social History Narrative    Medicare wellness 09/23/13      No Known Allergies  Current Outpatient Prescriptions on File Prior to Visit   Medication Sig Dispense Refill   ??? tamsulosin (FLOMAX) 0.4 mg capsule Take 1 Cap by mouth daily. 90 Cap 3   ??? hydrocortisone valerate (WESTCORT) 0.2 % topical cream Apply  to affected area two (2) times a day. use thin layer     ??? testosterone 75 mg pllt by Implant route.     ??? warfarin (COUMADIN) 5 mg tablet TAKE AS DIRECTED 90 Tab 3   ??? warfarin (COUMADIN) 1 mg tablet As directed 180 Tab 2   ??? gabapentin (NEURONTIN) 300 mg capsule Take 1 Cap by mouth two (2) times a day. 60 Cap 2   ??? lisinopril (PRINIVIL, ZESTRIL) 20 mg tablet TAKE ONE TABLET BY MOUTH ONCE DAILY 90 Tab 3   ??? desonide (DESOWEN) 0.05 % topical ointment Apply  to affected area two (2) times a day. 15 g 2   ??? fexofenadine (ALLEGRA) 180 mg tablet Take  by mouth daily.     ??? multivitamin (ONE A DAY) tablet Take 1 Tab by mouth daily.       No current facility-administered medications on file prior to visit.        Vitals:    12/01/15 0809   Resp: 18   Temp: 98 ??F (36.7 ??C)   TempSrc: Temporal   SpO2: 98%   Weight: 221 lb (100.2 kg)   Height: 5\' 10"  (1.778 m)   .  Body mass index is 31.71 kg/(m^2).        Physical Exam   Musculoskeletal:        Right knee: He exhibits decreased range of motion and swelling. He exhibits no effusion, no ecchymosis, no deformity, no laceration, no erythema, normal alignment and no LCL laxity. Tenderness found. Medial joint line and lateral joint line tenderness noted.        Legs:     JOINT INJECTION  Discussed treatment options with patient with benefits, risk, side effects, and possible complications.  Questions answered.   Patient was placed in supine position with head at a 45 degrees.  Pt's knee was placed in extension.  The skin over the upper quadrant of the knee was cleaned with Betadine.  Identity of the patella was determined by palpation.  The ideal location to enter the synovial fluid sac was located via palpation.  Using a  3 cc syringe, a mix  Kenalog 40mg /ml and Lidocaine 10 mg/ml (total 2 ml) was drawn up.  Using a 23 gauge 1 1/2 needle, the synovial fluid sac was entered, and steroid solution was injected.  The needle was withdrawn and pressure was applied to the site. The site was covered with a simple bandage.  Pt tolerated injection well.   Discussed home joint care.        ASSESSMENT and PLAN    ICD-10-CM ICD-9-CM    1. Chronic pain of right knee M25.561 719.46 PR DRAIN/INJECT LARGE JOINT/BURSA    G89.29 338.29 XR KNEE RT MIN 4 V   Further imaging and pt if this does not resolve the pain.     Follow-up Disposition: PRN   reviewed diet, exercise and weight control  cardiovascular risk and specific lipid/LDL goals reviewed  reviewed medications and side effects in detail  radiology results and schedule of future radiology studies reviewed with patient    I have discussed the above plan with the patient. Patient has also been given instructions and information on his conditions.  Patient is aware of all possible side effects and agrees with the above mentioned plan.     Donnella BiLauren E Illiana Losurdo, DO

## 2015-12-10 ENCOUNTER — Inpatient Hospital Stay: Admit: 2015-12-10 | Payer: MEDICARE | Primary: Family Medicine

## 2015-12-10 DIAGNOSIS — M25561 Pain in right knee: Secondary | ICD-10-CM

## 2015-12-10 NOTE — Progress Notes (Signed)
Left message for patient to return call to the office regarding test results.

## 2015-12-10 NOTE — Progress Notes (Signed)
Did injection help?  Julian [name], DO

## 2015-12-11 NOTE — Progress Notes (Signed)
Xray shows moderate arthritis so injection will likely not be curative rather help with symptoms   Ideally for 3-4 months and then would be able to have one again  If he would like I can refer to ortho to see if surgical intervention is an option   Donnella BiLauren E Joshua Soulier, DO

## 2015-12-11 NOTE — Addendum Note (Signed)
Addended by: Donnella BiMILLER, Laketia Vicknair E on: 12/11/2015 03:40 PM      Modules accepted: Orders

## 2015-12-11 NOTE — Progress Notes (Signed)
Message  Received: Today ??  ?? Patton SallesShelby N Bentley, Certified Medical Assistant  Donnella BiLauren E Brayson Livesey, DO ??  ??     ??   ??  ?? Pt called back to ask that you please go ahead and refer him to Dr. [name] for evaluation of knee.      DONE  Donnella BiLauren E Avrie Kedzierski, DO

## 2015-12-15 ENCOUNTER — Encounter: Attending: Urology | Primary: Family Medicine

## 2016-03-16 ENCOUNTER — Ambulatory Visit: Admit: 2016-03-16 | Discharge: 2016-03-16 | Payer: MEDICARE | Attending: Family Medicine | Primary: Family Medicine

## 2016-03-16 ENCOUNTER — Inpatient Hospital Stay: Admit: 2016-03-16 | Payer: MEDICARE | Primary: Family Medicine

## 2016-03-16 DIAGNOSIS — I10 Essential (primary) hypertension: Secondary | ICD-10-CM

## 2016-03-16 LAB — CBC WITH AUTOMATED DIFF
ABS. BASOPHILS: 0 10*3/uL (ref 0.0–0.1)
ABS. EOSINOPHILS: 0.2 10*3/uL (ref 0.0–0.5)
ABS. LYMPHOCYTES: 0.7 10*3/uL — ABNORMAL LOW (ref 0.8–3.5)
ABS. MONOCYTES: 0.3 10*3/uL — ABNORMAL LOW (ref 0.8–3.5)
ABS. NEUTROPHILS: 3.9 10*3/uL (ref 1.5–8.0)
BASOPHILS: 0 % (ref 0–2)
EOSINOPHILS: 4 % (ref 0–5)
HCT: 41.8 % (ref 41–53)
HGB: 14.6 g/dL (ref 13.5–17.5)
LYMPHOCYTES: 14 % — ABNORMAL LOW (ref 19–48)
MCH: 31.6 PG — ABNORMAL HIGH (ref 27–31)
MCHC: 34.9 g/dL (ref 31–37)
MCV: 90.5 FL (ref 78–98)
MONOCYTES: 6 % (ref 3–9)
MPV: 10.4 FL — ABNORMAL HIGH (ref 5.9–10.3)
NEUTROPHILS: 76 % — ABNORMAL HIGH (ref 40–74)
PLATELET: 116 10*3/uL — ABNORMAL LOW (ref 130–400)
RBC: 4.62 M/uL — ABNORMAL LOW (ref 4.7–6.1)
RDW: 14 % (ref 11.5–14.5)
WBC: 5.1 10*3/uL (ref 4.5–10.8)

## 2016-03-16 LAB — METABOLIC PANEL, COMPREHENSIVE
A-G Ratio: 1.6 (ref 1.2–2.2)
ALT (SGPT): 44 U/L (ref 12–78)
AST (SGOT): 31 U/L (ref 15–37)
Albumin: 4.3 g/dL (ref 3.4–5.0)
Alk. phosphatase: 70 U/L (ref 45–117)
Anion gap: 8 mmol/L (ref 6–15)
BUN/Creatinine ratio: 15 (ref 7–25)
BUN: 14 MG/DL (ref 7–18)
Bilirubin, total: 1.8 MG/DL — ABNORMAL HIGH (ref <1.1)
CO2: 29 mmol/L (ref 21–32)
Calcium: 9.5 MG/DL (ref 8.5–10.1)
Chloride: 105 mmol/L (ref 98–107)
Creatinine: 0.91 MG/DL (ref 0.60–1.30)
GFR est AA: >60 mL/min/{1.73_m2} (ref >60)
GFR est non-AA: >60 mL/min/{1.73_m2} (ref >60)
Globulin: 2.7 g/dL (ref 2.4–3.5)
Glucose: 84 mg/dL (ref 70–110)
Potassium: 4.5 mmol/L (ref 3.5–5.3)
Protein, total: 7 g/dL (ref 6.4–8.2)
Sodium: 142 mmol/L (ref 136–145)

## 2016-03-16 LAB — LIPID PANEL
Cholesterol, total: 147 MG/DL (ref <200)
HDL Cholesterol: 37 MG/DL (ref 32–96)
LDL, calculated: 83.76 MG/DL (ref <130)
Triglyceride: 164 MG/DL — ABNORMAL HIGH (ref <150)
VLDL, calculated: 32.8 MG/DL — ABNORMAL HIGH (ref 5–32)

## 2016-03-16 LAB — TSH 3RD GENERATION: TSH: 0.73 u[IU]/mL (ref 0.35–3.74)

## 2016-03-16 LAB — PSA SCREENING (SCREENING): Prostate Specific Ag: 2.4 ng/mL (ref 0.0–4.0)

## 2016-03-16 MED ORDER — PNEUMOCOCCAL 13-VAL CONJ VACCINE-DIP CRM (PF) 0.5 ML IM SYRINGE
0.5 mL | Freq: Once | INTRAMUSCULAR | 0 refills | Status: AC
Start: 2016-03-16 — End: 2016-03-16

## 2016-03-16 NOTE — Addendum Note (Signed)
Addended by: Tod Persia L on: 03/16/2016 12:07 PM      Modules accepted: Orders, SmartSet

## 2016-03-16 NOTE — Patient Instructions (Signed)
[facility]  Office working hours are Monday - Friday 8:00AM-5:00PM.  Office phone number is [phone] and fax is [phone]  For non-emergent medical care and clinical advice during office hours:   1. Call office or   2.  Send message or request using MyChart  For non-emergent medical care and clinical advice after office hours:  1.  Send message or request using MyChart   2.  Call [phone] and leave a message with our answering service or    Emergency care can be obtained at the [facility] ER, Urgent Care or by calling 911.    Patient Satisfaction Survey  As a valued patient, you will be receiving a survey from [employer].  We encourage you to share your thoughts and opinions about the care you received today. Thank you for choosing [facility]           BRING ALL MEDICATION BOTTLES TO EVERY APPOINTMENT    Medication Refills -   * Please do not let your medication run out before calling for a refill, the best practice would be to call when you have 2 weeks of medication left.  * Please allow 48 hours for medications to be called into your pharmacy when you request a medication refill      Have MyChart? Use it to request medication refills, it's quick and easy! If you have questions about MyChart please feel free to ask our staff!

## 2016-03-16 NOTE — Progress Notes (Signed)
HISTORY OF PRESENT ILLNESS  Julian BottcherGregory Earl Bailey is a 70 y.o. male.    has a past medical history of Arthritis; BPH (benign prostatic hyperplasia); Cervical disc disease; DVT (deep venous thrombosis) (HCC) (x 2 2009  and 2011 ); ED (erectile dysfunction); Hypertension; and Thromboembolus (HCC).    Chief Complaint   Patient presents with   ??? Follow Up Chronic Condition     3 month follow up -FLU SHOT TODAY         HPI  The patient is a very pleasant 70 -year-old white male with the above listed past medical history who presents to the office today to follow up care.      The patient presents to follow up for longstanding hypertension. He has lost 20 lbs and stopped his lisinopril - initially cut 20 mg in half when BP started going low.   Pt is a  is taking BP at home has been well controlled. States its never higher than 135/83. Averages 125/80.   He denies any symptoms referable to hypertension. This would include but is not limited to headache, chest pain, shortness of breath, nausea, vomiting and neurological deficits.    BP Readings from Last 3 Encounters:   03/16/16 132/78   12/01/15 120/70   11/26/15 120/72       The patient presents to follow up for chronic anticoagulation on Coumadin.  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  The patient takes his PT and INR at home and the numbers are called or faxed to the office.  He has had stable levels for several months.  He is doing well, no signs of bleeding, no changes in diet, no recent illness.       The patient also presents to follow up today for longstanding elevated cholesterol.  He has not had his cholesterol checked in several months.  He has been trying to follow a diet closely.  About to have back and knee surgery so not exercising as much - but very careful with diet. Marland Kitchen.  He is trying to be more active.    Overall the patient states the is doing quite well.  Discussed the  patient's above normal BMI with him. Body mass index is 29.56 kg/(m^2).    I have recommended the following interventions: weight loss from baseline weight .  The BMI follow up plan is as follows: BMI is out of normal parameters and plan is as follows: I have counseled this patient on diet and exercise regimens      Wt Readings from Last 3 Encounters:   03/16/16 206 lb (93.4 kg)   12/01/15 221 lb (100.2 kg)   11/26/15 221 lb (100.2 kg)       Lab Results   Component Value Date/Time    Cholesterol, total 161 09/04/2015 09:00 AM    HDL Cholesterol 38 09/04/2015 09:00 AM    LDL, calculated 99.64 09/04/2015 09:00 AM    VLDL, calculated 29.2 09/04/2015 09:00 AM    Triglyceride 146 09/04/2015 09:00 AM       The patient does not have known history of coronary artery disease.      Sees Dr. Omer Jackefade for ED and BPH - Needs PSA   Lab Results   Component Value Date/Time    Prostate Specific Ag 2.9 12/09/2014 08:40 AM    Prostate Specific Ag 2.2 09/23/2013 09:35 AM    Prostate Specific Ag 3.8 02/29/2012 08:55 AM  Preventative Health  Requests flu shot  flu shot today --  Needs Prevnar 13                   Lab Results   Component Value Date/Time    Prostate Specific Ag 2.9 12/09/2014 08:40 AM    Prostate Specific Ag 2.2 09/23/2013 09:35 AM    Prostate Specific Ag 3.8 02/29/2012 08:55 AM       Review of Systems   Constitutional: Negative for fever, chills, weight loss, malaise/fatigue and diaphoresis.   HENT: Negative for congestion and sore throat.    Eyes: Negative for blurred vision and photophobia.   Respiratory: Negative for cough and shortness of breath.    Cardiovascular: Negative for chest pain, palpitations and leg swelling.   Gastrointestinal: Negative for heartburn, nausea, vomiting, abdominal pain, diarrhea, constipation, blood in stool and melena.   Genitourinary: Negative for dysuria.   Musculoskeletal: Negative for myalgias. +joint pain   Skin: Postive for rash- Seeing Dr. Melrose Nakayama -- recurrent  Contact  Dermatitis    Neurological: Negative for dizziness, loss of consciousness, weakness and headaches.   Psychiatric/Behavioral: Negative.      Past Medical History:   Diagnosis Date   ??? Arthritis    ??? BPH (benign prostatic hyperplasia)    ??? Cervical disc disease    ??? DVT (deep venous thrombosis) (HCC) x 2 2009  and 2011     after knee surgery then after long car ride    ??? ED (erectile dysfunction)    ??? Hypertension    ??? Thromboembolus [facility])      Past Surgical History:   Procedure Laterality Date   ??? HX ENDOSCOPY      colonoscopy last was in 2008    ??? 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 HX ORTHOPAEDIC       Family History   Problem Relation Age of Onset   ??? Arthritis-osteo Mother    ??? Hypertension Mother    ??? Arthritis-osteo Father    ??? Arthritis-osteo Brother    ??? Cancer Paternal Uncle      unknown   ??? Cancer Paternal Uncle      stomach   ??? Diabetes Paternal Uncle      Social History     Social History   ??? Marital status: MARRIED     Spouse name: N/A   ??? Number of children: N/A   ??? Years of education: N/A     Occupational History   ??? Not on file.     Social History Main Topics   ??? Smoking status: Former Smoker     Packs/day: 0.50     Years: 20.00     Types: Cigarettes     Quit date: 11/30/1984   ??? Smokeless tobacco: Never Used   ??? Alcohol use Yes      Comment: 3 glasses of wine "couple of drinks"    ??? Drug use: No   ??? Sexual activity: Yes     Partners: Female     Other Topics Concern   ??? Blood Transfusions No     Social History Narrative    Medicare wellness 09/23/13      No Known Allergies  Current Outpatient Prescriptions on File Prior to Visit   Medication Sig Dispense Refill   ??? tamsulosin (FLOMAX) 0.4 mg capsule Take 1 Cap by mouth daily. 90 Cap 3   ??? hydrocortisone valerate (WESTCORT) 0.2 % topical cream  Apply  to affected area two (2) times a day. use thin layer     ??? testosterone 75 mg pllt by Implant route.     ??? warfarin (COUMADIN) 5 mg tablet TAKE AS DIRECTED 90 Tab 3    ??? multivitamin (ONE A DAY) tablet Take 1 Tab by mouth daily.     ??? warfarin (COUMADIN) 1 mg tablet As directed 180 Tab 2   ??? fexofenadine (ALLEGRA) 180 mg tablet Take  by mouth daily.       No current facility-administered medications on file prior to visit.        Vitals:    03/16/16 0932   BP: 132/78   Pulse: 75   Resp: 18   Temp: 98.6 ??F (37 ??C)   TempSrc: Oral   SpO2: 99%   Weight: 206 lb (93.4 kg)   Height: 5\' 10"  (1.778 m)   .  Body mass index is 29.56 kg/(m^2).            Physical Exam   Nursing note and vitals reviewed.  Constitutional: He is oriented to person, place, and time. He appears well-developed and well-nourished. No distress.   HENT:   Head: Normocephalic and atraumatic.   Right Ear: External ear normal.   Left Ear: External ear normal.   Nose: Nose normal.   Mouth/Throat: Oropharynx is clear and moist. No oropharyngeal exudate.   Eyes: Conjunctivae are normal. Pupils are equal, round, and reactive to light. No scleral icterus.   Neck: Normal range of motion. Neck supple. No JVD present. No tracheal deviation present.   Cardiovascular: Normal rate, regular rhythm, normal heart sounds and intact distal pulses.  Exam reveals no gallop and no friction rub.    No murmur heard.  Pulmonary/Chest: Effort normal and breath sounds normal. No respiratory distress.   Abdominal: Soft. Bowel sounds are normal. He exhibits no distension and no mass. There is no tenderness.   Musculoskeletal:   Knee (r) crepitus - otherwise unremarkable   Lymphadenopathy:     He has no cervical adenopathy.   Neurological: He is alert and oriented to person, place, and time.   Skin: Skin is warm and dry. He has bilateral hemosiderin pigmentation, with no leg hair, and ulcerating rashes on LE being followed by Derm, without evidence of cellulitis.   Erythematous rash on extremities   Psychiatric: He has a normal mood and affect. His behavior is normal.       ASSESSMENT and PLAN    ICD-10-CM ICD-9-CM     1. Benign essential HTN I10 401.1 CBC WITH AUTOMATED DIFF      METABOLIC PANEL, COMPREHENSIVE      TSH 3RD GENERATION   2. Encounter for immunization Z23 V03.89 pneumococcal 13 val conj dip (PREVNAR-13) 0.5 mL syrg injection   3. Screening PSA (prostate specific antigen) Z12.5 V76.44 PSA SCREENING (SCREENING)   4. Dyslipidemia E78.5 272.4 LIPID PANEL   5. Thromboembolus (HCC) I74.9 444.9    6. Anticoagulated on Coumadin Z51.81 V58.83     Z79.01 V58.61    Continue coumadin   Continue with specialist    Follow-up Disposition: 6 months   lab results and schedule of future lab studies reviewed with patient  reviewed diet, exercise and weight control  cardiovascular risk and specific lipid/LDL goals reviewed  reviewed medications and side effects in detail      I have discussed the above plan with the patient. Patient has also been given instructions and information on his conditions.  Patient is aware of all possible side effects and agrees with the above mentioned plan.     Recommended sodium restriction. Reviewed diet, exercise and weight control.     Recommend 1800 cal low cholesterol (low fat) lean protein whole grain diet           Donnella Bi, DO

## 2016-03-16 NOTE — Addendum Note (Signed)
Addended by: Jonelle Sidle on: 03/16/2016 10:46 AM      Modules accepted: Orders

## 2016-03-17 NOTE — Progress Notes (Signed)
Send copy to [facility] ([facility] in [place])  Overall labs work looks good; there are a few labs that are outside the standard normal range, clinical significance doubtful

## 2016-03-17 NOTE — Progress Notes (Signed)
Patient notified, voiced understanding.  Faxed to [place] office.

## 2016-03-27 HISTORY — PX: REPLACEMENT TOTAL KNEE: SUR1224

## 2016-03-27 HISTORY — PX: LAMINECTOMY: SHX219

## 2016-04-14 ENCOUNTER — Inpatient Hospital Stay: Admit: 2016-04-14 | Payer: MEDICARE | Primary: Family Medicine

## 2016-04-14 DIAGNOSIS — M1711 Unilateral primary osteoarthritis, right knee: Secondary | ICD-10-CM | POA: Insufficient documentation

## 2016-04-14 DIAGNOSIS — Z01818 Encounter for other preprocedural examination: Secondary | ICD-10-CM

## 2016-04-14 LAB — EKG 12-LEAD
Atrial Rate: 66 {beats}/min
Diagnosis: NORMAL
P Axis: 36 degrees
P-R Interval: 142 ms
Q-T Interval: 360 ms
QRS Duration: 94 ms
QTc Calculation (Bazett): 377 ms
R Axis: -18 degrees
T Axis: 39 degrees
Ventricular Rate: 66 {beats}/min

## 2016-04-14 LAB — HEMOGLOBIN A1C WITH EAG: Hemoglobin A1c: 4.2 % (ref 4.2–6.3)

## 2016-04-14 LAB — CBC WITH AUTOMATED DIFF
ABS. BASOPHILS: 0 10*3/uL (ref 0.0–0.1)
ABS. EOSINOPHILS: 0.1 10*3/uL (ref 0.0–0.5)
ABS. LYMPHOCYTES: 0.8 10*3/uL (ref 0.8–3.5)
ABS. MONOCYTES: 0.7 10*3/uL — ABNORMAL LOW (ref 0.8–3.5)
ABS. NEUTROPHILS: 5.7 10*3/uL (ref 1.5–8.0)
BASOPHILS: 0 % (ref 0–2)
EOSINOPHILS: 1 % (ref 0–5)
HCT: 45.5 % (ref 41–53)
HGB: 15.5 g/dL (ref 13.5–17.5)
LYMPHOCYTES: 11 % — ABNORMAL LOW (ref 19–48)
MCH: 32.1 PG — ABNORMAL HIGH (ref 27–31)
MCHC: 34.1 g/dL (ref 31–37)
MCV: 94.2 FL (ref 80–100)
MONOCYTES: 9 % (ref 3–9)
MPV: 10.3 FL (ref 5.9–10.3)
NEUTROPHILS: 79 % — ABNORMAL HIGH (ref 40–74)
PLATELET: 122 10*3/uL — ABNORMAL LOW (ref 130–400)
RBC: 4.83 M/uL (ref 4.7–6.1)
RDW: 14.3 % (ref 11.5–14.5)
WBC: 7.3 10*3/uL (ref 4.5–10.8)

## 2016-04-14 LAB — METABOLIC PANEL, BASIC
Anion gap: 7 mmol/L (ref 6–15)
BUN/Creatinine ratio: 16 (ref 7–25)
BUN: 14 MG/DL (ref 7–18)
CO2: 28 mmol/L (ref 21–32)
Calcium: 9.7 MG/DL (ref 8.5–10.1)
Chloride: 106 mmol/L (ref 98–107)
Creatinine: 0.88 MG/DL (ref 0.60–1.30)
GFR est AA: >60 mL/min/{1.73_m2} (ref >60)
GFR est non-AA: >60 mL/min/{1.73_m2} (ref >60)
Glucose: 88 mg/dL (ref 70–110)
Potassium: 4 mmol/L (ref 3.5–5.3)
Sodium: 141 mmol/L (ref 136–145)

## 2016-04-14 LAB — EKG, 12 LEAD, INITIAL
Atrial Rate: 66 {beats}/min
Calculated P Axis: 36 degrees
Calculated R Axis: -18 degrees
Calculated T Axis: 39 degrees
Diagnosis: NORMAL
P-R Interval: 142 ms
Q-T Interval: 360 ms
QRS Duration: 94 ms
QTC Calculation (Bezet): 377 ms
Ventricular Rate: 66 {beats}/min

## 2016-04-14 LAB — PROTHROMBIN TIME + INR
INR: 0.9 (ref 0.9–1.1)
Prothrombin time: 12.6 s (ref 11.4–15.1)

## 2016-04-14 LAB — MRSA SCREEN - PCR (NASAL): MRSA by PCR, Nasal: NEGATIVE

## 2016-04-14 NOTE — Progress Notes (Signed)
Patient attended joint education today.  Discussed preparation for joint procedure, expectations for day of surgery, and post-op care including possible complications and preventative measures.  Stressed importance of compliance with MRSA treatment protocol and fall prevention.  Donny Pique, RN CCM, met in class with pt and provided list of home health providers for review.

## 2016-04-14 NOTE — Progress Notes (Signed)
PT/INR 2.1 per faxed results

## 2016-04-14 NOTE — H&P (Signed)
>                            Patient:    Julian Bailey  Date of Birth:    1945/10/15  Date:                          04/14/2016 9:00 AM   Visit Type:                  Office Visit      This 70 year old male presents for right knee pain.    History of Present Illness:  1.  right knee pain   Severity level is 6.  The problem is changing in character.  Location: right knee.  The pain is aching and throbbing.  The pain is aggravated by bending and climbing stairs.  Associated symptoms include decreased mobility, joint tenderness, popping, swelling and weakness.            Past Medical History  (Reviewed, updated)    Disease Onset Date Comments   Blood clots     Osteoarthritis     Hypertension     Sleep apnea       Past Surgical History    Management Side Date Comments   knee right 06/2007    Cataract extraction      Knee replacement        Medications (active prior to today)    Medication Name Sig Desc Start Date Stop Date Refilled Elsewhere   tamsulosin ER 0.4 mg capsule,extended release 24 hr take 1 capsule by oral route  every day 1/2 hour following the same meal each day //   Y   warfarin 5 mg tablet take 1 tablet by oral route  every day //   Y   Medication Reconciliation  Medications reconciled today.    Allergies:  Ingredient Reaction Medication Name Comment   NO KNOWN ALLERGIES        Family History  (Reviewed, updated)    Relationship Family Member Name Deceased Age at Death Condition Onset Age Cause of Death       Family history of Osteoarthritis  N       Family history of Melanoma  N     SOCIAL HISTORY  (Reviewed, updated)  Smoking status: Former smoker.      CAFFEINE  The patient uses caffeine.  HOME ENVIRONMENT/SAFETY  The patient has not fallen in the last year.     REVIEW OF SYSTEMS  System Neg/Pos Details   Constitutional Negative Fatigue, fever and night sweats.   Eyes Negative Vision loss.   Respiratory Negative Cough and dyspnea.    Cardio Negative Chest pain, cyanosis and irregular heartbeat/palpitations.   GI Negative Constipation, diarrhea, nausea and vomiting.   GU Negative Dysuria and hematuria.   Endocrine Negative Cold intolerance and heat intolerance.   Neuro Negative Difficulty walking, dizziness and headache.   Integumentary Positive Swelling.   Integumentary Negative Rash.   MS Positive Decreased mobility, Joint tenderness, Popping, Weakness.   MS Negative Except as noted in HPI and chief complaint.   Hema/Lymph Negative Easy bleeding and easy bruising.   Allergic/Immuno Negative Environmental allergies and food allergies.         VITAL SIGNS    HEIGHT  Time ft in cm Last Measured Height Position %   8:27 AM 5.0 3.00 160.02 01/12/2016  WEIGHT/BSA/BMI  Time lb oz kg Context % BMI kg/m2 BSA m2   8:27 AM 203.00  92.079   35.96      TEMPERATURE/PULSE/RESPIRATION  Time Temp F Temp C Temp Site Pulse/min Pattern Resp/ min   8:27 AM 97.9 36.61         MEASURED BY  Time Measured by   8:27 AM Corinna Capra. Fouts     Physical Exam  Exam Findings Details   Knee ROM R * Active ROM - Flexion: 110 degrees, Extension: -5 degrees, Factors: pain, Description: Active painful ROM.   Respiratory Normal Inspection - Normal. Auscultation - Normal. Palpation - Normal. Percussion - Normal. Chest wall tenderness - Absent. Cough - Absent. Effort - Normal.   Cardiovascular Normal Inspection - JVD: Absent. Palpation/percussion - PMI normal. Heart rate - Regular rate. Rhythm - Regular. Heart sounds - Normal S1, Normal S2. Extra sounds - None. Murmurs - None. Extremities - No edema.   Abdomen * Obese.   Abdomen Normal Inspection - Normal. Anterior palpation - Normal. CVA tenderness - None. No abdominal tenderness.   Skin Normal Inspection - Normal. Palpation/texture - Normal. Nails - Normal. Hair - Normal.   Knee * Inspection - Gait: Limp. Alignment - Right: neutral. Ecchymosis - Right: negative. Effusion - Right: mild. Swelling - Right: mild. Maximum  tenderness - Right: Medial Joint Line. Patella exam - Crepitation - Right: mild. Patella position - Right: neutral.   Knee Normal Inspection - Atrophy - Right: Absent. Skin - Right: Normal. Patella exam - Apprehension - Right: Negative. Q-angle - Right: Normal. Posterior drawer - Right: Negative. Anterior drawer - Right: Negative.         Diagnostics:  Study Result/Report   X-RAY EXAM OF KNEE, 3 RT AP lateral and Zoila Shutter shows bone on bone lateral compartment arthritis with tibial sclerosis and osteophytes, patella femoral sclerosis and osteophytes     Completed Orders (this encounter)  Order Details Reason Side Interpretation Result Initial Treatment Date Region   X-RAY EXAM OF KNEE, 3 RT   RT  AP lateral and Zoila Shutter shows bone on bone lateral compartment arthritis with tibial sclerosis and osteophytes, patella femoral sclerosis and osteophytes     Giving encouragement to exercise          Lifestyle education regarding diet            Assessment/Plan  # Detail Type Description    1. 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    Patient Plan Mr. 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 Treatment includes NSAIDs, Ibuprofen 800mg  and Diclofenac 75mg  x3 months without improvement. He tried steroid injection by Dr. Hyacinth Meeker in June of this year that only lasted one week. He participated in physical therapy at [facility] without functional improvement. He has x-ray evidence on AP lateral and Rosenberg bone on bone lateral compartment arthritis with tibial sclerosis and osteophytes, patella femoral sclerosis and osteophytes. He has -5 to 110 degrees ROM on exam. Personal safety is compromised because of the pain and instability, he is afraid of falling. ADLs are limited, he has difficulty walking, standing, bathing, driving and with stairs. He uses a brace as needed for support. Medical history is  positive for co-morbid condition of high blood pressure, sleep apnea, and DVT. Patient education was provided today regarding joint disease and treatment options. The risks, benefits and alternatives were explained and  the patient agreed to proceed with right total knee arthroplasty. Rx given for a walker.    Plan Orders The patient had the following test(s) completed today X-RAY EXAM OF KNEE, 3 RT.         2. Assessment Body mass index (BMI) 35.0-35.9, adult (Z68.35).    Plan Orders  Today's instructions / counseling include(s) Lifestyle education regarding diet. Giving encouragement to exercise.              Fall Risk Plan  The patient has not fallen in the last year.      Transcribed by Philis NettleLisa Williams authenticated by Heide SparkJoseph Marionette Meskill        Document generated by:  Mancel BaleGretchen James 04/14/2016 12:42 PM  ----------------------------------------------------------------------------------------------------------------------------------------------------------------------

## 2016-04-14 NOTE — Other (Signed)
Name, date of birth, procedure verified with patient in PAT.

## 2016-04-18 ENCOUNTER — Inpatient Hospital Stay
Admit: 2016-04-18 | Discharge: 2016-04-20 | Disposition: A | Discharge status: Home Health | Payer: MEDICARE | Attending: Orthopaedic Surgery | Admitting: Orthopaedic Surgery

## 2016-04-18 ENCOUNTER — Inpatient Hospital Stay: Admit: 2016-04-18 | Payer: MEDICARE | Primary: Family Medicine

## 2016-04-18 DIAGNOSIS — M1711 Unilateral primary osteoarthritis, right knee: Secondary | ICD-10-CM

## 2016-04-18 LAB — URINE MICROSCOPIC

## 2016-04-18 LAB — URINALYSIS W/ RFLX MICROSCOPIC
Bilirubin: NEGATIVE
Blood: NEGATIVE
Glucose: NEGATIVE mg/dL
Ketone: NEGATIVE mg/dL
Leukocyte Esterase: NEGATIVE
Nitrites: NEGATIVE
Protein: NEGATIVE mg/dL
Specific gravity: 1.02 (ref 1.002–1.030)
Urobilinogen: 1 EU/dL (ref 0–1)
pH (UA): 7.5 (ref 4.5–8.0)

## 2016-04-18 MED ORDER — TRANEXAMIC ACID 1500 MG IN NS IRRIGATION SYRINGE
Status: DC | PRN
Start: 2016-04-18 — End: 2016-04-18
  Administered 2016-04-18: 21:00:00

## 2016-04-18 MED ORDER — SENNOSIDES 8.6 MG TAB
8.6 mg | Freq: Every day | ORAL | Status: DC
Start: 2016-04-18 — End: 2016-04-20
  Administered 2016-04-19 – 2016-04-20 (×2): via ORAL

## 2016-04-18 MED ORDER — LISINOPRIL 10 MG TAB
10 mg | Freq: Every day | ORAL | Status: DC
Start: 2016-04-18 — End: 2016-04-20
  Administered 2016-04-19 – 2016-04-20 (×2): via ORAL

## 2016-04-18 MED ORDER — MAGNESIUM HYDROXIDE 2,400 MG/10 ML ORAL SUSP
2400 mg/10 mL | Freq: Every day | ORAL | Status: DC | PRN
Start: 2016-04-18 — End: 2016-04-20

## 2016-04-18 MED ORDER — ADV ADDAPTOR
1 gram | Freq: Three times a day (TID) | Status: AC
Start: 2016-04-18 — End: 2016-04-19
  Administered 2016-04-19 (×2): via INTRAVENOUS

## 2016-04-18 MED ORDER — .PHARMACY TO SUBSTITUTE PER PROTOCOL
Status: DC | PRN
Start: 2016-04-18 — End: 2016-04-18

## 2016-04-18 MED ORDER — WARFARIN 5 MG TAB
5 mg | Freq: Every evening | ORAL | Status: DC
Start: 2016-04-18 — End: 2016-04-20
  Administered 2016-04-19 (×2): via ORAL

## 2016-04-18 MED ORDER — ENOXAPARIN 100 MG/ML SUB-Q SYRINGE
100 mg/mL | Freq: Two times a day (BID) | SUBCUTANEOUS | Status: DC
Start: 2016-04-18 — End: 2016-04-20
  Administered 2016-04-19 – 2016-04-20 (×4): via SUBCUTANEOUS

## 2016-04-18 MED ORDER — SODIUM CHLORIDE 0.9 % IV
INTRAVENOUS | Status: AC
Start: 2016-04-18 — End: ?

## 2016-04-18 MED ORDER — ONDANSETRON (PF) 4 MG/2 ML INJECTION
4 mg/2 mL | INTRAMUSCULAR | Status: DC | PRN
Start: 2016-04-18 — End: 2016-04-20

## 2016-04-18 MED ORDER — OXYCODONE 5 MG TAB
5 mg | ORAL | Status: DC | PRN
Start: 2016-04-18 — End: 2016-04-20
  Administered 2016-04-19 – 2016-04-20 (×3): via ORAL

## 2016-04-18 MED ORDER — WARFARIN 1 MG TAB
1 mg | Freq: Every evening | ORAL | Status: DC
Start: 2016-04-18 — End: 2016-04-18

## 2016-04-18 MED ORDER — NALOXONE 0.4 MG/ML INJECTION
0.4 mg/mL | INTRAMUSCULAR | Status: DC | PRN
Start: 2016-04-18 — End: 2016-04-20

## 2016-04-18 MED ORDER — HYDROMORPHONE 2 MG TAB
2 mg | ORAL | Status: DC | PRN
Start: 2016-04-18 — End: 2016-04-20
  Administered 2016-04-19 – 2016-04-20 (×6): via ORAL

## 2016-04-18 MED ORDER — SODIUM CHLORIDE 0.9 % IJ SYRG
INTRAMUSCULAR | Status: DC | PRN
Start: 2016-04-18 — End: 2016-04-18

## 2016-04-18 MED ORDER — EPINEPHRINE (PF) 1 MG/ML INJECTION
1 mg/mL ( mL) | INTRAMUSCULAR | Status: AC
Start: 2016-04-18 — End: ?

## 2016-04-18 MED ORDER — ROPIVACAINE (PF) 5 MG/ML (0.5 %) INJECTION
5 mg/mL (0. %) | INTRAMUSCULAR | Status: AC
Start: 2016-04-18 — End: ?

## 2016-04-18 MED ORDER — KETOROLAC TROMETHAMINE 30 MG/ML INJECTION
30 mg/mL (1 mL) | INTRAMUSCULAR | Status: DC | PRN
Start: 2016-04-18 — End: 2016-04-18
  Administered 2016-04-18: 21:00:00 via SUBCUTANEOUS

## 2016-04-18 MED ORDER — DEXTROSE 5%-1/2 NORMAL SALINE IV
INTRAVENOUS | Status: AC
Start: 2016-04-18 — End: 2016-04-19
  Administered 2016-04-18: 23:00:00 via INTRAVENOUS

## 2016-04-18 MED ORDER — METHYLPREDNISOLONE (PF) 125 MG/2 ML IJ SOLR
125 mg/2 mL | INTRAMUSCULAR | Status: AC
Start: 2016-04-18 — End: 2016-04-18
  Administered 2016-04-18: 19:00:00 via INTRAVENOUS

## 2016-04-18 MED ORDER — FENTANYL CITRATE (PF) 50 MCG/ML IJ SOLN
50 mcg/mL | INTRAMUSCULAR | Status: DC | PRN
Start: 2016-04-18 — End: 2016-04-18

## 2016-04-18 MED ORDER — TAMSULOSIN SR 0.4 MG 24 HR CAP
0.4 mg | Freq: Every day | ORAL | Status: DC
Start: 2016-04-18 — End: 2016-04-20
  Administered 2016-04-19 – 2016-04-20 (×2): via ORAL

## 2016-04-18 MED ORDER — CEFAZOLIN 2 GM/50 ML IN DEXTROSE (ISO-OSMOTIC) IVPB
2 gram/50 mL | INTRAVENOUS | Status: AC
Start: 2016-04-18 — End: 2016-04-18
  Administered 2016-04-18: 20:00:00 via INTRAVENOUS

## 2016-04-18 MED ORDER — ZOLPIDEM 5 MG TAB
5 mg | Freq: Every evening | ORAL | Status: DC | PRN
Start: 2016-04-18 — End: 2016-04-20

## 2016-04-18 MED ORDER — MIDAZOLAM 1 MG/ML IJ SOLN
1 mg/mL | Freq: Once | INTRAMUSCULAR | Status: AC
Start: 2016-04-18 — End: 2016-04-18
  Administered 2016-04-18: 19:00:00 via INTRAVENOUS

## 2016-04-18 MED ORDER — SODIUM CHLORIDE 0.9 % IRRIGATION SOLN
0.9 % | Status: AC
Start: 2016-04-18 — End: ?

## 2016-04-18 MED ORDER — SODIUM CHLORIDE 0.9 % IJ SYRG
INTRAMUSCULAR | Status: DC | PRN
Start: 2016-04-18 — End: 2016-04-20

## 2016-04-18 MED ORDER — EPINEPHRINE (PF) 1 MG/ML INJECTION
1 mg/mL ( mL) | INTRAMUSCULAR | Status: DC | PRN
Start: 2016-04-18 — End: 2016-04-18
  Administered 2016-04-18: 21:00:00 via SUBCUTANEOUS

## 2016-04-18 MED ORDER — WARFARIN 5 MG TAB
5 mg | Freq: Every evening | ORAL | Status: DC
Start: 2016-04-18 — End: 2016-04-18

## 2016-04-18 MED ORDER — SODIUM CHLORIDE 0.9 % IRRIGATION SOLN (ONE-STEP) - 3000 ML
0.9 % | Status: DC | PRN
Start: 2016-04-18 — End: 2016-04-18
  Administered 2016-04-18: 21:00:00

## 2016-04-18 MED ORDER — MIDAZOLAM 1 MG/ML IJ SOLN
1 mg/mL | INTRAMUSCULAR | Status: AC
Start: 2016-04-18 — End: ?

## 2016-04-18 MED ORDER — SODIUM CHLORIDE 0.9 % IJ SYRG
Freq: Three times a day (TID) | INTRAMUSCULAR | Status: DC
Start: 2016-04-18 — End: 2016-04-18

## 2016-04-18 MED ORDER — EPINEPHRINE (PF) 1 MG/ML INJECTION
1 mg/mL ( mL) | Freq: Once | INTRAMUSCULAR | Status: AC
Start: 2016-04-18 — End: 2016-04-18
  Administered 2016-04-18: 20:00:00 via SUBCUTANEOUS

## 2016-04-18 MED ORDER — SODIUM CHLORIDE 0.9 % IRRIGATION SOLN (ONE-STEP) - 1500 ML
0.9 % | Status: DC | PRN
Start: 2016-04-18 — End: 2016-04-18
  Administered 2016-04-18: 21:00:00

## 2016-04-18 MED ORDER — ONDANSETRON (PF) 4 MG/2 ML INJECTION
4 mg/2 mL | Freq: Once | INTRAMUSCULAR | Status: DC | PRN
Start: 2016-04-18 — End: 2016-04-18

## 2016-04-18 MED ORDER — DOCUSATE SODIUM 100 MG CAP
100 mg | Freq: Two times a day (BID) | ORAL | Status: DC
Start: 2016-04-18 — End: 2016-04-20
  Administered 2016-04-19 – 2016-04-20 (×4): via ORAL

## 2016-04-18 MED ORDER — BUPIVACAINE-DEXTROSE-WATER(PF) 7.5 MG/ML (0.75 %) (SPINAL) IJ SOLN
0.75 % (7.5 mg/mL) | INTRAMUSCULAR | Status: DC | PRN
Start: 2016-04-18 — End: 2016-04-18
  Administered 2016-04-18: 20:00:00 via INTRATHECAL

## 2016-04-18 MED ORDER — SODIUM CHLORIDE 0.9 % IV
INTRAVENOUS | Status: DC | PRN
Start: 2016-04-18 — End: 2016-04-18
  Administered 2016-04-18: 20:00:00 via INTRAVENOUS

## 2016-04-18 MED ORDER — SODIUM CHLORIDE 0.9 % IRRIGATION SOLN
0.9 % | Status: DC | PRN
Start: 2016-04-18 — End: 2016-04-18
  Administered 2016-04-18: 21:00:00

## 2016-04-18 MED ORDER — ROPIVACAINE (PF) 5 MG/ML (0.5 %) INJECTION
5 mg/mL (0. %) | INTRAMUSCULAR | Status: AC
Start: 2016-04-18 — End: 2016-04-18
  Administered 2016-04-18: 20:00:00 via PERINEURAL

## 2016-04-18 MED ORDER — PROPOFOL 10 MG/ML IV EMUL
10 mg/mL | INTRAVENOUS | Status: DC | PRN
Start: 2016-04-18 — End: 2016-04-18
  Administered 2016-04-18: 21:00:00 via INTRAVENOUS

## 2016-04-18 MED ORDER — DICLOFENAC 75 MG TAB, DELAYED RELEASE
75 mg | Freq: Two times a day (BID) | ORAL | Status: DC
Start: 2016-04-18 — End: 2016-04-20
  Administered 2016-04-19 – 2016-04-20 (×3): via ORAL

## 2016-04-18 MED ORDER — ROPIVACAINE (PF) 5 MG/ML (0.5 %) INJECTION
5 mg/mL (0. %) | INTRAMUSCULAR | Status: DC | PRN
Start: 2016-04-18 — End: 2016-04-18
  Administered 2016-04-18: 21:00:00 via SUBCUTANEOUS

## 2016-04-18 MED ORDER — MULTIVITAMIN TAB
Freq: Every day | ORAL | Status: DC
Start: 2016-04-18 — End: 2016-04-20
  Administered 2016-04-19 – 2016-04-20 (×2): via ORAL

## 2016-04-18 MED ORDER — KETOROLAC TROMETHAMINE 30 MG/ML INJECTION
30 mg/mL (1 mL) | INTRAMUSCULAR | Status: AC
Start: 2016-04-18 — End: ?

## 2016-04-18 MED ORDER — LACTATED RINGERS IV
INTRAVENOUS | Status: AC
Start: 2016-04-18 — End: 2016-04-19
  Administered 2016-04-18: 19:00:00 via INTRAVENOUS

## 2016-04-18 MED ORDER — FAMOTIDINE (PF) 20 MG/2 ML IV
20 mg/2 mL | Freq: Once | INTRAVENOUS | Status: AC
Start: 2016-04-18 — End: 2016-04-18
  Administered 2016-04-18: 19:00:00 via INTRAVENOUS

## 2016-04-18 MED ORDER — PROPOFOL 10 MG/ML IV EMUL
10 mg/mL | INTRAVENOUS | Status: AC
Start: 2016-04-18 — End: ?

## 2016-04-18 MED ORDER — DIPHENHYDRAMINE HCL 50 MG/ML IJ SOLN
50 mg/mL | INTRAMUSCULAR | Status: DC | PRN
Start: 2016-04-18 — End: 2016-04-20

## 2016-04-18 MED ORDER — ADV ADDAPTOR
1 gram | Freq: Once | Status: DC
Start: 2016-04-18 — End: 2016-04-18

## 2016-04-18 MED ORDER — MIDAZOLAM 1 MG/ML IJ SOLN
1 mg/mL | INTRAMUSCULAR | Status: DC | PRN
Start: 2016-04-18 — End: 2016-04-18
  Administered 2016-04-18 (×4): via INTRAVENOUS

## 2016-04-18 MED ORDER — ONDANSETRON (PF) 4 MG/2 ML INJECTION
4 mg/2 mL | Freq: Once | INTRAMUSCULAR | Status: AC
Start: 2016-04-18 — End: 2016-04-18
  Administered 2016-04-18: 19:00:00 via INTRAVENOUS

## 2016-04-18 MED ORDER — DICLOFENAC POTASSIUM 25 MG CAP
25 mg | Freq: Four times a day (QID) | ORAL | Status: DC
Start: 2016-04-18 — End: 2016-04-20
  Administered 2016-04-19 – 2016-04-20 (×6): via ORAL

## 2016-04-18 MED ORDER — SODIUM CHLORIDE 0.9 % IJ SYRG
Freq: Three times a day (TID) | INTRAMUSCULAR | Status: DC
Start: 2016-04-18 — End: 2016-04-20
  Administered 2016-04-18 – 2016-04-20 (×6): via INTRAVENOUS

## 2016-04-18 MED ORDER — ACETAMINOPHEN 325 MG TABLET
325 mg | ORAL | Status: DC | PRN
Start: 2016-04-18 — End: 2016-04-20

## 2016-04-18 MED FILL — CEFAZOLIN 2 GM/50 ML IN DEXTROSE (ISO-OSMOTIC) IVPB: 2 gram/50 mL | INTRAVENOUS | Qty: 50

## 2016-04-18 MED FILL — EPINEPHRINE (PF) 1 MG/ML INJECTION: 1 mg/mL ( mL) | INTRAMUSCULAR | Qty: 1

## 2016-04-18 MED FILL — DIPRIVAN 10 MG/ML INTRAVENOUS EMULSION: 10 mg/mL | INTRAVENOUS | Qty: 20

## 2016-04-18 MED FILL — NORMAL SALINE FLUSH 0.9 % INJECTION SYRINGE: INTRAMUSCULAR | Qty: 10

## 2016-04-18 MED FILL — KETOROLAC TROMETHAMINE 30 MG/ML INJECTION: 30 mg/mL (1 mL) | INTRAMUSCULAR | Qty: 1

## 2016-04-18 MED FILL — ONDANSETRON (PF) 4 MG/2 ML INJECTION: 4 mg/2 mL | INTRAMUSCULAR | Qty: 2

## 2016-04-18 MED FILL — MIDAZOLAM 1 MG/ML IJ SOLN: 1 mg/mL | INTRAMUSCULAR | Qty: 8

## 2016-04-18 MED FILL — SODIUM CHLORIDE 0.9 % IRRIGATION SOLN: 0.9 % | Qty: 2000

## 2016-04-18 MED FILL — SOLU-MEDROL (PF) 125 MG/2 ML SOLUTION FOR INJECTION: 125 mg/2 mL | INTRAMUSCULAR | Qty: 2

## 2016-04-18 MED FILL — NAROPIN (PF) 5 MG/ML (0.5 %) INJECTION SOLUTION: 5 mg/mL (0. %) | INTRAMUSCULAR | Qty: 30

## 2016-04-18 MED FILL — MIDAZOLAM 1 MG/ML IJ SOLN: 1 mg/mL | INTRAMUSCULAR | Qty: 2

## 2016-04-18 MED FILL — SODIUM CHLORIDE 0.9 % IV: INTRAVENOUS | Qty: 1000

## 2016-04-18 MED FILL — FAMOTIDINE (PF) 20 MG/2 ML IV: 20 mg/2 mL | INTRAVENOUS | Qty: 2

## 2016-04-18 MED FILL — LACTATED RINGERS IV: INTRAVENOUS | Qty: 1000

## 2016-04-18 MED FILL — SODIUM CHLORIDE 0.9 % IRRIGATION SOLN: 0.9 % | Qty: 1500

## 2016-04-18 NOTE — Anesthesia Procedure Notes (Signed)
Peripheral Block    Start time: 04/18/2016 3:38 PM  End time: 04/18/2016 3:41 PM  Performed by: Dian SituIYER, Nneoma Harral  Authorized by: Dian SituIYER, Said Rueb       Pre-procedure:   Indications: at surgeon's request and post-op pain management    Preanesthetic Checklist: patient identified, risks and benefits discussed, site marked, timeout performed, anesthesia consent given and patient being monitored      Block Type:   Block Type:  Femoral single shot  Laterality:  Right  Monitoring:  Standard ASA monitoring, frequent vital sign checks, responsive to questions, oxygen, continuous pulse ox and heart rate  Injection Technique:  Single shot  Procedures: nerve stimulator    Patient Position: supine  Prep: betadine and povidone-iodine 7.5% surgical scrub    Location:  Upper thigh  Needle Type:  Stimuplex  Needle Gauge:  22 G  Needle Localization:  Anatomical landmarks and nerve stimulator  Motor Response: minimal motor response >0.4 mA    Medication Injected:  0.5%  ropivacaine  Adds:  Epi 1:200K  Volume (mL):  30    Assessment:  Number of attempts:  1  Injection Assessment:  Incremental injection every 5 mL, no paresthesia, no intravascular symptoms and negative aspiration for blood  Patient tolerance:  Patient tolerated the procedure well with no immediate complications

## 2016-04-18 NOTE — Op Note (Signed)
[facility] [place] [facility]  OPERATIVE REPORT    Name:  SAWYER, MENTZER    MR #:   [id number]    Account #:  [id number]    DOB:  Mar 14, 1946    Age:  70Y    Location:  402    Date of Admission:  04/18/2016      OPERATIVE REPORT    SURGERY DATE: 04/18/2016    PREOPERATIVE DIAGNOSIS:  End stage arthritis right knee    POSTOPERATIVE DIAGNOSIS:  End stage arthritis right knee    OPERATION:  Right total knee arthroplasty using [employer] metal on plastic  on metal construct with a 70 CR femoral component, 79 fixed I-beam  tibial plate, 34 patella and a 16 mm bearing.     SURGEON:  Heide Spark, M.D.  ASSISTANT:  Emeline Darling, PA-C  ANESTHESIA:  [facility]   COMPLICATIONS:  None      INDICATIONS FOR PROCEDURE:   Mr. Caras is a 70 year old male with end stage arthritis of his  right knee.  He has been dealing with this for over four years.  He  has seen Dr. Hyacinth Meeker and myself for this pain.  He has pain with  walking, standing, going up and down stairs, bathing and driving.  He has been on ibuprofen 800 mg as well as Diclofenac without  improvement.  He has had intraarticular steroid injection by Dr.  Hyacinth Meeker which only lasted a week.  He has been using a brace as  needed.  He has done physical therapy without improvement.  Radiographically he had end stage arthritis with tibial and femoral  sclerosis and osteophyte formation, patellofemoral sclerosis and  osteophyte formation.  He is having a poor quality of life due to  constant right knee pain.  The risks, benefits and alternatives to  right total knee arthroplasty were discussed with the patient and he  wished to proceed.  Of note, he does have a history of DVT and I  advised him that we will defer to the hospitalist for ultimate  manage of this.  I will resume his Coumadin but will most likely use  Lovenox for bridge therapy.  He understood this and wished to  proceed.  We also discussed the risks of postoperative hematoma  and  bleeding due to Lovenox and Coumadin.  He felt his knee pain was too  severe not to have the knee arthroplasty performed.      DESCRIPTION OF PROCEDURE:   Following smooth induction of anesthesia, the patient's right lower  extremity was then prepped and draped in the usual sterile fashion.  Tourniquet was applied and insufflated to 350 mmHg.  A midline  incision was utilized.   A medial parapatellar arthrotomy was  performed.  Medial release was performed and fat pad was excised.  I placed collateral ligament retractors.  I made my intramedullary  drill hole and placed my distal femoral cutting guide. I pinned this  in place, performed my bone cuts and removed my extra bone.  Next, I  subluxed the tibia forward releasing the ACL.   I applied my  extramedullary tibial cutting guide and pinned this in place.  I  performed bone cuts and removed excess bone.  Next I drew out  Whiteside's line.  I sized the femur to be a 70 mm.  I made my drill  holes and placed my four-in-one cutting block.  I performed bone  cuts and removed excess bone.  Next, I removed the remainder of the  medial and lateral meniscus.   Next, I subluxed the tibia forward.  I sized this to be a 79.   I used my box osteotome and box punch.  I  placed my trial tibial component.   I flexed the knee and impacted  my femoral component.  I trial and found that a 16 mm bearing  provided excellent stability in varus and valgus testing at 0 and 30  degrees and good A to P stability.  I then made my drill holes for  the lugs.  I then removed all of these components.  I everted the  patella, measured thickness of the patella and performed a free hand  cut of the patella.  I sized it to be a 34.  I made my drill holes.  I placed my trial patella and restored appropriate anatomical  thickness.  I then thoroughly irrigated.  I did inject Toradol,  Epinephrine and Naropin.   I placed cement on the tibia and impacted  my tibial component.  I placed cement on  the femur and impacted my  femoral component.   I placed my trial bearing and brought the knee  into full extension.  I everted the patella, placed cement on the  patella, placed my patellar component and clamped this.  I then  applied warm saline and allowed the cement to cure.   Once cured I  trialed once more.  I had excellent stability with varus and valgus  stressing at 0 and 30 degrees and good A to P stability.  The  patella tracked nicely with the "No Thumbs" technique.   I removed  the trial polyethylene, placed my final polyethylene and placed my  locking bar.  I released the tourniquet.  I applied Tranexamic acid.  I closed the capsule with running 0 Quill and interrupted 0 Vicryl.  Subcutaneous tissue was closed with 2-0 Vicryl.  My PA closed the  skin with running 3-0 Monocryl and Dermabond Prineo System.  The  patient tolerated the procedure well.     I did utilize my physician assistant during this case for patient  positioning, prepping and draping, intraoperative retraction and leg  manipulation, wound closure and postoperative dressing application.  This greatly facilitated the procedure, minimizing morbidities for  the patient.         JL:ch DD: 04/18/2016 17:13:23  DT: 04/19/2016 10:40:02 Job ID:  [id number]  CC:

## 2016-04-18 NOTE — Anesthesia Procedure Notes (Signed)
Spinal Block    Start time: 04/18/2016 3:53 PM  End time: 04/18/2016 4:04 PM  Performed by: Denyse DagoSCOTT, CHRISTOPHER  Authorized by: Dian SituIYER, SRIRAM     Pre-procedure:  Indications: primary anesthetic  Preanesthetic Checklist: patient identified, risks and benefits discussed, anesthesia consent, site marked, patient being monitored and timeout performed    Timeout Time: 15:52          Spinal Block:   Patient Position:  Seated  Prep Region:  Lumbar  Prep: Betadine      Location:  L3-4  Technique:  Single shot  Local:  Lidocaine 1%  Local Dose (mL):  1    Needle:   Needle Type:  Pencan  Needle Gauge:  25 G  Attempts:  1      Events: CSF confirmed, no blood with aspiration and no paresthesia        Assessment:  Insertion:  Uncomplicated  Patient tolerance:  Patient tolerated the procedure well with no immediate complications

## 2016-04-18 NOTE — Consults (Signed)
H&P      Patient: Julian Bailey               Sex: male             MRN: [id number]      Date of Birth:  05-Oct-1945      Age:  70 y.o.               No chief complaint on file.      SHPI     Kerry Chisolm Landress is a 70 y.o. male. Medical service consult was placed for Medical management.   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  Discontinued warfarin 1 month ago.  Patient decided just not take it anymore.  Was told that he was out of risk range.        Past Medical History:   Diagnosis Date   ??? Arthritis    ??? BPH (benign prostatic hyperplasia)    ??? Cervical disc disease    ??? 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 ED (erectile dysfunction)    ??? Hypertension    ??? Sleep apnea     CPAP   ??? Thromboembolus [facility])        Past Surgical History:   Procedure Laterality Date   ??? HX BACK SURGERY  03/2016    Lumbar "Drill holes and cleaned it out".   ??? HX CATARACT REMOVAL Bilateral    ??? HX ENDOSCOPY      colonoscopy last was in 2008    ??? 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 Arthritis-osteo Mother    ??? Hypertension Mother    ??? Arthritis-osteo Father    ??? Arthritis-osteo Brother    ??? Cancer Paternal Uncle      unknown   ??? Cancer Paternal Uncle      stomach   ??? Diabetes Paternal Uncle        Social History     Social History   ??? Marital status: MARRIED     Spouse name: N/A   ??? Number of children: N/A   ??? Years of education: N/A     Social History Main Topics   ??? Smoking status: Former Smoker     Packs/day: 0.50     Years: 20.00     Types: Cigarettes     Quit date: 11/30/1984   ??? Smokeless tobacco: Never Used   ??? Alcohol use No   ??? Drug use: No   ??? Sexual activity: Yes     Partners: Female     Other Topics Concern   ??? Blood Transfusions No     Social History Narrative    Medicare wellness 09/23/13        Prior to Admission medications    Medication Sig Start Date  End Date Taking? Authorizing Provider   cpap machine kit    Yes Historical Provider   diclofenac EC (VOLTAREN) 75 mg EC tablet Take 75 mg by mouth two (2) times a day.   Yes Historical Provider   tamsulosin (FLOMAX) 0.4 mg capsule Take 1 Cap by mouth daily. 11/26/15  Yes Brian P Defade, DO   hydrocortisone valerate (WESTCORT) 0.2 % topical cream Apply  to affected area two (2)  times a day. use thin layer   Yes Historical Provider   testosterone 75 mg pllt by Implant route.   Yes Historical Provider   warfarin (COUMADIN) 5 mg tablet TAKE AS DIRECTED 11/26/15  Yes Hermenia Bers, DO   warfarin (COUMADIN) 1 mg tablet As directed 09/09/15  Yes Lauren Dinah Beers, DO   multivitamin (ONE A DAY) tablet Take 0.5 Tabs by mouth two (2) times a day.   Yes Historical Provider   lisinopril (PRINIVIL, ZESTRIL) 10 mg tablet Take 10 mg by mouth daily.    Historical Provider       No Known Allergies    Review of Systems  Comprehensive 10 point review of systems is Negative except as mentioned in HPI.     Immunizations: Immunization history is noncontributory to this encounter.    Physical Exam     Visit Vitals   ??? BP 131/70 (BP 1 Location: Left arm, BP Patient Position: At rest)   ??? Pulse 98   ??? Temp 98.2 ??F (36.8 ??C)   ??? Resp 18   ??? Ht 5' 10"  (1.778 m)   ??? Wt 92.5 kg (204 lb)   ??? SpO2 100%   ??? BMI 29.27 kg/m2      Temp (24hrs), Avg:98.1 ??F (36.7 ??C), Min:97.7 ??F (36.5 ??C), Max:98.4 ??F (36.9 ??C)    Oxygen Therapy  O2 Sat (%): 100 % (04/18/16 1945)  Pulse via Oximetry: 86 beats per minute (04/18/16 1755)  O2 Device: Room air (04/18/16 1945)    Intake/Output Summary (Last 24 hours) at 04/18/16 2049  Last data filed at 04/18/16 1728   Gross per 24 hour   Intake             1000 ml   Output              800 ml   Net              200 ml        General: No acute distress,Alert, Ox3  HEENT:           Atraumatic, PERRLwithout icterus, oral mucosa moist  Lungs:  CTA Bilaterally.   No wheezing, no crackles  CVS:  Regular rate and rhythm,?? No murmur,  rub, or gallop, no carotid bruits.    No edema.  Abdomen: Soft, Non distended, Non tender, Positive bowel sounds  Extremities: No cyanosis, clubbing, pedal pulses present  Neurologic:?? No focal deficits, CN 3-12 intact, no motor/sensory deficits noted in                                  extremities  Skin:               No rashes  Hematologic:  No splenomegaly, excessive bruising or petechiae noted  Lymphatics:    No cervical, axillary or inguinal adenopathy    Musculoskeletal: Neck supple, no joint/muscle tenderness to palpation  Psych:             Appropriate mood and affect      Lab/Data Reviewed:  Recent Results (from the past 24 hour(s))   URINALYSIS W/ RFLX MICROSCOPIC    Collection Time: 04/18/16  6:49 PM   Result Value Ref Range    Color AMBER      Appearance CLOUDY      Specific gravity 1.020 1.002 - 1.030      pH (UA) 7.5 4.5 -  8.0      Protein NEGATIVE  mg/dL    Glucose NEGATIVE  mg/dL    Ketone NEGATIVE  mg/dL    Bilirubin NEGATIVE       Blood NEGATIVE       Urobilinogen 1.0 0 - 1 EU/dL    Nitrites NEGATIVE       Leukocyte Esterase NEGATIVE      URINE MICROSCOPIC    Collection Time: 04/18/16  6:49 PM   Result Value Ref Range    WBC 0-3 0 - 2 /hpf    RBC NONE 0 - 2 /hpf    Epithelial cells 3-5 /lpf    Bacteria 1+ /hpf   PROTHROMBIN TIME + INR    Collection Time: 04/18/16  8:09 PM   Result Value Ref Range    INR 1.0 0.9 - 1.1      Prothrombin time 13.0 11.4 - 15.1 sec           Assessment/Plan     70 y.o. male.  we were consulted for medical management.       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 Pain management per orthopedics  ?? Warfarin 10 mg for 3 days adjust as necessary according to INR.  Bridge with Lovenox.  ?? Follow INR daily  ?? Add senna    Active Problems:    Thromboembolus [facility]):  No acute, history of. Management as mentioned above.      Hypertension (04/18/2016):  No acute issues  Continue lisinopril.    DVT ppx:   Lovenox/warfarin.       We appreciate  this consult.  We will continue to follow along with you.    Signed:  Vickie Epley, MD  04/18/2016   8:54 PM    I personally spent a total of  35 minutes in the care of this patient.

## 2016-04-18 NOTE — Consults (Signed)
H&P      Patient: Julian Bailey               Sex: male             MRN: [id number]      Date of Birth:  1945-12-09      Age:  70 y.o.               No chief complaint on file.      SHPI     Luqman Perrelli Ashraf is a 70 y.o. male. Medical service consult was placed for Medical management.   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  Discontinued warfarin 1 month ago.  Patient decided just not take it anymore.  Was told that he was out of risk range.        Past Medical History:   Diagnosis Date   ??? Arthritis    ??? BPH (benign prostatic hyperplasia)    ??? Cervical disc disease    ??? 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 ED (erectile dysfunction)    ??? Hypertension    ??? Sleep apnea     CPAP   ??? Thromboembolus [facility])        Past Surgical History:   Procedure Laterality Date   ??? HX BACK SURGERY  03/2016    Lumbar "Drill holes and cleaned it out".   ??? HX CATARACT REMOVAL Bilateral    ??? HX ENDOSCOPY      colonoscopy last was in 2008    ??? 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 Arthritis-osteo Mother    ??? Hypertension Mother    ??? Arthritis-osteo Father    ??? Arthritis-osteo Brother    ??? Cancer Paternal Uncle      unknown   ??? Cancer Paternal Uncle      stomach   ??? Diabetes Paternal Uncle        Social History     Social History   ??? Marital status: MARRIED     Spouse name: N/A   ??? Number of children: N/A   ??? Years of education: N/A     Social History Main Topics   ??? Smoking status: Former Smoker     Packs/day: 0.50     Years: 20.00     Types: Cigarettes     Quit date: 11/30/1984   ??? Smokeless tobacco: Never Used   ??? Alcohol use No   ??? Drug use: No   ??? Sexual activity: Yes     Partners: Female     Other Topics Concern   ??? Blood Transfusions No     Social History Narrative    Medicare wellness 09/23/13        Prior to Admission medications     Medication Sig Start Date End Date Taking? Authorizing Provider   cpap machine kit    Yes Historical Provider   diclofenac EC (VOLTAREN) 75 mg EC tablet Take 75 mg by mouth two (2) times a day.   Yes Historical Provider   tamsulosin (FLOMAX) 0.4 mg capsule Take 1 Cap by mouth daily. 11/26/15  Yes Brian P Defade, DO   hydrocortisone valerate (WESTCORT) 0.2 % topical cream Apply  to affected area two (2)  times a day. use thin layer   Yes Historical Provider   testosterone 75 mg pllt by Implant route.   Yes Historical Provider   warfarin (COUMADIN) 5 mg tablet TAKE AS DIRECTED 11/26/15  Yes Hermenia Bers, DO   warfarin (COUMADIN) 1 mg tablet As directed 09/09/15  Yes Lauren Dinah Beers, DO   multivitamin (ONE A DAY) tablet Take 0.5 Tabs by mouth two (2) times a day.   Yes Historical Provider   lisinopril (PRINIVIL, ZESTRIL) 10 mg tablet Take 10 mg by mouth daily.    Historical Provider       No Known Allergies    Review of Systems  Comprehensive 10 point review of systems is Negative except as mentioned in HPI.     Immunizations: Immunization history is noncontributory to this encounter.    Physical Exam     Visit Vitals   ??? BP 131/70 (BP 1 Location: Left arm, BP Patient Position: At rest)   ??? Pulse 98   ??? Temp 98.2 ??F (36.8 ??C)   ??? Resp 18   ??? Ht '5\' 10"'  (1.778 m)   ??? Wt 92.5 kg (204 lb)   ??? SpO2 100%   ??? BMI 29.27 kg/m2      Temp (24hrs), Avg:98.1 ??F (36.7 ??C), Min:97.7 ??F (36.5 ??C), Max:98.4 ??F (36.9 ??C)    Oxygen Therapy  O2 Sat (%): 100 % (04/18/16 1945)  Pulse via Oximetry: 86 beats per minute (04/18/16 1755)  O2 Device: Room air (04/18/16 1945)    Intake/Output Summary (Last 24 hours) at 04/18/16 2049  Last data filed at 04/18/16 1728   Gross per 24 hour   Intake             1000 ml   Output              800 ml   Net              200 ml        General: No acute distress,Alert, Ox3  HEENT:           Atraumatic, PERRLwithout icterus, oral mucosa moist  Lungs:  CTA Bilaterally.   No wheezing, no crackles   CVS:  Regular rate and rhythm,?? No murmur, rub, or gallop, no carotid bruits.    No edema.  Abdomen: Soft, Non distended, Non tender, Positive bowel sounds  Extremities: No cyanosis, clubbing, pedal pulses present  Neurologic:?? No focal deficits, CN 3-12 intact, no motor/sensory deficits noted in                                  extremities  Skin:               No rashes  Hematologic:  No splenomegaly, excessive bruising or petechiae noted  Lymphatics:    No cervical, axillary or inguinal adenopathy    Musculoskeletal: Neck supple, no joint/muscle tenderness to palpation  Psych:             Appropriate mood and affect      Lab/Data Reviewed:  Recent Results (from the past 24 hour(s))   URINALYSIS W/ RFLX MICROSCOPIC    Collection Time: 04/18/16  6:49 PM   Result Value Ref Range    Color AMBER      Appearance CLOUDY      Specific gravity 1.020 1.002 - 1.030      pH (UA) 7.5 4.5 -  8.0      Protein NEGATIVE  mg/dL    Glucose NEGATIVE  mg/dL    Ketone NEGATIVE  mg/dL    Bilirubin NEGATIVE       Blood NEGATIVE       Urobilinogen 1.0 0 - 1 EU/dL    Nitrites NEGATIVE       Leukocyte Esterase NEGATIVE      URINE MICROSCOPIC    Collection Time: 04/18/16  6:49 PM   Result Value Ref Range    WBC 0-3 0 - 2 /hpf    RBC NONE 0 - 2 /hpf    Epithelial cells 3-5 /lpf    Bacteria 1+ /hpf   PROTHROMBIN TIME + INR    Collection Time: 04/18/16  8:09 PM   Result Value Ref Range    INR 1.0 0.9 - 1.1      Prothrombin time 13.0 11.4 - 15.1 sec           Assessment/Plan     70 y.o. male.  we were consulted for medical management.       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 Pain management per orthopedics  ?? Warfarin 10 mg for 3 days adjust as necessary according to INR.  Bridge with Lovenox.  ?? Follow INR daily  ?? Add senna    Active Problems:    Thromboembolus [facility]):  No acute, history of. Management as mentioned above.      Hypertension (04/18/2016):  No acute issues  Continue lisinopril.     DVT ppx:   Lovenox/warfarin.       We appreciate this consult.  We will continue to follow along with you.    Signed:  Vickie Epley, MD  04/18/2016   8:54 PM    I personally spent a total of  35 minutes in the care of this patient.

## 2016-04-18 NOTE — Anesthesia Procedure Notes (Signed)
Peripheral Block    Start time: 04/18/2016 3:38 PM  End time: 04/18/2016 3:41 PM  Performed by: Dyke Weible  Authorized by: Sophiarose Eades       Pre-procedure:   Indications: at surgeon's request and post-op pain management    Preanesthetic Checklist: patient identified, risks and benefits discussed, site marked, timeout performed, anesthesia consent given and patient being monitored      Block Type:   Block Type:  Femoral single shot  Laterality:  Right  Monitoring:  Standard ASA monitoring, frequent vital sign checks, responsive to questions, oxygen, continuous pulse ox and heart rate  Injection Technique:  Single shot  Procedures: nerve stimulator    Patient Position: supine  Prep: betadine and povidone-iodine 7.5% surgical scrub    Location:  Upper thigh  Needle Type:  Stimuplex  Needle Gauge:  22 G  Needle Localization:  Anatomical landmarks and nerve stimulator  Motor Response: minimal motor response >0.4 mA    Medication Injected:  0.5%  ropivacaine  Adds:  Epi 1:200K  Volume (mL):  30    Assessment:  Number of attempts:  1  Injection Assessment:  Incremental injection every 5 mL, no paresthesia, no intravascular symptoms and negative aspiration for blood  Patient tolerance:  Patient tolerated the procedure well with no immediate complications

## 2016-04-18 NOTE — Op Note (Signed)
[facility] [place] [facility]  [facility]    Name:  [name], Aadan E    MR #:   [id number]    Account #:  [id number]    DOB:  14-Jul-1945    Age:  70Y    Location:  402    Date of Admission:  04/18/2016      OPERATIVE REPORT    SURGERY DATE: 04/18/2016    PREOPERATIVE DIAGNOSIS:  End stage arthritis right knee    POSTOPERATIVE DIAGNOSIS:  End stage arthritis right knee    OPERATION:  Right total knee arthroplasty using [employer] metal on plastic  on metal construct with a 70 CR femoral component, 79 fixed I-beam  tibial plate, 34 patella and a 16 mm bearing.     SURGEON:  Heide SparkJoseph Gahel Safley, M.D.  ASSISTANT:  Emeline DarlingKyle Gibson, PA-C  ANESTHESIA:  [facility]   COMPLICATIONS:  None      INDICATIONS FOR PROCEDURE:   Mr. Maurene CapesSouther is a 70 year old male with end stage arthritis of his  right knee.  He has been dealing with this for over four years.  He  has seen Dr. [name] and myself for this pain.  He has pain with  walking, standing, going up and down stairs, bathing and driving.  He has been on ibuprofen 800 mg as well as Diclofenac without  improvement.  He has had intraarticular steroid injection by Dr.  [name] which only lasted a week.  He has been using a brace as  needed.  He has done physical therapy without improvement.  Radiographically he had end stage arthritis with tibial and femoral  sclerosis and osteophyte formation, patellofemoral sclerosis and  osteophyte formation.  He is having a poor quality of life due to  constant right knee pain.  The risks, benefits and alternatives to  right total knee arthroplasty were discussed with the patient and he  wished to proceed.  Of note, he does have a history of DVT and I  advised him that we will defer to the hospitalist for ultimate  manage of this.  I will resume his Coumadin but will most likely use  Lovenox for bridge therapy.  He understood this and wished to  proceed.  We also discussed the risks of postoperative hematoma and   bleeding due to Lovenox and Coumadin.  He felt his knee pain was too  severe not to have the knee arthroplasty performed.      DESCRIPTION OF PROCEDURE:   Following smooth induction of anesthesia, the patient's right lower  extremity was then prepped and draped in the usual sterile fashion.  Tourniquet was applied and insufflated to 350 mmHg.  A midline  incision was utilized.   A medial parapatellar arthrotomy was  performed.  Medial release was performed and fat pad was excised.  I placed collateral ligament retractors.  I made my intramedullary  drill hole and placed my distal femoral cutting guide. I pinned this  in place, performed my bone cuts and removed my extra bone.  Next, I  subluxed the tibia forward releasing the ACL.   I applied my  extramedullary tibial cutting guide and pinned this in place.  I  performed bone cuts and removed excess bone.  Next I drew out  Whiteside's line.  I sized the femur to be a 70 mm.  I made my drill  holes and placed my four-in-one cutting block.  I performed bone  cuts and removed excess bone.  Next, I removed the remainder of the  medial and lateral meniscus.   Next, I subluxed the tibia forward.  I sized this to be a 79.   I used my box osteotome and box punch.  I  placed my trial tibial component.   I flexed the knee and impacted  my femoral component.  I trial and found that a 16 mm bearing  provided excellent stability in varus and valgus testing at 0 and 30  degrees and good A to P stability.  I then made my drill holes for  the lugs.  I then removed all of these components.  I everted the  patella, measured thickness of the patella and performed a free hand  cut of the patella.  I sized it to be a 34.  I made my drill holes.  I placed my trial patella and restored appropriate anatomical  thickness.  I then thoroughly irrigated.  I did inject Toradol,  Epinephrine and Naropin.   I placed cement on the tibia and impacted   my tibial component.  I placed cement on the femur and impacted my  femoral component.   I placed my trial bearing and brought the knee  into full extension.  I everted the patella, placed cement on the  patella, placed my patellar component and clamped this.  I then  applied warm saline and allowed the cement to cure.   Once cured I  trialed once more.  I had excellent stability with varus and valgus  stressing at 0 and 30 degrees and good A to P stability.  The  patella tracked nicely with the "No Thumbs" technique.   I removed  the trial polyethylene, placed my final polyethylene and placed my  locking bar.  I released the tourniquet.  I applied Tranexamic acid.  I closed the capsule with running 0 Quill and interrupted 0 Vicryl.  Subcutaneous tissue was closed with 2-0 Vicryl.  My PA closed the  skin with running 3-0 Monocryl and Dermabond Prineo System.  The  patient tolerated the procedure well.     I did utilize my physician assistant during this case for patient  positioning, prepping and draping, intraoperative retraction and leg  manipulation, wound closure and postoperative dressing application.  This greatly facilitated the procedure, minimizing morbidities for  the patient.         JL:ch DD: 04/18/2016 17:13:23  DT: 04/19/2016 10:40:02 Job ID:  [id number]  CC:

## 2016-04-18 NOTE — Other (Signed)
right femoral nerve block performed by dr Rozell Searingiyer, procedure start at 1538 and end at 1541, tolerated well

## 2016-04-18 NOTE — Other (Signed)
TRANSFER - OUT REPORT:    Verbal report given to RN, Julian Bailey on [name]  being transferred to Ortho (unit) for routine post - op       Report consisted of patient???s Situation, Background, Assessment and   Recommendations(SBAR).     Information from the following report(s) SBAR was reviewed with the receiving nurse.    Opportunity for questions and clarification was provided.      Patient transported with:   Registered Nurse

## 2016-04-18 NOTE — Anesthesia Pre-Procedure Evaluation (Addendum)
Anesthetic History   No history of anesthetic complications            Review of Systems / Medical History  Patient summary reviewed, nursing notes reviewed and pertinent labs reviewed    Pulmonary        Sleep apnea: CPAP           Neuro/Psych   Within defined limits           Cardiovascular    Hypertension: well controlled              Exercise tolerance: >4 METS     GI/Hepatic/Renal  Within defined limits              Endo/Other        Arthritis    Comments: Hx. DVT's; off Coumadin X 1 month Other Findings            Physical Exam    Airway  Mallampati: II  TM Distance: 4 - 6 cm  Neck ROM: normal range of motion   Mouth opening: Normal     Cardiovascular    Rhythm: regular  Rate: normal         Dental  No notable dental hx       Pulmonary  Breath sounds clear to auscultation               Abdominal  GI exam deferred       Other Findings            Anesthetic Plan    ASA: 3  Anesthesia type: spinal      Post-op pain plan if not by surgeon: peripheral nerve block single    Induction: Intravenous  Anesthetic plan and risks discussed with: Patient

## 2016-04-18 NOTE — Anesthesia Post-Procedure Evaluation (Signed)
Post-Anesthesia Evaluation and Assessment    Patient: Julian Bailey MRN: [id number]  SSN: [id number]    Date of Birth: 1945/12/12  Age: 70 y.o.  Sex: male       Cardiovascular Function/Vital Signs  Visit Vitals   ??? BP 113/58   ??? Pulse 86   ??? Temp 36.5 ??C (97.7 ??F)   ??? Resp 22   ??? Ht 5\' 10"  (1.778 m)   ??? Wt 92.5 kg (204 lb)   ??? SpO2 98%   ??? BMI 29.27 kg/m2       Patient is status post spinal anesthesia for Procedure(s):  KNEE ARTHROPLASTY TOTAL RIGHT .    Nausea/Vomiting: None    Postoperative hydration reviewed and adequate.    Pain:  Pain Scale 1: Numeric (0 - 10) (04/18/16 1728)  Pain Intensity 1: 0 (04/18/16 1728)   Managed    Neurological Status: Appropriate for spinal; spinal site is clean and dry; feemoral block placement site is clean, dry, no hematoma  Neuro (WDL): Exceptions to WDL (04/18/16 1702)  Neuro  Neurologic State: Alert (04/18/16 1702)  LLE Motor Response: Pharmacologically paralyzed (04/18/16 1702)  RLE Motor Response: Pharmacologically paralyzed (04/18/16 1702)   At baseline    Mental Status and Level of Consciousness: Arousable    Pulmonary Status:   O2 Device: Room air (04/18/16 1704)   Adequate oxygenation and airway patent    Complications related to anesthesia: None    Post-anesthesia assessment completed. No concerns    Signed By: Dian SituSriram Vaanya Shambaugh, MD     April 18, 2016

## 2016-04-18 NOTE — Anesthesia Procedure Notes (Signed)
Spinal Block    Start time: 04/18/2016 3:53 PM  End time: 04/18/2016 4:04 PM  Performed by: Tambra Muller  Authorized by: IYER, SRIRAM     Pre-procedure:  Indications: primary anesthetic  Preanesthetic Checklist: patient identified, risks and benefits discussed, anesthesia consent, site marked, patient being monitored and timeout performed    Timeout Time: 15:52          Spinal Block:   Patient Position:  Seated  Prep Region:  Lumbar  Prep: Betadine      Location:  L3-4  Technique:  Single shot  Local:  Lidocaine 1%  Local Dose (mL):  1    Needle:   Needle Type:  Pencan  Needle Gauge:  25 G  Attempts:  1      Events: CSF confirmed, no blood with aspiration and no paresthesia        Assessment:  Insertion:  Uncomplicated  Patient tolerance:  Patient tolerated the procedure well with no immediate complications

## 2016-04-18 NOTE — Other (Signed)
Spinal site band- aid clean, dry, intact.

## 2016-04-18 NOTE — Brief Op Note (Signed)
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]    Procedure(s):  KNEE ARTHROPLASTY TOTAL RIGHT   Surgeon(s) and Role:     * Riley Lam, MD - Primary         Assistant Staff:  Physician Assistant: Jonelle Sidle, PA    Surgical Staff:  Circ-1: Maxine Glenn, RN  Physician Assistant: Jonelle Sidle, PA  Scrub Tech-1: Candy L Coomer; Eliseo Squires  Event Time In   Incision Start 1626   Incision Close 1657     Anesthesia: Spinal   Estimated Blood Loss: 20  Specimens:   ID Type Source Tests Collected by Time Destination   1 : Right Knee Bone chips  Fresh Knee   Riley Lam, MD 04/18/2016 1631 Pathology   1 : cath urine  Urine Cath Urine  Riley Lam, MD 04/18/2016 1631 Microbiology      Findings: severe tricompartmental oa   Complications: none  Implants:   Implant Name Type Inv. Item Serial No. Manufacturer Lot No. LRB No. Used Action   CEMENT BNE HV R 40GM -- PALACOS - [id number]  CEMENT BNE HV R 40GM -- PALACOS  [employer] [id number] Right 2 Implanted   PAT SER A STD 34X8.5 3 PEG -- NO WIRE - [id number]  PAT SER A STD 34X8.5 3 PEG -- NO WIRE  [employer] [id number] Right 1 Implanted   TRAY TIB I-BEAM FIX 79MM COCR --  - [id number]  TRAY TIB I-BEAM FIX 79MM COCR --   [employer] [id number] Right 1 Implanted   FEM RET CRUC INTLOK RT 70MM -- VANGUARD - [id number]  FEM RET CRUC INTLOK RT 70MM -- VANGUARD  [employer] [id number] Right 1 Implanted   INSERT TIB BEAR CR 16X79/83MM -- VANGUARD - [id number]   INSERT TIB BEAR CR 16X79/83MM -- [employer] [id number] Right 1 Implanted

## 2016-04-18 NOTE — Progress Notes (Signed)
Patient room rounds completed  Denies needs at this time  No acute distress noted at this time    etusseyRN

## 2016-04-18 NOTE — Progress Notes (Signed)
Rounding on patient for cont. Care. No s/s of distress, denies any needs at this time. Call light within reach.

## 2016-04-18 NOTE — Anesthesia Post-Procedure Evaluation (Deleted)
Post-Anesthesia Evaluation and Assessment    Patient: Julian Bailey MRN: [id number]  SSN: [id number]    Date of Birth: January 10, 1946  Age: 70 y.o.  Sex: male       Cardiovascular Function/Vital Signs  Visit Vitals   ??? BP 113/58   ??? Pulse 86   ??? Temp 36.5 ??C (97.7 ??F)   ??? Resp 22   ??? Ht 5\' 10"  (1.778 m)   ??? Wt 92.5 kg (204 lb)   ??? SpO2 98%   ??? BMI 29.27 kg/m2       Patient is status post spinal anesthesia for Procedure(s):  KNEE ARTHROPLASTY TOTAL RIGHT .    Nausea/Vomiting: None    Postoperative hydration reviewed and adequate.    Pain:  Pain Scale 1: Numeric (0 - 10) (04/18/16 1728)  Pain Intensity 1: 0 (04/18/16 1728)   Managed    Neurological Status:   Neuro (WDL): Exceptions to WDL (04/18/16 1702)  Neuro  Neurologic State: Alert (04/18/16 1702)  LLE Motor Response: Pharmacologically paralyzed (04/18/16 1702)  RLE Motor Response: Pharmacologically paralyzed (04/18/16 1702)   At baseline    Mental Status and Level of Consciousness: Arousable    Pulmonary Status:   O2 Device: Room air (04/18/16 1704)   Adequate oxygenation and airway patent    Complications related to anesthesia: None    Post-anesthesia assessment completed. No concerns    Signed By: Dian SituSriram Destini Cambre, MD     April 18, 2016

## 2016-04-19 LAB — METABOLIC PANEL, BASIC
Anion gap: 7 mmol/L (ref 6–15)
BUN/Creatinine ratio: 17 (ref 7–25)
BUN: 13 MG/DL (ref 7–18)
CO2: 26 mmol/L (ref 21–32)
Calcium: 8.7 MG/DL (ref 8.5–10.1)
Chloride: 109 mmol/L — ABNORMAL HIGH (ref 98–107)
Creatinine: 0.78 MG/DL (ref 0.60–1.30)
GFR est AA: >60 mL/min/{1.73_m2} (ref >60)
GFR est non-AA: >60 mL/min/{1.73_m2} (ref >60)
Glucose: 120 mg/dL — ABNORMAL HIGH (ref 70–110)
Potassium: 3.8 mmol/L (ref 3.5–5.3)
Sodium: 142 mmol/L (ref 136–145)

## 2016-04-19 LAB — CBC WITH AUTOMATED DIFF
ABS. BASOPHILS: 0 10*3/uL (ref 0.0–0.1)
ABS. BASOPHILS: 0 10*3/uL (ref 0.0–0.1)
ABS. EOSINOPHILS: 0 10*3/uL (ref 0.0–0.5)
ABS. EOSINOPHILS: 0 10*3/uL (ref 0.0–0.5)
ABS. IMM. GRANS.: 0 10*3/uL
ABS. LYMPHOCYTES: 0.3 10*3/uL — ABNORMAL LOW (ref 0.8–3.5)
ABS. LYMPHOCYTES: 0.8 10*3/uL (ref 0.8–3.5)
ABS. MONOCYTES: 0.4 10*3/uL — ABNORMAL LOW (ref 0.8–3.5)
ABS. MONOCYTES: 0.9 10*3/uL (ref 0.8–3.5)
ABS. NEUTROPHILS: 10.8 10*3/uL — ABNORMAL HIGH (ref 1.5–8.0)
ABS. NEUTROPHILS: 9.7 10*3/uL — ABNORMAL HIGH (ref 1.5–8.0)
BASOPHILS: 0 % (ref 0–2)
BASOPHILS: 0 % (ref 0–2)
EOSINOPHILS: 0 % (ref 0–5)
EOSINOPHILS: 0 % (ref 0–5)
HCT: 38.8 % — ABNORMAL LOW (ref 41–53)
HCT: 37.7 % — ABNORMAL LOW (ref 41–53)
HGB: 13.9 g/dL (ref 13.5–17.5)
HGB: 13.4 g/dL — ABNORMAL LOW (ref 13.5–17.5)
IMMATURE GRANULOCYTES: 0 % — ABNORMAL LOW (ref 2–10)
LYMPHOCYTES: 2 % — ABNORMAL LOW (ref 19–48)
LYMPHOCYTES: 7 % — ABNORMAL LOW (ref 19–48)
MCH: 32 PG — ABNORMAL HIGH (ref 27–31)
MCH: 33.3 PG — ABNORMAL HIGH (ref 27–31)
MCHC: 35.8 g/dL (ref 31–37)
MCHC: 35.5 g/dL (ref 31–37)
MCV: 89.2 FL (ref 78–98)
MCV: 93.8 FL (ref 80–100)
MONOCYTES: 3 % (ref 3–9)
MONOCYTES: 8 % (ref 3–9)
MPV: 10 FL (ref 5.9–10.3)
MPV: 9.7 FL (ref 5.9–10.3)
NEUTROPHILS: 95 % — ABNORMAL HIGH (ref 40–74)
NEUTROPHILS: 85 % — ABNORMAL HIGH (ref 40–74)
PLATELET: 95 10*3/uL — ABNORMAL LOW (ref 130–400)
PLATELET: 100 10*3/uL — ABNORMAL LOW (ref 130–400)
RBC: 4.35 M/uL — ABNORMAL LOW (ref 4.7–6.1)
RBC: 4.02 M/uL — ABNORMAL LOW (ref 4.7–6.1)
RDW: 13.1 % (ref 11.5–14.5)
RDW: 13.4 % (ref 11.5–14.5)
WBC: 11.5 10*3/uL — ABNORMAL HIGH (ref 4.5–10.8)
WBC: 11.5 10*3/uL — ABNORMAL HIGH (ref 4.5–10.8)

## 2016-04-19 LAB — PROTHROMBIN TIME + INR
INR: 1 (ref 0.9–1.1)
INR: 1.2 — ABNORMAL HIGH (ref 0.9–1.1)
Prothrombin time: 13 s (ref 11.4–15.1)
Prothrombin time: 15.6 s — ABNORMAL HIGH (ref 11.4–15.1)

## 2016-04-19 MED FILL — SENNA 8.6 MG TABLET: 8.6 mg | ORAL | Qty: 1

## 2016-04-19 MED FILL — MULTIVITAMIN TAB: ORAL | Qty: 1

## 2016-04-19 MED FILL — DILAUDID 2 MG TABLET: 2 mg | ORAL | Qty: 1

## 2016-04-19 MED FILL — DICLOFENAC 75 MG TAB, DELAYED RELEASE: 75 mg | ORAL | Qty: 1

## 2016-04-19 MED FILL — CEFAZOLIN 1 GRAM SOLUTION FOR INJECTION: 1 gram | INTRAMUSCULAR | Qty: 1000

## 2016-04-19 MED FILL — TAMSULOSIN SR 0.4 MG 24 HR CAP: 0.4 mg | ORAL | Qty: 1

## 2016-04-19 MED FILL — DEXTROSE 5%-1/2 NORMAL SALINE IV: INTRAVENOUS | Qty: 1000

## 2016-04-19 MED FILL — LISINOPRIL 10 MG TAB: 10 mg | ORAL | Qty: 1

## 2016-04-19 MED FILL — ZIPSOR 25 MG CAPSULE: 25 mg | ORAL | Qty: 1

## 2016-04-19 MED FILL — LOVENOX 100 MG/ML SUBCUTANEOUS SYRINGE: 100 mg/mL | SUBCUTANEOUS | Qty: 1

## 2016-04-19 MED FILL — COUMADIN 5 MG TABLET: 5 mg | ORAL | Qty: 2

## 2016-04-19 MED FILL — OXYCODONE 5 MG TAB: 5 mg | ORAL | Qty: 2

## 2016-04-19 MED FILL — DOCUSATE SODIUM 100 MG CAP: 100 mg | ORAL | Qty: 1

## 2016-04-19 MED FILL — DOK 100 MG CAPSULE: 100 mg | ORAL | Qty: 1

## 2016-04-19 NOTE — Progress Notes (Signed)
Progress Note      Patient: Julian Bailey               Sex: male          MRN: [id number]           Date of Birth:  1945-11-03      Age:  70 y.o.                    Subjective:     Doing well.  Cont to have numbness in rt leg following spinal anesthesia.      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  No other personal or family hx of clots.    Objective:      Visit Vitals   ??? BP 112/61 (BP 1 Location: Right arm, BP Patient Position: At rest)   ??? Pulse 84   ??? Temp 98.5 ??F (36.9 ??C)   ??? Resp 20   ??? Ht 5\' 10"  (1.778 m)   ??? Wt 92.5 kg (204 lb)   ??? SpO2 97%   ??? BMI 29.27 kg/m2         Physical Exam  Gen: aa, nad  Pulm: ctab  Heart: s1s2 rrr no rmg  Abd: s/nt/nd/bs+  Eyes anicteric  Neuro no tremor  Psych no psychomotor agitation  Skin no rash  MS - rt knee bandaged in surgical dressing.        LABS:  Recent Results (from the past 24 hour(s))   URINALYSIS W/ RFLX MICROSCOPIC    Collection Time: 04/18/16  6:49 PM   Result Value Ref Range    Color AMBER      Appearance CLOUDY      Specific gravity 1.020 1.002 - 1.030      pH (UA) 7.5 4.5 - 8.0      Protein NEGATIVE  mg/dL    Glucose NEGATIVE  mg/dL    Ketone NEGATIVE  mg/dL    Bilirubin NEGATIVE       Blood NEGATIVE       Urobilinogen 1.0 0 - 1 EU/dL    Nitrites NEGATIVE       Leukocyte Esterase NEGATIVE      CULTURE, URINE    Collection Time: 04/18/16  6:49 PM   Result Value Ref Range    Special Requests: NO SPECIAL REQUESTS      Culture result: NO GROWTH AFTER 13 HOURS     URINE MICROSCOPIC    Collection Time: 04/18/16  6:49 PM   Result Value Ref Range    WBC 0-3 0 - 2 /hpf    RBC NONE 0 - 2 /hpf    Epithelial cells 3-5 /lpf    Bacteria 1+ /hpf   PROTHROMBIN TIME + INR    Collection Time: 04/18/16  8:09 PM   Result Value Ref Range    INR 1.0 0.9 - 1.1      Prothrombin time 13.0 11.4 - 15.1 sec   METABOLIC PANEL, BASIC    Collection Time: 04/19/16  4:13 AM   Result Value Ref Range    Sodium 142 136 - 145 mmol/L     Potassium 3.8 3.5 - 5.3 mmol/L    Chloride 109 (H) 98 - 107 mmol/L    CO2 26 21 - 32 mmol/L    Anion gap 7 6 - 15 mmol/L    Glucose 120 (H) 70 - 110 mg/dL  BUN 13 7 - 18 MG/DL    Creatinine 8.290.78 5.620.60 - 1.30 MG/DL    BUN/Creatinine ratio 17 7 - 25      GFR est AA >60 >60 ml/min/1.2473m2    GFR est non-AA >60 >60 ml/min/1.1973m2    Calcium 8.7 8.5 - 10.1 MG/DL   CBC WITH AUTOMATED DIFF    Collection Time: 04/19/16  4:13 AM   Result Value Ref Range    WBC 11.5 (H) 4.5 - 10.8 K/uL    RBC 4.35 (L) 4.7 - 6.1 M/uL    HGB 13.9 13.5 - 17.5 g/dL    HCT 13.038.8 (L) 41 - 53 %    MCV 89.2 78 - 98 FL    MCH 32.0 (H) 27 - 31 PG    MCHC 35.8 31 - 37 g/dL    RDW 86.513.1 78.411.5 - 69.614.5 %    PLATELET 95 (L) 130 - 400 K/uL    MPV 10.0 5.9 - 10.3 FL    NEUTROPHILS 95 (H) 40 - 74 %    LYMPHOCYTES 2 (L) 19 - 48 %    MONOCYTES 3 3 - 9 %    EOSINOPHILS 0 0 - 5 %    BASOPHILS 0 0 - 2 %    ABS. NEUTROPHILS 10.8 (H) 1.5 - 8.0 K/UL    ABS. LYMPHOCYTES 0.3 (L) 0.8 - 3.5 K/UL    ABS. MONOCYTES 0.4 (L) 0.8 - 3.5 K/UL    ABS. EOSINOPHILS 0.0 0.0 - 0.5 K/UL    ABS. 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  Prior DVT that was provoked by L TKA in 2009 could probably have had OAC DC'd at 3-6 months.  ACCP guidelines rec ~10 days of OAC.  Pt comfortable w/ self administration of Lovenox.      Leslee HomeMatthew J Cedrik Heindl, DO

## 2016-04-19 NOTE — Progress Notes (Signed)
Ortho Daily Progress Note        POD:  1  S/P:  Right TKA    Afebrile/VSS  Doing well without complaints of nausea  Pain well controlled    Recent Results (from the past 24 hour(s))   URINALYSIS W/ RFLX MICROSCOPIC    Collection Time: 04/18/16  6:49 PM   Result Value Ref Range    Color AMBER      Appearance CLOUDY      Specific gravity 1.020 1.002 - 1.030      pH (UA) 7.5 4.5 - 8.0      Protein NEGATIVE  mg/dL    Glucose NEGATIVE  mg/dL    Ketone NEGATIVE  mg/dL    Bilirubin NEGATIVE       Blood NEGATIVE       Urobilinogen 1.0 0 - 1 EU/dL    Nitrites NEGATIVE       Leukocyte Esterase NEGATIVE      CULTURE, URINE    Collection Time: 04/18/16  6:49 PM   Result Value Ref Range    Special Requests: NO SPECIAL REQUESTS      Culture result: NO GROWTH AFTER 13 HOURS     URINE MICROSCOPIC    Collection Time: 04/18/16  6:49 PM   Result Value Ref Range    WBC 0-3 0 - 2 /hpf    RBC NONE 0 - 2 /hpf    Epithelial cells 3-5 /lpf    Bacteria 1+ /hpf   PROTHROMBIN TIME + INR    Collection Time: 04/18/16  8:09 PM   Result Value Ref Range    INR 1.0 0.9 - 1.1      Prothrombin time 13.0 11.4 - 15.1 sec   METABOLIC PANEL, BASIC    Collection Time: 04/19/16  4:13 AM   Result Value Ref Range    Sodium 142 136 - 145 mmol/L    Potassium 3.8 3.5 - 5.3 mmol/L    Chloride 109 (H) 98 - 107 mmol/L    CO2 26 21 - 32 mmol/L    Anion gap 7 6 - 15 mmol/L    Glucose 120 (H) 70 - 110 mg/dL    BUN 13 7 - 18 MG/DL    Creatinine 1.61 0.96 - 1.30 MG/DL    BUN/Creatinine ratio 17 7 - 25      GFR est AA >60 >60 ml/min/1.33m2    GFR est non-AA >60 >60 ml/min/1.41m2    Calcium 8.7 8.5 - 10.1 MG/DL   CBC WITH AUTOMATED DIFF    Collection Time: 04/19/16  4:13 AM   Result Value Ref Range    WBC 11.5 (H) 4.5 - 10.8 K/uL    RBC 4.35 (L) 4.7 - 6.1 M/uL    HGB 13.9 13.5 - 17.5 g/dL    HCT 04.5 (L) 41 - 53 %    MCV 89.2 78 - 98 FL    MCH 32.0 (H) 27 - 31 PG    MCHC 35.8 31 - 37 g/dL    RDW 40.9 81.1 - 91.4 %    PLATELET 95 (L) 130 - 400 K/uL     MPV 10.0 5.9 - 10.3 FL    NEUTROPHILS 95 (H) 40 - 74 %    LYMPHOCYTES 2 (L) 19 - 48 %    MONOCYTES 3 3 - 9 %    EOSINOPHILS 0 0 - 5 %    BASOPHILS 0 0 - 2 %    ABS. NEUTROPHILS 10.8 (H)  1.5 - 8.0 K/UL    ABS. LYMPHOCYTES 0.3 (L) 0.8 - 3.5 K/UL    ABS. MONOCYTES 0.4 (L) 0.8 - 3.5 K/UL    ABS. EOSINOPHILS 0.0 0.0 - 0.5 K/UL    ABS. BASOPHILS 0.0 0.0 - 0.1 K/UL    DF AUTOMATED            Dressings clean and dry  Moving LE well  Neurocirculatory exam WNL.    PLAN:  Tolerating regular diet  DVT prophylaxis-coumadin/lovenox  WBAT with PT-mobilization  Pain Control  Plan to D/C to Outpatient PT  Hospitalist consult    Vale HavenKyle M Tyreak Reagle, PA  04/19/2016  3:05 PM

## 2016-04-19 NOTE — Progress Notes (Signed)
Pt known to me from Prehab class. Spoke with pt at bedside this am who reports that he plans to go to OP therapy and wife plans to provide transportation.  States has cane but will need walker and chooses [facility] from provider list.   States has been on Coumadin at home and plans to use coumadin upon discharge.     Referral called and faxed to [facility] for walker delivery to room    Resolved from CM point of view. CM to be available to assist should needs arise.     Discharge RN to make sure that pt has RX for OP therapy and call [facility] [phone] if walker has not been delivered to room prior to discharge.  Note to kardex.     Care Management Interventions  PCP Verified by CM: Yes  Mode of Transport at Discharge: Self (family)  Transition of Care Consult (CM Consult): Discharge Planning  Discharge Durable Medical Equipment: No  Physical Therapy Consult: Yes  Occupational Therapy Consult: Yes  Speech Therapy Consult: No  Current Support Network: Lives with Spouse  Confirm Follow Up Transport: Family  Plan discussed with Pt/Family/Caregiver: Yes  Freedom of Choice Offered: Yes  Discharge Location  Discharge Placement: Home with home health

## 2016-04-19 NOTE — Progress Notes (Addendum)
720 Pt is ACO member and will need appointment to follow up with PCP within 7 days of discharge.     Chart screened for discharge planning needs per Case Management protocol. Based on the information currently available in medical record,  care manager has been assigned and will follow for KNEE ARTHROPLASTY TOTAL RIGHT

## 2016-04-19 NOTE — Progress Notes (Signed)
Pt in chair, chair in locked position, pt wife at bedside. Pt denied any need at this time. Hourly rounds completed.

## 2016-04-19 NOTE — Progress Notes (Signed)
Pt resting in chair, no complaints of pain at this time, wheels in locked position. Will continue to monitor.

## 2016-04-19 NOTE — Progress Notes (Signed)
Late entry from 830am   Pt is a participant in the Medicare [employer] ACO program. Spoke with pt re: purpose of [employer] ACO program and explained that it is a program that Medicare assigned him to as an extra resource for services, questions, etc. Also informed pt he would be contacted via telephone by [employer] nurse post DC to offer assistance/provide support. Additionally, discussed with pt the need for PCP follow up within 7 days of hospital DC

## 2016-04-19 NOTE — Progress Notes (Signed)
Patient resting in bed, assessment complete, see flow sheet. No s/s of distress noted. Patient denies needs at this time. Call light in reach.

## 2016-04-19 NOTE — Progress Notes (Signed)
Bedside, Verbal and Written shift change report given to Megan (oncoming nurse) by Alissa (offgoing nurse). Report included the following information Kardex, MAR, Recent Results and Med Rec Status.

## 2016-04-19 NOTE — Progress Notes (Signed)
Occupational Therapy   Hospital Initial Evaluation:    Orders received, chart reviewed, and initial evaluation completed on   NAME: Julian Bailey AGE: 70 y.o.  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 Results of the evaluation consist of:   BUE AROM WFL, BUE MMS 5/5  Alert, oriented x 4  Balance  Sitting: Intact  Standing: With support      Functional Transfers  Sit to Stand: Moderate assistance  Stand to Sit: Moderate assistance  Bed to Chair: Moderate assistance  Toilet Transfer : Moderate assistance      Basic ADL  Feeding: Independent  Oral Facial Hygiene/Grooming: Independent  Bathing: Moderate assistance  Upper Body Dressing: Setup  Lower Body Dressing: Moderate assistance      Coordination  Fine Motor Skills-Upper: Left Intact, Right Intact  Gross Motor Skills-Upper: Left Intact, Right Intact  Pain symptom/location: moderate, right lower extremity    Based on this evaluation, acute care occupational therapy services are not warranted at this time. Patient's spouse will be available to assist prn w/ ADLs upon d/c to home. Physical therapy to see to address ROM, ambulation, and functional transfer needs.    Treatment Plan: Evaluation only    The plan of care has been discussed with the patient.     Thank you for this consult.  Therapist: Delsa GranaAmber D Schweickart, OTR/L, CLT                   Occupational Therapist, Certified Lymphedema Therapist

## 2016-04-19 NOTE — Progress Notes (Signed)
Pt resting in chair, chair locked. Pt denied any need at this time and states his right leg is still numb. Will continue to monitor.

## 2016-04-19 NOTE — Progress Notes (Signed)
Bedside, Verbal and Written shift change report received from Megan  (offgoing nurse). Report included the following information Kardex, MAR, Recent Results and Med Rec Status.

## 2016-04-19 NOTE — Progress Notes (Signed)
Pt seated in chair on entry. Reports he is still "numb". KI applied. Sit>stand CGA. Gait training 1200ft with SW and CGA. Knee buckling under brace, pt using UE's to support weight through walker. Stair training ascending/decending with CGA. Returned to chair. Seated A/AA ROM exercises/stretches for ROM/strength/function. ROM ~5-95 degrees. Pt seated in chair with call button in reach.

## 2016-04-19 NOTE — Anesthesia Post-Procedure Evaluation (Signed)
(  R) Femoral Nerve Single Shot Injection Post-Op Note:    Name:  Julian BottcherGregory Earl Pasqua Age:  70 y.o. Sex: male  DOB: [birth year]  MRN:  [id number]  CSN:  [id number]    The patient is status post Procedure(s):  KNEE ARTHROPLASTY TOTAL RIGHT  on 04/18/2016 by Heide SparkJoseph Leith, MD  The patient had a single shot femoral nerve injection yesterday post-op in the [facility].    POST OP Day # 1 04/19/2016    Visit Vitals   ??? BP 138/86 (BP 1 Location: Right arm, BP Patient Position: At rest)   ??? Pulse 77   ??? Temp 36.8 ??C (98.3 ??F)   ??? Resp 16   ??? Ht 5\' 10"  (1.778 m)   ??? Wt 92.5 kg (204 lb)   ??? SpO2 99%   ??? BMI 29.27 kg/m2       Subjective: Patient rates pain 0 at rest and 0 with movement.  Pain location: None  Ambulation this morning by PT:     Exam:  Removed bandaid, site of femoral nerve injection is normal, without swelling, tenderness, or erythema.  No complications from femoral nerve injection evident.    Plan: Continue current postop pain regimen, per surgeon's orders.    Advance Care Plan was discussed but the patient did not wish or was not able to name a surrogate decision maker or provide an Advance Care Plan     Dian SituSriram Aquil Duhe, MD   04/19/2016  at  8:31 AM

## 2016-04-19 NOTE — Addendum Note (Signed)
Addendum  created 04/19/16 [id number] by Dian SituSriram Keyarah Mcroy, MD    Anesthesia Event edited, Procedure Event Log accessed, Sign clinical note

## 2016-04-19 NOTE — Progress Notes (Signed)
Physical Therapy Initial Evaluation:    Orders reviewed, chart reviewed, and initial evaluation completed on American Electric Powerregory Earl Tews.    Chief complaint: R TKA  Date of onset: 04/18/16  Pre-Morbid functionality: lives at home    Patient presents at an overall level of assistance of moderate assistance with basic functional mobility. Results of the evaluation consist of: Patient supine in bed with active ankle pumps and negative Homan's sign.  Patient advised that leg might not be able to hold patient if patient has had a fem block.  Patient understands.  Patient required mod asst for supine to sit and sit to stand at walker.  Patient ambulated 3 steps to bedside chair with walker and mod asst.  Knee did not buckle, but patient supported body with with arms on walker.Knee immobilizer in place and knee continued to buckle even with KI on.   Patient up in good condition.        Pain Screen Pain Scale 1: Numeric (0 - 10), Pain Intensity 1: 4, Patient Stated Pain Goal: 4       Transfers Sit to Stand: Moderate assistance, Stand to Sit: Moderate assistance, Bed to Chair: Moderate assistance   Bed Mobility Supine to Sit: Moderate assistance, Sit to Supine: Moderate assistance         Based on this evaluation, the patient will benefit from physicial therapy service twice daily for duration of hospital stay toward the achievement of set goals. Patient demonstrates the following rehabilitation potential towards the goals: very likely to progress    Goals: improve functional mobility, improve range of motion, improve strength, increased bed mobility and increased gait. Patient to be independent with supine to sit transfers.  Patient to be independent with sit to stand transfers at a walker.  Patient to ambulate 150 feet with walker & SBA.  Patient to have knee ROM of 0-90 degrees.    Treatment Plan: bed mobility, gait training, range of motion: active/assisted/passive, therapeutic exercise/strengthening and transfer training     The goals and plan of care have been discussed with the patient. The patient agree to work toward stated goal(s). Projected outcome possibility isfunctional recovery.    Thanks for the consult.  Therapist: Heath GoldJulie K Lateka Rady, PT

## 2016-04-20 LAB — CULTURE, URINE
Culture result:: NO GROWTH
Culture: NO GROWTH

## 2016-04-20 LAB — CBC WITH AUTOMATED DIFF
ABS. BASOPHILS: 0 10*3/uL (ref 0.0–0.1)
ABS. EOSINOPHILS: 0.1 10*3/uL (ref 0.0–0.5)
ABS. IMM. GRANS.: 0 10*3/uL
ABS. LYMPHOCYTES: 0.8 10*3/uL (ref 0.8–3.5)
ABS. MONOCYTES: 0.6 10*3/uL — ABNORMAL LOW (ref 0.8–3.5)
ABS. NEUTROPHILS: 5.8 10*3/uL (ref 1.5–8.0)
BASOPHILS: 0 % (ref 0–2)
EOSINOPHILS: 2 % (ref 0–5)
HCT: 37.8 % — ABNORMAL LOW (ref 41–53)
HGB: 12.8 g/dL — ABNORMAL LOW (ref 13.5–17.5)
IMMATURE GRANULOCYTES: 0 % — ABNORMAL LOW (ref 2–10)
LYMPHOCYTES: 11 % — ABNORMAL LOW (ref 19–48)
MCH: 32 PG — ABNORMAL HIGH (ref 27–31)
MCHC: 33.9 g/dL (ref 31–37)
MCV: 94.5 FL (ref 80–100)
MONOCYTES: 8 % (ref 3–9)
MPV: 10 FL (ref 5.9–10.3)
NEUTROPHILS: 79 % — ABNORMAL HIGH (ref 40–74)
PLATELET: 95 10*3/uL — ABNORMAL LOW (ref 130–400)
RBC: 4 M/uL — ABNORMAL LOW (ref 4.7–6.1)
RDW: 13.4 % (ref 11.5–14.5)
WBC: 7.3 10*3/uL (ref 4.5–10.8)

## 2016-04-20 LAB — PROTHROMBIN TIME + INR
INR: 1.6 — ABNORMAL HIGH (ref 0.9–1.1)
Prothrombin time: 18.7 s — ABNORMAL HIGH (ref 11.4–15.1)

## 2016-04-20 MED ORDER — OXYCODONE 10 MG TAB
10 mg | ORAL_TABLET | ORAL | 0 refills | Status: DC | PRN
Start: 2016-04-20 — End: 2016-09-13

## 2016-04-20 MED ORDER — ENOXAPARIN 150 MG/ML SUB-Q SYRINGE
150 mg/mL | INJECTION | Freq: Once | SUBCUTANEOUS | 0 refills | Status: AC
Start: 2016-04-20 — End: 2016-04-20

## 2016-04-20 MED FILL — SENNA 8.6 MG TABLET: 8.6 mg | ORAL | Qty: 1

## 2016-04-20 MED FILL — DOK 100 MG CAPSULE: 100 mg | ORAL | Qty: 1

## 2016-04-20 MED FILL — ZIPSOR 25 MG CAPSULE: 25 mg | ORAL | Qty: 1

## 2016-04-20 MED FILL — OXYCODONE 5 MG TAB: 5 mg | ORAL | Qty: 2

## 2016-04-20 MED FILL — DILAUDID 2 MG TABLET: 2 mg | ORAL | Qty: 1

## 2016-04-20 MED FILL — LOVENOX 100 MG/ML SUBCUTANEOUS SYRINGE: 100 mg/mL | SUBCUTANEOUS | Qty: 1

## 2016-04-20 MED FILL — LISINOPRIL 10 MG TAB: 10 mg | ORAL | Qty: 1

## 2016-04-20 MED FILL — TAMSULOSIN SR 0.4 MG 24 HR CAP: 0.4 mg | ORAL | Qty: 1

## 2016-04-20 MED FILL — DICLOFENAC 75 MG TAB, DELAYED RELEASE: 75 mg | ORAL | Qty: 1

## 2016-04-20 MED FILL — MULTIVITAMIN TAB: ORAL | Qty: 1

## 2016-04-20 NOTE — Discharge Summary (Signed)
Discharge Summary by Jonelle Sidle, PA at 04/20/16 1528                Author: Jonelle Sidle, PA  Service: Physician Assistant  Author Type: Physician Assistant       Filed: 04/20/16 1532  Date of Service: 04/20/16 1528  Status: Signed           Editor: Jonelle Sidle, PA (Physician Assistant)  Cosigner: Riley Lam, MD at 04/20/16 1546                          Discharge Summary      Patient: Julian Bailey               Sex: male          DOA: 04/18/2016            Date of Birth:  [birth year]      Age:  70 y.o.        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]      Discharge Diagnoses:        Problem List as of 04/20/2016   Date Reviewed:  2016/04/22                Codes  Class  Noted - Resolved             * (Principal)Status post total right knee replacement  ICD-10-CM: Z96.651   ICD-9-CM: V43.65    04/18/2016 - Present                       Hypertension  ICD-10-CM: I10   ICD-9-CM: 401.9    04/18/2016 - Present                       Osteoarthritis of right knee  ICD-10-CM: M17.11   ICD-9-CM: 715.96    04/14/2016 - Present                       Thromboembolus ([place])  ICD-10-CM: I74.9   ICD-9-CM: 444.9    Unknown - Present                       Benign non-nodular prostatic hyperplasia with lower urinary tract symptoms  ICD-10-CM: N40.1   ICD-9-CM: 600.91    12/09/2014 - Present                       Erectile dysfunction  ICD-10-CM: N52.9   ICD-9-CM: 607.84    10/30/2013 - Present                       Incomplete bladder emptying  ICD-10-CM: R33.9   ICD-9-CM: 788.21    10/30/2013 - Present                       Anticoagulated on Coumadin  ICD-10-CM: Z51.81, Z79.01   ICD-9-CM: V58.83, V58.61    05/14/2013 - Present                       Dyslipidemia  ICD-10-CM: E78.5  ICD-9-CM: 272.4    05/14/2013 - Present                       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  He has tolerated physical therapy well, his pain is well controlled.  He has been followed by the hospitalist  for medical management.  He will be discharged to home health today on Roxycodone 10 for pain and Lovenox for DVT prophylaxis.      Consults: Hospitalist      Significant Diagnostic Studies: labs:       Discharge Medications:        Current Discharge Medication List              START taking these medications          Details        enoxaparin (LOVENOX) injection  140 mg by SubCUTAneous route once for 1 dose.   Qty: 5 Syringe, Refills:  0               oxyCODONE IR (ROXICODONE) 10 mg tab immediate release tablet  Take 1 Tab by mouth every three (3) hours as needed. Max Daily Amount: 80 mg.   Qty: 60 Tab, Refills:  0                     CONTINUE these medications which have NOT CHANGED          Details        cpap machine kit                 diclofenac EC (VOLTAREN) 75 mg EC tablet  Take 75 mg by mouth two (2) times a day.               tamsulosin (FLOMAX) 0.4 mg capsule  Take 1 Cap by mouth daily.   Qty: 90 Cap, Refills:  3               hydrocortisone valerate (WESTCORT) 0.2 % topical cream  Apply  to affected area two (2) times a day. use thin layer               testosterone 75 mg pllt  by Implant route.               !! warfarin (COUMADIN) 5 mg tablet  TAKE AS DIRECTED   Qty: 90 Tab, Refills:  3               !! warfarin (COUMADIN) 1 mg tablet  As directed   Qty: 180 Tab, Refills:  2          Associated Diagnoses: Anticoagulated on Coumadin               multivitamin (ONE A DAY) tablet  Take 0.5 Tabs by mouth two (2) times a day.  lisinopril (PRINIVIL, ZESTRIL) 10 mg tablet  Take 10 mg by mouth daily.               !! - Potential  duplicate medications found. Please discuss with provider.                  Activity: Activity as tolerated and No driving while on analgesics      Diet: Regular Diet      Wound Care: As directed      Follow-up: 4 weeks      Jonelle Sidle, PA   04/20/2016   3:28 PM

## 2016-04-20 NOTE — Progress Notes (Signed)
Sup to sit to stand I. Gait training with SW 150 feet SBA. Gait steady. No LOB noted. Heel strike noted. Performed standing ex heel lifts, SLR,marching and seated ex knee ext, hip flex, ankle pumps to increase strength and mobility. Call light in reach. Reviewed self assisted ROM ex including heel slides with sheet . ROM 0-95 degrees.

## 2016-04-20 NOTE — Progress Notes (Signed)
[facility] at [facility] notified that pt is bundled payment.

## 2016-04-20 NOTE — Discharge Summary (Signed)
Discharge Summary    Patient: Julian Bailey               Sex: male          DOA: 04/18/2016         Date of Birth:  03-20-1946      Age:  70 y.o.        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]    Discharge Diagnoses:    Problem List as of 04/20/2016  Date Reviewed: 05/09/16          Codes Class Noted - Resolved    * (Principal)Status post total right knee replacement ICD-10-CM: Z96.651  ICD-9-CM: V43.65  04/18/2016 - Present        Hypertension ICD-10-CM: I10  ICD-9-CM: 401.9  04/18/2016 - Present        Osteoarthritis of right knee ICD-10-CM: M17.11  ICD-9-CM: 715.96  04/14/2016 - Present        Thromboembolus ([place]) ICD-10-CM: I74.9  ICD-9-CM: 444.9  Unknown - Present        Benign non-nodular prostatic hyperplasia with lower urinary tract symptoms ICD-10-CM: N40.1  ICD-9-CM: 600.91  12/09/2014 - Present        Erectile dysfunction ICD-10-CM: N52.9  ICD-9-CM: 607.84  10/30/2013 - Present        Incomplete bladder emptying ICD-10-CM: R33.9  ICD-9-CM: 788.21  10/30/2013 - Present        Anticoagulated on Coumadin ICD-10-CM: Z51.81, Z79.01  ICD-9-CM: V58.83, V58.61  05/14/2013 - Present        Dyslipidemia ICD-10-CM: E78.5  ICD-9-CM: 272.4  05/14/2013 - Present        Senile nuclear sclerosis ICD-10-CM: H25.10  ICD-9-CM: 366.16  02/14/2012 - Present        RESOLVED: Nocturia ICD-10-CM: R35.1  ICD-9-CM: 788.43  10/30/2013 - 12/09/2014              Discharge Condition: Good    Hospital Course:  Patient stayed 2 days following a right total knee arthroplasty.  He has tolerated physical therapy well, his pain is well controlled.  He has been followed by the hospitalist for medical management.  He will be discharged to home health today on Roxycodone 10 for pain and Lovenox for DVT prophylaxis.    Consults: Hospitalist    Significant Diagnostic Studies: labs:     Discharge Medications:      Current Discharge Medication List      START taking these medications    Details   enoxaparin (LOVENOX) injection 140 mg by SubCUTAneous route once for 1 dose.  Qty: 5 Syringe, Refills: 0      oxyCODONE IR (ROXICODONE) 10 mg tab immediate release tablet Take 1 Tab by mouth every three (3) hours as needed. Max Daily Amount: 80 mg.  Qty: 60 Tab, Refills: 0         CONTINUE these medications which have NOT CHANGED    Details   cpap machine kit       diclofenac EC (VOLTAREN) 75 mg EC tablet Take 75 mg by mouth two (2) times a day.      tamsulosin (FLOMAX) 0.4 mg capsule Take 1 Cap by mouth daily.  Qty: 90 Cap, Refills: 3      hydrocortisone valerate (WESTCORT) 0.2 % topical cream Apply  to  affected area two (2) times a day. use thin layer      testosterone 75 mg pllt by Implant route.      !! warfarin (COUMADIN) 5 mg tablet TAKE AS DIRECTED  Qty: 90 Tab, Refills: 3      !! warfarin (COUMADIN) 1 mg tablet As directed  Qty: 180 Tab, Refills: 2    Associated Diagnoses: Anticoagulated on Coumadin      multivitamin (ONE A DAY) tablet Take 0.5 Tabs by mouth two (2) times a day.      lisinopril (PRINIVIL, ZESTRIL) 10 mg tablet Take 10 mg by mouth daily.       !! - Potential duplicate medications found. Please discuss with provider.          Activity: Activity as tolerated and No driving while on analgesics    Diet: Regular Diet    Wound Care: As directed    Follow-up: 4 weeks    Jonelle Sidle, PA  04/20/2016  3:28 PM

## 2016-04-20 NOTE — Progress Notes (Signed)
Discharge complete. Patient discharged to home with home health  Discussed discharge instructions including diet, activity, and home medications.  Prescriptions delivered to room through meds to beds.  Spent significant amount of time explaining all new medications and side effects of each, and also purpose of taking and finishing them.   Discussed follow up appointments and reasons to call the doctor or return to the ER.  No questions or concerns.    Patient signed discharge instructions and medicare message.  Patient escorted [facility] staff by wheelchair.   Transportation home per private vehicle.   All of patients belongings left with patient.     Male  visitor at bedside during discharge instructions.

## 2016-04-20 NOTE — Progress Notes (Signed)
Charge nurse rounds completed, no needs or concerns at this time.

## 2016-04-20 NOTE — Progress Notes (Signed)
Reviewed home exercise program with pt to cont working on ROM stretches and exercises. Pt voices understanding.

## 2016-04-20 NOTE — Progress Notes (Signed)
Problem: Falls - Risk of  Goal: *Absence of Falls  Document Schmid Fall Risk and appropriate interventions in the flowsheet.   Outcome: Progressing Towards Goal  Fall Risk Interventions:  Mobility Interventions: Patient to call before getting OOB         Medication Interventions: Bed/chair exit alarm                  Problem: Patient Education: Go to Patient Education Activity  Goal: Patient/Family Education  Outcome: Progressing Towards Goal  Patient will not fall this shift.

## 2016-04-20 NOTE — Progress Notes (Signed)
Notified per Missy, PTA, that walkers delivered today have wheels.   Phone call to Debbie at [facility] to have walker switched out.  Pt notified at bedside and verablizes understanding.   Also reports that his wife had surgery on her hand today and she will not be able to assist him to OP therapy.  [facility] when given list of providers, [facility].    Referral placed into CC and called [facility].  Per [facility] Pharmacist, Lovenox is $51.70 for 5 syringes.  Pt notified at bedside and requests that [facility].    [facility], [facility] and RX will need to be sent there.  Report to Aletha Halimarlena, Charge RN, who will page Dr Noe GensPierzala for RX for meds to beds. Report to Efrain Sellaaniella, Charity fundraiserN.     Discharge RN to call report to [facility] [phone] and make sure that walker has been switched out per [facility] to standard walker. Pt wants Meds to Beds.       Note to Kardex.

## 2016-04-20 NOTE — Progress Notes (Signed)
Ortho Daily Progress Note    04/20/2016  3:26 PM      POD:  2  S/P:  tka    Afebrile/VSS  Doing well without complaints of nausea  Pain well controlled    Recent Results (from the past 24 hour(s))   CBC WITH AUTOMATED DIFF    Collection Time: 04/19/16  4:29 PM   Result Value Ref Range    WBC 11.5 (H) 4.5 - 10.8 K/uL    RBC 4.02 (L) 4.7 - 6.1 M/uL    HGB 13.4 (L) 13.5 - 17.5 g/dL    HCT 54.0 (L) 41 - 53 %    MCV 93.8 80 - 100 FL    MCH 33.3 (H) 27 - 31 PG    MCHC 35.5 31 - 37 g/dL    RDW 98.1 19.1 - 47.8 %    PLATELET 100 (L) 130 - 400 K/uL    MPV 9.7 5.9 - 10.3 FL    NEUTROPHILS 85 (H) 40 - 74 %    LYMPHOCYTES 7 (L) 19 - 48 %    MONOCYTES 8 3 - 9 %    EOSINOPHILS 0 0 - 5 %    BASOPHILS 0 0 - 2 %    ABS. NEUTROPHILS 9.7 (H) 1.5 - 8.0 K/UL    ABS. LYMPHOCYTES 0.8 0.8 - 3.5 K/UL    ABS. MONOCYTES 0.9 0.8 - 3.5 K/UL    ABS. EOSINOPHILS 0.0 0.0 - 0.5 K/UL    ABS. BASOPHILS 0.0 0.0 - 0.1 K/UL    DF AUTOMATED      IMMATURE GRANULOCYTES 0 (L) 2 - 10 %    ABS. IMM. GRANS. 0.0 K/UL   PROTHROMBIN TIME + INR    Collection Time: 04/19/16  4:29 PM   Result Value Ref Range    INR 1.2 (H) 0.9 - 1.1      Prothrombin time 15.6 (H) 11.4 - 15.1 sec   CBC WITH AUTOMATED DIFF    Collection Time: 04/20/16  5:41 AM   Result Value Ref Range    WBC 7.3 4.5 - 10.8 K/uL    RBC 4.00 (L) 4.7 - 6.1 M/uL    HGB 12.8 (L) 13.5 - 17.5 g/dL    HCT 29.5 (L) 41 - 53 %    MCV 94.5 80 - 100 FL    MCH 32.0 (H) 27 - 31 PG    MCHC 33.9 31 - 37 g/dL    RDW 62.1 30.8 - 65.7 %    PLATELET 95 (L) 130 - 400 K/uL    MPV 10.0 5.9 - 10.3 FL    NEUTROPHILS 79 (H) 40 - 74 %    LYMPHOCYTES 11 (L) 19 - 48 %    MONOCYTES 8 3 - 9 %    EOSINOPHILS 2 0 - 5 %    BASOPHILS 0 0 - 2 %    ABS. NEUTROPHILS 5.8 1.5 - 8.0 K/UL    ABS. LYMPHOCYTES 0.8 0.8 - 3.5 K/UL    ABS. MONOCYTES 0.6 (L) 0.8 - 3.5 K/UL    ABS. EOSINOPHILS 0.1 0.0 - 0.5 K/UL    ABS. BASOPHILS 0.0 0.0 - 0.1 K/UL    DF AUTOMATED      IMMATURE GRANULOCYTES 0 (L) 2 - 10 %    ABS. IMM. GRANS. 0.0 K/UL    PROTHROMBIN TIME + INR    Collection Time: 04/20/16  5:41 AM   Result Value Ref Range    INR 1.6 (H)  0.9 - 1.1      Prothrombin time 18.7 (H) 11.4 - 15.1 sec          Dressings clean and dry  Moving LE well  Neurocirculatory exam WNL.    PLAN:  Tolerating regular diet  DVT prophylaxis-lovenox/coumadin  WBAT with PT-mobilization  Pain Control  Plan to D/C home  Hospitalist consult    Heide SparkJoseph Sharay Bellissimo, MD

## 2016-04-20 NOTE — Progress Notes (Signed)
Progress Note      Patient: Julian Bailey               Sex: male          MRN: [id number]           Date of Birth:  1946/06/03      Age:  70 y.o.                    Subjective:   Doing well.  Anticipates DC today.    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  No other personal or family hx of clots.    Objective:      Visit Vitals   ??? BP 136/87 (BP 1 Location: Right arm, BP Patient Position: Sitting)   ??? Pulse 80   ??? Temp 98.3 ??F (36.8 ??C)   ??? Resp 18   ??? Ht 5\' 10"  (1.778 m)   ??? Wt 92.5 kg (204 lb)   ??? SpO2 99%   ??? BMI 29.27 kg/m2         Physical Exam  Gen: aa, nad  Pulm: ctab  Heart: s1s2 rrr no rmg  Abd: s/nt/nd/bs+  Eyes anicteric  Neuro no tremor  Psych no psychomotor agitation  Skin no rash  MS - rt knee bandaged in surgical dressing.        LABS:  Recent Results (from the past 24 hour(s))   CBC WITH AUTOMATED DIFF    Collection Time: 04/19/16  4:29 PM   Result Value Ref Range    WBC 11.5 (H) 4.5 - 10.8 K/uL    RBC 4.02 (L) 4.7 - 6.1 M/uL    HGB 13.4 (L) 13.5 - 17.5 g/dL    HCT 40.937.7 (L) 41 - 53 %    MCV 93.8 80 - 100 FL    MCH 33.3 (H) 27 - 31 PG    MCHC 35.5 31 - 37 g/dL    RDW 81.113.4 91.411.5 - 78.214.5 %    PLATELET 100 (L) 130 - 400 K/uL    MPV 9.7 5.9 - 10.3 FL    NEUTROPHILS 85 (H) 40 - 74 %    LYMPHOCYTES 7 (L) 19 - 48 %    MONOCYTES 8 3 - 9 %    EOSINOPHILS 0 0 - 5 %    BASOPHILS 0 0 - 2 %    ABS. NEUTROPHILS 9.7 (H) 1.5 - 8.0 K/UL    ABS. LYMPHOCYTES 0.8 0.8 - 3.5 K/UL    ABS. MONOCYTES 0.9 0.8 - 3.5 K/UL    ABS. EOSINOPHILS 0.0 0.0 - 0.5 K/UL    ABS. BASOPHILS 0.0 0.0 - 0.1 K/UL    DF AUTOMATED      IMMATURE GRANULOCYTES 0 (L) 2 - 10 %    ABS. IMM. GRANS. 0.0 K/UL   PROTHROMBIN TIME + INR    Collection Time: 04/19/16  4:29 PM   Result Value Ref Range    INR 1.2 (H) 0.9 - 1.1      Prothrombin time 15.6 (H) 11.4 - 15.1 sec   CBC WITH AUTOMATED DIFF    Collection Time: 04/20/16  5:41 AM   Result Value Ref Range    WBC 7.3 4.5 - 10.8 K/uL    RBC 4.00 (L) 4.7 - 6.1 M/uL     HGB 12.8 (L) 13.5 - 17.5 g/dL    HCT 95.637.8 (L) 41 - 53 %  MCV 94.5 80 - 100 FL    MCH 32.0 (H) 27 - 31 PG    MCHC 33.9 31 - 37 g/dL    RDW 16.1 09.6 - 04.5 %    PLATELET 95 (L) 130 - 400 K/uL    MPV 10.0 5.9 - 10.3 FL    NEUTROPHILS 79 (H) 40 - 74 %    LYMPHOCYTES 11 (L) 19 - 48 %    MONOCYTES 8 3 - 9 %    EOSINOPHILS 2 0 - 5 %    BASOPHILS 0 0 - 2 %    ABS. NEUTROPHILS 5.8 1.5 - 8.0 K/UL    ABS. LYMPHOCYTES 0.8 0.8 - 3.5 K/UL    ABS. MONOCYTES 0.6 (L) 0.8 - 3.5 K/UL    ABS. EOSINOPHILS 0.1 0.0 - 0.5 K/UL    ABS. BASOPHILS 0.0 0.0 - 0.1 K/UL    DF AUTOMATED      IMMATURE GRANULOCYTES 0 (L) 2 - 10 %    ABS. IMM. GRANS. 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  Prior DVT that was provoked by L TKA in 2009 could probably have had OAC DC'd at 3-6 months.  ACCP guidelines rec ~10 days of OAC.  Pt comfortable w/ self administration of Lovenox as bridging therapy to longer term Coumadin.  Rx for Lovenox 1.5mg / kg SQ daily written and left in DC navigator.  Duration of ongoing Coumadin to be discussed w/ PCP who may have knowledge of clotting risks that I'm not aware of.      Leslee Home, DO

## 2016-04-20 NOTE — Progress Notes (Addendum)
0600 [facility]. Patient resting in bed with no apparent signs of distress noted. White board updated. Call light within reach.     [id number] Shift assessment done at this time and documented in flow sheet.     0800 Patient sitting up in chair with no needs voiced at this time. Call light within reach.     1000 Patient sitting up in the chair with no needs voiced at this time. Morning meds and pain medication given per request. Call light within reach.     1200 Patient sitting up in the chair with no needs voiced at this time. Call light within reach.     1400 Patient sitting up in chair with no needs voiced at this time. Wants to go home. Dressed and ready to go. Call light within reach.

## 2016-04-21 ENCOUNTER — Encounter: Admit: 2016-04-21 | Discharge: 2016-04-21 | Payer: MEDICARE | Primary: Family Medicine

## 2016-04-21 ENCOUNTER — Encounter: Primary: Family Medicine

## 2016-04-21 NOTE — Progress Notes (Signed)
Julian Bailey has been contacted regarding recent Inpatient/Surgical procedure.  Verified PCP.  Discharge medications were reviewed with the patient/caregiver by telephone.      Discharge Date 04-20-16   Touch Point week 1   [facility]   Surgical site and appearance/ s/s of infection Right knee, pt stated it is still bandaged   Level of pain 1-10/ Type of pain/ location  4, pt stated it is tender right knee   Has your pain been controlled, taking pain medications and bowel issues Yes/ Oxycodone IR/ yes but is taking a stool softner   Patient???s mood Very good   Physical Therapy/ Provider/ How Often Yes/[facility]/ not sure HH is coming today   How have you been progressing with PT and have you been able to safely progress to a cane or walker   Pt stated he is using a walker   Follow up with surgeon/ date Julian Bailey 05-16-16   Follow up with PCP / date Julian Bailey 04-27-16   Transportation to appointments No issues   Medical Equipment/ cane walker or other DME / Provider walker    Questions or concerns at this time  Not at this time   Have you had any changes or concerns with your health since returning home  no   Have you been to the ED or Hospital  no         I advised the patient to bring his medications in the original bottles to office visits. For any concerns with surgery contact your surgeon.  For all other health concerns please contact your PCP.  Provided patient with my name and contact information and instructed patient if he  has any question to call.  No further needs at this time.    This note will not be viewable in MyChart.

## 2016-04-22 NOTE — Telephone Encounter (Signed)
Julian StainsGregory Earl Bailey has been contacted regarding recent hospital admission.  Current medications were reviewed with the patient/caregiver by telephone.    Date of Admission:  04/18/16     Date of Discharge: 04/20/16     [facility]     Reason for Admission: Right total knee arthroplasty   Any medication changes? Yes   Roxicodone 10 mg Q 3 hours PRN     How is the patient feeling? States he is feeling okay     Does the patient have any questions or problems? Not at this time.   Follow-up Appointment date: 04/27/16 @ 1:40 pm       I advised the patient to bring his medications in the original bottles and to contact office, or go to either urgent care or emergency care if symptoms return before scheduled appointment.    Laverne Hursey L Tyrena Gohr, LPN [id number]/27/2017 0:631:42 PM

## 2016-04-22 NOTE — Telephone Encounter (Signed)
TOC nurse phone call completed with patient.

## 2016-04-23 ENCOUNTER — Encounter: Primary: Family Medicine

## 2016-04-25 ENCOUNTER — Encounter: Admit: 2016-04-25 | Discharge: 2016-04-25 | Payer: MEDICARE | Primary: Family Medicine

## 2016-04-25 NOTE — Progress Notes (Signed)
Julian Bailey has been contacted regarding recent Inpatient/Surgical procedure.  Verified PCP.  Discharge medications were reviewed with the patient/caregiver by telephone.  ??  ??  Discharge Date 04-20-16   Touch Point week Week 2 call 1   [facility]   Surgical site and appearance/ s/s of infection Right knee, pt stated it looks good   Level of pain 1-10/ Type of pain/ location  4, pt stated it is tender right knee   Has your pain been controlled, taking pain medications and bowel issues Yes/ Oxycodone IR/ yes but is taking a stool softner   Patient???s mood Very good   Physical Therapy/ Provider/ How Often Yes/[facility]/ 3 times a week   How have you been progressing with PT and have you been able to safely progress to a cane or walker   Using a walker and cane   Follow up with surgeon/ date [name] 05-16-16   Follow up with PCP / date [name] 04-27-16   Transportation to appointments No issues   Medical Equipment/ cane walker or other DME / Provider walker    Questions or concerns at this time  Not at this time   Have you had any changes or concerns with your health since returning home  no   Have you been to the ED or Hospital  no   ??  ??  ??  I advised the patient to bring his medications in the original bottles to office visits. For any concerns with surgery contact your surgeon.  For all other health concerns please contact your PCP.  Provided patient with my name and contact information and instructed patient if he  has any question to call.  No further needs at this time.  ??      This note will not be viewable in MyChart.

## 2016-04-27 ENCOUNTER — Ambulatory Visit: Admit: 2016-04-27 | Discharge: 2016-04-27 | Payer: MEDICARE | Attending: Family Medicine | Primary: Family Medicine

## 2016-04-27 ENCOUNTER — Inpatient Hospital Stay: Admit: 2016-04-27 | Payer: MEDICARE | Primary: Family Medicine

## 2016-04-27 ENCOUNTER — Encounter: Primary: Family Medicine

## 2016-04-27 ENCOUNTER — Encounter: Admit: 2016-04-27 | Discharge: 2016-04-27 | Payer: MEDICARE | Primary: Family Medicine

## 2016-04-27 DIAGNOSIS — Z86718 Personal history of other venous thrombosis and embolism: Secondary | ICD-10-CM

## 2016-04-27 DIAGNOSIS — M1711 Unilateral primary osteoarthritis, right knee: Secondary | ICD-10-CM

## 2016-04-27 NOTE — Patient Instructions (Signed)
[facility]  Office working hours are Monday - Friday 8:00AM-5:00PM.  Office phone number is [phone] and fax is [phone]  For non-emergent medical care and clinical advice during office hours:   1. Call office or   2.  Send message or request using MyChart  For non-emergent medical care and clinical advice after office hours:  1.  Send message or request using MyChart   2.  Call [phone] and leave a message with our answering service or    Emergency care can be obtained at the [facility] ER, Urgent Care or by calling 911.    Patient Satisfaction Survey  As a valued patient, you will be receiving a survey from [employer].  We encourage you to share your thoughts and opinions about the care you received today. Thank you for choosing [facility]           BRING ALL MEDICATION BOTTLES TO EVERY APPOINTMENT    Medication Refills -   * Please do not let your medication run out before calling for a refill, the best practice would be to call when you have 2 weeks of medication left.  * Please allow 48 hours for medications to be called into your pharmacy when you request a medication refill      Have MyChart? Use it to request medication refills, it's quick and easy! If you have questions about MyChart please feel free to ask our staff!
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
## 2016-04-28 ENCOUNTER — Encounter: Admit: 2016-04-28 | Discharge: 2016-04-28 | Payer: MEDICARE | Primary: Family Medicine

## 2016-04-28 ENCOUNTER — Encounter: Primary: Family Medicine

## 2016-04-28 LAB — PROTHROMBIN TIME + INR
INR: 2.3 — ABNORMAL HIGH (ref 0.9–1.1)
Prothrombin time: 25.5 s — ABNORMAL HIGH (ref 11.4–15.1)

## 2016-04-29 ENCOUNTER — Encounter: Admit: 2016-04-29 | Discharge: 2016-04-29 | Payer: MEDICARE | Primary: Family Medicine

## 2016-04-29 NOTE — Progress Notes (Signed)
Ortho post-op call made.  Left msg to return call.

## 2016-04-29 NOTE — Progress Notes (Signed)
This note will not be viewable in MyChart.      Julian StainsGregory Earl Bailey??has been contacted regarding recent Inpatient/Surgical procedure. ??Verified PCP. ??Discharge medications were reviewed with the patient/caregiver by telephone.  ????  ????  Discharge Date 04-20-16   [place] week Week 2 call 2   [facility]   Surgical site and appearance/ s/s of infection Right knee, pt stated it looks good, it is dried up   Level of pain 1-10/ Type of pain/ location  Pt stated it is mostly muscle cramps   Has your pain been controlled, taking pain medications and bowel issues Pt stated he is taking them once in a while   Patient???s mood Very good   Physical Therapy/ Provider/ How Often Yes/[facility]/ 3 times a week   How have you been progressing with PT and have you been able to safely progress to a cane or walker ?? Pt stated he is using a cane for balance   Follow up with surgeon/ date [name] 05-16-16   Follow up with PCP / date [name] 09-13-16   Transportation to appointments No issues   Medical Equipment/ cane walker or other DME??/ Provider walker   ??Questions or concerns at this time  Not at this time   Have you had any changes or concerns with your health since returning home  no   Have you been to the ED or Hospital  no   ????  ????  ????  I advised the patient to bring his??medications in the original bottles to office visits. For any concerns with surgery contact your surgeon. ??For all other health concerns please contact your PCP. ??Provided patient with my name and contact information and instructed patient if he????has any question to call. ??No further needs at this time.  ????  ??

## 2016-04-29 NOTE — Progress Notes (Signed)
Patient notified, voiced understanding.

## 2016-04-29 NOTE — Progress Notes (Signed)
Tell pt know

## 2016-05-02 ENCOUNTER — Encounter: Admit: 2016-05-02 | Discharge: 2016-05-02 | Payer: MEDICARE | Primary: Family Medicine

## 2016-05-03 ENCOUNTER — Encounter: Attending: Urology | Primary: Family Medicine

## 2016-05-04 ENCOUNTER — Encounter: Primary: Family Medicine

## 2016-05-05 NOTE — Progress Notes (Signed)
Attempted to contact patient for ortho bundle week 3 follow up.  No answer and no voice mail.      This note will not be viewable in MyChart.

## 2016-05-05 NOTE — Progress Notes (Signed)
Attempted to contact patient for Ortho bundle week 3 follow up.  No answer and no voice mail.      This note will not be viewable in MyChart.

## 2016-05-06 ENCOUNTER — Encounter: Primary: Family Medicine

## 2016-05-11 NOTE — Progress Notes (Signed)
This note will not be viewable in MyChart.      Julian StainsGregory Earl Schwarz??has been contacted regarding recent Inpatient/Surgical procedure. ??Verified PCP. ??Discharge medications were reviewed with the patient/caregiver by telephone.  ????  ????  Discharge Date 04-20-16   [place] week Week 4   [facility]   Surgical site and appearance/ s/s of infection Right knee, pt stated it looks good   Level of pain 1-10/ Type of pain/ location  Pt stated ot bad   Has your pain been controlled, taking pain medications and bowel issues Pt stated he is taking 1/2 a pill   Patient???s mood Very good   Physical Therapy/ Provider/ How Often Pt state he is going to [place]   How have you been progressing with PT and have you been able to safely progress to a cane or walker ?? Pt stated he is using a cane for balance   Follow up with surgeon/ date [name] 05-16-16   Follow up with PCP / date [name] 09-13-16   Transportation to appointments No issues   Medical Equipment/ cane walker or other DME??/ Provider none   ??Questions or concerns at this time  Not at this time   Have you had any changes or concerns with your health since returning home  no   Have you been to the ED or Hospital  no   ????  ????  ????  I advised the patient to bring his??medications in the original bottles to office visits. For any concerns with surgery contact your surgeon. ??For all other health concerns please contact your PCP. ??Provided patient with my name and contact information and instructed patient if he????has any question to call. ??No further needs at this time.  ????  ????

## 2016-05-12 ENCOUNTER — Encounter: Primary: Family Medicine

## 2016-05-12 NOTE — Progress Notes (Signed)
Ortho post-op call made-2nd attempt.  Left msg to return call.

## 2016-05-18 NOTE — Progress Notes (Signed)
Attempted to contact patient for ortho bundle week 5 follow up.  No answer and no voice mail.    This note will not be viewable in MyChart.

## 2016-05-18 NOTE — Progress Notes (Signed)
Attempted to contact patient for a Ortho bundle week 5 follow up.  Left HIPAA compliant voicemail requesting patient to call back at their convenience.       This note will not be viewable in MyChart.

## 2016-05-19 ENCOUNTER — Encounter: Primary: Family Medicine

## 2016-05-26 ENCOUNTER — Encounter: Primary: Family Medicine

## 2016-05-26 NOTE — Progress Notes (Signed)
This note will not be viewable in MyChart.      Julian StainsGregory Earl Bailey??has been contacted regarding recent Inpatient/Surgical procedure. ??Verified PCP. ??Discharge medications were reviewed with the patient/caregiver by telephone.  ????  ????  Discharge Date 04-20-16   [place] week 6   [facility]   Surgical site and appearance/ s/s of infection Right knee, pt stated it looks good starting to flatten out and peel   Level of pain 1-10/ Type of pain/ location  Pt states about a 3, sore to touch and sharp in the joint   Has your pain been controlled, taking pain medications and bowel issues Pt states he takes very little during th eday and takes it at night   Patient???s mood Very good   Physical Therapy/ Provider/ How Often Pt state he is going to [place], pt stated 3 times a week   How have you been progressing with PT and have you been able to safely progress to a cane or walker ?? Pt stated he is using a cane for balance   Follow up with surgeon/ date Julian RoundLeith doesn't remember when   Follow up with PCP / date Julian Bailey 09-13-16   Transportation to appointments No issues   Medical Equipment/ cane walker or other DME??/ Provider None   ??Questions or concerns at this time  Not at this time   Have you had any changes or concerns with your health since returning home  no   Have you been to the ED or Hospital  no   ????  ????  ????  I advised the patient to bring his??medications in the original bottles to office visits. For any concerns with surgery contact your surgeon. ??For all other health concerns please contact your PCP. ??Provided patient with my name and contact information and instructed patient if he????has any question to call. ??No further needs at this time.  ????  ????

## 2016-05-26 NOTE — Progress Notes (Signed)
Attempted to contact patient for Ortho bundle week 6 follow up.  No answer and no voice mail.    This note will not be viewable in MyChart.

## 2016-06-02 NOTE — Progress Notes (Signed)
This note will not be viewable in MyChart.    Attempted to contact patient for Ortho bundle week 7 follow up.  No answer and no voice mail.

## 2016-06-02 NOTE — Progress Notes (Signed)
This note will not be viewable in MyChart.      Attempted to contact patient for Ortho bundle week7 follow up.  No answer and no voice mail.

## 2016-06-09 NOTE — Progress Notes (Signed)
Attempted to contact patient for ortho bundle week 8 follow up.  No answer and no voice mail.      This note will not be viewable in MyChart.

## 2016-06-09 NOTE — Progress Notes (Signed)
Attempted to contact patient for ortho bundle week 8 follow up.  No answer and no voice mail.    This note will not be viewable in MyChart.

## 2016-06-13 ENCOUNTER — Ambulatory Visit: Admit: 2016-06-13 | Discharge: 2016-06-13 | Payer: MEDICARE | Attending: Urology | Primary: Family Medicine

## 2016-06-13 DIAGNOSIS — N401 Enlarged prostate with lower urinary tract symptoms: Secondary | ICD-10-CM

## 2016-06-13 LAB — AMB POC URINALYSIS DIP STICK AUTO W/O MICRO
Bilirubin (UA POC): NEGATIVE
Blood (UA POC): NEGATIVE
Glucose (UA POC): NEGATIVE
Ketones (UA POC): NEGATIVE
Leukocyte esterase (UA POC): NEGATIVE
Nitrites (UA POC): NEGATIVE
Protein (UA POC): NEGATIVE
Specific gravity (UA POC): 1.015 (ref 1.001–1.035)
pH (UA POC): 7 (ref 4.6–8.0)

## 2016-06-13 MED ORDER — FINASTERIDE 5 MG TAB
5 mg | ORAL_TABLET | Freq: Every day | ORAL | 3 refills | Status: DC
Start: 2016-06-13 — End: 2017-08-07

## 2016-06-13 NOTE — Patient Instructions (Addendum)
[facility]     Office working hours are 8:00am-4:30pm    Office phone number is [phone] and the fax line is [phone]  For non-emergent medical care and clinical advice during office hours  1. Call the office or  2. Send a message to office staff via mychart  Emergency care can be obtained at the [facility] ER, Urgent Care or 911.    Patient Satisfaction Survey   We appreciate you sharing your e-mail address with us. Please watch for our patient  satisfaction survey which you will receive by e-mail. We strive to provide you with the   best care possible. We respect all comments and will take them into consideration to improve our service.  Thank you for your participation.    Finasteride (By mouth)   Finasteride (fin-AS-ter-ide)  Treats benign prostatic hyperplasia (enlarged prostate). Also treats hair loss in men.   Brand Name(s): Propecia, Proscar   There may be other brand names for this medicine.  When This Medicine Should Not Be Used:   You should not use this medicine if you have had an allergic reaction to finasteride. Women and children should not use this medicine.  How to Use This Medicine:   Tablet  ?? Your doctor will tell you how much medicine to use. Do not use more than directed.  ?? You may take this medicine with or without food.  ?? Take this medicine at the same time each day.  ?? For hair loss, you may need to take this medicine for 3 months or longer before you see an effect.  ?? For an enlarged prostate, you may need to take this medicine for up to 6 months to see the full effect.  ?? Read and follow the patient instructions that come with this medicine. Talk to your doctor or pharmacist if you have any questions.  If a dose is missed:   ?? If you miss a dose or forget to take your medicine, skip the missed dose and take your next regular dose. Do not use extra medicine to make up for a missed dose.  How to Store and Dispose of This Medicine:    ?? Store the medicine in a closed container at room temperature, away from heat, moisture, and direct light.  ?? Ask your pharmacist, doctor, or health caregiver about the best way to dispose of any outdated medicine or medicine no longer needed.  ?? Keep all medicine out of the reach of children. Never share your medicine with anyone.  Drugs and Foods to Avoid:      Ask your doctor or pharmacist before using any other medicine, including over-the-counter medicines, vitamins, and herbal products.      Warnings While Using This Medicine:   ?? Women who are pregnant or may become pregnant should avoid handling or touching this medicine. This medicine can get into the body through the skin and may harm an unborn male baby. If any of this medicine gets on the skin of a woman, wash the area immediately with soap and water.  ?? Make sure your doctor knows if you have liver disease or severe problems urinating.  ?? This medicine will not cure an enlarged prostate problem or male pattern baldness. Once you stop using this medicine, the prostate will begin to grow again within a few months and any new hair will be lost within 12 months.  ?? This medicine may cause changes to the breast tissue. Tell your doctor if   you have any lumps, pain, tenderness, or an enlargement of the breasts while using this medicine.  ?? This medicine will not prevent prostate cancer but may increase your risk of developing high-grade prostate cancer. Tell your doctor if you have concerns about this risk.  ?? This medicine may affect the results of the prostate specific antigen (PSA) test, which may be used to detect prostate cancer. Make sure you tell all of your doctors that you are using this medicine.  ?? This medicine may cause a decrease in the amount of semen you ejaculate during sex. This will not affect your sperm count or your ability to have children.  ?? Your doctor will check your progress and the effects of this medicine at  regular visits. Keep all appointments. Blood tests may be needed to check for unwanted effects.  Possible Side Effects While Using This Medicine:   Call your doctor right away if you notice any of these side effects:  ?? Allergic reaction: Itching or hives, swelling in your face or hands, swelling or tingling in your mouth or throat, chest tightness, trouble breathing  ?? Breast swelling, pain, or tenderness.  ?? Lightheadedness, dizziness, or fainting.  ?? Lumps in the breast or under the arm.  ?? Pain in the testicles.  ?? Swelling in your hands, ankles, or feet.  If you notice these less serious side effects, talk with your doctor:   ?? Decrease in the amount of semen.  ?? Loss of interest in sex or the ability to have sex (impotence).  ?? Skin rash.  ?? Sleepiness or unusual drowsiness.  ?? Stuffy or runny nose.  ?? Trouble having or keeping an erection.  ?? Weakness.  If you notice other side effects that you think are caused by this medicine, tell your doctor.   Call your doctor for medical advice about side effects. You may report side effects to FDA at 1-800-FDA-1088  ?? 2017 [facility] Information is for End User's use only and may not be sold, redistributed or otherwise used for commercial purposes.  The above information is an educational aid only. It is not intended as medical advice for individual conditions or treatments. Talk to your doctor, nurse or pharmacist before following any medical regimen to see if it is safe and effective for you.       Benign Prostatic Hyperplasia: Care Instructions  Your Care Instructions    Benign prostatic hyperplasia, or BPH, is an enlarged prostate gland. The prostate is a small gland that makes some of the fluid in semen. Prostate enlargement happens to almost all men as they age. It is usually not serious. BPH does not cause prostate cancer.  As the prostate gets bigger, it may partly block the flow of urine. You  may have a hard time getting a urine stream started or completely stopped. BPH can cause dribbling. You may have a weak urine stream, or you may have to urinate more often than you used to, especially at night. Most men find these problems easy to manage.  You do not need treatment unless your symptoms bother you a lot or you have other problems, such as bladder infections or stones. In these cases, medicines may help. Surgery is not needed unless the urine flow is blocked or the symptoms do not get better with medicine.  Follow-up care is a key part of your treatment and safety. Be sure to make and go to all appointments, and call your doctor if  you are having problems. It's also a good idea to know your test results and keep a list of the medicines you take.  How can you care for yourself at home?  ?? Take plenty of time to urinate. Try to relax.  ?? Try "double voiding." Urinate as much you can, relax for a few moments, and then try to urinate again.  ?? Sit on the toilet to urinate.  ?? Read or think of other things while you are waiting.  ?? Turn on a faucet, or try to picture running water. Some men find that this helps get their urine flowing.  ?? If dribbling is a problem, wash your penis daily to avoid skin irritation and infection.  ?? Avoid caffeine and alcohol. These drinks will increase how often you need to urinate. Spread your fluid intake throughout the day. If the urge to urinate often wakes you at night, limit your fluid intake in the evening. Urinate right before you go to bed.  ?? Many over-the-counter cold and allergy medicines can make the symptoms of BPH worse. Avoid antihistamines, decongestants, and allergy pills, if you can. Read the warnings on the package.  ?? If you take any prescription medicines, especially tranquilizers or antidepressants, ask your doctor or pharmacist whether they can cause urination problems. There may be other medicines you can use that do not cause urinary problems.   ?? Be safe with medicines. Take your medicines exactly as prescribed. Call your doctor if you think you are having a problem with your medicine.  When should you call for help?  Call your doctor now or seek immediate medical care if:  ? ?? You cannot urinate at all.   ? ?? You have symptoms of a urinary infection. For example:  ?? You have blood or pus in your urine.  ?? You have pain in your back just below your rib cage. This is called flank pain.  ?? You have a fever, chills, or body aches.  ?? It hurts to urinate.  ?? You have groin or belly pain.   ?Watch closely for changes in your health, and be sure to contact your doctor if:  ? ?? It hurts when you ejaculate.   ? ?? Your urinary problems get a lot worse or bother you a lot.   Where can you learn more?  Go to [employer].  Enter [phone] in the search box to learn more about "Benign Prostatic Hyperplasia: Care Instructions."  Current as of: September 08, 2015  Content Version: 11.4  ?? 2006-2017 [employer]. Care instructions adapted under license by [employer] (which disclaims liability or warranty for this information). If you have questions about a medical condition or this instruction, always ask your healthcare professional. [employer] disclaims any warranty or liability for your use of this information.       Body Mass Index: Care Instructions  Your Care Instructions    Body mass index (BMI) can help you see if your weight is raising your risk for health problems. It uses a formula to compare how much you weigh with how tall you are.  ?? A BMI lower than 18.5 is considered underweight.  ?? A BMI between 18.5 and 24.9 is considered healthy.  ?? A BMI between 25 and 29.9 is considered overweight. A BMI of 30 or higher is considered obese.  If your BMI is in the normal range, it means that you have a lower risk for weight-related health problems. If your  BMI is in the overweight or  obese range, you may be at increased risk for weight-related health problems, such as high blood pressure, heart disease, stroke, arthritis or joint pain, and diabetes. If your BMI is in the underweight range, you may be at increased risk for health problems such as fatigue, lower protection (immunity) against illness, muscle loss, bone loss, hair loss, and hormone problems.  BMI is just one measure of your risk for weight-related health problems. You may be at higher risk for health problems if you are not active, you eat an unhealthy diet, or you drink too much alcohol or use tobacco products.  Follow-up care is a key part of your treatment and safety. Be sure to make and go to all appointments, and call your doctor if you are having problems. It's also a good idea to know your test results and keep a list of the medicines you take.  How can you care for yourself at home?  ?? Practice healthy eating habits. This includes eating plenty of fruits, vegetables, whole grains, lean protein, and low-fat dairy.  ?? If your doctor recommends it, get more exercise. Walking is a good choice. Bit by bit, increase the amount you walk every day. Try for at least 30 minutes on most days of the week.  ?? Do not smoke. Smoking can increase your risk for health problems. If you need help quitting, talk to your doctor about stop-smoking programs and medicines. These can increase your chances of quitting for good.  ?? Limit alcohol to 2 drinks a day for men and 1 drink a day for women. Too much alcohol can cause health problems.  If you have a BMI higher than 25  ?? Your doctor may do other tests to check your risk for weight-related health problems. This may include measuring the distance around your waist. A waist measurement of more than 40 inches in men or 35 inches in women can increase the risk of weight-related health problems.  ?? Talk with your doctor about steps you can take to stay healthy or  improve your health. You may need to make lifestyle changes to lose weight and stay healthy, such as changing your diet and getting regular exercise.  If you have a BMI lower than 18.5  ?? Your doctor may do other tests to check your risk for health problems.  ?? Talk with your doctor about steps you can take to stay healthy or improve your health. You may need to make lifestyle changes to gain or maintain weight and stay healthy, such as getting more healthy foods in your diet and doing exercises to build muscle.  Where can you learn more?  Go to [employer]://www.healthwise.net/[employer].  Enter S176 in the search box to learn more about "Body Mass Index: Care Instructions."  Current as of: April 09, 2015  Content Version: 11.4  ?? 2006-2017 [employer]. Care instructions adapted under license by [employer] (which disclaims liability or warranty for this information). If you have questions about a medical condition or this instruction, always ask your healthcare professional. [employer] disclaims any warranty or liability for your use of this information.       Starting a Weight Loss Plan: Care Instructions  Your Care Instructions    If you are thinking about losing weight, it can be hard to know where to start. Your doctor can help you set up a weight loss plan that best meets your needs. You may want to take  a class on nutrition or exercise, or join a weight loss support group. If you have questions about how to make changes to your eating or exercise habits, ask your doctor about seeing a registered dietitian or an exercise specialist.  It can be a big challenge to lose weight. But you do not have to make huge changes at once. Make small changes, and stick with them. When those changes become habit, add a few more changes.  If you do not think you are ready to make changes right now, try to pick a date in the future. Make an appointment to see your doctor to discuss  whether the time is right for you to start a plan.  Follow-up care is a key part of your treatment and safety. Be sure to make and go to all appointments, and call your doctor if you are having problems. It's also a good idea to know your test results and keep a list of the medicines you take.  How can you care for yourself at home?  ?? Set realistic goals. Many people expect to lose much more weight than is likely. A weight loss of 5% to 10% of your body weight may be enough to improve your health.  ?? Get family and friends involved to provide support. Talk to them about why you are trying to lose weight, and ask them to help. They can help by participating in exercise and having meals with you, even if they may be eating something different.  ?? Find what works best for you. If you do not have time or do not like to cook, a program that offers meal replacement bars or shakes may be better for you. Or if you like to prepare meals, finding a plan that includes daily menus and recipes may be best.  ?? Ask your doctor about other health professionals who can help you achieve your weight loss goals.  ?? A dietitian can help you make healthy changes in your diet.  ?? An exercise specialist or personal trainer can help you develop a safe and effective exercise program.  ?? A counselor or psychiatrist can help you cope with issues such as depression, anxiety, or family problems that can make it hard to focus on weight loss.  ?? Consider joining a support group for people who are trying to lose weight. Your doctor can suggest groups in your area.  Where can you learn more?  Go to [employer]://www.healthwise.net/[employer].  Enter 319-129-2656U357 in the search box to learn more about "Starting a Weight Loss Plan: Care Instructions."  Current as of: April 09, 2015  Content Version: 11.4  ?? 2006-2017 [employer]. Care instructions adapted under license by [employer] (which disclaims liability or warranty  for this information). If you have questions about a medical condition or this instruction, always ask your healthcare professional. [employer] disclaims any warranty or liability for your use of this information.

## 2016-06-13 NOTE — Progress Notes (Signed)
Male Progress Note      Patient: Julian Bailey               Sex: male             MRN: [id number]      Date of Birth:  09-23-1945      Age:  70 y.o.               Subjective:     Julian Bailey is a 70 y.o. male who is being seen for follow-up for BPH with LUTS, incomplete bladder emptying, ED, and yearly exam.  The patient feels that his urinary symptoms have been poor lately.  He had back surgery on 10/11 and a knee replacement 10/23.  He has been on and off pain medication, which he feels may have negatively affected his urinary symptoms.  His stream has been weak at times, but it seems to be improving since stopping the pain medication.  He does feel that his bladder empties now, but it didn't before.  He has nocturia about 3 times nightly, but he admits that he wakes up for other reasons.  He takes tamsulosin 0.4 mg daily and he doesn't need refills.    No dysuria or gross hematuria.  He is having some frequency and urgency, which has also improved.    No ED with Levitra.    The patient is getting TestoPel with his IM physician and he feels they are working well for him.    We also discussed the patient's BMI today.  Body mass index is 28.05 kg/(m^2).  The patient is asked to make an attempt to improve diet and exercise patterns to aid in medical management of this problem.      He did have a Flu shot this year.  He is current on his colonoscopy.    25 minutes were spent in face-to-face time with the patient at his visit today, >50% of which were spent in counseling in treatment options and outcomes, health maintenance, dietary modifications, and answering all questions.     Past Medical History:   Diagnosis Date   ??? Arthritis    ??? BPH (benign prostatic hyperplasia)    ??? Cervical disc disease    ??? DVT (deep venous thrombosis) ([place]) x 2 2009  and 2011     after knee surgery then after long car ride    ??? ED (erectile dysfunction)    ??? Hypertension    ??? 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 Sleep apnea     CPAP       Past Surgical History:   Procedure Laterality Date   ??? HX BACK SURGERY  03/2016    Lumbar "Drill holes and cleaned it out".   ??? HX CATARACT REMOVAL Bilateral    ??? HX ENDOSCOPY      colonoscopy last was in 2008    ??? 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 HX KNEE REPLACEMENT Right        Family History   Problem Relation Age of Onset   ??? Arthritis-osteo Mother    ??? Hypertension Mother    ??? Arthritis-osteo Father    ??? Arthritis-osteo Brother    ??? Cancer Paternal Uncle      unknown   ??? Cancer Paternal Uncle      stomach   ??? Diabetes Paternal Uncle        Social History  Social History   ??? Marital status: MARRIED     Spouse name: N/A   ??? Number of children: N/A   ??? Years of education: N/A     Social History Main Topics   ??? Smoking status: Former Smoker     Packs/day: 0.50     Years: 20.00     Types: Cigarettes     Quit date: 11/30/1984   ??? Smokeless tobacco: Never Used   ??? Alcohol use No   ??? Drug use: No   ??? Sexual activity: Yes     Partners: Female     Other Topics Concern   ??? Blood Transfusions No     Social History Narrative    Medicare wellness 09/23/13        Prior to Admission medications    Medication Sig Start Date End Date Taking? Authorizing Provider   HYDROcodone-acetaminophen [facility]) 5-325 mg per tablet Take  by mouth. 04/06/16  Yes Historical Provider   docusate sodium (COLACE) 100 mg capsule Take 100 mg by mouth two (2) times a day.   Yes Historical Provider   cpap machine kit    Yes Historical Provider   diclofenac EC (VOLTAREN) 75 mg EC tablet Take 75 mg by mouth two (2) times a day.   Yes Historical Provider   tamsulosin (FLOMAX) 0.4 mg capsule Take 1 Cap by mouth daily. 11/26/15  Yes Celestine Prim P Jeromey Kruer, DO   hydrocortisone valerate (WESTCORT) 0.2 % topical cream Apply  to affected area two (2) times a day. use thin layer   Yes Historical Provider   testosterone 75 mg pllt by Implant route.   Yes Historical Provider   warfarin (COUMADIN) 5 mg tablet TAKE AS DIRECTED   Patient taking differently: TAKE AS DIRECTED. taking 51m daily. 11/26/15  Yes LHermenia Bers DO   warfarin (COUMADIN) 1 mg tablet As directed  Patient taking differently: As directed. Taking 637mdaily. 09/09/15  Yes LaHermenia BersDO   multivitamin (ONE A DAY) tablet Take 0.5 Tabs by mouth two (2) times a day.   Yes Historical Provider   oxyCODONE IR (ROXICODONE) 10 mg tab immediate release tablet Take 1 Tab by mouth every three (3) hours as needed. Max Daily Amount: 80 mg. 04/20/16   JoRiley LamMD       No Known Allergies    Immunization History   Administered Date(s) Administered   ??? Influenza Vaccine 03/26/2014   ??? Influenza Vaccine (Quad) 03/16/2016   ??? Tdap 05/13/2013         Review of Systems:  General:  Intentional weight loss  Head:   negative  Eyes:   negative  Respiratory:  Patient denies cough, chest pain, dyspnea, wheezing or hemoptysis  Cardiac:  negative  Gatrointestinal: negative  Urinary:  urinary frequency/urgency and BPH  Musculoskeletal: joint pain  Endocrine:  hypogonadism  Hematological: there is no easy bleeding or bruising  Psychological: negative  Skin:   Denies: rash, itching, hives      Objective:      Visit Vitals   ??? Ht '5\' 10"'  (1.778 m)   ??? Wt 195 lb 8 oz (88.7 kg)   ??? BMI 28.05 kg/m2         Labs:  Urinalysis:    Results for orders placed or performed in visit on 06/17/15   AMB POC URINALYSIS DIP STICK AUTO W/O MICRO     Status: None   Result Value Ref Range Status    Color (UA  POC) Yellow  Final    Clarity (UA POC) Clear  Final    Glucose (UA POC) Negative Negative Final    Bilirubin (UA POC)  Negative     Ketones (UA POC) Negative Negative Final    Specific gravity (UA POC) 1.015 1.001 - 1.035 Final    Blood (UA POC) Negative Negative Final    pH (UA POC) 7.0 4.6 - 8.0 Final    Protein (UA POC) Negative Negative mg/dL Final    Urobilinogen (UA POC)  0.2 - 1     Nitrites (UA POC) Negative Negative Final    Leukocyte esterase (UA POC) Negative Negative Final     Lab Results    Component Value Date/Time    Prostate Specific Ag 2.4 03/16/2016 10:15 AM    Prostate Specific Ag 2.9 12/09/2014 08:40 AM    Prostate Specific Ag 2.2 09/23/2013 09:35 AM        Imaging:   None reviewed.      PVR:  196 mL    SHIM Score: 23 (No ED)    AUA/QOL score:  16/3      Physical Exam:   Constitutional: Normal appearing patient with well groomed; No deformities; Body habitus appears normal; Nutritionally sound.   Neck: Supple; No JVD; No cervical lymphadenopathy; No carotid bruits; No masses; trachea midline; No thyromegaly  Respiratory: No use of accessory muscles; No intercostal retractions; Clear to auscultation bilaterally  Heart: Regular rate and rhythm; No murmurs, rubs, gallops  GI: soft, non tender, normal bowel sounds, no palpable masses, no organomegaly, overweight    GU:   Anus and perineum without any focal skin defects     Penis is circumcised without plaques, masses, scaring, or deformity    Urethral meatus is normal in size and location; No urethral lesions, discharge, or masses    Testis are descended bilaterally within the scrotum; Testis are symmetric and normal size; No masses.    Epididymides are noted bilaterally; They are normal in size and symmetry; No cyst or masses     Scrotum is without any lesions, cysts, or rashes    No inguinal lymphadenopathy and right inguinal hernia.    Digital Rectal Exam:   Prostate is 2+ (50gms) with small nodule on right mid prostate that appears soft; Rectal sphincter tone is good: No hemorrhoids present; No rectal masses    Seminal Vesicles are normal in size, symmetry without tenderness, mass, or enlargement.    Musculoskeletal:   No CVA tenderness; Good sensation, ROM, and strength bilaterally of the Upper and Lower extremities.    Assessment/Plan     1. BPH with LUTS - continue tamsulosin 0.4 mg daily. ??No refills are needed at this time.  Add finasteride 5 mg daily.  The risk of high grade Gleason 8-10 prostate cancer associated with the use of Avodart  (dutasteride) and Proscar (finasteride) were discussed with the patient.   2. Nocturia - Decrease fluids 3 hours prior to bedtime.  3. ED - Continue Levitra 10 mg and PRN.  No refills needed.  4. Frequency, improved - Patient was advised to increase water intake to at least 64 oz daily and decrease coffee, colas, teas, etc.  5. Urgency, improved - Patient was advised to increase water intake to at least 64 oz daily and decrease coffee, colas, teas, etc. ??  6. Incomplete bladder emptying - continue tamsulosin 0.4 mg daily. Possible side effects of this medication were discussed with the patient to include orthostasis and retrograde  ejaculation. The patient was advised to take this medication at least 2 hours apart from the PDE5 inhibitors.  Add finasteride 5 mg daily.  7. Right inguinal hernia - No referral at this time.  If it bothers him, we will call.  8. BMI 28 - The patient is asked to make an attempt to improve diet and exercise patterns to aid in medical management of this problem.   9. I will see back in 6 months for PVR.

## 2016-06-15 NOTE — Progress Notes (Signed)
Ortho post op call made - 3rd and final attempt.  Left msg to return call.

## 2016-06-23 NOTE — Progress Notes (Signed)
Attempted to contact patient for ortho bundle week 9/10  follow up.  No answer and no voice mail.    This note will not be viewable in MyChart.

## 2016-06-24 NOTE — Progress Notes (Signed)
Attempted to contact patient for Ortho bundle week 9/10 follow up.  No answer and no voice mail.    This note will not be viewable in MyChart.

## 2016-07-05 NOTE — Progress Notes (Signed)
Resolved    This note will not be viewable in MyChart.    Attempted to contact patient for ortho bundle week 11/12 follow up.  No answer and no voice mail.    Discussed closing care management services at this time, pt was agreeable.   Pt has been provided with this community care coordinator's  contact information.  Explained to pt that this community care Coordinator will remain available for any future questions or assistance with healthcare barriers, pt voiced understanding.      closed due to not being able to reach pt

## 2016-09-13 ENCOUNTER — Inpatient Hospital Stay: Admit: 2016-09-13 | Payer: MEDICARE | Primary: Family Medicine

## 2016-09-13 ENCOUNTER — Ambulatory Visit: Admit: 2016-09-13 | Discharge: 2016-09-13 | Payer: MEDICARE | Attending: Family Medicine | Primary: Family Medicine

## 2016-09-13 DIAGNOSIS — I10 Essential (primary) hypertension: Secondary | ICD-10-CM

## 2016-09-13 DIAGNOSIS — D6859 Other primary thrombophilia: Secondary | ICD-10-CM

## 2016-09-13 LAB — METABOLIC PANEL, BASIC
Anion gap: 6 mmol/L (ref 6–15)
BUN/Creatinine ratio: 18 (ref 7–25)
BUN: 16 MG/DL (ref 7–18)
CO2: 29 mmol/L (ref 21–32)
Calcium: 9.5 MG/DL (ref 8.5–10.1)
Chloride: 108 mmol/L — ABNORMAL HIGH (ref 98–107)
Creatinine: 0.88 MG/DL (ref 0.60–1.30)
GFR est AA: >60 mL/min/{1.73_m2} (ref >60)
GFR est non-AA: >60 mL/min/{1.73_m2} (ref >60)
Glucose: 87 mg/dL (ref 70–110)
Potassium: 4.3 mmol/L (ref 3.5–5.3)
Sodium: 143 mmol/L (ref 136–145)

## 2016-09-13 LAB — CBC WITH AUTOMATED DIFF
ABS. BASOPHILS: 0 10*3/uL (ref 0.0–0.1)
ABS. EOSINOPHILS: 0 10*3/uL (ref 0.0–0.5)
ABS. IMM. GRANS.: 0 10*3/uL
ABS. LYMPHOCYTES: 0.7 10*3/uL — ABNORMAL LOW (ref 0.8–3.5)
ABS. MONOCYTES: 0.4 10*3/uL — ABNORMAL LOW (ref 0.8–3.5)
ABS. NEUTROPHILS: 3.5 10*3/uL (ref 1.5–8.0)
BASOPHILS: 1 % (ref 0–2)
EOSINOPHILS: 0 % (ref 0–5)
HCT: 42.4 % (ref 41–53)
HGB: 14.8 g/dL (ref 13.5–17.5)
IMMATURE GRANULOCYTES: 0 % — ABNORMAL LOW (ref 2–10)
LYMPHOCYTES: 15 % — ABNORMAL LOW (ref 19–48)
MCH: 31.6 PG — ABNORMAL HIGH (ref 27–31)
MCHC: 34.9 g/dL (ref 31–37)
MCV: 90.6 FL (ref 80–100)
MONOCYTES: 9 % (ref 3–9)
MPV: 10.8 FL — ABNORMAL HIGH (ref 5.9–10.3)
NEUTROPHILS: 75 % — ABNORMAL HIGH (ref 40–74)
PLATELET: 126 10*3/uL — ABNORMAL LOW (ref 130–400)
RBC: 4.68 M/uL — ABNORMAL LOW (ref 4.7–6.1)
RDW: 14.6 % — ABNORMAL HIGH (ref 11.5–14.5)
WBC: 4.7 10*3/uL (ref 4.5–10.8)

## 2016-09-13 LAB — MAGNESIUM: Magnesium: 2.5 mg/dL — ABNORMAL HIGH (ref 1.8–2.4)

## 2016-09-13 NOTE — Progress Notes (Signed)
Overall labs work looks good; there are a few labs that are outside the standard normal range, clinical significance doubtful  Platelets stable   Donnella BiLauren E Aayat Hajjar, DO

## 2016-09-13 NOTE — Progress Notes (Signed)
HISTORY OF PRESENT ILLNESS  Julian Bailey is a 71 y.o. male.    has a past medical history of Arthritis; BPH (benign prostatic hyperplasia); Cervical disc disease; DVT (deep venous thrombosis) ([place]) (x 2 2009  and 2011 ); ED (erectile dysfunction); Hypercoagulopathy ([place]); Hypertension; Recurrent deep vein thrombosis (DVT) ([place]); Sleep apnea; and Thromboembolus ([place]).    Chief Complaint   Patient presents with   ??? Follow Up Chronic Condition         HPI  The patient is a very pleasant 49 -year-old white male with the above listed past medical history who presents to the office today to follow up care.      The patient presents to follow up for longstanding hypertension. He has OSA- was diagnosed before the weight loss. He has maintained his  20 lbs weight loss since his last visit.  Need to see if he still needs CPAP - Again has lost 20 lbs and has been unable to tolerate CPAP nightly. . Wife states he is no longer snoring and has not witnessed any apnea events            and has maintained adequate BP control without his lisinopril .   Pt is a  is taking BP at home has been well controlled. States its never higher than  Averages 130/80.   He denies any symptoms referable to hypertension. This would include but is not limited to headache, chest pain, shortness of breath, nausea, vomiting and neurological deficits.    BP Readings from Last 3 Encounters:   09/13/16 122/80   06/13/16 113/70   05/02/16 140/80     Wt Readings from Last 3 Encounters:   09/13/16 197 lb (89.4 kg)   06/13/16 195 lb 8 oz (88.7 kg)   04/27/16 211 lb (95.7 kg)       The patient presents to follow up for chronic anticoagulation on Coumadin.  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  The patient takes his PT and INR at home and the numbers are called or faxed to the office.  He has had stable levels for several months.  He is doing well, no signs of bleeding, no changes in diet, no recent illness.        The patient also presents to follow up today for longstanding elevated cholesterol.  He has not had his cholesterol checked in several months.  He has been trying to follow a diet closely.  About to have back and knee surgery so not exercising as much - but very careful with diet. Marland Kitchen  He is trying to be more active.      Lab Results   Component Value Date/Time    Cholesterol, total 147 03/16/2016 10:15 AM    HDL Cholesterol 37 03/16/2016 10:15 AM    LDL, calculated 83.76 03/16/2016 10:15 AM    VLDL, calculated 32.8 (H) 03/16/2016 10:15 AM    Triglyceride 164 (H) 03/16/2016 10:15 AM       Overall the patient states the is doing quite well.  Discussed the patient's above normal BMI with him. Body mass index is 28.27 kg/(m^2).    I have recommended the following interventions: weight loss from baseline weight .  The BMI follow up plan is as follows: BMI is out of normal parameters and plan is as follows: I have counseled this patient on diet and exercise regimens      Wt Readings from Last 3 Encounters:   09/13/16  197 lb (89.4 kg)   06/13/16 195 lb 8 oz (88.7 kg)   04/27/16 211 lb (95.7 kg)       Lab Results   Component Value Date/Time    Cholesterol, total 147 03/16/2016 10:15 AM    HDL Cholesterol 37 03/16/2016 10:15 AM    LDL, calculated 83.76 03/16/2016 10:15 AM    VLDL, calculated 32.8 (H) 03/16/2016 10:15 AM    Triglyceride 164 (H) 03/16/2016 10:15 AM       The patient does not have known history of coronary artery disease.      Sees Dr. Abner Greenspan for ED and BPH - Needs PSA   Lab Results   Component Value Date/Time    Prostate Specific Ag 2.4 03/16/2016 10:15 AM    Prostate Specific Ag 2.9 12/09/2014 08:40 AM    Prostate Specific Ag 2.2 09/23/2013 09:35 AM              Preventative Health    Last eye exam done by [facility] - will get records release             Review of Systems   Constitutional: Negative for fever, chills, weight loss, malaise/fatigue and diaphoresis.    HENT: Negative for congestion and sore throat.    Eyes: Negative for blurred vision and photophobia.   Respiratory: Negative for cough and shortness of breath.    Cardiovascular: Negative for chest pain, palpitations and leg swelling.   Gastrointestinal: Negative for heartburn, nausea, vomiting, abdominal pain, diarrhea, constipation, blood in stool and melena.   Genitourinary: Negative for dysuria.   Musculoskeletal: Negative for myalgias. +joint pain   Skin: Postive for rash- Seeing Dr. Diamantina Providence -- recurrent  Contact Dermatitis    Neurological: Negative for dizziness, loss of consciousness, weakness and headaches.   Psychiatric/Behavioral: Negative.      Past Medical History:   Diagnosis Date   ??? Arthritis    ??? BPH (benign prostatic hyperplasia)    ??? Cervical disc disease    ??? DVT (deep venous thrombosis) ([place]) x 2 2009  and 2011     after knee surgery then after long car ride    ??? ED (erectile dysfunction)    ??? Hypercoagulopathy ([place])    ??? Hypertension    ??? 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 Sleep apnea     CPAP   ??? [facility]olus -[facility])      Past Surgical History:   Procedure Laterality Date   ??? HX BACK SURGERY  03/2016    Lumbar "Drill holes and cleaned it out".   ??? HX CATARACT REMOVAL Bilateral    ??? HX ENDOSCOPY      colonoscopy last was in 2008    ??? 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 HX KNEE REPLACEMENT Right      Family History   Problem Relation Age of Onset   ??? Arthritis-osteo Mother    ??? Hypertension Mother    ??? Arthritis-osteo Father    ??? Arthritis-osteo Brother    ??? Cancer Paternal Uncle      unknown   ??? Cancer Paternal Uncle      stomach   ??? Diabetes Paternal Uncle      Social History     Social History   ??? Marital status: MARRIED     Spouse name: N/A   ??? Number of children: N/A   ??? Years of education: N/A     Occupational History   ???  Not on file.     Social History Main Topics   ??? Smoking status: Former Smoker     Packs/day: 0.50     Years: 20.00     Types: Cigarettes      Quit date: 11/30/1984   ??? Smokeless tobacco: Never Used   ??? Alcohol use No   ??? Drug use: No   ??? Sexual activity: Yes     Partners: Female     Other Topics Concern   ??? Blood Transfusions No     Social History Narrative    Medicare wellness 09/23/13      No Known Allergies  Current Outpatient Prescriptions on File Prior to Visit   Medication Sig Dispense Refill   ??? finasteride (PROSCAR) 5 mg tablet Take 1 Tab by mouth daily. 90 Tab 3   ??? docusate sodium (COLACE) 100 mg capsule Take 100 mg by mouth two (2) times a day.     ??? diclofenac EC (VOLTAREN) 75 mg EC tablet Take 75 mg by mouth two (2) times a day.     ??? tamsulosin (FLOMAX) 0.4 mg capsule Take 1 Cap by mouth daily. 90 Cap 3   ??? hydrocortisone valerate (WESTCORT) 0.2 % topical cream Apply  to affected area two (2) times a day. use thin layer     ??? testosterone 75 mg pllt by Implant route.     ??? warfarin (COUMADIN) 5 mg tablet TAKE AS DIRECTED (Patient taking differently: TAKE AS DIRECTED. taking 25m daily.) 90 Tab 3   ??? warfarin (COUMADIN) 1 mg tablet As directed (Patient taking differently: As directed. Taking 615mdaily.) 180 Tab 2   ??? multivitamin (ONE A DAY) tablet Take 0.5 Tabs by mouth two (2) times a day.     ??? cpap machine kit        No current facility-administered medications on file prior to visit.        Vitals:    09/13/16 0809   BP: 122/80   Pulse: 72   Resp: 18   Temp: 98 ??F (36.7 ??C)   TempSrc: Oral   SpO2: 98%   Weight: 197 lb (89.4 kg)   Height: _0  (1.778 m)   .  Body mass index is 28.27 kg/(m^2).            Physical Exam   Nursing note and vitals reviewed.  Constitutional: He is oriented to person, place, and time. He appears well-developed and well-nourished. No distress.   HENT:   Head: Normocephalic and atraumatic.   Right Ear: External ear normal.   Left Ear: External ear normal.   Nose: Nose normal.   Mouth/Throat: Oropharynx is clear and moist. No oropharyngeal exudate.    Eyes: Conjunctivae are normal. Pupils are equal, round, and reactive to light. No scleral icterus.   Neck: Normal range of motion. Neck supple. No JVD present. No tracheal deviation present.   Cardiovascular: Normal rate, regular rhythm, normal heart sounds and intact distal pulses.  Exam reveals no gallop and no friction rub.    No murmur heard.  Pulmonary/Chest: Effort normal and breath sounds normal. No respiratory distress.   Abdominal: Soft. Bowel sounds are normal. He exhibits no distension and no mass. There is no tenderness.   Musculoskeletal:   Knee (r) crepitus - otherwise unremarkable   Lymphadenopathy:     He has no cervical adenopathy.   Neurological: He is alert and oriented to person, place, and time.   Skin: Skin is warm and  dry. He has bilateral hemosiderin pigmentation, with no leg hair, and ulcerating rashes on LE being followed by Derm, without evidence of cellulitis.   Erythematous rash on extremities   Psychiatric: He has a normal mood and affect. His behavior is normal.       ASSESSMENT and PLAN    ICD-10-CM ICD-9-CM    1. Essential hypertension I10 401.9 CBC WITH AUTOMATED DIFF      METABOLIC PANEL, BASIC      MAGNESIUM   2. Hypercoagulopathy (HCC) D68.59 289.81 CBC WITH AUTOMATED DIFF      METABOLIC PANEL, BASIC      MAGNESIUM   3. Dyslipidemia E78.5 272.4 CBC WITH AUTOMATED DIFF      METABOLIC PANEL, BASIC      MAGNESIUM   4. OSA (obstructive sleep apnea) G47.33 327.23         Sleep Study to see if he still needs CPAP as weight loss might have cured OSA   Continue coumadin   Continue with specialist    Follow-up Disposition: 6 months   lab results and schedule of future lab studies reviewed with patient  reviewed diet, exercise and weight control  cardiovascular risk and specific lipid/LDL goals reviewed  reviewed medications and side effects in detail      I have discussed the above plan with the patient. Patient has also been  given instructions and information on his conditions.  Patient is aware of all possible side effects and agrees with the above mentioned plan.     Recommended sodium restriction. Reviewed diet, exercise and weight control.     Recommend 1800 cal low cholesterol (low fat) lean protein whole grain diet           Hermenia Bers, DO

## 2016-09-13 NOTE — Patient Instructions (Addendum)
[facility]  Office working hours are Monday - Friday 8:00AM-5:00PM.  Office phone number is [phone] and fax is [phone]  For non-emergent medical care and clinical advice during office hours:   1. Call office or   2.  Send message or request using MyChart  For non-emergent medical care and clinical advice after office hours:  1.  Send message or request using MyChart   2.  Call [phone] and leave a message with our answering service or    Emergency care can be obtained at the [facility] ER, Urgent Care or by calling 911.    Patient Satisfaction Survey  As a valued patient, you will be receiving a survey from [employer].  We encourage you to share your thoughts and opinions about the care you received today. Thank you for choosing [facility]           BRING ALL MEDICATION BOTTLES TO EVERY APPOINTMENT    Medication Refills -   * Please do not let your medication run out before calling for a refill, the best practice would be to call when you have 2 weeks of medication left.  * Please allow 48 hours for medications to be called into your pharmacy when you request a medication refill      Have MyChart? Use it to request medication refills, it's quick and easy! If you have questions about MyChart please feel free to ask our staff!     Learning About Dietary Guidelines  What are the Dietary Guidelines for Americans?    Dietary Guidelines for Americans provide tips for eating well and staying healthy. This helps reduce the risk for long-term (chronic) diseases.  These adult guidelines from the [place] government recommend that you:  ?? Eat lots of fruits, vegetables, whole grains, and low-fat or nonfat dairy products.  ?? Try to balance your eating with your activity. This helps you stay at a healthy weight.  ?? Drink alcohol in moderation, if at all.  ?? Limit foods high in salt, saturated fat, trans fat, and added sugar.  What is MyPlate?   MyPlate is the [place] government's food guide. It can help you make your own well-balanced eating plan. A balanced eating plan means that you eat enough, but not too much, and that your food gives you the nutrients you need to stay healthy.  MyPlate focuses on eating plenty of whole grains, fruits, and vegetables, and on limiting fat and sugar. It is available online at www.ChooseMyPlate.gov.  How can you get started?  MyPlate suggests that most adults eat certain amounts from the different food groups:  Grains  Eat 5 to 8 ounces of grains each day. Half of those should be whole grains. Choose whole-grain breads, cold and cooked cereals and grains, pasta (without creamy sauces), hard rolls, or low-fat or fat-free crackers.  Vegetables  Eat 2 to 3 cups of vegetables every day. They contain little if any fat. And they have lots of nutrients that help protect against heart disease.  Fruits  Eat 1?? to 2 cups of fruits every day. Fruits contain very little fat but lots of nutrients.  Protein foods  Most adults need 5 to 6?? ounces each day. Choose fish and lean poultry more often. Eat red meat and fried meats less often. Dried beans, tofu, and nuts are also good sources of protein.  Dairy  Most adults need 3 cups of milk and milk products a day. Choose low-fat or fat-free products from   this food group. If you have problems digesting milk, try eating cheese or yogurt instead.  Limit fats and oils, including those used in cooking. When you do use fats, choose oils that are liquid at room temperature (unsaturated fats). These include canola oil and olive oil. Avoid foods with trans fats, such as many fried foods, cookies, and snack foods.  Where can you learn more?  Go to InsuranceStats.ca.  Enter 618-461-8235 in the search box to learn more about "Learning About Dietary Guidelines."  Current as of: Nov 06, 2015  Content Version: 11.4  ?? 2006-2017 Healthwise, Incorporated. Care instructions adapted under  license by [employer] (which disclaims liability or warranty for this information). If you have questions about a medical condition or this instruction, always ask your healthcare professional. Healthwise, Incorporated disclaims any warranty or liability for your use of this information.       Eating Healthy Foods: Care Instructions  Your Care Instructions    Eating healthy foods can help lower your risk for disease. Healthy food gives you energy and keeps your heart strong, your brain active, your muscles working, and your bones strong.  A healthy diet includes a variety of foods from the basic food groups: grains, vegetables, fruits, milk and milk products, and meat and beans. Some people may eat more of their favorite foods from only one food group and, as a result, miss getting the nutrients they need. So, it is important to pay attention not only to what you eat but also to what you are missing from your diet. You can eat a healthy, balanced diet by making a few small changes.  Follow-up care is a key part of your treatment and safety. Be sure to make and go to all appointments, and call your doctor if you are having problems. It's also a good idea to know your test results and keep a list of the medicines you take.  How can you care for yourself at home?  Look at what you eat  ?? Keep a food diary for a week or two and record everything you eat or drink. Track the number of servings you eat from each food group.  ?? For a balanced diet every day, eat a variety of:  ?? 6 or more ounce-equivalents of grains, such as cereals, breads, crackers, rice, or pasta, every day. An ounce-equivalent is 1 slice of bread, 1 cup of ready-to-eat cereal, or ?? cup of cooked rice, cooked pasta, or cooked cereal.  ?? 2?? cups of vegetables, especially:  ?? Dark-green vegetables such as broccoli and spinach.  ?? Orange vegetables such as carrots and sweet potatoes.   ?? Dry beans (such as pinto and kidney beans) and peas (such as lentils).  ?? 2 cups of fresh, frozen, or canned fruit. A small apple or 1 banana or orange equals 1 cup.  ?? 3 cups of nonfat or low-fat milk, yogurt, or other milk products.  ?? 5?? ounces of meat and beans, such as chicken, fish, lean meat, beans, nuts, and seeds. One egg, 1 tablespoon of peanut butter, ?? ounce nuts or seeds, or ?? cup of cooked beans equals 1 ounce of meat.  ?? Learn how to read food labels for serving sizes and ingredients. Fast-food and convenience-food meals often contain few or no fruits or vegetables. Make sure you eat some fruits and vegetables to make the meal more nutritious.  ?? Look at your food diary. For each food group, add up what you  have eaten and then divide the total by the number of days. This will give you an idea of how much you are eating from each food group. See if you can find some ways to change your diet to make it more healthy.  Start small  ?? Do not try to make dramatic changes to your diet all at once. You might feel that you are missing out on your favorite foods and then be more likely to fail.  ?? Start slowly, and gradually change your habits. Try some of the following:  ?? Use whole wheat bread instead of white bread.  ?? Use nonfat or low-fat milk instead of whole milk.  ?? Eat brown rice instead of white rice, and eat whole wheat pasta instead of white-flour pasta.  ?? Try low-fat cheeses and low-fat yogurt.  ?? Add more fruits and vegetables to meals and have them for snacks.  ?? Add lettuce, tomato, cucumber, and onion to sandwiches.  ?? Add fruit to yogurt and cereal.  Enjoy food  ?? You can still eat your favorite foods. You just may need to eat less of them. If your favorite foods are high in fat, salt, and sugar, limit how often you eat them, but do not cut them out entirely.  ?? Eat a wide variety of foods.  Make healthy choices when eating out   ?? The type of restaurant you choose can help you make healthy choices. Even fast-food chains are now offering more low-fat or healthier choices on the menu.  ?? Choose smaller portions, or take half of your meal home.  ?? When eating out, try:  ?? A veggie pizza with a whole wheat crust or grilled chicken (instead of sausage or pepperoni).  ?? Pasta with roasted vegetables, grilled chicken, or marinara sauce instead of cream sauce.  ?? A vegetable wrap or grilled chicken wrap.  ?? Broiled or poached food instead of fried or breaded items.  Make healthy choices easy  ?? Buy packaged, prewashed, ready-to-eat fresh vegetables and fruits, such as baby carrots, salad mixes, and chopped or shredded broccoli and cauliflower.  ?? Buy packaged, presliced fruits, such as melon or pineapple.  ?? Choose 100% fruit or vegetable juice instead of soda. Limit juice intake to 4 to 6 oz (?? to ?? cup) a day.  ?? Blend low-fat yogurt, fruit juice, and canned or frozen fruit to make a smoothie for breakfast or a snack.  Where can you learn more?  Go to InsuranceStats.cahttp://www.healthwise.net/GoodHelpConnections.  Enter T756 in the search box to learn more about "Eating Healthy Foods: Care Instructions."  Current as of: Nov 06, 2015  Content Version: 11.4  ?? 2006-2017 Healthwise, Inc[employer]. Care instructions adapted under license by [employer] (which disclaims liability or warranty for this information). If you have questions about a medical condition or this instruction, always ask your healthcare professional. Healthwise, Incorporated disclaims any warranty or liability for your use of this information.

## 2016-09-14 NOTE — Progress Notes (Signed)
Patient notified, voiced understanding.

## 2016-10-03 ENCOUNTER — Encounter

## 2016-10-03 NOTE — Telephone Encounter (Signed)
Patient wants refills on Warfarin . Last seen on 09/13/2016

## 2016-10-04 MED ORDER — WARFARIN 1 MG TAB
1 mg | ORAL_TABLET | ORAL | 2 refills | Status: DC
Start: 2016-10-04 — End: 2017-02-06

## 2016-10-11 ENCOUNTER — Encounter

## 2016-10-18 ENCOUNTER — Inpatient Hospital Stay: Admit: 2016-10-18 | Payer: MEDICARE | Attending: Orthopaedic Surgery | Primary: Family Medicine

## 2016-10-18 DIAGNOSIS — M1611 Unilateral primary osteoarthritis, right hip: Secondary | ICD-10-CM

## 2016-10-18 MED ORDER — TRIAMCINOLONE ACETONIDE 40 MG/ML SUSP FOR INJECTION
40 mg/mL | Freq: Once | INTRAMUSCULAR | Status: AC
Start: 2016-10-18 — End: 2016-10-18
  Administered 2016-10-18: 14:00:00 via INTRA_ARTICULAR

## 2016-10-18 MED ORDER — BUPIVACAINE (PF) 0.75 % (7.5 MG/ML) IJ SOLN
0.75 % (7.5 mg/mL) | Freq: Once | INTRAMUSCULAR | Status: AC
Start: 2016-10-18 — End: 2016-10-18
  Administered 2016-10-18: 14:00:00 via INTRA_ARTICULAR

## 2016-10-18 MED ORDER — IOPAMIDOL 61 % IV SOLN
300 mg iodine /mL (61 %) | Freq: Once | INTRAVENOUS | Status: AC
Start: 2016-10-18 — End: 2016-10-18
  Administered 2016-10-18: 14:00:00 via INTRA_ARTICULAR

## 2016-10-18 MED ORDER — LIDOCAINE HCL 2 % (20 MG/ML) IJ SOLN
20 mg/mL (2 %) | Freq: Once | INTRAMUSCULAR | Status: AC
Start: 2016-10-18 — End: 2016-10-18
  Administered 2016-10-18: 14:00:00 via INTRADERMAL

## 2016-10-18 MED ORDER — LIDOCAINE HCL 1 % (10 MG/ML) IJ SOLN
10 mg/mL (1 %) | Freq: Once | INTRAMUSCULAR | Status: AC
Start: 2016-10-18 — End: 2016-10-18
  Administered 2016-10-18: 14:00:00 via INTRA_ARTICULAR

## 2016-10-18 MED FILL — ISOVUE-300  61 % INTRAVENOUS SOLUTION: 300 mg iodine /mL (61 %) | INTRAVENOUS | Qty: 30

## 2016-10-18 MED FILL — LIDOCAINE HCL 2 % (20 MG/ML) IJ SOLN: 20 mg/mL (2 %) | INTRAMUSCULAR | Qty: 10

## 2016-11-10 NOTE — Telephone Encounter (Signed)
What hospital was his prior Surgeon located at?  Donnella BiLauren E Daryn Pisani, DO

## 2016-11-10 NOTE — Telephone Encounter (Signed)
Let him know that [facility] will have to [facility]     And even then I may have to see him first and order up dated imaging     Let him know that We will start getting records now     Also what specifically is he wanting referral for

## 2016-11-10 NOTE — Telephone Encounter (Signed)
Dr. Elenore [facility]addyWiecker was at [facility]. Said [facility] a Midwifeneurosurgeon but that [facility] will be fine.

## 2016-11-10 NOTE — Telephone Encounter (Signed)
Patient stated he is having tingling and numbness in right leg along with pain in feet.  Stated he had symptoms prior to surgery.  No new symptoms just recurring symptoms as before.  Patient notified of need for records and may require appt and further testing

## 2016-11-10 NOTE — Telephone Encounter (Signed)
Wants referral to another neurosurgeon.  Said Dr. Ian BushmanWieckert did his surgery before but that he is gone now.

## 2016-11-11 ENCOUNTER — Encounter

## 2016-11-11 NOTE — Telephone Encounter (Signed)
Referral placed for patient.

## 2016-11-24 NOTE — Telephone Encounter (Signed)
Let pt know   Donnella BiLauren E Mykela Mewborn, DO

## 2016-11-24 NOTE — Telephone Encounter (Signed)
Nash DimmerKerry, from Referrals, called.  Ron AgeeSaid [facility] will not schedule Tammy SoursGreg until he has a recent MRI with contrast.

## 2016-12-01 MED ORDER — TAMSULOSIN SR 0.4 MG 24 HR CAP
0.4 mg | ORAL_CAPSULE | Freq: Every day | ORAL | 3 refills | Status: DC
Start: 2016-12-01 — End: 2017-05-03

## 2016-12-12 ENCOUNTER — Ambulatory Visit: Admit: 2016-12-12 | Payer: MEDICARE | Attending: Urology | Primary: Family Medicine

## 2016-12-12 DIAGNOSIS — N401 Enlarged prostate with lower urinary tract symptoms: Secondary | ICD-10-CM

## 2016-12-12 LAB — AMB POC URINALYSIS DIP STICK AUTO W/O MICRO
Blood (UA POC): NEGATIVE
Glucose (UA POC): NEGATIVE
Ketones (UA POC): NEGATIVE
Leukocyte esterase (UA POC): NEGATIVE
Nitrites (UA POC): NEGATIVE
Protein (UA POC): NEGATIVE
Specific gravity (UA POC): 1.02 (ref 1.001–1.035)
Urobilinogen (UA POC): 0.2 (ref 0.2–1)
pH (UA POC): 7 (ref 4.6–8.0)

## 2016-12-12 NOTE — Telephone Encounter (Signed)
Patient notified, appointment scheduled.

## 2016-12-12 NOTE — Progress Notes (Signed)
Male Progress Note      Patient: Julian Bailey               Sex: male             MRN: [id number]      Date of Birth:  08/27/1945      Age:  71 y.o.               Subjective:     Julian Bailey is a 71 y.o. male who is being seen for follow-up for BPH with LUTS, incomplete bladder emptying, ED.  The patient feels that his urinary symptoms have been poor lately.  He had back surgery on 10/11 and a knee replacement 10/23.  He has been on and off pain medication, which he feels may have negatively affected his urinary symptoms.  His stream has been weak at times, but it seems to be improving since stopping the pain medication an by the addition of proscar.  He does feel that his bladder empties now, but it didn't before.  He has nocturia about 2 times nightly, but he admits that he wakes up for other reasons.  He takes tamsulosin 0.4 mg daily and proscar, but he doesn't need refills.  No dysuria or gross hematuria.  He is having some frequency and urgency, which has also improved.  He does wish to move forward with the Urolift evaluation.    No ED with Levitra.    The patient is getting TestoPel with his IM physician and he feels they are working well for him.    We also discussed the patient's BMI today.  Body mass index is 28.12 kg/(m^2).  The patient is asked to make an attempt to improve diet and exercise patterns to aid in medical management of this problem.       He is current on his colonoscopy.    15 minutes were spent in face-to-face time with the patient at his visit today, >50% of which were spent in counseling in treatment options and outcomes, health maintenance, dietary modifications, and answering all questions.     Past Medical History:   Diagnosis Date   ??? Arthritis    ??? BPH (benign prostatic hyperplasia)    ??? Cervical disc disease    ??? DVT (deep venous thrombosis) ([place]) x 2 2009  and 2011     after knee surgery then after long car ride    ??? ED (erectile dysfunction)     ??? Hypercoagulopathy ([place])    ??? Hypertension    ??? 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 Sleep apnea     CPAP   ??? 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 HX BACK SURGERY  03/2016    Lumbar "Drill holes and cleaned it out".   ??? HX CATARACT REMOVAL Bilateral    ??? HX ENDOSCOPY      colonoscopy last was in 2008    ??? 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 HX KNEE REPLACEMENT Right        Family History   Problem Relation Age of Onset   ??? Arthritis-osteo Mother    ??? Hypertension Mother    ??? Arthritis-osteo Father    ??? Arthritis-osteo Brother    ??? Cancer Paternal Uncle      unknown   ??? Cancer Paternal Uncle      stomach   ???  Diabetes Paternal Uncle        Social History     Social History   ??? Marital status: MARRIED     Spouse name: N/A   ??? Number of children: N/A   ??? Years of education: N/A     Social History Main Topics   ??? Smoking status: Former Smoker     Packs/day: 0.50     Years: 20.00     Types: Cigarettes     Quit date: 11/30/1984   ??? Smokeless tobacco: Never Used   ??? Alcohol use No   ??? Drug use: No   ??? Sexual activity: Yes     Partners: Female     Other Topics Concern   ??? Blood Transfusions No     Social History Narrative    Medicare wellness 09/23/13        Prior to Admission medications    Medication Sig Start Date End Date Taking? Authorizing Provider   tamsulosin (FLOMAX) 0.4 mg capsule Take 1 Cap by mouth daily. 12/01/16  Yes Rachell Druckenmiller P Yulianna Folse, DO   warfarin (COUMADIN) 1 mg tablet As directed 10/04/16  Yes Lauren Dinah Beers, DO   finasteride (PROSCAR) 5 mg tablet Take 1 Tab by mouth daily. 06/13/16  Yes Alfreddie Consalvo P Syla Devoss, DO   cpap machine kit    Yes Historical Provider   hydrocortisone valerate (WESTCORT) 0.2 % topical cream Apply  to affected area two (2) times a day. use thin layer   Yes Historical Provider   testosterone 75 mg pllt by Implant route.   Yes Historical Provider   warfarin (COUMADIN) 5 mg tablet TAKE AS DIRECTED   Patient taking differently: TAKE AS DIRECTED. taking 64m daily. 11/26/15  Yes Lauren EDinah Beers DO   multivitamin (ONE A DAY) tablet Take 0.5 Tabs by mouth two (2) times a day.   Yes Historical Provider       No Known Allergies    Immunization History   Administered Date(s) Administered   ??? Influenza Vaccine 03/26/2014   ??? Influenza Vaccine (Quad) 03/16/2016   ??? Tdap 05/13/2013         Review of Systems:  General:  Intentional weight loss  Head:   negative  Eyes:   negative  Respiratory:  Patient denies cough, chest pain, dyspnea, wheezing or hemoptysis  Cardiac:  negative  Gatrointestinal: negative  Urinary:  urinary frequency/urgency and BPH  Musculoskeletal: joint pain  Endocrine:  hypogonadism  Hematological: there is no easy bleeding or bruising  Psychological: negative  Skin:   Denies: rash, itching, hives      Objective:      Visit Vitals   ??? BP 129/80 (BP 1 Location: Left arm, BP Patient Position: Sitting)   ??? Pulse 86   ??? Temp 98.2 ??F (36.8 ??C) (Temporal)   ??? Resp 18   ??? Wt 196 lb (88.9 kg)   ??? SpO2 98%   ??? BMI 28.12 kg/m2         Labs:  Urinalysis:    Results for orders placed or performed in visit on 12/12/16   AMB POC URINALYSIS DIP STICK AUTO W/O MICRO     Status: None   Result Value Ref Range Status    Color (UA POC) Yellow  Final    Clarity (UA POC) Clear  Final    Glucose (UA POC) Negative Negative Final    Bilirubin (UA POC) 1+ Negative Final    Ketones (UA POC) Negative Negative Final  Specific gravity (UA POC) 1.020 1.001 - 1.035 Final    Blood (UA POC) Negative Negative Final    pH (UA POC) 7.0 4.6 - 8.0 Final    Protein (UA POC) Negative Negative Final    Urobilinogen (UA POC) 0.2 mg/dL 0.2 - 1 Final    Nitrites (UA POC) Negative Negative Final    Leukocyte esterase (UA POC) Negative Negative Final       Lab Results   Component Value Date/Time    Prostate Specific Ag 2.4 03/16/2016 10:15 AM    Prostate Specific Ag 2.9 12/09/2014 08:40 AM    Prostate Specific Ag 2.2 09/23/2013 09:35 AM         Imaging:   None reviewed.      PVR:  Was 196 mL    SHIM Score: 24 was 23 (No ED)    AUA/QOL score:  12/3 was 16/3      Physical Exam:   Constitutional: Normal appearing patient with well groomed; No deformities; Body habitus appears normal; Nutritionally sound.   Neck: Supple; No JVD; No cervical lymphadenopathy; No carotid bruits; No masses; trachea midline; No thyromegaly  Respiratory: No use of accessory muscles; No intercostal retractions; Clear to auscultation bilaterally  Heart: Regular rate and rhythm; No murmurs, rubs, gallops  GI: soft, non tender, normal bowel sounds, no palpable masses, no organomegaly, overweight    GU:   Deferred    Digital Rectal Exam:   Deferred    Musculoskeletal:   No CVA tenderness; Good sensation, ROM, and strength bilaterally of the Upper and Lower extremities.    Assessment/Plan     1. BPH with LUTS - continue tamsulosin 0.4 mg daily. ??Continue finasteride 5 mg daily.  The risk of high grade Gleason 8-10 prostate cancer associated with the use of Avodart (dutasteride) and Proscar (finasteride) were discussed with the patient.   2. Nocturia - Decrease fluids 3 hours prior to bedtime.  3. ED - Continue Levitra 10 mg and PRN.  No refills needed.  4. Incomplete bladder emptying - continue tamsulosin 0.4 mg daily. Possible side effects of this medication were discussed with the patient to include orthostasis and retrograde ejaculation. The patient was advised to take this medication at least 2 hours apart from the PDE5 inhibitors.  Add finasteride 5 mg daily.  5. BMI 28 - The patient is asked to make an attempt to improve diet and exercise patterns to aid in medical management of this problem.   Marland Kitchen

## 2016-12-12 NOTE — Patient Instructions (Addendum)
[facility]     Office working hours are 8:00am-4:30pm    Office phone number is [phone] and the fax line is [phone]  For non-emergent medical care and clinical advice during office hours  1. Call the office or  2. Send a message to office staff via mychart  Emergency care can be obtained at the [facility] ER, Urgent Care or 911.    Patient Satisfaction Survey   We appreciate you sharing your e-mail address with us. Please watch for our patient  satisfaction survey which you will receive by e-mail. We strive to provide you with the   best care possible. We respect all comments and will take them into consideration to improve our service.  Thank you for your participation.         Benign Prostatic Hyperplasia: Care Instructions  Your Care Instructions    Benign prostatic hyperplasia, or BPH, is an enlarged prostate gland. The prostate is a small gland that makes some of the fluid in semen. Prostate enlargement happens to almost all men as they age. It is usually not serious. BPH does not cause prostate cancer.  As the prostate gets bigger, it may partly block the flow of urine. You may have a hard time getting a urine stream started or completely stopped. BPH can cause dribbling. You may have a weak urine stream, or you may have to urinate more often than you used to, especially at night. Most men find these problems easy to manage.  You do not need treatment unless your symptoms bother you a lot or you have other problems, such as bladder infections or stones. In these cases, medicines may help. Surgery is not needed unless the urine flow is blocked or the symptoms do not get better with medicine.  Follow-up care is a key part of your treatment and safety. Be sure to make and go to all appointments, and call your doctor if you are having problems. It's also a good idea to know your test results and keep a list of the medicines you take.  How can you care for yourself at home?   ?? Take plenty of time to urinate. Try to relax.  ?? Try "double voiding." Urinate as much you can, relax for a few moments, and then try to urinate again.  ?? Sit on the toilet to urinate.  ?? Read or think of other things while you are waiting.  ?? Turn on a faucet, or try to picture running water. Some men find that this helps get their urine flowing.  ?? If dribbling is a problem, wash your penis daily to avoid skin irritation and infection.  ?? Avoid caffeine and alcohol. These drinks will increase how often you need to urinate. Spread your fluid intake throughout the day. If the urge to urinate often wakes you at night, limit your fluid intake in the evening. Urinate right before you go to bed.  ?? Many over-the-counter cold and allergy medicines can make the symptoms of BPH worse. Avoid antihistamines, decongestants, and allergy pills, if you can. Read the warnings on the package.  ?? If you take any prescription medicines, especially tranquilizers or antidepressants, ask your doctor or pharmacist whether they can cause urination problems. There may be other medicines you can use that do not cause urinary problems.  ?? Be safe with medicines. Take your medicines exactly as prescribed. Call your doctor if you think you are having a problem with your medicine.  When should you   call for help?  Call your doctor now or seek immediate medical care if:  ? ?? You cannot urinate at all.   ? ?? You have symptoms of a urinary infection. For example:  ?? You have blood or pus in your urine.  ?? You have pain in your back just below your rib cage. This is called flank pain.  ?? You have a fever, chills, or body aches.  ?? It hurts to urinate.  ?? You have groin or belly pain.   ?Watch closely for changes in your health, and be sure to contact your doctor if:  ? ?? It hurts when you ejaculate.   ? ?? Your urinary problems get a lot worse or bother you a lot.   Where can you learn more?  Go to http://www.healthwise.net/GoodHelpConnections.   Enter C033 in the search box to learn more about "Benign Prostatic Hyperplasia: Care Instructions."  Current as of: September 08, 2015  Content Version: 11.4  ?? 2006-2017 [employer]wise, Inc[employer]. Care instructions adapted under license by [employer] (which disclaims liability or warranty for this information). If you have questions about a medical condition or this instruction, always ask your healthcare professional. [employer]wise, [employer] disclaims any warranty or liability for your use of this information.

## 2016-12-16 ENCOUNTER — Telehealth

## 2016-12-16 NOTE — Telephone Encounter (Signed)
Order entered.

## 2016-12-16 NOTE — Telephone Encounter (Signed)
Referral center called and needs a order for a mri lumbar with contrast. Dr [name] placed order without and they won't make appt till this gets done. thanks

## 2016-12-29 ENCOUNTER — Encounter: Attending: Family Medicine | Primary: Family Medicine

## 2016-12-30 ENCOUNTER — Encounter: Attending: Urology | Primary: Family Medicine

## 2017-01-02 ENCOUNTER — Encounter: Attending: Urology | Primary: Family Medicine

## 2017-01-02 ENCOUNTER — Ambulatory Visit: Admit: 2017-01-02 | Discharge: 2017-01-02 | Payer: MEDICARE | Attending: Urology | Primary: Family Medicine

## 2017-01-02 ENCOUNTER — Inpatient Hospital Stay: Admit: 2017-01-02 | Payer: MEDICARE | Primary: Family Medicine

## 2017-01-02 ENCOUNTER — Encounter

## 2017-01-02 DIAGNOSIS — N138 Other obstructive and reflux uropathy: Secondary | ICD-10-CM

## 2017-01-02 DIAGNOSIS — Z125 Encounter for screening for malignant neoplasm of prostate: Secondary | ICD-10-CM

## 2017-01-02 LAB — PSA SCREENING (SCREENING): Prostate Specific Ag: 1.5 ng/mL (ref 0.0–4.0)

## 2017-01-02 MED ORDER — CEPHALEXIN 500 MG CAP
500 mg | ORAL_CAPSULE | Freq: Two times a day (BID) | ORAL | 0 refills | Status: DC
Start: 2017-01-02 — End: 2017-02-07

## 2017-01-02 NOTE — Patient Instructions (Signed)
[facility]     Office working hours are 8:00am-4:30pm    Office phone number is [phone] and the fax line is [phone]  For non-emergent medical care and clinical advice during office hours  1. Call the office or  2. Send a message to office staff via mychart  Emergency care can be obtained at the [facility] ER, Urgent Care or 911.    Patient Satisfaction Survey   We appreciate you sharing your e-mail address with us. Please watch for our patient  satisfaction survey which you will receive by e-mail. We strive to provide you with the   best care possible. We respect all comments and will take them into consideration to improve our service.  Thank you for your participation.

## 2017-01-02 NOTE — Progress Notes (Signed)
Patient here for urolift evaluation today.    Visit Vitals   ??? BP 140/81 (BP 1 Location: Left arm, BP Patient Position: Sitting)   ??? Pulse 82   ??? Temp 98.2 ??F (36.8 ??C) (Temporal)   ??? Resp 18   ??? Wt 196 lb (88.9 kg)   ??? SpO2 98%   ??? BMI 28.12 kg/m2     AUA/QOL: 21/5    Uroflometry  Voided Volume 267 ml   Maximum Flow Rate 20.2 ml/sec   Average Flow Rate 7.6 ml/sec   Voiding Time 38.7 mm:ss:S   Flow Pattern continuous   Post Void Residual 89 ml   Comments:       Prostate US:  Prostate dimension:  Height 31.1 mm  Length 50.6 mm  Width 57.5 mm  Prostate volume: 47.4 cm cubed  PSA Density: 0.05    Lab Results   Component Value Date/Time    Prostate Specific Ag 2.4 03/16/2016 10:15 AM    Prostate Specific Ag 2.9 12/09/2014 08:40 AM    Prostate Specific Ag 2.2 09/23/2013 09:35 AM       Results for orders placed or performed in visit on 12/12/16   AMB POC URINALYSIS DIP STICK AUTO W/O MICRO     Status: None   Result Value Ref Range Status    Color (UA POC) Yellow  Final    Clarity (UA POC) Clear  Final    Glucose (UA POC) Negative Negative Final    Bilirubin (UA POC) 1+ Negative Final    Ketones (UA POC) Negative Negative Final    Specific gravity (UA POC) 1.020 1.001 - 1.035 Final    Blood (UA POC) Negative Negative Final    pH (UA POC) 7.0 4.6 - 8.0 Final    Protein (UA POC) Negative Negative Final    Urobilinogen (UA POC) 0.2 mg/dL 0.2 - 1 Final    Nitrites (UA POC) Negative Negative Final    Leukocyte esterase (UA POC) Negative Negative Final             CYSTOSCOPY OPERATIVE NOTE:    Surgeon: Isac CaddyBrian P Terrell Shimko, DO  Patient name: Julian Bailey  DOB: 1945/09/09  Operative date: 01/02/17  Procedure:  Flexible Cystoscopy  Anesthesia: Local with urojet lidocaine jelly  Indications: BPH with LUTS    Findings:   Bilateral ureteral orfices with clear efflux: yes   Trabeculations: yes Mild trabeculation   Diverticulum: no   BPH: yes   Squamous metaplasia: no   Bladder tumor: no   Urethral tumor: no     Others: some sedimentation noted in the bladder    Assessment:  1. BPH with LUTS  2. Bladder trabeculations    Plan:  1. Keflex 500 mg BID for 2 doses  2. I will see back for surgery paperwork  3. Check PSA today

## 2017-01-03 ENCOUNTER — Ambulatory Visit: Admit: 2017-01-03 | Discharge: 2017-01-03 | Payer: MEDICARE | Attending: Family Medicine | Primary: Family Medicine

## 2017-01-03 DIAGNOSIS — M539 Dorsopathy, unspecified: Secondary | ICD-10-CM | POA: Insufficient documentation

## 2017-01-03 DIAGNOSIS — D6859 Other primary thrombophilia: Secondary | ICD-10-CM

## 2017-01-03 NOTE — Progress Notes (Signed)
HISTORY OF PRESENT ILLNESS  Julian Bailey is a 71 y.o. male.  Hypertension    The history is provided by the patient. This is a chronic problem. The problem has been gradually improving. Pertinent negatives include no chest pain, no palpitations, no malaise/fatigue, no blurred vision, no peripheral edema, no dizziness and no shortness of breath. Agents associated with hypertension include NSAIDs. Risk factors include male gender, hypertension and a sedentary lifestyle.   Back Pain    The history is provided by the patient. This is a recurrent problem. The current episode started more than 1 week ago (I reviewed his last MRI done at [facility] : spinal canal stenosis , deg disc dx L4-L5 and L5-S1 with radiculopathy RLE). The problem has been gradually worsening (He underwent a two level discectomy by Dr Norman Herrlich at [facility] , he did fine until the sciatica returned in January). The problem occurs constantly. Patient reports not work related injury.The pain is associated with no known injury. The pain is present in the lumbar spine, lower back, left side and right side. The quality of the pain is described as shooting and stabbing. The pain radiates to the right thigh, right foot and right knee. The pain is at a severity of 6/10. The pain is moderate. The pain is the same all the time. Associated symptoms include weight loss (35 lbs , intentional), leg pain, paresthesias, tingling and weakness. Pertinent negatives include no chest pain, no fever, no abdominal pain, no bowel incontinence, no perianal numbness, no bladder incontinence and no dysuria. He has tried NSAIDs for the symptoms. The treatment provided no relief. Risk factors: previous surgery. The patient's surgical history includes laminectomy.      Review of Systems   Constitutional: Positive for weight loss (35 lbs , intentional). Negative for chills, fever and malaise/fatigue.   Eyes: Negative for blurred vision.    Respiratory: Negative for cough, sputum production and shortness of breath.    Cardiovascular: Negative for chest pain, palpitations and leg swelling.   Gastrointestinal: Negative for abdominal pain, blood in stool, bowel incontinence, constipation, diarrhea and heartburn.   Genitourinary: Negative for bladder incontinence and dysuria.        He sees a urologist , BPH   Musculoskeletal: Positive for back pain and joint pain (s/p TKR x 2 with p;lanned THR R). Negative for myalgias.   Skin: Positive for rash (auto immmune lichen simplex chronica). Negative for itching.   Neurological: Positive for tingling, sensory change, focal weakness, weakness and paresthesias. Negative for dizziness.   Endo/Heme/Allergies: Negative for polydipsia. Does not bruise/bleed easily.   Psychiatric/Behavioral: Negative for depression and memory loss. The patient does not have insomnia.        Physical Exam   Constitutional: He is oriented to person, place, and time. He appears well-developed and well-nourished. No distress.   HENT:   Head: Normocephalic.   Right Ear: External ear normal.   Left Ear: External ear normal.   Eyes: EOM are normal. Pupils are equal, round, and reactive to light. Right eye exhibits no discharge. Left eye exhibits no discharge. No scleral icterus.   Neck: Normal range of motion. No tracheal deviation present. No thyromegaly present.   Cardiovascular: Normal rate, regular rhythm and normal heart sounds.  Exam reveals no gallop and no friction rub.    No murmur heard.  Pulmonary/Chest: Breath sounds normal. No stridor. No respiratory distress. He has no decreased breath sounds. He has no wheezes. He has no rhonchi. He has no  rales.   Abdominal: Soft. Bowel sounds are normal.   Musculoskeletal: He exhibits no edema.        Lumbar back: He exhibits decreased range of motion, bony tenderness and pain.        Back:    Neurological: He is alert and oriented to person, place, and time. A  sensory deficit is present. No cranial nerve deficit. Coordination and gait normal.   Reflex Scores:       Patellar reflexes are 2+ on the right side and 2+ on the left side.       Achilles reflexes are 0 on the right side and 0 on the left side.  SLR is neg x 2 at 90 degrees. Decreased sensation on the R   Skin: Skin is warm and dry. Rash noted. Rash is maculopapular. He is not diaphoretic. No erythema. No pallor.        Psychiatric: He has a normal mood and affect. His speech is normal and behavior is normal. Judgment and thought content normal. Cognition and memory are normal.       ASSESSMENT and PLAN    ICD-10-CM ICD-9-CM    1. 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 Essential hypertension I10 401.9    4. Benign non-nodular prostatic hyperplasia with lower urinary tract symptoms N40.1 600.91    5. Anticoagulated on Coumadin Z51.81 V58.83     Z79.01 V58.61    6. 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 Sciatica associated with disorder of lumbosacral spine M53.9 724.3 MRI LUMB SPINE WO CONT      REFERRAL TO NEUROSURGERY     Diagnoses and all orders for this visit:    1. 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 Essential hypertension    4. Benign non-nodular prostatic hyperplasia with lower urinary tract symptoms    5. Anticoagulated on Coumadin    6. Central stenosis of spinal canal  -     MRI LUMB SPINE WO CONT; Future  -     REFERRAL TO NEUROSURGERY    7. 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 Multilevel degenerative disc disease   -     MRI LUMB SPINE WO CONT; Future  -     REFERRAL TO NEUROSURGERY    9. Sciatica associated with disorder of lumbosacral spine  -     MRI LUMB SPINE WO CONT; Future  -     REFERRAL TO NEUROSURGERY      Follow-up Disposition:  Return in about 3 months (around 04/05/2017), or if symptoms worsen or fail to improve, for Follow Up.  the following changes in treatment are made: await the neurosurgery consult and the MRI . Consider EMG / NCV studies

## 2017-01-03 NOTE — Progress Notes (Signed)
Vision: Reading glasses  Hearing: normal  Cognitive: None  Transportation: I am completely independent and drive myself.  ADL's: Self-care    Ambulation Needs: None  Living Arrangements: With spouse        HEALTH LITERACY:  The patient has average health literacy with ability to obtain information and services, process the meaning of the information, understand choices available and consequences to choices, respond and act on the information as desired. The patient does appear to understand the impact of health on overall lifestyle and wellbeing.

## 2017-01-03 NOTE — Patient Instructions (Signed)
Eating Healthy Foods: Care Instructions  Your Care Instructions    Eating healthy foods can help lower your risk for disease. Healthy food gives you energy and keeps your heart strong, your brain active, your muscles working, and your bones strong.  A healthy diet includes a variety of foods from the basic food groups: grains, vegetables, fruits, milk and milk products, and meat and beans. Some people may eat more of their favorite foods from only one food group and, as a result, miss getting the nutrients they need. So, it is important to pay attention not only to what you eat but also to what you are missing from your diet. You can eat a healthy, balanced diet by making a few small changes.  Follow-up care is a key part of your treatment and safety. Be sure to make and go to all appointments, and call your doctor if you are having problems. It's also a good idea to know your test results and keep a list of the medicines you take.  How can you care for yourself at home?  Look at what you eat  ?? Keep a food diary for a week or two and record everything you eat or drink. Track the number of servings you eat from each food group.  ?? For a balanced diet every day, eat a variety of:  ?? 6 or more ounce-equivalents of grains, such as cereals, breads, crackers, rice, or pasta, every day. An ounce-equivalent is 1 slice of bread, 1 cup of ready-to-eat cereal, or ?? cup of cooked rice, cooked pasta, or cooked cereal.  ?? 2?? cups of vegetables, especially:  ?? Dark-green vegetables such as broccoli and spinach.  ?? Orange vegetables such as carrots and sweet potatoes.  ?? Dry beans (such as pinto and kidney beans) and peas (such as lentils).  ?? 2 cups of fresh, frozen, or canned fruit. A small apple or 1 banana or orange equals 1 cup.  ?? 3 cups of nonfat or low-fat milk, yogurt, or other milk products.  ?? 5?? ounces of meat and beans, such as chicken, fish, lean meat, beans,  nuts, and seeds. One egg, 1 tablespoon of peanut butter, ?? ounce nuts or seeds, or ?? cup of cooked beans equals 1 ounce of meat.  ?? Learn how to read food labels for serving sizes and ingredients. Fast-food and convenience-food meals often contain few or no fruits or vegetables. Make sure you eat some fruits and vegetables to make the meal more nutritious.  ?? Look at your food diary. For each food group, add up what you have eaten and then divide the total by the number of days. This will give you an idea of how much you are eating from each food group. See if you can find some ways to change your diet to make it more healthy.  Start small  ?? Do not try to make dramatic changes to your diet all at once. You might feel that you are missing out on your favorite foods and then be more likely to fail.  ?? Start slowly, and gradually change your habits. Try some of the following:  ?? Use whole wheat bread instead of white bread.  ?? Use nonfat or low-fat milk instead of whole milk.  ?? Eat brown rice instead of white rice, and eat whole wheat pasta instead of white-flour pasta.  ?? Try low-fat cheeses and low-fat yogurt.  ?? Add more fruits and vegetables to meals and have them for   snacks.  ?? Add lettuce, tomato, cucumber, and onion to sandwiches.  ?? Add fruit to yogurt and cereal.  Enjoy food  ?? You can still eat your favorite foods. You just may need to eat less of them. If your favorite foods are high in fat, salt, and sugar, limit how often you eat them, but do not cut them out entirely.  ?? Eat a wide variety of foods.  Make healthy choices when eating out  ?? The type of restaurant you choose can help you make healthy choices. Even fast-food chains are now offering more low-fat or healthier choices on the menu.  ?? Choose smaller portions, or take half of your meal home.  ?? When eating out, try:  ?? A veggie pizza with a whole wheat crust or grilled chicken (instead of sausage or pepperoni).   ?? Pasta with roasted vegetables, grilled chicken, or marinara sauce instead of cream sauce.  ?? A vegetable wrap or grilled chicken wrap.  ?? Broiled or poached food instead of fried or breaded items.  Make healthy choices easy  ?? Buy packaged, prewashed, ready-to-eat fresh vegetables and fruits, such as baby carrots, salad mixes, and chopped or shredded broccoli and cauliflower.  ?? Buy packaged, presliced fruits, such as melon or pineapple.  ?? Choose 100% fruit or vegetable juice instead of soda. Limit juice intake to 4 to 6 oz (?? to ?? cup) a day.  ?? Blend low-fat yogurt, fruit juice, and canned or frozen fruit to make a smoothie for breakfast or a snack.  Where can you learn more?  Go to http://www.healthwise.net/[facility].  Enter T756 in the search box to learn more about "Eating Healthy Foods: Care Instructions."  Current as of: Nov 06, 2015  Content Version: 11.4  ?? 2006-2017 [employer]. Care instructions adapted under license by [facility] (which disclaims liability or warranty for this information). If you have questions about a medical condition or this instruction, always ask your healthcare professional. [employer] disclaims any warranty or liability for your use of this information.       Learning About Healthy Weight  What is a healthy weight?    A healthy weight is the weight at which you feel good about yourself and have energy for work and play. It's also one that lowers your risk for health problems.  What can you do to stay at a healthy weight?  It can be hard to stay at a healthy weight, especially when fast food, vending-machine snacks, and processed foods are so easy to find. And with your busy lifestyle, activity may be low on your list of things to do. But staying at a healthy weight may be easier than you think.  Here are some dos and don'ts for staying at a healthy weight:  Do eat healthy foods   The kinds of foods you eat have a big impact on both your weight and your health. Reaching and staying at a healthy weight is not about going on a diet. It's about making healthier food choices every day and changing your diet for good.  Healthy eating means eating a variety of foods so that you get all the nutrients you need. Your body needs protein, carbohydrate, and fats for energy. They keep your heart beating, your brain active, and your muscles working.  On most days, try to eat from each food group. This means eating a variety of:  ?? Whole grains, such as whole wheat breads and pastas.  ??   Fruits and vegetables.  ?? Dairy products, such as low-fat milk, yogurt, and cheese.  ?? Lean proteins, such as all types of fish, chicken without the skin, and beans.  Don't have too much or too little of one thing. All foods, if eaten in moderation, can be part of healthy eating. Even sweets can be okay.  If your favorite foods are high in fat, salt, sugar, or calories, limit how often you eat them. Eat smaller servings, or look for healthy substitutes.  Do watch what you eat  Many people eat more than their bodies need. Part of staying at a healthy weight means learning how much food you really need from day to day and not eating more than that. Even with healthy foods, eating too much can make you gain weight.  Having a well-balanced diet means that you eat enough, but not too much, and that your food gives you the nutrients you need to stay healthy. So listen to your body. Eat when you're hungry. Stop when you feel satisfied.  It's a good idea to have healthy snacks ready for when you get hungry. Keep healthy snacks with you at work, in your car, and at home. If you have a healthy snack easily available, you'll be less likely to pick a candy bar or bag of chips from a vending machine instead.  Some healthy snacks you might want to keep on hand are fruit, low-fat  yogurt, string cheese, low-fat microwave popcorn, raisins and other dried fruit, nuts, whole wheat crackers, pretzels, carrots, celery sticks, and broccoli.  Do some physical activity  A big part of reaching and staying at a healthy weight is being active.  When you're active, you burn calories. This makes it easier to reach and stay at a healthy weight. When you're active on a regular basis, your body burns more calories, even when you're at rest. Being active helps you lose fat and build lean muscle.  Try to be active for at least 1 hour every day. This may sound like a lot, but it's okay to be active in smaller blocks of time that add up to 1 hour a day. Any activity that makes your heart beat faster and keeps it there for a while counts. A brisk walk, run, or swim will get your heart beating faster. So will climbing stairs, shooting baskets, or cycling. Even some household chores like vacuuming and mowing the lawn will get your heart rate up.  Pick activities that you enjoy-ones that make your heart beat faster, your muscles stronger, and your muscles and joints more flexible. If you find more than one thing you like doing, do them all. You don't have to do the same thing every day.  Don't diet  Diets don't work.  Diets are temporary. Because you give up so much when you diet, you may be hungry and think about food all the time. And after you stop dieting, you also may overeat to make up for what you missed. Most people who diet end up gaining back the pounds they lost-and more.  Remember that healthy bodies come in lots of shapes and sizes. Everyone can get healthier by eating better and being more active.  Where can you learn more?  Go to http://www.healthwise.net/[facility].  Enter B909 in the search box to learn more about "Learning About Healthy Weight."  Current as of: April 09, 2015  Content Version: 11.4  ?? 2006-2017 [employer]. Care instructions adapted under  license by [facility] (which disclaims liability or warranty for this information). If you have questions about a medical condition or this instruction, always ask your healthcare professional. [employer] disclaims any warranty or liability for your use of this information.       DASH Diet: Care Instructions  Your Care Instructions    The DASH diet is an eating plan that can help lower your blood pressure. DASH stands for Dietary Approaches to Stop Hypertension. Hypertension is high blood pressure.  The DASH diet focuses on eating foods that are high in calcium, potassium, and magnesium. These nutrients can lower blood pressure. The foods that are highest in these nutrients are fruits, vegetables, low-fat dairy products, nuts, seeds, and legumes. But taking calcium, potassium, and magnesium supplements instead of eating foods that are high in those nutrients does not have the same effect. The DASH diet also includes whole grains, fish, and poultry.  The DASH diet is one of several lifestyle changes your doctor may recommend to lower your high blood pressure. Your doctor may also want you to decrease the amount of sodium in your diet. Lowering sodium while following the DASH diet can lower blood pressure even further than just the DASH diet alone.  Follow-up care is a key part of your treatment and safety. Be sure to make and go to all appointments, and call your doctor if you are having problems. It's also a good idea to know your test results and keep a list of the medicines you take.  How can you care for yourself at home?  Following the DASH diet  ?? Eat 4 to 5 servings of fruit each day. A serving is 1 medium-sized piece of fruit, ?? cup chopped or canned fruit, 1/4 cup dried fruit, or 4 ounces (?? cup) of fruit juice. Choose fruit more often than fruit juice.  ?? Eat 4 to 5 servings of vegetables each day. A serving is 1 cup of  lettuce or raw leafy vegetables, ?? cup of chopped or cooked vegetables, or 4 ounces (?? cup) of vegetable juice. Choose vegetables more often than vegetable juice.  ?? Get 2 to 3 servings of low-fat and fat-free dairy each day. A serving is 8 ounces of milk, 1 cup of yogurt, or 1 ?? ounces of cheese.  ?? Eat 6 to 8 servings of grains each day. A serving is 1 slice of bread, 1 ounce of dry cereal, or ?? cup of cooked rice, pasta, or cooked cereal. Try to choose whole-grain products as much as possible.  ?? Limit lean meat, poultry, and fish to 2 servings each day. A serving is 3 ounces, about the size of a deck of cards.  ?? Eat 4 to 5 servings of nuts, seeds, and legumes (cooked dried beans, lentils, and split peas) each week. A serving is 1/3 cup of nuts, 2 tablespoons of seeds, or ?? cup of cooked beans or peas.  ?? Limit fats and oils to 2 to 3 servings each day. A serving is 1 teaspoon of vegetable oil or 2 tablespoons of salad dressing.  ?? Limit sweets and added sugars to 5 servings or less a week. A serving is 1 tablespoon jelly or jam, ?? cup sorbet, or 1 cup of lemonade.  ?? Eat less than 2,300 milligrams (mg) of sodium a day. If you limit your sodium to 1,500 mg a day, you can lower your blood pressure even more.  Tips for success  ?? Start small. Do not try to   make dramatic changes to your diet all at once. You might feel that you are missing out on your favorite foods and then be more likely to not follow the plan. Make small changes, and stick with them. Once those changes become habit, add a few more changes.  ?? Try some of the following:  ?? Make it a goal to eat a fruit or vegetable at every meal and at snacks. This will make it easy to get the recommended amount of fruits and vegetables each day.  ?? Try yogurt topped with fruit and nuts for a snack or healthy dessert.  ?? Add lettuce, tomato, cucumber, and onion to sandwiches.  ?? Combine a ready-made pizza crust with low-fat mozzarella cheese and lots  of vegetable toppings. Try using tomatoes, squash, spinach, broccoli, carrots, cauliflower, and onions.  ?? Have a variety of cut-up vegetables with a low-fat dip as an appetizer instead of chips and dip.  ?? Sprinkle sunflower seeds or chopped almonds over salads. Or try adding chopped walnuts or almonds to cooked vegetables.  ?? Try some vegetarian meals using beans and peas. Add garbanzo or kidney beans to salads. Make burritos and tacos with mashed pinto beans or black beans.  Where can you learn more?  Go to http://www.healthwise.net/[facility].  Enter H967 in the search box to learn more about "DASH Diet: Care Instructions."  Current as of: March 18, 2015  Content Version: 11.4  ?? 2006-2017 [employer]. Care instructions adapted under license by [facility] (which disclaims liability or warranty for this information). If you have questions about a medical condition or this instruction, always ask your healthcare professional. [employer] disclaims any warranty or liability for your use of this information.

## 2017-01-04 ENCOUNTER — Ambulatory Visit: Payer: MEDICARE | Primary: Family Medicine

## 2017-01-11 ENCOUNTER — Inpatient Hospital Stay: Admit: 2017-01-11 | Payer: MEDICARE | Attending: Family Medicine | Primary: Family Medicine

## 2017-01-11 ENCOUNTER — Encounter: Attending: Urology | Primary: Family Medicine

## 2017-01-11 DIAGNOSIS — M4800 Spinal stenosis, site unspecified: Secondary | ICD-10-CM

## 2017-01-11 NOTE — Progress Notes (Signed)
Patient notified, he was to call the referral service once MRI was done.  I spoke with him and he states he did not call, so I gave him number of [facility] for [facility] to let her know his MRI is done and to resume getting appointment arranged.

## 2017-01-16 NOTE — Telephone Encounter (Signed)
Per referral services patient needs to have a MRI with Contrast in order to be able to have appt with Neurologist. MRI is scheduled for 01/18/2017 at 330. Patient notified.

## 2017-01-18 ENCOUNTER — Inpatient Hospital Stay: Admit: 2017-01-18 | Payer: MEDICARE | Attending: Nurse Practitioner | Primary: Family Medicine

## 2017-01-18 DIAGNOSIS — M545 Low back pain: Secondary | ICD-10-CM

## 2017-01-18 MED ORDER — GADOTERATE MEGLUMINE 0.5 MMOL/ML IV SOLUTION
0.5 mmol/mL (376.9 mg/mL) | Freq: Once | INTRAVENOUS | Status: AC
Start: 2017-01-18 — End: 2017-01-18
  Administered 2017-01-18: 20:00:00 via INTRAVENOUS

## 2017-01-18 MED FILL — DOTAREM 0.5 MMOL/ML (376.9 MG/ML) INTRAVENOUS SOLUTION: 0.5 mmol/mL (376.9 mg/mL) | INTRAVENOUS | Qty: 20

## 2017-01-20 NOTE — Progress Notes (Signed)
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

## 2017-01-20 NOTE — Progress Notes (Signed)
Patient notified, verbalized understanding.

## 2017-01-27 ENCOUNTER — Encounter

## 2017-01-30 ENCOUNTER — Inpatient Hospital Stay: Admit: 2017-01-30 | Payer: MEDICARE | Attending: Orthopaedic Surgery | Primary: Family Medicine

## 2017-01-30 DIAGNOSIS — M25511 Pain in right shoulder: Secondary | ICD-10-CM

## 2017-02-01 ENCOUNTER — Ambulatory Visit: Admit: 2017-02-01 | Discharge: 2017-02-01 | Payer: MEDICARE | Attending: Urology | Primary: Family Medicine

## 2017-02-01 DIAGNOSIS — N401 Enlarged prostate with lower urinary tract symptoms: Secondary | ICD-10-CM

## 2017-02-01 DIAGNOSIS — N138 Other obstructive and reflux uropathy: Secondary | ICD-10-CM

## 2017-02-01 LAB — AMB POC URINALYSIS DIP STICK AUTO W/O MICRO
Bilirubin (UA POC): NEGATIVE
Glucose (UA POC): NEGATIVE
Ketones (UA POC): NEGATIVE
Leukocyte esterase (UA POC): NEGATIVE
Nitrites (UA POC): NEGATIVE
Protein (UA POC): NEGATIVE
Specific gravity (UA POC): 1.02 (ref 1.001–1.035)
Urobilinogen (UA POC): 0.2 (ref 0.2–1)
pH (UA POC): 7 (ref 4.6–8.0)

## 2017-02-01 NOTE — Progress Notes (Signed)
Male Progress Note      Patient: Julian Bailey               Sex: male             MRN: [id number]      Date of Birth:  03/03/1946      Age:  71 y.o.               Subjective:     Julian Bailey is a 71 y.o. male who is being seen for follow-up for BPH with LUTS, incomplete bladder emptying, ED.  The patient feels that his urinary symptoms have been poor lately.  He had back surgery on 10/11 and a knee replacement 10/23.  He has been on and off pain medication, which he feels may have negatively affected his urinary symptoms.  His stream has been weak at times, but it seems to be improving since stopping the pain medication an by the addition of proscar.  He does feel that his bladder empties now, but it didn't before.  He has nocturia about 2 times nightly, but he admits that he wakes up for other reasons.  He takes tamsulosin 0.4 mg daily and proscar, but he doesn't need refills.  No dysuria or gross hematuria.  He is having some frequency and urgency, which has also improved.  He had urolift evaluation and he does wish for surgery.  Risks were discussed in detail an he wishes to move forward.    No ED with Levitra.    The patient is getting TestoPel with his IM physician and he feels they are working well for him.    We also discussed the patient's BMI today.  Body mass index is 28.63 kg/(m^2).  The patient is asked to make an attempt to improve diet and exercise patterns to aid in medical management of this problem.       He is current on his colonoscopy.    15 minutes were spent in face-to-face time with the patient at his visit today, >50% of which were spent in counseling in treatment options and outcomes, health maintenance, dietary modifications, and answering all questions.     Past Medical History:   Diagnosis Date   ??? Arthritis    ??? BPH (benign prostatic hyperplasia)    ??? Cervical disc disease    ??? DVT (deep venous thrombosis) (HCC) x 2 2009  and 2011      after knee surgery then after long car ride    ??? ED (erectile dysfunction)    ??? Hypercoagulopathy (HCC)    ??? Hypertension    ??? 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 Sleep apnea     CPAP   ??? Thromboembolus (HCC)        Past Surgical History:   Procedure Laterality Date   ??? HX BACK SURGERY  03/2016    Lumbar "Drill holes and cleaned it out".   ??? HX CATARACT REMOVAL Bilateral    ??? HX ENDOSCOPY      colonoscopy last was in 2008    ??? 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 HX KNEE REPLACEMENT Right        Family History   Problem Relation Age of Onset   ??? Arthritis-osteo Mother    ??? Hypertension Mother    ??? Arthritis-osteo Father    ??? Arthritis-osteo Brother    ??? Cancer Paternal Uncle      unknown   ???   Cancer Paternal Uncle      stomach   ??? Diabetes Paternal Uncle        Social History     Social History   ??? Marital status: MARRIED     Spouse name: N/A   ??? Number of children: N/A   ??? Years of education: N/A     Social History Main Topics   ??? Smoking status: Former Smoker     Packs/day: 0.50     Years: 20.00     Types: Cigarettes     Quit date: 11/30/1984   ??? Smokeless tobacco: Never Used   ??? Alcohol use No   ??? Drug use: No   ??? Sexual activity: Yes     Partners: Female     Other Topics Concern   ??? Blood Transfusions No     Social History Narrative    Medicare wellness 09/23/13        Prior to Admission medications    Medication Sig Start Date End Date Taking? Authorizing Provider   diclofenac EC (VOLTAREN) 75 mg EC tablet  12/26/16  Yes Historical Provider   tamsulosin (FLOMAX) 0.4 mg capsule Take 1 Cap by mouth daily. 12/01/16  Yes Elmo Rio P Alison Breeding, DO   warfarin (COUMADIN) 1 mg tablet As directed 10/04/16  Yes Lauren E Miller, DO   finasteride (PROSCAR) 5 mg tablet Take 1 Tab by mouth daily. 06/13/16  Yes Violet Seabury P Takia Runyon, DO   hydrocortisone valerate (WESTCORT) 0.2 % topical cream Apply  to affected area two (2) times a day. use thin layer   Yes Historical Provider    testosterone 75 mg pllt by Implant route.   Yes Historical Provider   warfarin (COUMADIN) 5 mg tablet TAKE AS DIRECTED  Patient taking differently: TAKE AS DIRECTED. taking 6mg daily. 11/26/15  Yes Lauren E Miller, DO   multivitamin (ONE A DAY) tablet Take 0.5 Tabs by mouth two (2) times a day.   Yes Historical Provider   cephALEXin (KEFLEX) 500 mg capsule Take 1 Cap by mouth two (2) times a day. Indications: PREVENTION OF BACTERIAL URINARY TRACT INFECTION 01/02/17   Jerron Niblack P Devarious Pavek, DO       No Known Allergies    Immunization History   Administered Date(s) Administered   ??? Influenza Vaccine 03/26/2014   ??? Influenza Vaccine (Quad) 03/16/2016   ??? Tdap 05/13/2013         Review of Systems:  General:  Intentional weight loss  Head:   negative  Eyes:   negative  Respiratory:  Patient denies cough, chest pain, dyspnea, wheezing or hemoptysis  Cardiac:  negative  Gatrointestinal: negative  Urinary:  urinary frequency/urgency and BPH  Musculoskeletal: joint pain  Endocrine:  hypogonadism  Hematological: there is no easy bleeding or bruising  Psychological: negative  Skin:   Denies: rash, itching, hives      Objective:      Visit Vitals   ??? BP 130/79   ??? Pulse 74   ??? Temp 98.2 ??F (36.8 ??C) (Temporal)   ??? Ht 5' 10" (1.778 m)   ??? Wt 199 lb 8 oz (90.5 kg)   ??? BMI 28.63 kg/m2         Labs:  Urinalysis:    Results for orders placed or performed in visit on 02/01/17   AMB POC URINALYSIS DIP STICK AUTO W/O MICRO     Status: None   Result Value Ref Range Status    Color (UA POC) Yellow  Final      Clarity (UA POC) Clear  Final    Glucose (UA POC) Negative Negative Final    Bilirubin (UA POC) Negative Negative Final    Ketones (UA POC) Negative Negative Final    Specific gravity (UA POC) 1.020 1.001 - 1.035 Final    Blood (UA POC) 3+ Negative Final    pH (UA POC) 7.0 4.6 - 8.0 Final    Protein (UA POC) Negative Negative Final    Urobilinogen (UA POC) 0.2 mg/dL 0.2 - 1 Final    Nitrites (UA POC) Negative Negative Final     Leukocyte esterase (UA POC) Negative Negative Final       Lab Results   Component Value Date/Time    Prostate Specific Ag 1.5 01/02/2017 08:15 AM    Prostate Specific Ag 2.4 03/16/2016 10:15 AM    Prostate Specific Ag 2.9 12/09/2014 08:40 AM        Visit Vitals   ??? BP 130/79   ??? Pulse 74   ??? Temp 98.2 ??F (36.8 ??C) (Temporal)   ??? Ht 5' 10" (1.778 m)   ??? Wt 199 lb 8 oz (90.5 kg)   ??? BMI 28.63 kg/m2     AUA/QOL: 21/5    Uroflometry  Voided Volume 267 ml   Maximum Flow Rate 20.2 ml/sec   Average Flow Rate 7.6 ml/sec   Voiding Time 38.7 mm:ss:S   Flow Pattern continuous   Post Void Residual 89 ml   Comments:       Prostate US:  Prostate dimension:  Height 31.1 mm  Length 50.6 mm  Width 57.5 mm  Prostate volume: 47.4 cm cubed  PSA Density: 0.05          CYSTOSCOPY OPERATIVE NOTE:    Surgeon: Annmarie Plemmons P Mael Delap, DO  Patient name: Murry Earl Vath  DOB: 05/15/1946  Operative date: 02/01/17  Procedure:  Flexible Cystoscopy  Anesthesia: Local with urojet lidocaine jelly  Indications: BPH with LUTS    Findings:   Bilateral ureteral orfices with clear efflux: yes   Trabeculations: yes Mild trabeculation   Diverticulum: no   BPH: yes   Squamous metaplasia: no   Bladder tumor: no   Urethral tumor: no    Others: some sedimentation noted in the bladder      Imaging:   None reviewed.      PVR:  Was 196 mL    SHIM Score: 24 was 23 (No ED)    AUA/QOL score:  12/3 was 16/3      Physical Exam:   Constitutional: Normal appearing patient with well groomed; No deformities; Body habitus appears normal; Nutritionally sound.   Neck: Supple; No JVD; No cervical lymphadenopathy; No carotid bruits; No masses; trachea midline; No thyromegaly  Respiratory: No use of accessory muscles; No intercostal retractions; Clear to auscultation bilaterally  Heart: Regular rate and rhythm; No murmurs, rubs, gallops  GI: soft, non tender, normal bowel sounds, no palpable masses, no organomegaly, overweight    GU:   Deferred    Digital Rectal Exam:   Deferred     Musculoskeletal:   No CVA tenderness; Good sensation, ROM, and strength bilaterally of the Upper and Lower extremities.    Assessment/Plan     1. BPH with LUTS - continue tamsulosin 0.4 mg daily. ??Continue finasteride 5 mg daily.  The risk of high grade Gleason 8-10 prostate cancer associated with the use of Avodart (dutasteride) and Proscar (finasteride) were discussed with the patient.   2. Incomplete bladder emptying - continue tamsulosin   0.4 mg daily. Possible side effects of this medication were discussed with the patient to include orthostasis and retrograde ejaculation. The patient was advised to take this medication at least 2 hours apart from the PDE5 inhibitors.  Add finasteride 5 mg daily.  3. MH--urine culture    .

## 2017-02-01 NOTE — H&P (View-Only) (Signed)
Male Progress Note      Patient: Julian Bailey               Sex: male             MRN: [id number]      Date of Birth:  1946-05-03      Age:  71 y.o.               Subjective:     Olaf Mesa is a 71 y.o. male who is being seen for follow-up for BPH with LUTS, incomplete bladder emptying, ED.  The patient feels that his urinary symptoms have been poor lately.  He had back surgery on 10/11 and a knee replacement 10/23.  He has been on and off pain medication, which he feels may have negatively affected his urinary symptoms.  His stream has been weak at times, but it seems to be improving since stopping the pain medication an by the addition of proscar.  He does feel that his bladder empties now, but it didn't before.  He has nocturia about 2 times nightly, but he admits that he wakes up for other reasons.  He takes tamsulosin 0.4 mg daily and proscar, but he doesn't need refills.  No dysuria or gross hematuria.  He is having some frequency and urgency, which has also improved.  He had urolift evaluation and he does wish for surgery.  Risks were discussed in detail an he wishes to move forward.    No ED with Levitra.    The patient is getting TestoPel with his IM physician and he feels they are working well for him.    We also discussed the patient's BMI today.  Body mass index is 28.63 kg/(m^2).  The patient is asked to make an attempt to improve diet and exercise patterns to aid in medical management of this problem.       He is current on his colonoscopy.    15 minutes were spent in face-to-face time with the patient at his visit today, >50% of which were spent in counseling in treatment options and outcomes, health maintenance, dietary modifications, and answering all questions.     Past Medical History:   Diagnosis Date   ??? Arthritis    ??? BPH (benign prostatic hyperplasia)    ??? Cervical disc disease    ??? DVT (deep venous thrombosis) (HCC) x 2 2009  and 2011      after knee surgery then after long car ride    ??? ED (erectile dysfunction)    ??? Hypercoagulopathy (HCC)    ??? Hypertension    ??? 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 Sleep apnea     CPAP   ??? Thromboembolus [facility])        Past Surgical History:   Procedure Laterality Date   ??? HX BACK SURGERY  03/2016    Lumbar "Drill holes and cleaned it out".   ??? HX CATARACT REMOVAL Bilateral    ??? HX ENDOSCOPY      colonoscopy last was in 2008    ??? 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 HX KNEE REPLACEMENT Right        Family History   Problem Relation Age of Onset   ??? Arthritis-osteo Mother    ??? Hypertension Mother    ??? Arthritis-osteo Father    ??? Arthritis-osteo Brother    ??? Cancer Paternal Uncle      unknown   ???  Cancer Paternal Uncle      stomach   ??? Diabetes Paternal Uncle        Social History     Social History   ??? Marital status: MARRIED     Spouse name: N/A   ??? Number of children: N/A   ??? Years of education: N/A     Social History Main Topics   ??? Smoking status: Former Smoker     Packs/day: 0.50     Years: 20.00     Types: Cigarettes     Quit date: 11/30/1984   ??? Smokeless tobacco: Never Used   ??? Alcohol use No   ??? Drug use: No   ??? Sexual activity: Yes     Partners: Female     Other Topics Concern   ??? Blood Transfusions No     Social History Narrative    Medicare wellness 09/23/13        Prior to Admission medications    Medication Sig Start Date End Date Taking? Authorizing Provider   diclofenac EC (VOLTAREN) 75 mg EC tablet  12/26/16  Yes Historical Provider   tamsulosin (FLOMAX) 0.4 mg capsule Take 1 Cap by mouth daily. 12/01/16  Yes Jibri Schriefer P Keyaria Lawson, DO   warfarin (COUMADIN) 1 mg tablet As directed 10/04/16  Yes Lauren Samara DeistE Miller, DO   finasteride (PROSCAR) 5 mg tablet Take 1 Tab by mouth daily. 06/13/16  Yes Ashleah Valtierra P Alexsandro Salek, DO   hydrocortisone valerate (WESTCORT) 0.2 % topical cream Apply  to affected area two (2) times a day. use thin layer   Yes Historical Provider    testosterone 75 mg pllt by Implant route.   Yes Historical Provider   warfarin (COUMADIN) 5 mg tablet TAKE AS DIRECTED  Patient taking differently: TAKE AS DIRECTED. taking 6mg  daily. 11/26/15  Yes Lauren Samara DeistE Miller, DO   multivitamin (ONE A DAY) tablet Take 0.5 Tabs by mouth two (2) times a day.   Yes Historical Provider   cephALEXin (KEFLEX) 500 mg capsule Take 1 Cap by mouth two (2) times a day. Indications: PREVENTION OF BACTERIAL URINARY TRACT INFECTION 01/02/17   Art BuffBrian P Kaisyn Reinhold, DO       No Known Allergies    Immunization History   Administered Date(s) Administered   ??? Influenza Vaccine 03/26/2014   ??? Influenza Vaccine (Quad) 03/16/2016   ??? Tdap 05/13/2013         Review of Systems:  General:  Intentional weight loss  Head:   negative  Eyes:   negative  Respiratory:  Patient denies cough, chest pain, dyspnea, wheezing or hemoptysis  Cardiac:  negative  Gatrointestinal: negative  Urinary:  urinary frequency/urgency and BPH  Musculoskeletal: joint pain  Endocrine:  hypogonadism  Hematological: there is no easy bleeding or bruising  Psychological: negative  Skin:   Denies: rash, itching, hives      Objective:      Visit Vitals   ??? BP 130/79   ??? Pulse 74   ??? Temp 98.2 ??F (36.8 ??C) (Temporal)   ??? Ht 5\' 10"  (1.778 m)   ??? Wt 199 lb 8 oz (90.5 kg)   ??? BMI 28.63 kg/m2         Labs:  Urinalysis:    Results for orders placed or performed in visit on 02/01/17   AMB POC URINALYSIS DIP STICK AUTO W/O MICRO     Status: None   Result Value Ref Range Status    Color (UA POC) Yellow  Final  Clarity (UA POC) Clear  Final    Glucose (UA POC) Negative Negative Final    Bilirubin (UA POC) Negative Negative Final    Ketones (UA POC) Negative Negative Final    Specific gravity (UA POC) 1.020 1.001 - 1.035 Final    Blood (UA POC) 3+ Negative Final    pH (UA POC) 7.0 4.6 - 8.0 Final    Protein (UA POC) Negative Negative Final    Urobilinogen (UA POC) 0.2 mg/dL 0.2 - 1 Final    Nitrites (UA POC) Negative Negative Final     Leukocyte esterase (UA POC) Negative Negative Final       Lab Results   Component Value Date/Time    Prostate Specific Ag 1.5 01/02/2017 08:15 AM    Prostate Specific Ag 2.4 03/16/2016 10:15 AM    Prostate Specific Ag 2.9 12/09/2014 08:40 AM        Visit Vitals   ??? BP 130/79   ??? Pulse 74   ??? Temp 98.2 ??F (36.8 ??C) (Temporal)   ??? Ht 5\' 10"  (1.778 m)   ??? Wt 199 lb 8 oz (90.5 kg)   ??? BMI 28.63 kg/m2     AUA/QOL: 21/5    Uroflometry  Voided Volume 267 ml   Maximum Flow Rate 20.2 ml/sec   Average Flow Rate 7.6 ml/sec   Voiding Time 38.7 mm:ss:S   Flow Pattern continuous   Post Void Residual 89 ml   Comments:       Prostate [place]:  Prostate dimension:  Height 31.1 mm  Length 50.6 mm  Width 57.5 mm  Prostate volume: 47.4 cm cubed  PSA Density: 0.05          CYSTOSCOPY OPERATIVE NOTE:    Surgeon: Isac Caddy, DO  Patient name: Ysmael Hires Maffia  DOB: 24-Mar-1946  Operative date: 02/01/17  Procedure:  Flexible Cystoscopy  Anesthesia: Local with urojet lidocaine jelly  Indications: BPH with LUTS    Findings:   Bilateral ureteral orfices with clear efflux: yes   Trabeculations: yes Mild trabeculation   Diverticulum: no   BPH: yes   Squamous metaplasia: no   Bladder tumor: no   Urethral tumor: no    Others: some sedimentation noted in the bladder      Imaging:   None reviewed.      PVR:  Was 196 mL    SHIM Score: 24 was 23 (No ED)    AUA/QOL score:  12/3 was 16/3      Physical Exam:   Constitutional: Normal appearing patient with well groomed; No deformities; Body habitus appears normal; Nutritionally sound.   Neck: Supple; No JVD; No cervical lymphadenopathy; No carotid bruits; No masses; trachea midline; No thyromegaly  Respiratory: No use of accessory muscles; No intercostal retractions; Clear to auscultation bilaterally  Heart: Regular rate and rhythm; No murmurs, rubs, gallops  GI: soft, non tender, normal bowel sounds, no palpable masses, no organomegaly, overweight    GU:   Deferred    Digital Rectal Exam:   Deferred     Musculoskeletal:   No CVA tenderness; Good sensation, ROM, and strength bilaterally of the Upper and Lower extremities.    Assessment/Plan     1. BPH with LUTS - continue tamsulosin 0.4 mg daily. ??Continue finasteride 5 mg daily.  The risk of high grade Gleason 8-10 prostate cancer associated with the use of Avodart (dutasteride) and Proscar (finasteride) were discussed with the patient.   2. Incomplete bladder emptying - continue tamsulosin  0.4 mg daily. Possible side effects of this medication were discussed with the patient to include orthostasis and retrograde ejaculation. The patient was advised to take this medication at least 2 hours apart from the PDE5 inhibitors.  Add finasteride 5 mg daily.  3. MH--urine culture    .

## 2017-02-01 NOTE — Patient Instructions (Addendum)
[facility]     Office working hours are 8:00am-4:30pm    Office phone number is [phone] and the fax line is [phone]  For non-emergent medical care and clinical advice during office hours  1. Call the office or  2. Send a message to office staff via [facility]  Emergency care can be obtained at the [facility], Urgent Care or 911.    Patient Satisfaction Survey   We appreciate you sharing your e-mail address with us. Please watch for our patient  satisfaction survey which you will receive by e-mail. We strive to provide you with the   best care possible. We respect all comments and will take them into consideration to improve our service.  Thank you for your participation.      [facility]  Isac CaddyBrian P. DeFade, DO      Patient Name: Julian StainsGregory Earl [facility]    You are schedule for Urolift on Friday, 02/10/2017. Your procedure will be performed at the main hospital OR.  Please arrive at 07:30.  Below are instructions for this procedure. If you have any questions regarding these instruction, please contact our office.      Change in Health Status:  Notify our office if you experience any significant change in your health status or if you develop a cold, flu symptoms, urinary infection, diarrhea, etc. before your surgery.      Preoperative Medication Instructions:    7 days prior to surgery:  ?? Stop taking any blood thinning medications (coumadin, Warfarin, Plavix, etc) and aspirin productions, if you have your prescribing doctor's permission. We will need to discuss this issue with you, including instructions for restarting these medications.    2 days prior to surgery:  ?? Stop oral diabetes medications 48 hours prior to surgery (Glucophage, metformin, etc).  ?? Stop all nonsteriodal anti-inflammatory medications (Celebrex, relafen, Lodine, fenprofen, ibuprofen, Advil, Motrin, Nuprin, toradol, naproxen, Aleve, meclomen, Ponstel, Anaprox, naprosyn, etc).    Day of your surgery:   ?? Do not take digitalis medications (digoxin, Lanosin, crystodigin, etc).  ?? DO take half of your usual morning insulin dose unless you will be driving a great distance the morning of your surgery or if your surgery is scheduled for the afternoon.  ?? You may take all of your other usual morning medications with a very small sip of water only.  ?? You may use your asthma inhalers, and please bring them with you on the day of your surgery.        Preoperative Diet Instructions:    Do no eat or drink anything after midnight the night before your surgery unless specifically instructed to by your surgeon or anesthesiologist.    The Day of Your Surgery:    ?? You may brush your teeth and rinse your mouth, but do no swallow the water.  ?? Leave all jewelry at home (including wedding rings).  ?? If you wear contact lenses, glasses, dentures or hearing aids, please bring the case or container to store them in while you are in surgery.  ?? Keep makeup to minimum and do no wear mascara.  Please remove nail polish from index fingers.  ?? Bring a list of current medications and medical problems from your primary care doctor.  ?? Minors must be accompanied by a parent or legal guardian to sign the operative consent form.  ?? The anesthesiologist will discuss with you the most appropriate anesthetic for your specific needs.  ?? After your surgery, you  must be escorted/driven home by a responsible adult.  You may take a taxi if accompanied by a responsible adult who can stay with you after the driver drops you off.

## 2017-02-02 ENCOUNTER — Inpatient Hospital Stay: Admit: 2017-02-02 | Payer: MEDICARE | Primary: Family Medicine

## 2017-02-03 ENCOUNTER — Encounter: Attending: Urology | Primary: Family Medicine

## 2017-02-03 LAB — CULTURE, URINE
Culture result:: NO GROWTH
Culture: NO GROWTH

## 2017-02-06 ENCOUNTER — Encounter

## 2017-02-06 MED ORDER — WARFARIN 1 MG TAB
1 mg | ORAL_TABLET | ORAL | 0 refills | Status: DC
Start: 2017-02-06 — End: 2017-08-16

## 2017-02-06 MED ORDER — WARFARIN 5 MG TAB
5 mg | ORAL_TABLET | ORAL | 0 refills | Status: DC
Start: 2017-02-06 — End: 2017-05-16

## 2017-02-07 ENCOUNTER — Inpatient Hospital Stay: Admit: 2017-02-07 | Payer: MEDICARE | Primary: Family Medicine

## 2017-02-07 DIAGNOSIS — Z01818 Encounter for other preprocedural examination: Secondary | ICD-10-CM

## 2017-02-07 LAB — EKG 12-LEAD
Atrial Rate: 69 {beats}/min
Diagnosis: NORMAL
P Axis: 35 degrees
P-R Interval: 146 ms
Q-T Interval: 352 ms
QRS Duration: 88 ms
QTc Calculation (Bazett): 377 ms
R Axis: -30 degrees
T Axis: 32 degrees
Ventricular Rate: 69 {beats}/min

## 2017-02-07 LAB — EKG, 12 LEAD, INITIAL
Atrial Rate: 69 {beats}/min
Calculated P Axis: 35 degrees
Calculated R Axis: -30 degrees
Calculated T Axis: 32 degrees
Diagnosis: NORMAL
P-R Interval: 146 ms
Q-T Interval: 352 ms
QRS Duration: 88 ms
QTC Calculation (Bezet): 377 ms
Ventricular Rate: 69 {beats}/min

## 2017-02-07 LAB — GLUCOSE, RANDOM: Glucose: 97 mg/dL (ref 70–110)

## 2017-02-08 NOTE — Progress Notes (Signed)
[facility] SURGICAL SERVICES CASE [place] FORM  Phone:  [phone] Fax:  [phone]  Faxes are considered tentatively scheduled until confirmed by the [facility] scheduling office    Surgeon Name: Dr. Abner Greenspan  Surgery Date & Time:  02/10/2017  1230  Surgery Location: Main OR  Patient Name: Julian Bailey  Patient Date Of Birth: [birth year]  Patient Social Security: [id number]  Patient Phone: [phone] (home)   Gender:  male  Patient Type: Outpatient  Anesthesia Type: Mac  CPT Code(s):  I5165004, [employer] Name (if Minor):    Other / Specialty needs:    Patient Pre-op diagnosis: Benign Prostatic Hyperplasia with Lower Urinary Tract Symptoms (N40.1)  Procedure: Cystoscopy with Prostatic Urethral Lift (UroLift, up to 7 Implants)    Insurance (Primary and Secondary): Medicare, [employer] #(s):  [id number], [id number]      [place]

## 2017-02-10 ENCOUNTER — Inpatient Hospital Stay: Payer: MEDICARE

## 2017-02-10 MED ORDER — ONDANSETRON (PF) 4 MG/2 ML INJECTION
4 mg/2 mL | Freq: Once | INTRAMUSCULAR | Status: AC
Start: 2017-02-10 — End: 2017-02-10
  Administered 2017-02-10: 14:00:00 via INTRAVENOUS

## 2017-02-10 MED ORDER — SODIUM CHLORIDE 0.9 % IRRIGATION SOLN
0.9 % | Status: DC
Start: 2017-02-10 — End: 2017-02-10
  Administered 2017-02-10: 16:00:00

## 2017-02-10 MED ORDER — LIDOCAINE (PF) 20 MG/ML (2 %) IV SYRINGE
100 mg/5 mL (2 %) | INTRAVENOUS | Status: DC | PRN
Start: 2017-02-10 — End: 2017-02-10
  Administered 2017-02-10: 15:00:00 via INTRAVENOUS

## 2017-02-10 MED ORDER — MIDAZOLAM 1 MG/ML IJ SOLN
1 mg/mL | INTRAMUSCULAR | Status: AC
Start: 2017-02-10 — End: ?

## 2017-02-10 MED ORDER — SODIUM CHLORIDE 0.9 % IJ SYRG
INTRAMUSCULAR | Status: DC | PRN
Start: 2017-02-10 — End: 2017-02-10

## 2017-02-10 MED ORDER — PHENAZOPYRIDINE 100 MG TAB
100 mg | ORAL_TABLET | Freq: Three times a day (TID) | ORAL | 0 refills | Status: AC
Start: 2017-02-10 — End: 2017-02-12

## 2017-02-10 MED ORDER — CIPROFLOXACIN IN D5W 400 MG/200 ML IV PIGGY BACK
400 mg/200 mL | Freq: Once | INTRAVENOUS | Status: AC
Start: 2017-02-10 — End: 2017-02-10
  Administered 2017-02-10: 15:00:00 via INTRAVENOUS

## 2017-02-10 MED ORDER — LIDOCAINE 2 % MUCOUS MEMBRANE JELLY IN APPLICATOR
2 % | Status: AC
Start: 2017-02-10 — End: ?

## 2017-02-10 MED ORDER — WATER FOR IRRIGATION, STERILE SOLUTION
Status: DC
Start: 2017-02-10 — End: 2017-02-10

## 2017-02-10 MED ORDER — SODIUM CHLORIDE 0.9 % INJECTION
20 mg/2 mL | Freq: Once | INTRAMUSCULAR | Status: AC
Start: 2017-02-10 — End: 2017-02-10
  Administered 2017-02-10: 14:00:00 via INTRAVENOUS

## 2017-02-10 MED ORDER — SODIUM CHLORIDE 0.9 % IJ SYRG
Freq: Three times a day (TID) | INTRAMUSCULAR | Status: DC
Start: 2017-02-10 — End: 2017-02-10

## 2017-02-10 MED ORDER — PROPOFOL 10 MG/ML IV EMUL
10 mg/mL | INTRAVENOUS | Status: DC | PRN
Start: 2017-02-10 — End: 2017-02-10
  Administered 2017-02-10 (×10): via INTRAVENOUS

## 2017-02-10 MED ORDER — FENTANYL CITRATE (PF) 50 MCG/ML IJ SOLN
50 mcg/mL | INTRAMUSCULAR | Status: DC | PRN
Start: 2017-02-10 — End: 2017-02-10

## 2017-02-10 MED ORDER — MIDAZOLAM 1 MG/ML IJ SOLN
1 mg/mL | Freq: Once | INTRAMUSCULAR | Status: AC
Start: 2017-02-10 — End: 2017-02-10
  Administered 2017-02-10: 14:00:00 via INTRAVENOUS

## 2017-02-10 MED ORDER — SODIUM CHLORIDE 0.9 % IRRIGATION SOLN
0.9 % | Status: AC
Start: 2017-02-10 — End: ?

## 2017-02-10 MED ORDER — PROPOFOL 10 MG/ML IV EMUL
10 mg/mL | INTRAVENOUS | Status: AC
Start: 2017-02-10 — End: ?

## 2017-02-10 MED ORDER — LACTATED RINGERS IV
INTRAVENOUS | Status: DC
Start: 2017-02-10 — End: 2017-02-10
  Administered 2017-02-10: 14:00:00 via INTRAVENOUS

## 2017-02-10 MED ORDER — FENTANYL CITRATE (PF) 50 MCG/ML IJ SOLN
50 mcg/mL | INTRAMUSCULAR | Status: DC | PRN
Start: 2017-02-10 — End: 2017-02-10
  Administered 2017-02-10: 15:00:00 via INTRAVENOUS

## 2017-02-10 MED ORDER — ONDANSETRON (PF) 4 MG/2 ML INJECTION
4 mg/2 mL | Freq: Once | INTRAMUSCULAR | Status: DC | PRN
Start: 2017-02-10 — End: 2017-02-10

## 2017-02-10 MED ORDER — SODIUM CHLORIDE 0.9 % IRRIGATION SOLN
0.9 % | Status: DC
Start: 2017-02-10 — End: 2017-02-10

## 2017-02-10 MED ORDER — MIDAZOLAM 1 MG/ML IJ SOLN
1 mg/mL | INTRAMUSCULAR | Status: DC | PRN
Start: 2017-02-10 — End: 2017-02-10
  Administered 2017-02-10: 15:00:00 via INTRAVENOUS

## 2017-02-10 MED ORDER — FENTANYL CITRATE (PF) 50 MCG/ML IJ SOLN
50 mcg/mL | INTRAMUSCULAR | Status: AC
Start: 2017-02-10 — End: ?

## 2017-02-10 MED ORDER — CEPHALEXIN 500 MG CAP
500 mg | ORAL_CAPSULE | Freq: Two times a day (BID) | ORAL | 0 refills | Status: AC
Start: 2017-02-10 — End: 2017-02-20

## 2017-02-10 MED ORDER — LIDOCAINE 2 % MUCOUS MEMBRANE JELLY IN APPLICATOR
2 % | Freq: Once | Status: AC
Start: 2017-02-10 — End: 2017-02-10
  Administered 2017-02-10: 16:00:00 via URETHRAL

## 2017-02-10 MED FILL — CIPROFLOXACIN IN D5W 400 MG/200 ML IV PIGGY BACK: 400 mg/200 mL | INTRAVENOUS | Qty: 200

## 2017-02-10 MED FILL — NORMAL SALINE FLUSH 0.9 % INJECTION SYRINGE: INTRAMUSCULAR | Qty: 10

## 2017-02-10 MED FILL — MIDAZOLAM 1 MG/ML IJ SOLN: 1 mg/mL | INTRAMUSCULAR | Qty: 2

## 2017-02-10 MED FILL — SODIUM CHLORIDE 0.9 % IRRIGATION SOLN: 0.9 % | Qty: 3000

## 2017-02-10 MED FILL — DIPRIVAN 10 MG/ML INTRAVENOUS EMULSION: 10 mg/mL | INTRAVENOUS | Qty: 20

## 2017-02-10 MED FILL — LIDOCAINE 2 % MUCOUS MEMBRANE JELLY IN APPLICATOR: 2 % | Qty: 10

## 2017-02-10 MED FILL — FAMOTIDINE (PF) 20 MG/2 ML IV: 20 mg/2 mL | INTRAVENOUS | Qty: 2

## 2017-02-10 MED FILL — SODIUM CHLORIDE 0.9 % IRRIGATION SOLN: 0.9 % | Qty: 1500

## 2017-02-10 MED FILL — LIDOCAINE (PF) 20 MG/ML (2 %) IV SYRINGE: 100 mg/5 mL (2 %) | INTRAVENOUS | Qty: 5

## 2017-02-10 MED FILL — ONDANSETRON (PF) 4 MG/2 ML INJECTION: 4 mg/2 mL | INTRAMUSCULAR | Qty: 2

## 2017-02-10 MED FILL — FENTANYL CITRATE (PF) 50 MCG/ML IJ SOLN: 50 mcg/mL | INTRAMUSCULAR | Qty: 2

## 2017-02-10 NOTE — Op Note (Signed)
Patient Name: Julian Bailey    Date of Birth: 1945-11-01    Surgeon:Charvez Voorhies P Breiana Stratmann, DO    Date of Service: 02/10/2017    Pre-op Diagnosis: N40.1, Hypertrophy (benign) of prostate, with urinary obstruction and LUTS    Post-op Diagnosis: N40.1, Hypertrophy (benign) of prostate, with urinary obstruction and LUTS    Procedure: UroLift Prostatic Urethral Lift  (4 implants/  2R/ 2L) 1 pull through--total of 5 implants    Antibiotics: cipro 400 mg IV    Anesthesia: MAC     Complications: None    Blood Loss: Minimal    INDICATIONS:  Mr. [name] is a 71 year old male with benign prostatic hyperplasia and bothersome voiding symptoms. Office cystoscopy excluded a significant median lobe component. His symptoms have failed to improve on medical therapy.  After discussion of surgical treatment options, the patient elected a prostatic urethral lift procedure in which permanent transprostatic implants are installed to create a wider channel by which to void. Other surgical alternatives were rejected due to known adverse sexual side effects.      PROCEDURE:  Patient received MAC anesthesia: A 35F cystoscope was inserted into the bladder. The cystoscopy bridge was replaced with a UroLift delivery device. The first treatment site was the patient's left side approximately 1.5cm distal to the bladder neck. The distal tip of the delivery device was then angled laterally approximately 20 degrees at this position to compress the lateral lobe. The trigger was pulled, thereby deploying a needle containing the implant through the prostate. The needle was then retracted, allowing one end of the implant to be delivered to the capsular surface of the prostate. The implant was then tensioned to assure capsular seating and removal of slack monofilament. The device was then angled back toward midline and slowly advanced proximally (typically 3 to 4 mm) until cystoscopic verification of the monofilament being centered in the  delivery bay. The urethral end piece was then affixed to the monofilament thereby tailoring the size of the implant. Excess filament was then severed. The delivery device was then re-advanced into the bladder. The delivery device was then replaced with cystoscope and bridge and the implant location and opening effect was confirmed cystoscopically. The same procedure was then repeated on the right side, and two additional implants were delivered just proximal to the veru montanum, again one on right and one on left side that was a pull through and one additional on the left side of the prostate, following the same technique.  A final cystoscopy was conducted first to inspect the location and state of each implant and second, to confirm the presence of a continuous anterior channel was present through the prostatic urethra with irrigation flow turned off. The bladder was then filled with 150 cc irrigation fluid to assist the patient in void trial after the procedure, and all instruments were removed. The patient was then allowed to sit up. Void trial was later successful and patient did not receive a post-operative urinary catheter.

## 2017-02-10 NOTE — Interval H&P Note (Signed)
H&P Update:  Julian Bailey was seen and examined.  History and physical has been reviewed. The patient has been examined. There have been no significant clinical changes since the completion of the originally dated History and Physical.  Patient identified by surgeon; surgical site was confirmed by patient and surgeon.    Lab Results   Component Value Date/Time    WBC 4.7 09/13/2016 05:53 PM    HGB 14.8 09/13/2016 05:53 PM    HCT 42.4 09/13/2016 05:53 PM    PLATELET 126 (L) 09/13/2016 05:53 PM    MCV 90.6 09/13/2016 05:53 PM     Lab Results   Component Value Date/Time    Sodium 143 09/13/2016 05:53 PM    Potassium 4.3 09/13/2016 05:53 PM    Chloride 108 (H) 09/13/2016 05:53 PM    CO2 29 09/13/2016 05:53 PM    Anion gap 6 09/13/2016 05:53 PM    Glucose 97 02/07/2017 11:00 AM    BUN 16 09/13/2016 05:53 PM    Creatinine 0.88 09/13/2016 05:53 PM    BUN/Creatinine ratio 18 09/13/2016 05:53 PM    GFR est AA >60 09/13/2016 05:53 PM    GFR est non-AA >60 09/13/2016 05:53 PM    Calcium 9.5 09/13/2016 05:53 PM     Visit Vitals   ??? BP 147/81 (BP 1 Location: Right arm, BP Patient Position: At rest)   ??? Pulse 66   ??? Temp 98.1 ??F (36.7 ??C)   ??? Resp 12   ??? Ht 5\' 10"  (1.778 m)   ??? Wt 91.2 kg (201 lb)   ??? SpO2 100%   ??? BMI 28.84 kg/m2         Signed By: Isac Caddy, DO     February 10, 2017 9:37 AM

## 2017-02-10 NOTE — Op Note (Signed)
Patient Name: Julian Bailey    Date of Birth: 1945-08-20    Surgeon:Romar Woodrick P Lanaiya Lantry, DO    Date of Service: 02/10/2017    Pre-op Diagnosis: N40.1, Hypertrophy (benign) of prostate, with urinary obstruction and LUTS    Post-op Diagnosis: N40.1, Hypertrophy (benign) of prostate, with urinary obstruction and LUTS    Procedure: UroLift Prostatic Urethral Lift  (4 implants/  2R/ 2L) 1 pull through--total of 5 implants    Antibiotics: cipro 400 mg IV    Anesthesia: MAC     Complications: None    Blood Loss: Minimal    INDICATIONS:  Mr. [name] is a 71 year old male with benign prostatic hyperplasia and bothersome voiding symptoms. Office cystoscopy excluded a significant median lobe component. His symptoms have failed to improve on medical therapy.  After discussion of surgical treatment options, the patient elected a prostatic urethral lift procedure in which permanent transprostatic implants are installed to create a wider channel by which to void. Other surgical alternatives were rejected due to known adverse sexual side effects.      PROCEDURE:  Patient received MAC anesthesia: A 32F cystoscope was inserted into the bladder. The cystoscopy bridge was replaced with a UroLift delivery device. The first treatment site was the patient's left side approximately 1.5cm distal to the bladder neck. The distal tip of the delivery device was then angled laterally approximately 20 degrees at this position to compress the lateral lobe. The trigger was pulled, thereby deploying a needle containing the implant through the prostate. The needle was then retracted, allowing one end of the implant to be delivered to the capsular surface of the prostate. The implant was then tensioned to assure capsular seating and removal of slack monofilament. The device was then angled back toward midline and slowly advanced proximally (typically 3 to 4 mm) until cystoscopic verification of the monofilament being centered in the  delivery bay. The urethral end piece was then affixed to the monofilament thereby tailoring the size of the implant. Excess filament was then severed. The delivery device was then re-advanced into the bladder. The delivery device was then replaced with cystoscope and bridge and the implant location and opening effect was confirmed cystoscopically. The same procedure was then repeated on the right side, and two additional implants were delivered just proximal to the veru montanum, again one on right and one on left side that was a pull through and one additional on the left side of the prostate, following the same technique.  A final cystoscopy was conducted first to inspect the location and state of each implant and second, to confirm the presence of a continuous anterior channel was present through the prostatic urethra with irrigation flow turned off. The bladder was then filled with 150 cc irrigation fluid to assist the patient in void trial after the procedure, and all instruments were removed. The patient was then allowed to sit up. Void trial was later successful and patient did not receive a post-operative urinary catheter.

## 2017-02-10 NOTE — Anesthesia Post-Procedure Evaluation (Signed)
Post-Anesthesia Evaluation and Assessment    Patient: Julian Bailey MRN: [id number]  SSN: [id number]    Date of Birth: 1945-09-06  Age: 71 y.o.  Sex: male       Cardiovascular Function/Vital Signs  Visit Vitals   ??? BP 135/85   ??? Pulse 79   ??? Temp 36.6 ??C (97.9 ??F)   ??? Resp 12   ??? Ht 5' 10"  (1.778 m)   ??? Wt 91.2 kg (201 lb)   ??? SpO2 98%   ??? BMI 28.84 kg/m2       Patient is status post MAC anesthesia for Procedure(s):  Cystoscopy with Prostatic Urethral Lift (UroLift, up to 7 Implants).    Nausea/Vomiting: None    Postoperative hydration reviewed and adequate.    Pain:  Pain Scale 1: Numeric (0 - 10) (02/10/17 1147)  Pain Intensity 1: 0 (02/10/17 1147)   Managed    Neurological Status:   Neuro (WDL): Within Defined Limits (02/10/17 1210)   At baseline    Mental Status and Level of Consciousness: Arousable    Pulmonary Status:   O2 Device: Room air (02/10/17 1154)   Adequate oxygenation and airway patent    Complications related to anesthesia: None    Post-anesthesia assessment completed. No concerns    Signed By: Corky Sing, MD     February 10, 2017

## 2017-02-10 NOTE — Other (Signed)
Discharge instructions and prescriptions reviewed with patient and patient's wife, Julian Bailey; no other questions/concerns. Will follow up with MD, Defade.

## 2017-02-10 NOTE — Anesthesia Pre-Procedure Evaluation (Addendum)
Anesthetic History   No history of anesthetic complications            Review of Systems / Medical History  Patient summary reviewed, nursing notes reviewed and pertinent labs reviewed    Pulmonary        Sleep apnea           Neuro/Psych   Within defined limits           Cardiovascular    Hypertension: well controlled              Exercise tolerance: >4 METS     GI/Hepatic/Renal  Within defined limits              Endo/Other        Arthritis     Other Findings            Physical Exam    Airway  Mallampati: II  TM Distance: 4 - 6 cm  Neck ROM: normal range of motion   Mouth opening: Normal     Cardiovascular    Rhythm: regular  Rate: normal         Dental  No notable dental hx       Pulmonary  Breath sounds clear to auscultation               Abdominal  GI exam deferred       Other Findings            Anesthetic Plan    ASA: 3  Anesthesia type: MAC          Induction: Intravenous  Anesthetic plan and risks discussed with: Patient

## 2017-02-23 ENCOUNTER — Ambulatory Visit: Admit: 2017-02-23 | Discharge: 2017-02-23 | Payer: MEDICARE | Attending: Urology | Primary: Family Medicine

## 2017-02-23 DIAGNOSIS — N138 Other obstructive and reflux uropathy: Secondary | ICD-10-CM

## 2017-02-23 LAB — AMB POC URINALYSIS DIP STICK AUTO W/O MICRO
Bilirubin (UA POC): NEGATIVE
Blood (UA POC): NEGATIVE
Glucose (UA POC): NEGATIVE
Ketones (UA POC): NEGATIVE
Nitrites (UA POC): NEGATIVE
Protein (UA POC): NEGATIVE
Specific gravity (UA POC): 1.02 (ref 1.001–1.035)
Urobilinogen (UA POC): 0.2 (ref 0.2–1)
pH (UA POC): 6.5 (ref 4.6–8.0)

## 2017-02-23 NOTE — Patient Instructions (Addendum)
[facility]     Office working hours are 8:00am-4:30pm    Office phone number is [phone] and the fax line is [phone]  For non-emergent medical care and clinical advice during office hours  1. Call the office or  2. Send a message to office staff via mychart  Emergency care can be obtained at the [facility] ER, Urgent Care or 911.    Patient Satisfaction Survey   We appreciate you sharing your e-mail address with us. Please watch for our patient  satisfaction survey which you will receive by e-mail. We strive to provide you with the   best care possible. We respect all comments and will take them into consideration to improve our service.  Thank you for your participation.         Diet for a Healthy Bladder: Care Instructions  Your Care Instructions  You can help your bladder stay healthy. Drink lots of water and eat a healthy diet. This may help prevent urinary tract infections and other bladder problems.  Follow-up care is a key part of your treatment and safety. Be sure to make and go to all appointments, and call your doctor if you are having problems. It's also a good idea to know your test results and keep a list of the medicines you take.  How can you care for yourself at home?  ?? Drink plenty of water or other drinks that do not have alcohol. This will help flush bacteria from your bladder. If you have kidney, heart, or liver disease and have to limit fluids, talk with your doctor before you increase the amount of fluids you drink.  ?? Limit or avoid alcohol. Alcohol can irritate the bladder. For some people, caffeine (found in coffee, tea, and cola drinks) also can irritate the bladder.  ?? Avoid constipation. This can help some people who have a problem with an urgent need to urinate a lot.  ?? Include fruits, vegetables, cooked dry beans, and whole grains in your diet each day. These foods are high in fiber.  ?? Get at least 30 minutes of physical activity on most days of the week.  Walking is a good choice. You also may want to do other activities, such as running, swimming, cycling, or playing tennis or team sports.  ?? Take a fiber supplement, such as Citrucel or Metamucil, every day if needed. Read and follow all instructions on the label.  ?? Schedule time each day for a bowel movement. Having a daily routine may help. Take your time and do not strain when having your bowel movement.  Where can you learn more?  Go to http://www.healthwise.net/[employer].  Enter S558 in the search box to learn more about "Diet for a Healthy Bladder: Care Instructions."  Current as of: Nov 06, 2015  Content Version: 11.7  ?? 2006-2018 [employer]. Care instructions adapted under license by [employer] (which disclaims liability or warranty for this information). If you have questions about a medical condition or this instruction, always ask your healthcare professional. Healthwise, Incorporated disclaims any warranty or liability for your use of this information.

## 2017-02-23 NOTE — Progress Notes (Signed)
Male Progress Note      Patient: Julian Bailey               Sex: male             MRN: [id number]      Date of Birth:  September 12, 1945      Age:  71 y.o.               Subjective:     Julian Bailey is a 71 y.o. male who is being seen for follow-up for BPH with LUTS s/p Urolift procedure on 01/2017 with 4 implants placed.  He says that he is doing very well after the surgery.  He is still taking the tamsulosin and proscar.  He says that his urinary stream is good.  He is not having any pressure.  He is not having any gross hematuria.  He does feel that he is emptying his bladder all the way. 15 minutes were spent in face-to-face time with the patient at his visit today, > 50% of which were spent counseling the patient on follow up after urolift and stopping his medication.        Past Medical History:   Diagnosis Date   ??? Arthritis    ??? BPH (benign prostatic hyperplasia)    ??? Cervical disc disease    ??? DVT (deep venous thrombosis) (HCC) x 2 2009  and 2011     after knee surgery then after long car ride    ??? ED (erectile dysfunction)    ??? Hypercoagulopathy (HCC)    ??? Hypertension    ??? 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 Sleep apnea     CPAP; no longer using cpap   ??? 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 HX BACK SURGERY  03/2016    Lumbar "Drill holes and cleaned it out".   ??? HX CATARACT REMOVAL Bilateral    ??? HX ENDOSCOPY      colonoscopy last was in 2008    ??? 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 HX KNEE REPLACEMENT Right        Family History   Problem Relation Age of Onset   ??? Arthritis-osteo Mother    ??? Hypertension Mother    ??? Arthritis-osteo Father    ??? Arthritis-osteo Brother    ??? Cancer Paternal Uncle      unknown   ??? Cancer Paternal Uncle      stomach   ??? Diabetes Paternal Uncle        Social History     Social History   ??? Marital status: MARRIED     Spouse name: N/A   ??? Number of children: N/A   ??? Years of education: N/A      Social History Main Topics   ??? Smoking status: Former Smoker     Packs/day: 0.50     Years: 20.00     Types: Cigarettes     Quit date: 11/30/1984   ??? Smokeless tobacco: Never Used   ??? Alcohol use No   ??? Drug use: No   ??? Sexual activity: Yes     Partners: Female     Other Topics Concern   ??? Blood Transfusions No     Social History Narrative    Medicare wellness 09/23/13        Prior to Admission medications  Medication Sig Start Date End Date Taking? Authorizing Provider   warfarin (COUMADIN) 1 mg tablet As directed 02/06/17  Yes Fredrik Cove, MD   warfarin (COUMADIN) 5 mg tablet TAKE AS DIRECTED 02/06/17  Yes Fredrik Cove, MD   diclofenac EC (VOLTAREN) 75 mg EC tablet Take 75 mg by mouth two (2) times a day. 12/26/16  Yes Historical Provider   hydrocortisone valerate (WESTCORT) 0.2 % topical cream Apply  to affected area two (2) times a day. use thin layer   Yes Historical Provider   testosterone 75 mg pllt by Implant route.   Yes Historical Provider   multivitamin (ONE A DAY) tablet Take 0.5 Tabs by mouth two (2) times a day.   Yes Historical Provider   tamsulosin (FLOMAX) 0.4 mg capsule Take 1 Cap by mouth daily. 12/01/16   Coleta Grosshans P Rosiland Sen, DO   finasteride (PROSCAR) 5 mg tablet Take 1 Tab by mouth daily. 06/13/16   Art Buff Shulamis Wenberg, DO       No Known Allergies    Immunization History   Administered Date(s) Administered   ??? Influenza Vaccine 03/26/2014   ??? Influenza Vaccine (Quad) 03/16/2016   ??? Tdap 05/13/2013         Review of Systems:  General:  no weight loss, fever, night sweats  Head:   negative  Eyes:   wears glasses or bifocals  Respiratory:  Patient denies cough, chest pain, dyspnea, wheezing or hemoptysis  Cardiac:  negative  Gatrointestinal: negative  Urinary:  BPH with LUTS  Musculoskeletal: back pain, joint pain  Endocrine:  Denies: polydipsia/polyuria, palpitations, skin changes, temperature intolerance  Hematological: negative  Psychological: negative  Skin:   Denies: rash, itching, hives       Objective:      Visit Vitals   ??? BP 122/82   ??? Pulse 66   ??? Temp 98.8 ??F (37.1 ??C) (Temporal)   ??? Ht 5\' 10"  (1.778 m)   ??? Wt 201 lb (91.2 kg)   ??? BMI 28.84 kg/m2         Labs:  Urinalysis:    Results for orders placed or performed in visit on 02/23/17   AMB POC URINALYSIS DIP STICK AUTO W/O MICRO     Status: None   Result Value Ref Range Status    Color (UA POC) Yellow  Final    Clarity (UA POC) Clear  Final    Glucose (UA POC) Negative Negative Final    Bilirubin (UA POC) Negative Negative Final    Ketones (UA POC) Negative Negative Final    Specific gravity (UA POC) 1.020 1.001 - 1.035 Final    Blood (UA POC) Negative Negative Final    pH (UA POC) 6.5 4.6 - 8.0 Final    Protein (UA POC) Negative Negative Final    Urobilinogen (UA POC) 0.2 mg/dL 0.2 - 1 Final    Nitrites (UA POC) Negative Negative Final    Leukocyte esterase (UA POC) Trace Negative Final         Imaging:   None reviewed    PVR:  Not done today    SHIM Score: not done    AUA/QOL score:  9/1 was 12/3 on tamsulosin and proscar      Physical Exam:   Constitutional: Normal appearing patient with well groomed; No deformities; Body habitus appears normal; Nutritionally sound.   Neck: Supple; No JVD; No cervical lymphadenopathy; No carotid bruits; No masses; trachea midline; No thyromegaly  Respiratory: No use  of accessory muscles; No intercostal retractions; Clear to auscultation bilaterally  Heart: Regular rate and rhythm; No murmurs, rubs, gallops  GI: soft, non tender, normal bowel sounds, no palpable masses, no organomegaly    GU:   Deferred    Digital Rectal Exam:   Deferred    Musculoskeletal:   No CVA tenderness; Good sensation, ROM, and strength bilaterally of the Upper and Lower extremities.    Assessment/Plan     1. BPH with LUTS--stop tamsulosin today. Continue proscar daily.  I will see back in 2 months for uroflow procedure and office visit.

## 2017-03-08 NOTE — Telephone Encounter (Signed)
Patient notified, verbalized understanding of PT/INR results and further testing.  Per Dr. Durene CalHunter, patient is to continue current dose and retest in 4 weeks.

## 2017-03-14 ENCOUNTER — Encounter: Attending: Family Medicine | Primary: Family Medicine

## 2017-04-05 ENCOUNTER — Encounter: Attending: Family Medicine | Primary: Family Medicine

## 2017-04-06 ENCOUNTER — Encounter: Attending: Family Medicine | Primary: Family Medicine

## 2017-04-20 ENCOUNTER — Encounter: Attending: Family Medicine | Primary: Family Medicine

## 2017-04-26 ENCOUNTER — Ambulatory Visit: Admit: 2017-04-26 | Discharge: 2017-04-26 | Payer: MEDICARE | Attending: Urology | Primary: Family Medicine

## 2017-04-26 DIAGNOSIS — N138 Other obstructive and reflux uropathy: Secondary | ICD-10-CM

## 2017-04-26 LAB — AMB POC URINALYSIS DIP STICK AUTO W/O MICRO
Bilirubin (UA POC): NEGATIVE
Blood (UA POC): NEGATIVE
Glucose (UA POC): NEGATIVE
Ketones (UA POC): NEGATIVE
Leukocyte esterase (UA POC): NEGATIVE
Nitrites (UA POC): NEGATIVE
Protein (UA POC): NEGATIVE
Specific gravity (UA POC): 1.015 (ref 1.001–1.035)
Urobilinogen (UA POC): 0.2 (ref 0.2–1)
pH (UA POC): 7.5 (ref 4.6–8.0)

## 2017-04-26 NOTE — Progress Notes (Signed)
Male Progress Note      Patient: Julian Bailey               Sex: male             MRN: [id number]      Date of Birth:  29-Sep-1945      Age:  71 y.o.               Subjective:     Julian Bailey is a 71 y.o. male who is being seen for follow-up for BPH with LUTS s/p Urolift procedure on 01/2017 with 4 implants placed.  He says that he is doing very well after the surgery.  He is still taking proscar, but has stopped tamsulosin.  He says that his urinary stream is good.  He is not having any pressure.  He is not having any gross hematuria.  He does feel that he is emptying his bladder all the way. He says that his ejaculations are back to normal now.  He says that things are night and day compared to before surgery.  He is very pleased.    He is due for colonoscopy next year.  15 minutes were spent in face-to-face time with the patient at his visit today, > 50% of which were spent counseling the patient on follow up after urolift and stopping his medication.        Past Medical History:   Diagnosis Date   ??? Arthritis    ??? BPH (benign prostatic hyperplasia)    ??? Cervical disc disease    ??? DVT (deep venous thrombosis) (HCC) x 2 2009  and 2011     after knee surgery then after long car ride    ??? ED (erectile dysfunction)    ??? Hypercoagulopathy (HCC)    ??? Hypertension    ??? 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 Sleep apnea     CPAP; no longer using cpap   ??? Thromboembolus [facility])        Past Surgical History:   Procedure Laterality Date   ??? HX BACK SURGERY  03/2016    Lumbar "Drill holes and cleaned it out".   ??? HX CATARACT REMOVAL Bilateral    ??? HX ENDOSCOPY      colonoscopy last was in 2008    ??? 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 HX KNEE REPLACEMENT Right        Family History   Problem Relation Age of Onset   ??? Arthritis-osteo Mother    ??? Hypertension Mother    ??? Arthritis-osteo Father    ??? Arthritis-osteo Brother    ??? Cancer Paternal Uncle         unknown   ??? Cancer Paternal Uncle         stomach    ??? Diabetes Paternal Uncle        Social History     Socioeconomic History   ??? Marital status: MARRIED     Spouse name: Not on file   ??? Number of children: Not on file   ??? Years of education: Not on file   ??? Highest education level: Not on file   Social Needs   ??? Financial resource strain: Not on file   ??? Food insecurity - worry: Not on file   ??? Food insecurity - inability: Not on file   ??? Transportation needs - medical: Not on file   ??? Transportation needs -  non-medical: Not on file   Occupational History   ??? Not on file   Tobacco Use   ??? Smoking status: Former Smoker     Packs/day: 0.50     Years: 20.00     Pack years: 10.00     Types: Cigarettes     Last attempt to quit: 11/30/1984     Years since quitting: 32.4   ??? Smokeless tobacco: Never Used   Substance and Sexual Activity   ??? Alcohol use: No   ??? Drug use: No   ??? Sexual activity: Yes     Partners: Female   Other Topics Concern   ??? Military Service Not Asked   ??? Blood Transfusions No   ??? Caffeine Concern Not Asked   ??? Occupational Exposure Not Asked   ??? Hobby Hazards Not Asked   ??? Sleep Concern Not Asked   ??? Stress Concern Not Asked   ??? Weight Concern Not Asked   ??? Special Diet Not Asked   ??? Back Care Not Asked   ??? Exercise Not Asked   ??? Bike Helmet Not Asked   ??? Seat Belt Not Asked   ??? Self-Exams Not Asked   Social History Narrative    Medicare wellness 09/23/13        Prior to Admission medications    Medication Sig Start Date End Date Taking? Authorizing Provider   warfarin (COUMADIN) 1 mg tablet As directed 02/06/17  Yes Fredrik Cove, MD   warfarin (COUMADIN) 5 mg tablet TAKE AS DIRECTED 02/06/17  Yes Fredrik Cove, MD   diclofenac EC (VOLTAREN) 75 mg EC tablet Take 75 mg by mouth two (2) times a day. 12/26/16  Yes Provider, Historical   finasteride (PROSCAR) 5 mg tablet Take 1 Tab by mouth daily. 06/13/16  Yes Ramsey Guadamuz, Arlys John P, DO   hydrocortisone valerate (WESTCORT) 0.2 % topical cream Apply  to affected  area two (2) times a day. use thin layer   Yes Provider, Historical   testosterone 75 mg pllt by Implant route.   Yes Provider, Historical   multivitamin (ONE A DAY) tablet Take 0.5 Tabs by mouth two (2) times a day.   Yes Provider, Historical   tamsulosin (FLOMAX) 0.4 mg capsule Take 1 Cap by mouth daily. 12/01/16   Mckynlee Luse, Art Buff, DO       No Known Allergies    Immunization History   Administered Date(s) Administered   ??? Influenza Vaccine 03/26/2014   ??? Influenza Vaccine (Quad) 03/16/2016   ??? Tdap 05/13/2013         Review of Systems:  General:  no weight loss, fever, night sweats  Head:   negative  Eyes:   wears glasses or bifocals  Respiratory:  Patient denies cough, chest pain, dyspnea, wheezing or hemoptysis  Cardiac:  negative  Gatrointestinal: negative  Urinary:  BPH with LUTS  Musculoskeletal: back pain, joint pain  Endocrine:  Denies: polydipsia/polyuria, palpitations, skin changes, temperature intolerance  Hematological: negative  Psychological: negative  Skin:   Denies: rash, itching, hives      Objective:      Visit Vitals  BP (!) 164/98 (BP 1 Location: Right arm, BP Patient Position: Sitting)   Pulse 90   Temp 98.9 ??F (37.2 ??C) (Temporal)   Resp 18   Wt 201 lb (91.2 kg)   SpO2 98%   BMI 28.84 kg/m??         Labs:  Urinalysis:    Results for orders placed  or performed in visit on 02/23/17   AMB POC URINALYSIS DIP STICK AUTO W/O MICRO     Status: None   Result Value Ref Range Status    Color (UA POC) Yellow  Final    Clarity (UA POC) Clear  Final    Glucose (UA POC) Negative Negative Final    Bilirubin (UA POC) Negative Negative Final    Ketones (UA POC) Negative Negative Final    Specific gravity (UA POC) 1.020 1.001 - 1.035 Final    Blood (UA POC) Negative Negative Final    pH (UA POC) 6.5 4.6 - 8.0 Final    Protein (UA POC) Negative Negative Final    Urobilinogen (UA POC) 0.2 mg/dL 0.2 - 1 Final    Nitrites (UA POC) Negative Negative Final    Leukocyte esterase (UA POC) Trace Negative Final      Uroflometry-prior to surgery  Voided Volume 267 ml   Maximum Flow Rate 20.2 ml/sec   Average Flow Rate 7.6 ml/sec   Voiding Time 38.7 mm:ss:S   Flow Pattern continuous   Post Void Residual 89 ml   Comments:     Uroflometry  Voided Volume 502 ml   Maximum Flow Rate 17.6 ml/sec   Average Flow Rate 7.1 ml/sec   Voiding Time 1:19.5 mm:ss:S   Flow Pattern continuous   Post Void Residual 27 ml   Comments: on proscar     Lab Results   Component Value Date/Time    Prostate Specific Ag 1.5 01/02/2017 08:15 AM    Prostate Specific Ag 2.4 03/16/2016 10:15 AM    Prostate Specific Ag 2.9 12/09/2014 08:40 AM       Imaging:   None reviewed    PVR:  Not done today    SHIM Score: not done    AUA/QOL score:  4/0 was 9/1 was 12/3 on tamsulosin and proscar      Physical Exam:   Constitutional: Normal appearing patient with well groomed; No deformities; Body habitus appears normal; Nutritionally sound.   Neck: Supple; No JVD; No cervical lymphadenopathy; No carotid bruits; No masses; trachea midline; No thyromegaly  Respiratory: No use of accessory muscles; No intercostal retractions; Clear to auscultation bilaterally  Heart: Regular rate and rhythm; No murmurs, rubs, gallops  GI: soft, non tender, normal bowel sounds, no palpable masses, no organomegaly    GU:   Deferred    Digital Rectal Exam:   Deferred    Musculoskeletal:   No CVA tenderness; Good sensation, ROM, and strength bilaterally of the Upper and Lower extremities.    Assessment/Plan     1. BPH with LUTS--Continue proscar daily.  I will see back in 6 months for yearly exam.

## 2017-04-26 NOTE — Addendum Note (Signed)
Addended by: Carolina CellarJAMISON, Leza Apsey A on: 04/26/2017 03:14 PM     Modules accepted: Orders

## 2017-04-26 NOTE — Patient Instructions (Addendum)
[facility]     Office working hours are 8:00am-4:30pm    Office phone number is [phone] and the fax line is [phone]  For non-emergent medical care and clinical advice during office hours  1. Call the office or  2. Send a message to office staff via mychart  Emergency care can be obtained at the [facility] ER, Urgent Care or 911.    Patient Satisfaction Survey   We appreciate you sharing your e-mail address with us. Please watch for our patient  satisfaction survey which you will receive by e-mail. We strive to provide you with the   best care possible. We respect all comments and will take them into consideration to improve our service.  Thank you for your participation.         Benign Prostatic Hyperplasia: Care Instructions  Your Care Instructions    Benign prostatic hyperplasia, or BPH, is an enlarged prostate gland. The prostate is a small gland that makes some of the fluid in semen. Prostate enlargement happens to almost all men as they age. It is usually not serious. BPH does not cause prostate cancer.  As the prostate gets bigger, it may partly block the flow of urine. You may have a hard time getting a urine stream started or completely stopped. BPH can cause dribbling. You may have a weak urine stream, or you may have to urinate more often than you used to, especially at night. Most men find these problems easy to manage.  You do not need treatment unless your symptoms bother you a lot or you have other problems, such as bladder infections or stones. In these cases, medicines may help. Surgery is not needed unless the urine flow is blocked or the symptoms do not get better with medicine.  Follow-up care is a key part of your treatment and safety. Be sure to make and go to all appointments, and call your doctor if you are having problems. It's also a good idea to know your test results and keep a list of the medicines you take.  How can you care for yourself at home?   ?? Take plenty of time to urinate. Try to relax.  ?? Try "double voiding." Urinate as much you can, relax for a few moments, and then try to urinate again.  ?? Sit on the toilet to urinate.  ?? Read or think of other things while you are waiting.  ?? Turn on a faucet, or try to picture running water. Some men find that this helps get their urine flowing.  ?? If dribbling is a problem, wash your penis daily to avoid skin irritation and infection.  ?? Avoid caffeine and alcohol. These drinks will increase how often you need to urinate. Spread your fluid intake throughout the day. If the urge to urinate often wakes you at night, limit your fluid intake in the evening. Urinate right before you go to bed.  ?? Many over-the-counter cold and allergy medicines can make the symptoms of BPH worse. Avoid antihistamines, decongestants, and allergy pills, if you can. Read the warnings on the package.  ?? If you take any prescription medicines, especially tranquilizers or antidepressants, ask your doctor or pharmacist whether they can cause urination problems. There may be other medicines you can use that do not cause urinary problems.  ?? Be safe with medicines. Take your medicines exactly as prescribed. Call your doctor if you think you are having a problem with your medicine.  When should you   call for help?  Call your doctor now or seek immediate medical care if:  ?? ?? You cannot urinate at all.   ?? ?? You have symptoms of a urinary infection. For example:  ? You have blood or pus in your urine.  ? You have pain in your back just below your rib cage. This is called flank pain.  ? You have a fever, chills, or body aches.  ? It hurts to urinate.  ? You have groin or belly pain.   ??Watch closely for changes in your health, and be sure to contact your doctor if:  ?? ?? It hurts when you ejaculate.   ?? ?? Your urinary problems get a lot worse or bother you a lot.   Where can you learn more?  Go to http://www.healthwise.net/[employer].   Enter C033 in the search box to learn more about "Benign Prostatic Hyperplasia: Care Instructions."  Current as of: May 29, 2016  Content Version: 11.8  ?? 2006-2018 Healthwise[employer] Inc[employer]. Care instructions adapted under license by [employer] (which disclaims liability or warranty for this information). If you have questions about a medical condition or this instruction, always ask your healthcare professional. Healthwise, Incorporated disclaims any warranty or liability for your use of this information.

## 2017-05-03 ENCOUNTER — Ambulatory Visit: Admit: 2017-05-03 | Discharge: 2017-05-03 | Payer: MEDICARE | Attending: Family Medicine | Primary: Family Medicine

## 2017-05-03 DIAGNOSIS — J321 Chronic frontal sinusitis: Secondary | ICD-10-CM

## 2017-05-03 MED ORDER — DEXAMETHASONE SODIUM PHOSPHATE 4 MG/ML IJ SOLN
4 mg/mL | Freq: Once | INTRAMUSCULAR | 0 refills | Status: AC
Start: 2017-05-03 — End: 2017-05-03

## 2017-05-03 MED ORDER — METHYLPREDNISOLONE 4 MG TABS IN A DOSE PACK
4 mg | ORAL | 0 refills | Status: DC
Start: 2017-05-03 — End: 2017-05-12

## 2017-05-03 NOTE — Progress Notes (Signed)
[facility]  Dr. Elmer Sow MD  [address]  [place], [place] [address]  [phone]      Name:  Julian Bailey  D.O.B:  12/08/1945  Age: 71 y.o.  PCP: Elmer Sow, MD      Encounter Date:  05/03/2017    CHIEF COMPLAINT:  Sinus Congestion    HISTORY OF PRESENT ILLNESS  Julian Bailey is a 71 y.o. male. Comes to the clinic complaining of sore throat, sinus congestion, nonproductive cough for the past 5 days. New problem. Also reports green/yellow nasal discharge. Symptoms are worsening. Has tried OTC medications without improvement in symptoms. Nonsmoker, no history of COPD or seasonal allergies. Denies fever, chills, or SOB. No nausea, vomiting or diarrhea.   Was scheduled for Medicare Wellness today- wants to come back later for this.     Current Medications  Current Outpatient Medications   Medication Sig Dispense Refill   ??? glucosamine HCl/chondroitin su (GLUCOSAMINE-CHONDROITIN PO) Take  by mouth.     ??? warfarin (COUMADIN) 1 mg tablet As directed 180 Tab 0   ??? warfarin (COUMADIN) 5 mg tablet TAKE AS DIRECTED 90 Tab 0   ??? diclofenac EC (VOLTAREN) 75 mg EC tablet Take 75 mg by mouth two (2) times a day.     ??? finasteride (PROSCAR) 5 mg tablet Take 1 Tab by mouth daily. 90 Tab 3   ??? hydrocortisone valerate (WESTCORT) 0.2 % topical cream Apply  to affected area two (2) times a day. use thin layer     ??? testosterone 75 mg pllt by Implant route.     ??? multivitamin (ONE A DAY) tablet Take 0.5 Tabs by mouth two (2) times a day.         Allergies  No Known Allergies    Past Medical History  Past Medical History:   Diagnosis Date   ??? Arthritis    ??? BPH (benign prostatic hyperplasia)    ??? Cervical disc disease    ??? DVT (deep venous thrombosis) (HCC) x 2 2009  and 2011     after knee surgery then after long car ride    ??? ED (erectile dysfunction)    ??? Hypercoagulopathy (HCC)    ??? Hypertension    ??? 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 Sleep apnea      CPAP; no longer using cpap   ??? Thromboembolus [facility])        Past Surgical History  Past Surgical History:   Procedure Laterality Date   ??? HX BACK SURGERY  03/2016    Lumbar "Drill holes and cleaned it out".   ??? HX CATARACT REMOVAL Bilateral    ??? HX ENDOSCOPY      colonoscopy last was in 2008    ??? 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 HX KNEE REPLACEMENT Right        Social History  Social History     Socioeconomic History   ??? Marital status: MARRIED     Spouse name: Not on file   ??? Number of children: Not on file   ??? Years of education: Not on file   ??? Highest education level: Not on file   Social Needs   ??? Financial resource strain: Not on file   ??? Food insecurity - worry: Not on file   ??? Food insecurity - inability: Not on file   ??? Transportation needs - medical: Not on file   ???  Transportation needs - non-medical: Not on file   Occupational History   ??? Not on file   Tobacco Use   ??? Smoking status: Former Smoker     Packs/day: 0.50     Years: 20.00     Pack years: 10.00     Types: Cigarettes     Last attempt to quit: 11/30/1984     Years since quitting: 32.4   ??? Smokeless tobacco: Never Used   Substance and Sexual Activity   ??? Alcohol use: No   ??? Drug use: No   ??? Sexual activity: Yes     Partners: Female   Other Topics Concern   ??? Military Service Not Asked   ??? Blood Transfusions No   ??? Caffeine Concern Not Asked   ??? Occupational Exposure Not Asked   ??? Hobby Hazards Not Asked   ??? Sleep Concern Not Asked   ??? Stress Concern Not Asked   ??? Weight Concern Not Asked   ??? Special Diet Not Asked   ??? Back Care Not Asked   ??? Exercise Not Asked   ??? Bike Helmet Not Asked   ??? Seat Belt Not Asked   ??? Self-Exams Not Asked   Social History Narrative    Medicare wellness 09/23/13          Family History  Family History   Problem Relation Age of Onset   ??? Arthritis-osteo Mother    ??? Hypertension Mother    ??? Arthritis-osteo Father    ??? Arthritis-osteo Brother    ??? Cancer Paternal Uncle         unknown   ??? Cancer Paternal Uncle          stomach   ??? Diabetes Paternal Uncle              Review of Systems  10 system ROS completed and found to be negative except for those mentioned in HPI       Visit Vitals  BP 136/88   Pulse 92   Temp 98.6 ??F (37 ??C)   Resp 16   Ht 5\' 10"  (1.778 m)   Wt 209 lb 6.4 oz (95 kg)   SpO2 98%   BMI 30.05 kg/m??       Physical Exam   Constitutional: He is oriented to person, place, and time. He appears well-developed and well-nourished.   HENT:   Head: Normocephalic and atraumatic. Head is without right periorbital erythema and without left periorbital erythema.   Right Ear: Tympanic membrane, external ear and ear canal normal.   Left Ear: Tympanic membrane, external ear and ear canal normal.   Nose: Mucosal edema and rhinorrhea present. Right sinus exhibits frontal sinus tenderness. Left sinus exhibits frontal sinus tenderness.   Mouth/Throat: Mucous membranes are normal. Posterior oropharyngeal edema and posterior oropharyngeal erythema present. No oropharyngeal exudate or tonsillar abscesses.   Eyes: EOM are normal. Pupils are equal, round, and reactive to light.   Neck: Normal range of motion. Neck supple. No thyromegaly present.   Cardiovascular: Normal rate. Exam reveals no gallop and no friction rub.   No murmur heard.  Pulmonary/Chest: Effort normal and breath sounds normal. He has no decreased breath sounds. He has no wheezes.   Abdominal: Soft. Bowel sounds are normal. He exhibits no distension. There is no tenderness.   Musculoskeletal: Normal range of motion.   Lymphadenopathy:     He has no cervical adenopathy.   Neurological: He is alert and oriented to person, place,  and time.   Skin: Skin is warm and dry. No rash noted.   Psychiatric: He has a normal mood and affect.   Vitals reviewed.          Assessment/Plan:    Diagnoses and all orders for this visit:    1. Frontal sinusitis, unspecified chronicity  -     dexamethasone (DECADRON) 4 mg/mL injection; 2.5 mL by IntraMUSCular route once for 1 dose.   -     DEXAMETHASONE SODIUM PHOSPHATE INJECTION 1 MG  -     PR THER/PROPH/DIAG INJECTION, SUBCUT/IM  - likely viral, will not give Antibiotics at this time due to current symptoms and length of symptoms   -supportive care reviewed   - ER/RTC precautions reviewed     2. Encounter for immunization  -     INFLUENZA VACCINE QUADRIVALENT VIAL, SPLIT, 3 YRS PLUS IM  -     ADMIN INFLUENZA VIRUS VAC  -     ADMIN PNEUMOCOCCAL VACCINE  -     PNEUMOCOCCAL CONJ VACCINE 13 VALENT IM      Follow-up Disposition:  Return for [facility].

## 2017-05-03 NOTE — Patient Instructions (Signed)
[facility]    Office working hours are Monday - Friday 8:00AM-6:00PM.  First and Third Monday are 8:00AM-7:30PM.  Office phone number is [phone] and fax is [phone].    For non-emergent medical care and clinical advice during office hours:   1. Call office or   2.?? Send message or request using MyChart  For non-emergent medical care and clinical advice after office hours:  1.?? Send message or request using MyChart   2.?? Call [phone] and leave a message with our answering service or  ??  Emergency care can be obtained at the [facility] ER, Urgent Care or by calling 911.  ??  Patient Satisfaction Survey  As a valued patient, you will be receiving a survey from [employer].  We encourage you to share your thoughts and opinions about the care you received today. Thank you for choosing [facility]  ????  All medications ordered during your visit were transmitted electronically to the pharmacy unless you were given a printed prescription. We will notify you of results of laboratory or pathology testing done today as soon as they are available.  Please get any lab work or radiology testing ordered at this appointment as directed or prior to your next appointment. These can be done at [facility] or the [facility] in the [facility].   Centralized scheduling will contact your regarding any testing ordered at this appointment to schedule and/or the Centralized Referral Center will contact you regarding any referrals which have been made. If you have any questions regarding these appointments, call the office at [phone].   ??  BRING ALL MEDICATION BOTTLES TO EVERY APPOINTMENT  ??  Medication Refills -   * Please do not let your medication run out before calling for a refill, the best practice would be to call when you have 2 weeks of medication left.  * Please allow 48 hours for medications to be called into your pharmacy  when you request a medication refill    ??  Have MyChart? Use it to request medication refills, it's quick and easy! If you have questions about MyChart please feel free to ask our staff!

## 2017-05-10 NOTE — Telephone Encounter (Signed)
Please schedule patient for appointment

## 2017-05-10 NOTE — Telephone Encounter (Signed)
Said his blood pressure is running 150s/90s for about two to three weeks. Has been off his blood pressure medication for about a year. Wants to know if he needs to come in or if he can be restarted on his old blood pressure medication.  His number is [phone]

## 2017-05-10 NOTE — Telephone Encounter (Signed)
Appointment given for 05/15/2017 at 09:40AM

## 2017-05-12 ENCOUNTER — Ambulatory Visit: Admit: 2017-05-12 | Discharge: 2017-05-12 | Payer: MEDICARE | Attending: Physician Assistant | Primary: Family Medicine

## 2017-05-12 DIAGNOSIS — J0100 Acute maxillary sinusitis, unspecified: Secondary | ICD-10-CM

## 2017-05-12 MED ORDER — AMOXICILLIN CLAVULANATE 875 MG-125 MG TAB
875-125 mg | ORAL_TABLET | Freq: Two times a day (BID) | ORAL | 0 refills | Status: AC
Start: 2017-05-12 — End: 2017-05-22

## 2017-05-12 MED ORDER — DEXAMETHASONE SODIUM PHOSPHATE (PF) 10 MG/ML INJECTION
10 mg/mL | Freq: Once | INTRAMUSCULAR | 0 refills | Status: AC
Start: 2017-05-12 — End: 2017-05-12

## 2017-05-12 MED ORDER — PREDNISONE 20 MG TAB
20 mg | ORAL_TABLET | ORAL | 0 refills | Status: DC
Start: 2017-05-12 — End: 2018-01-31

## 2017-05-12 MED ORDER — LISINOPRIL 10 MG TAB
10 mg | ORAL_TABLET | Freq: Every day | ORAL | 5 refills | Status: DC
Start: 2017-05-12 — End: 2017-05-16

## 2017-05-12 MED ORDER — CEFTRIAXONE 1 GRAM SOLUTION FOR INJECTION
1 gram | Freq: Once | INTRAMUSCULAR | 0 refills | Status: AC
Start: 2017-05-12 — End: 2017-05-12

## 2017-05-12 NOTE — Progress Notes (Signed)
Subjective:   Julian Bailey is a 71 y.o. male who complains of congestion, cough, nasal blockage, post nasal drip, headache and bilateral sinus pain for 21 days, gradually worsening since that time.  She denies a history of nausea, shortness of breath, vomiting and wheezing.    Evaluation to date: none.   Treatment to date: OTC products.  Patient does not smoke cigarettes.  Relevant PMH: No pertinent additional PMH.    Patient Active Problem List    Diagnosis Date Noted   ??? Central stenosis of spinal canal 01/03/2017   ??? 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 Multilevel degenerative disc disease 01/03/2017   ??? Sciatica associated with disorder of lumbosacral spine 01/03/2017   ??? Right inguinal hernia 06/13/2016   ??? 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 Hypertension 04/18/2016   ??? 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 Hypercoagulopathy (HCC)    ??? Acute non-recurrent sinusitis 01/26/2016   ??? Benign non-nodular prostatic hyperplasia with lower urinary tract symptoms 12/09/2014   ??? Erectile dysfunction 10/30/2013   ??? Incomplete bladder emptying 10/30/2013   ??? Anticoagulated on Coumadin 05/14/2013   ??? Dyslipidemia 05/14/2013   ??? 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 lisinopril (PRINIVIL, ZESTRIL) 10 mg tablet Take 1 Tab by mouth daily. 30 Tab 5   ??? cefTRIAXone (ROCEPHIN) 1 gram injection 1 g by IntraMUSCular route once for 1 dose. 1 Vial 0   ??? dexamethasone, PF, (DECADRON) 10 mg/mL injection 1 mL by IntraMUSCular route once for 1 dose. 1 mL 0   ??? amoxicillin-clavulanate (AUGMENTIN) 875-125 mg per tablet Take 1 Tab by mouth every twelve (12) hours for 10 days. 20 Tab 0   ??? predniSONE (DELTASONE) 20 mg tablet 20 mg BID for 6 days then 20 mg daily for 4 days. 16 Tab 0   ??? glucosamine HCl/chondroitin su (GLUCOSAMINE-CHONDROITIN PO) Take  by mouth.     ??? warfarin (COUMADIN) 1 mg tablet As directed 180 Tab 0    ??? warfarin (COUMADIN) 5 mg tablet TAKE AS DIRECTED 90 Tab 0   ??? diclofenac EC (VOLTAREN) 75 mg EC tablet Take 75 mg by mouth two (2) times a day.     ??? finasteride (PROSCAR) 5 mg tablet Take 1 Tab by mouth daily. 90 Tab 3   ??? hydrocortisone valerate (WESTCORT) 0.2 % topical cream Apply  to affected area two (2) times a day. use thin layer     ??? testosterone 75 mg pllt by Implant route.     ??? multivitamin (ONE A DAY) tablet Take 0.5 Tabs by mouth two (2) times a day.       No Known Allergies  Past Medical History:   Diagnosis Date   ??? Arthritis    ??? BPH (benign prostatic hyperplasia)    ??? Cervical disc disease    ??? DVT (deep venous thrombosis) (HCC) x 2 2009  and 2011     after knee surgery then after long car ride    ??? ED (erectile dysfunction)    ??? Hypercoagulopathy (HCC)    ??? Hypertension    ??? 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 Sleep apnea     CPAP; no longer using cpap   ??? Thromboembolus (HCC)         Review of Systems  Review of Systems - General ROS: positive for  -  chills and fatigue  negative for - fever  ENT ROS: positive for - headaches, nasal congestion, nasal discharge, sinus pain, sore throat and vocal changes  negative for - epistaxis, hearing change, nasal polyps, oral lesions, tinnitus, vertigo or visual changes  Respiratory ROS: no cough, shortness of breath, or wheezing      Objective:     Visit Vitals  BP (!) 152/94   Pulse 88   Temp 99 ??F (37.2 ??C) (Oral)   Resp 24   Ht 5\' 10"  (1.778 m)   Wt 208 lb (94.3 kg)   BMI 29.84 kg/m??       General:  alert, cooperative, no distress   Eyes: conjunctivae/corneas clear. PERRL, EOM's intact. Fundi benign   Ears: abnormal TM AD - dull, abnormal TM AS - dull   Sinuses: tenderness over bilateral maxillary   Mouth:  Lips, mucosa, and tongue normal. Teeth and gums normal and abnormal findings: mild oropharyngeal erythema and posterior lymphoid hyperplasia   Neck: supple, symmetrical, trachea midline and mild anterior cervical adenopathy.    Heart: normal rate and regular rhythm, no murmurs noted.    Lungs: clear to auscultation bilaterally   Abdomen: soft, non-tender. Bowel sounds normal. No masses,  no organomegaly        Assessment/Plan:   Diagnoses and all orders for this visit:    1. Acute maxillary sinusitis, recurrence not specified  -     cefTRIAXone (ROCEPHIN) 1 gram injection; 1 g by IntraMUSCular route once for 1 dose.  -     CEFTRIAXONE SODIUM INJECTION PER 250 MG  -     PR THER/PROPH/DIAG INJECTION, SUBCUT/IM  -     dexamethasone, PF, (DECADRON) 10 mg/mL injection; 1 mL by IntraMUSCular route once for 1 dose.  -     DEXAMETHASONE SODIUM PHOSPHATE INJECTION 1 MG  -     PR THER/PROPH/DIAG INJECTION, SUBCUT/IM  -     amoxicillin-clavulanate (AUGMENTIN) 875-125 mg per tablet; Take 1 Tab by mouth every twelve (12) hours for 10 days.  -     predniSONE (DELTASONE) 20 mg tablet; 20 mg BID for 6 days then 20 mg daily for 4 days.    2. Essential hypertension  -     lisinopril (PRINIVIL, ZESTRIL) 10 mg tablet; Take 1 Tab by mouth daily.          Patient started on lisinopril   Reviewed potential side effects, drug interactions, instructions for taking, and informed of the consequences for not taking the medication(s).  Patient verbalized understanding and repeated back instructions for taking.        Follow-up Disposition:  Return if symptoms worsen or fail to improve.  reviewed medications and side effects in detail.

## 2017-05-15 ENCOUNTER — Encounter: Attending: Family Medicine | Primary: Family Medicine

## 2017-05-16 ENCOUNTER — Ambulatory Visit: Admit: 2017-05-16 | Discharge: 2017-05-16 | Payer: MEDICARE | Attending: Family Medicine | Primary: Family Medicine

## 2017-05-16 DIAGNOSIS — Z0000 Encounter for general adult medical examination without abnormal findings: Secondary | ICD-10-CM

## 2017-05-16 MED ORDER — WARFARIN 5 MG TAB
5 mg | ORAL_TABLET | ORAL | 0 refills | Status: DC
Start: 2017-05-16 — End: 2017-08-16

## 2017-05-16 MED ORDER — LISINOPRIL 10 MG TAB
10 mg | ORAL_TABLET | Freq: Two times a day (BID) | ORAL | 5 refills | Status: DC
Start: 2017-05-16 — End: 2018-08-13

## 2017-05-16 NOTE — Progress Notes (Signed)
This is the Subsequent Medicare Annual Wellness Exam, performed 12 months or more after the Initial AWV or the last Subsequent AWV    I have reviewed the patient's medical history in detail and updated the computerized patient record.     History     Past Medical History:   Diagnosis Date   ??? Arthritis    ??? BPH (benign prostatic hyperplasia)    ??? Cervical disc disease    ??? DVT (deep venous thrombosis) (HCC) x 2 2009  and 2011     after knee surgery then after long car ride    ??? ED (erectile dysfunction)    ??? Hypercoagulopathy (HCC)    ??? Hypertension    ??? 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 Sleep apnea     CPAP; no longer using cpap   ??? Thromboembolus [facility](HCC)       Past Surgical History:   Procedure Laterality Date   ??? HX BACK SURGERY  03/2016    Lumbar "Drill holes and cleaned it out".   ??? HX CATARACT REMOVAL Bilateral    ??? HX ENDOSCOPY      colonoscopy last was in 2008    ??? 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 HX KNEE REPLACEMENT Right      Current Outpatient Medications   Medication Sig Dispense Refill   ??? lisinopril (PRINIVIL, ZESTRIL) 10 mg tablet Take 1 Tab by mouth daily. 30 Tab 5   ??? amoxicillin-clavulanate (AUGMENTIN) 875-125 mg per tablet Take 1 Tab by mouth every twelve (12) hours for 10 days. 20 Tab 0   ??? predniSONE (DELTASONE) 20 mg tablet 20 mg BID for 6 days then 20 mg daily for 4 days. 16 Tab 0   ??? glucosamine HCl/chondroitin su (GLUCOSAMINE-CHONDROITIN PO) Take  by mouth.     ??? warfarin (COUMADIN) 1 mg tablet As directed 180 Tab 0   ??? warfarin (COUMADIN) 5 mg tablet TAKE AS DIRECTED 90 Tab 0   ??? diclofenac EC (VOLTAREN) 75 mg EC tablet Take 75 mg by mouth two (2) times a day.     ??? finasteride (PROSCAR) 5 mg tablet Take 1 Tab by mouth daily. 90 Tab 3   ??? hydrocortisone valerate (WESTCORT) 0.2 % topical cream Apply  to affected area two (2) times a day. use thin layer     ??? testosterone 75 mg pllt by Implant route.     ??? multivitamin (ONE A DAY) tablet Take 0.5 Tabs by mouth two (2) times a  day.       No Known Allergies  Family History   Problem Relation Age of Onset   ??? Arthritis-osteo Mother    ??? Hypertension Mother    ??? Arthritis-osteo Father    ??? Arthritis-osteo Brother    ??? Cancer Paternal Uncle         unknown   ??? Cancer Paternal Uncle         stomach   ??? Diabetes Paternal Uncle      Social History     Tobacco Use   ??? Smoking status: Former Smoker     Packs/day: 0.50     Years: 20.00     Pack years: 10.00     Types: Cigarettes     Last attempt to quit: 11/30/1984     Years since quitting: 32.4   ??? Smokeless tobacco: Never Used   Substance Use Topics   ??? Alcohol use: No  Patient Active Problem List   Diagnosis Code   ??? 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 Anticoagulated on Coumadin Z51.81, Z79.01   ??? Dyslipidemia E78.5   ??? Erectile dysfunction N52.9   ??? Incomplete bladder emptying R33.9   ??? 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 Hypercoagulopathy (HCC) D68.59   ??? Osteoarthritis of right knee M17.11   ??? 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 Hypertension I10   ??? Right inguinal hernia K40.90   ??? 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 Acute non-recurrent sinusitis J01.90       Depression Risk Factor Screening:     PHQ over the last two weeks 05/12/2017   Little interest or pleasure in doing things Not at all   Feeling down, depressed, irritable, or hopeless Not at all   Total Score PHQ 2 0     Alcohol Risk Factor Screening:   You do not drink alcohol or very rarely.    Functional Ability and Level of Safety:   Hearing Loss  Hearing is good.    Activities of Daily Living  The home contains: no safety equipment.  Patient does total self care    Fall Risk  Fall Risk Assessment, last 12 mths 05/12/2017   Able to walk? Yes   Fall in past 12 months? No   Fall with injury? -    Number of falls in past 12 months -   Fall Risk Score -       Abuse Screen  Patient is not abused    Cognitive Screening   Evaluation of Cognitive Function:  Has your family/caregiver stated any concerns about your memory: no  Normal, Clock Drawing test, Mini Cog test    Patient Care Team   Patient Care Team:  Elmer SowHunter, Niani Mourer Whitney, MD as PCP - General (Family Practice)  Defade, Art BuffBrian P, DO (Urology)    Assessment/Plan   Education and counseling provided:  Are appropriate based on today's review and evaluation    Diagnoses and all orders for this visit:    Medicare annual wellness visit, subsequent

## 2017-05-16 NOTE — Patient Instructions (Addendum)
[facility] working hours are Monday - Friday 8:00AM-6:00PM.  First and Third Monday are 8:00AM-7:30PM.  Office phone number is [phone] and fax is [phone].    For non-emergent medical care and clinical advice during office hours:   1. Call office or   2.?? Send message or request using MyChart  For non-emergent medical care and clinical advice after office hours:  1.?? Send message or request using MyChart   2.?? Call [phone] and leave a message with our answering service or  ??  Emergency care can be obtained at the [facility] ER, Urgent Care or by calling 911.  ??  Patient Satisfaction Survey  As a valued patient, you will be receiving a survey from [employer].  We encourage you to share your thoughts and opinions about the care you received today. Thank you for choosing [facility]  ????  All medications ordered during your visit were transmitted electronically to the pharmacy unless you were given a printed prescription. We will notify you of results of laboratory or pathology testing done today as soon as they are available.  Please get any lab work or radiology testing ordered at this appointment as directed or prior to your next appointment. These can be done at [facility] or the [employer] center in the [employer].   Centralized scheduling will contact your regarding any testing ordered at this appointment to schedule and/or the Centralized Referral Center will contact you regarding any referrals which have been made. If you have any questions regarding these appointments, call the office at [phone].   ??  BRING ALL MEDICATION BOTTLES TO EVERY APPOINTMENT  ??  Medication Refills -   * Please do not let your medication run out before calling for a refill, the best practice would be to call when you have 2 weeks of medication left.  * Please allow 48 hours for medications to be called into your pharmacy  when you request a medication refill    ??  Have MyChart? Use it to request medication refills, it's quick and easy! If you have questions about MyChart please feel free to ask our staff!  Medicare Wellness Visit, Male    The best way to live healthy is to have a lifestyle where you eat a well-balanced diet, exercise regularly, limit alcohol use, and quit all forms of tobacco/nicotine, if applicable.   Regular preventive services are another way to keep healthy. Preventive services (vaccines, screening tests, monitoring & exams) can help personalize your care plan, which helps you manage your own care. Screening tests can find health problems at the earliest stages, when they are easiest to treat.   [facility] follows the current, evidence-based guidelines published by the [place] [employer] (USPSTF) when recommending preventive services for our patients. Because we follow these guidelines, sometimes recommendations change over time as research supports it. (For example, a prostate screening blood test is no longer routinely recommended for men with no symptoms.)  Of course, you and your doctor may decide to screen more often for some diseases, based on your risk and co-morbidities (chronic disease you are already diagnosed with).   Preventive services for you include:  - Medicare offers their members a free annual wellness visit, which is time for you and your primary care provider to discuss and plan for your preventive service needs. Take advantage of this benefit every year!  -All adults over age 28 should  receive the recommended pneumonia vaccines. Current USPSTF guidelines recommend a series of two vaccines for the best pneumonia protection.   -All adults should have a flu vaccine yearly and an ECG. All adults age 70 and older should receive a shingles vaccine once in their lifetime.    -All adults age 70-70 who are overweight should have a diabetes screening  test once every three years.   -Other screening tests & preventive services for persons with diabetes include: an eye exam to screen for diabetic retinopathy, a kidney function test, a foot exam, and stricter control over your cholesterol.   -Cardiovascular screening for adults with routine risk involves an electrocardiogram (ECG) at intervals determined by the provider.   -Colorectal cancer screening should be done for adults age 70-75 with no increased risk factors for colorectal cancer.  There are a number of acceptable methods of screening for this type of cancer. Each test has its own benefits and drawbacks. Discuss with your provider what is most appropriate for you during your annual wellness visit. The different tests include: colonoscopy (considered the best screening method), a fecal occult blood test, a fecal DNA test, and sigmoidoscopy.  -All adults born between 1945 and 1965 should be screened once for Hepatitis C.  -An Abdominal Aortic Aneurysm (AAA) Screening is recommended for men age 70-75 who has ever smoked in their lifetime.     Here is a list of your current Health Maintenance items (your personalized list of preventive services) with a due date:  Health Maintenance Due   Topic Date Due   ??? Shingles Vaccine (1 of 2) 01/15/1997   ??? Glaucoma Screening   09/11/2016   ??? Annual Well Visit  11/26/2016   ??? Colonoscopy  10/17/2017

## 2017-05-16 NOTE — Progress Notes (Signed)
[facility]  Dr. Elmer Sow MD  [address]  [place], [place] [address]  [phone]      Name:  Julian Bailey  D.O.B:  Feb 16, 1946  Age: 71 y.o.  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 male. Here for follow up on multiple medical problems:    Sinus Infection  - seen in clinic on 05/12/17 for sinus infection  - currently taking Augmentin and Prednisone  -reports improvement in symptoms with these medications  - no fever, chills, sore throat, sinus congestion, or SOB     HTN  -chronic  - BP had been running high at home 150s/90s with HA, had been on Lisinopril in the past so this was resumed at appointment on 11/16- he reports that when he takes 10mg  daily his BP remains elevated however when he takes 20mg  his BP ranges 120-130/70-80. Would like to continue Lisinopril at this dose and frequency (10mg  BID )  - no CP, swelling, cough, HA   -BP today: 124/80mmHg  - will continue to monitor at home     Hx of Multiple DVT's  - chronic  - compliant with Coumadin  - currently takes 5mg  daily and monitors INR at home once every two weeks, range 2.2-2.8.   - denies any episodes of bleeding, nose bleeds, gum bleeding, hematuria, blood in stool or dark colored stool.   - also takes Voltaren PO PRN for Arthritis- understands risks associated with this medication and its simultaneous use with Warfarin       Current Medications  Current Outpatient Medications   Medication Sig Dispense Refill   ??? lisinopril (PRINIVIL, ZESTRIL) 10 mg tablet Take 1 Tab by mouth daily. 30 Tab 5   ??? amoxicillin-clavulanate (AUGMENTIN) 875-125 mg per tablet Take 1 Tab by mouth every twelve (12) hours for 10 days. 20 Tab 0   ??? predniSONE (DELTASONE) 20 mg tablet 20 mg BID for 6 days then 20 mg daily for 4 days. 16 Tab 0    ??? glucosamine HCl/chondroitin su (GLUCOSAMINE-CHONDROITIN PO) Take  by mouth.     ??? warfarin (COUMADIN) 1 mg tablet As directed 180 Tab 0   ??? warfarin (COUMADIN) 5 mg tablet TAKE AS DIRECTED 90 Tab 0   ??? diclofenac EC (VOLTAREN) 75 mg EC tablet Take 75 mg by mouth two (2) times a day.     ??? finasteride (PROSCAR) 5 mg tablet Take 1 Tab by mouth daily. 90 Tab 3   ??? hydrocortisone valerate (WESTCORT) 0.2 % topical cream Apply  to affected area two (2) times a day. use thin layer     ??? testosterone 75 mg pllt by Implant route.     ??? multivitamin (ONE A DAY) tablet Take 0.5 Tabs by mouth two (2) times a day.       Allergies  No Known Allergies    Past Medical History  Past Medical History:   Diagnosis Date   ??? Arthritis    ??? BPH (benign prostatic hyperplasia)    ??? Cervical disc disease    ??? DVT (deep venous thrombosis) (HCC) x 2 2009  and 2011     after knee surgery then after long car ride    ??? ED (erectile dysfunction)    ??? Hypercoagulopathy (  HCC)    ??? Hypertension    ??? 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 Sleep apnea     CPAP; no longer using cpap   ??? 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 HX BACK SURGERY  03/2016    Lumbar "Drill holes and cleaned it out".   ??? HX CATARACT REMOVAL Bilateral    ??? HX ENDOSCOPY      colonoscopy last was in 2008    ??? 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 HX KNEE REPLACEMENT Right        Social History  Social History     Socioeconomic History   ??? Marital status: MARRIED     Spouse name: Not on file   ??? Number of children: Not on file   ??? Years of education: Not on file   ??? Highest education level: Not on file   Social Needs   ??? Financial resource strain: Not on file   ??? Food insecurity - worry: Not on file   ??? Food insecurity - inability: Not on file   ??? Transportation needs - medical: Not on file   ??? Transportation needs - non-medical: Not on file   Occupational History   ??? Not on file   Tobacco Use    ??? Smoking status: Former Smoker     Packs/day: 0.50     Years: 20.00     Pack years: 10.00     Types: Cigarettes     Last attempt to quit: 11/30/1984     Years since quitting: 32.4   ??? Smokeless tobacco: Never Used   Substance and Sexual Activity   ??? Alcohol use: No   ??? Drug use: No   ??? Sexual activity: Yes     Partners: Female   Other Topics Concern   ??? Military Service Not Asked   ??? Blood Transfusions No   ??? Caffeine Concern Not Asked   ??? Occupational Exposure Not Asked   ??? Hobby Hazards Not Asked   ??? Sleep Concern Not Asked   ??? Stress Concern Not Asked   ??? Weight Concern Not Asked   ??? Special Diet Not Asked   ??? Back Care Not Asked   ??? Exercise Not Asked   ??? Bike Helmet Not Asked   ??? Seat Belt Not Asked   ??? Self-Exams Not Asked   Social History Narrative    Medicare wellness 09/23/13          Family History  Family History   Problem Relation Age of Onset   ??? Arthritis-osteo Mother    ??? Hypertension Mother    ??? Arthritis-osteo Father    ??? Arthritis-osteo Brother    ??? Cancer Paternal Uncle         unknown   ??? Cancer Paternal Uncle         stomach   ??? Diabetes Paternal Uncle              Review of Systems  10 system ROS completed and found to be negative except for those mentioned in HPI       Visit Vitals  BP 124/76   Pulse 92   Temp 98.5 ??F (36.9 ??C) (Oral)   Resp 20   Ht 5\' 10"  (1.778 m)   Wt 211 lb 6 oz (95.9 kg)   SpO2 98%   BMI  30.33 kg/m??       Physical Exam   Constitutional: He is oriented to person, place, and time. He appears well-developed and well-nourished.   HENT:   Head: Normocephalic and atraumatic.   Right Ear: External ear normal.   Left Ear: External ear normal.   Nose: No mucosal edema or rhinorrhea. Right sinus exhibits no frontal sinus tenderness. Left sinus exhibits no frontal sinus tenderness.   Mouth/Throat: Mucous membranes are normal. No oropharyngeal exudate or tonsillar abscesses.   Eyes: EOM are normal. Pupils are equal, round, and reactive to light.    Neck: Normal range of motion. Neck supple.   Cardiovascular: Normal rate. Exam reveals no gallop and no friction rub.   No murmur heard.  Pulmonary/Chest: Effort normal and breath sounds normal.   Abdominal: Soft. Bowel sounds are normal. He exhibits no distension. There is no tenderness.   Musculoskeletal: Normal range of motion.   Lymphadenopathy:     He has no cervical adenopathy.   Neurological: He is alert and oriented to person, place, and time.   Skin: Skin is warm and dry. No rash noted.   Psychiatric: He has a normal mood and affect.   Vitals reviewed.        Assessment/Plan:    Diagnoses and all orders for this visit:     Essential hypertension  - reviewed with patient that Lisinopril may be taken as a once daily dose- he wishes to continue BID   -     lisinopril (PRINIVIL, ZESTRIL) 10 mg tablet; Take 1 Tab by mouth two (2) times a day.  - Discussed risks, benefits, potential side effects, and alternative therapies with the patient regarding this  medication. Patient verbalized understanding. ER Precautions reviewed.    -continue to monitor BP at home  - ER/RTC precautions reviewed  -will need CMP and FLP at follow up visit     Sinusitis, unspecified chronicity, unspecified location  - symptoms improved   -continue current medications     Hypercoagulopathy (HCC)  - hx of multiple DVT's  - continue to monitor Coumadin q2weeks, INR has been stable  -compliant with Coumadin diet  - no bleeding episodes  - continue Coumadin 5mg  every day   -ER precautions reviewed         Elevated BMI reviewed, counseled patient on the importance of healthy diet, regular exercise, and weight loss      Follow-up Disposition:  Return in about 5 months (around 10/14/2017).

## 2017-06-05 ENCOUNTER — Encounter: Attending: Family Medicine | Primary: Family Medicine

## 2017-08-07 MED ORDER — FINASTERIDE 5 MG TAB
5 mg | ORAL_TABLET | Freq: Every day | ORAL | 3 refills | Status: DC
Start: 2017-08-07 — End: 2018-08-07

## 2017-08-08 NOTE — Telephone Encounter (Signed)
[facility] please [facility]

## 2017-08-08 NOTE — Telephone Encounter (Signed)
This was sent to [place] pharmacy in [place].

## 2017-08-16 ENCOUNTER — Encounter

## 2017-08-16 MED ORDER — WARFARIN 1 MG TAB
1 mg | ORAL_TABLET | ORAL | 0 refills | Status: DC
Start: 2017-08-16 — End: 2017-12-18

## 2017-08-16 MED ORDER — WARFARIN 5 MG TAB
5 mg | ORAL_TABLET | ORAL | 0 refills | Status: DC
Start: 2017-08-16 — End: 2017-12-18

## 2017-10-26 ENCOUNTER — Encounter: Attending: Urology | Primary: Family Medicine

## 2017-11-13 ENCOUNTER — Encounter: Attending: Family Medicine | Primary: Family Medicine

## 2017-11-17 ENCOUNTER — Encounter: Attending: Physician Assistant | Primary: Family Medicine

## 2017-12-18 ENCOUNTER — Encounter

## 2017-12-18 MED ORDER — WARFARIN 5 MG TAB
5 mg | ORAL_TABLET | ORAL | 0 refills | Status: DC
Start: 2017-12-18 — End: 2017-12-19

## 2017-12-18 MED ORDER — WARFARIN 1 MG TAB
1 mg | ORAL_TABLET | ORAL | 0 refills | Status: DC
Start: 2017-12-18 — End: 2018-02-15

## 2017-12-19 MED ORDER — WARFARIN 5 MG TAB
5 mg | ORAL_TABLET | ORAL | 0 refills | Status: DC
Start: 2017-12-19 — End: 2018-02-15

## 2018-01-22 ENCOUNTER — Encounter: Attending: Family Medicine | Primary: Family Medicine

## 2018-01-31 ENCOUNTER — Ambulatory Visit: Admit: 2018-01-31 | Discharge: 2018-01-31 | Payer: MEDICARE | Attending: Family Medicine | Primary: Family Medicine

## 2018-01-31 ENCOUNTER — Ambulatory Visit: Attending: Family Medicine

## 2018-01-31 DIAGNOSIS — D6859 Other primary thrombophilia: Secondary | ICD-10-CM

## 2018-01-31 NOTE — Progress Notes (Signed)
[facility]  Dr. Elmer Sow MD  [address]  [place], [place] [address]  [phone]      Name:  Julian Bailey  D.O.B:  Nov 20, 1945  Age: 72 y.o.  PCP: Elmer Sow, MD      Encounter Date:  01/31/2018    CHIEF COMPLAINT:  Follow-up hypertension, history of multiple DVTs    HISTORY OF PRESENT ILLNESS  Julian Bailey is a 72 y.o. male.  HTN  -chronic  ???Blood pressure today 128/82  ??? Plan with medication  ???Monitors at home, no episodes of hypo-or hypertension  - no CP, swelling, cough, HA   ??  Hx of Multiple DVT's  - chronic  - compliant with Coumadin  - currently takes 7mg  daily and monitors INR at home once every two weeks, range 2.5-3  - denies any episodes of bleeding, nose bleeds, gum bleeding, hematuria, blood in stool or dark colored stool.   - also takes Voltaren PO PRN for Arthritis- understands risks associated with this medication and its simultaneous use with Warfarin           Current Medications  Current Outpatient Medications   Medication Sig Dispense Refill   ??? triamcinolone acetonide (KENALOG) 0.1 % topical cream Apply 1 Each to affected area as needed.  3   ??? gabapentin (NEURONTIN) 300 mg capsule Take 300 mg by mouth as needed.     ??? warfarin (COUMADIN) 5 mg tablet TAKE AS DIRECTED 90 Tab 0   ??? warfarin (COUMADIN) 1 mg tablet As directed 180 Tab 0   ??? finasteride (PROSCAR) 5 mg tablet Take 1 Tab by mouth daily. 90 Tab 3   ??? lisinopril (PRINIVIL, ZESTRIL) 10 mg tablet Take 1 Tab by mouth two (2) times a day. 180 Tab 5   ??? glucosamine HCl/chondroitin su (GLUCOSAMINE-CHONDROITIN PO) Take  by mouth.     ??? diclofenac EC (VOLTAREN) 75 mg EC tablet Take 75 mg by mouth two (2) times a day.     ??? testosterone 75 mg pllt by Implant route.     ??? multivitamin (ONE A DAY) tablet Take 0.5 Tabs by mouth two (2) times a day.         Allergies  No Known Allergies    Past Medical History  Past Medical History:   Diagnosis Date   ??? Arthritis    ??? BPH (benign  prostatic hyperplasia)    ??? Cervical disc disease    ??? Chronic pain    ??? DVT (deep venous thrombosis) (HCC) x 2 2009  and 2011     after knee surgery then after long car ride    ??? ED (erectile dysfunction)    ??? Hypercoagulopathy (HCC)    ??? Hypertension    ??? 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 Sleep apnea     CPAP; no longer using cpap   ??? Thromboembolus [facility])        Past Surgical History  Past Surgical History:   Procedure Laterality Date   ??? HX BACK SURGERY  03/2016    Lumbar "Drill holes and cleaned it out".   ??? HX CATARACT REMOVAL Bilateral    ??? HX ENDOSCOPY      colonoscopy last was in 2008    ??? 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 HX KNEE REPLACEMENT Right        Social History  Social History     Socioeconomic History   ???  Marital status: MARRIED     Spouse name: Not on file   ??? Number of children: Not on file   ??? Years of education: Not on file   ??? Highest education level: Not on file   Tobacco Use   ??? Smoking status: Former Smoker     Packs/day: 0.50     Years: 20.00     Pack years: 10.00     Types: Cigarettes     Last attempt to quit: 11/30/1984     Years since quitting: 33.1   ??? Smokeless tobacco: Never Used   Substance and Sexual Activity   ??? Alcohol use: No   ??? Drug use: No   ??? Sexual activity: Yes     Partners: Female   Other Topics Concern   ??? Blood Transfusions No   Social History Narrative    Medicare wellness 09/23/13          Family History  Family History   Problem Relation Age of Onset   ??? Arthritis-osteo Mother    ??? Hypertension Mother    ??? Arthritis-osteo Father    ??? Arthritis-osteo Brother    ??? Cancer Paternal Uncle         unknown   ??? Cancer Paternal Uncle         stomach   ??? Diabetes Paternal Uncle              Review of Systems  10 system ROS completed and found to be negative except for those mentioned in HPI       Visit Vitals  BP 128/82 (BP 1 Location: Left arm, BP Patient Position: Sitting)   Temp 98.1 ??F (36.7 ??C) (Oral)   Ht 5\' 10"  (1.778 m)   Wt 206 lb 9.6 oz (93.7 kg)   BMI  29.64 kg/m??       Physical Exam   Constitutional: He is oriented to person, place, and time. He appears well-developed and well-nourished.   HENT:   Head: Normocephalic and atraumatic.   Nose: No mucosal edema or rhinorrhea. Right sinus exhibits no frontal sinus tenderness. Left sinus exhibits no frontal sinus tenderness.   Mouth/Throat: Mucous membranes are normal. No oropharyngeal exudate or tonsillar abscesses.   Eyes: Pupils are equal, round, and reactive to light. EOM are normal.   Neck: Normal range of motion. Neck supple. No thyromegaly present.   Cardiovascular: Normal rate and regular rhythm. Exam reveals no gallop and no friction rub.   No murmur heard.  Pulmonary/Chest: Effort normal and breath sounds normal. He has no wheezes.   Abdominal: Soft. Bowel sounds are normal. He exhibits no distension. There is no tenderness.   Neurological: He is alert and oriented to person, place, and time.   Skin: Skin is warm and dry. No rash noted.   Psychiatric: He has a normal mood and affect.   Vitals reviewed.      Assessment/Plan:    Diagnoses and all orders for this visit:    1. Hypercoagulopathy (HCC)  ???Continue current Coumadin dosing, INR goal between 2 and 3, patient will continue to monitor at home, counseled on the importance of Coumadin diet  ???ER/RTC precautions reviewed  2. Essential hypertension  ???Stable, continue current medication    Elevated BMI reviewed, counseled patient on the importance of healthy diet, regular exercise, and weight loss    Follow-up and Dispositions    ?? Return for Patient will call for Medicare wellness appointment .

## 2018-01-31 NOTE — Progress Notes (Signed)
[facility]  Dr. Elmer Sow MD  [address]  [place], [place] [address]  [phone]      Name:  Julian Bailey  D.O.B:  Nov 13, 1945  Age: 72 y.o.  PCP: Elmer Sow, MD      Encounter Date:  01/31/2018    CHIEF COMPLAINT:  Follow-up hypertension, history of multiple DVTs    HISTORY OF PRESENT ILLNESS  Kurt Hoffmeier is a 72 y.o. male.  HTN  -chronic  ???Blood pressure today 128/82  ??? Plan with medication  ???Monitors at home, no episodes of hypo-or hypertension  - no CP, swelling, cough, HA   ??  Hx of Multiple DVT's  - chronic  - compliant with Coumadin  - currently takes 7mg  daily and monitors INR at home once every two weeks, range 2.5-3  - denies any episodes of bleeding, nose bleeds, gum bleeding, hematuria, blood in stool or dark colored stool.   - also takes Voltaren PO PRN for Arthritis- understands risks associated with this medication and its simultaneous use with Warfarin           Current Medications  Current Outpatient Medications   Medication Sig Dispense Refill   ??? triamcinolone acetonide (KENALOG) 0.1 % topical cream Apply 1 Each to affected area as needed.  3   ??? gabapentin (NEURONTIN) 300 mg capsule Take 300 mg by mouth as needed.     ??? warfarin (COUMADIN) 5 mg tablet TAKE AS DIRECTED 90 Tab 0   ??? warfarin (COUMADIN) 1 mg tablet As directed 180 Tab 0   ??? finasteride (PROSCAR) 5 mg tablet Take 1 Tab by mouth daily. 90 Tab 3   ??? lisinopril (PRINIVIL, ZESTRIL) 10 mg tablet Take 1 Tab by mouth two (2) times a day. 180 Tab 5   ??? glucosamine HCl/chondroitin su (GLUCOSAMINE-CHONDROITIN PO) Take  by mouth.     ??? diclofenac EC (VOLTAREN) 75 mg EC tablet Take 75 mg by mouth two (2) times a day.     ??? testosterone 75 mg pllt by Implant route.     ??? multivitamin (ONE A DAY) tablet Take 0.5 Tabs by mouth two (2) times a day.         Allergies  No Known Allergies    Past Medical History  Past Medical History:   Diagnosis Date   ??? Arthritis     ??? BPH (benign prostatic hyperplasia)    ??? Cervical disc disease    ??? Chronic pain    ??? DVT (deep venous thrombosis) (HCC) x 2 2009  and 2011     after knee surgery then after long car ride    ??? ED (erectile dysfunction)    ??? Hypercoagulopathy (HCC)    ??? Hypertension    ??? 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 Sleep apnea     CPAP; no longer using cpap   ??? Thromboembolus [facility])        Past Surgical History  Past Surgical History:   Procedure Laterality Date   ??? HX BACK SURGERY  03/2016    Lumbar "Drill holes and cleaned it out".   ??? HX CATARACT REMOVAL Bilateral    ??? HX ENDOSCOPY      colonoscopy last was in 2008    ??? 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 HX KNEE REPLACEMENT Right        Social History  Social History     Socioeconomic History   ???  Marital status: MARRIED     Spouse name: Not on file   ??? Number of children: Not on file   ??? Years of education: Not on file   ??? Highest education level: Not on file   Tobacco Use   ??? Smoking status: Former Smoker     Packs/day: 0.50     Years: 20.00     Pack years: 10.00     Types: Cigarettes     Last attempt to quit: 11/30/1984     Years since quitting: 33.1   ??? Smokeless tobacco: Never Used   Substance and Sexual Activity   ??? Alcohol use: No   ??? Drug use: No   ??? Sexual activity: Yes     Partners: Female   Other Topics Concern   ??? Blood Transfusions No   Social History Narrative    Medicare wellness 09/23/13          Family History  Family History   Problem Relation Age of Onset   ??? Arthritis-osteo Mother    ??? Hypertension Mother    ??? Arthritis-osteo Father    ??? Arthritis-osteo Brother    ??? Cancer Paternal Uncle         unknown   ??? Cancer Paternal Uncle         stomach   ??? Diabetes Paternal Uncle              Review of Systems  10 system ROS completed and found to be negative except for those mentioned in HPI       Visit Vitals  BP 128/82 (BP 1 Location: Left arm, BP Patient Position: Sitting)   Temp 98.1 ??F (36.7 ??C) (Oral)   Ht 5\' 10"  (1.778 m)    Wt 206 lb 9.6 oz (93.7 kg)   BMI 29.64 kg/m??       Physical Exam   Constitutional: He is oriented to person, place, and time. He appears well-developed and well-nourished.   HENT:   Head: Normocephalic and atraumatic.   Nose: No mucosal edema or rhinorrhea. Right sinus exhibits no frontal sinus tenderness. Left sinus exhibits no frontal sinus tenderness.   Mouth/Throat: Mucous membranes are normal. No oropharyngeal exudate or tonsillar abscesses.   Eyes: Pupils are equal, round, and reactive to light. EOM are normal.   Neck: Normal range of motion. Neck supple. No thyromegaly present.   Cardiovascular: Normal rate and regular rhythm. Exam reveals no gallop and no friction rub.   No murmur heard.  Pulmonary/Chest: Effort normal and breath sounds normal. He has no wheezes.   Abdominal: Soft. Bowel sounds are normal. He exhibits no distension. There is no tenderness.   Neurological: He is alert and oriented to person, place, and time.   Skin: Skin is warm and dry. No rash noted.   Psychiatric: He has a normal mood and affect.   Vitals reviewed.      Assessment/Plan:    Diagnoses and all orders for this visit:    1. Hypercoagulopathy (HCC)  ???Continue current Coumadin dosing, INR goal between 2 and 3, patient will continue to monitor at home, counseled on the importance of Coumadin diet  ???ER/RTC precautions reviewed  2. Essential hypertension  ???Stable, continue current medication    Elevated BMI reviewed, counseled patient on the importance of healthy diet, regular exercise, and weight loss    Follow-up and Dispositions    ?? Return for Patient will call for Medicare wellness appointment .

## 2018-01-31 NOTE — Patient Instructions (Signed)
[facility]    Office working hours are Monday - Friday 8:00AM-6:00PM.  First and Third Monday are 8:00AM-7:30PM.  Office phone number is [phone] and fax is [phone].    For non-emergent medical care and clinical advice during office hours:   1. Call office or   2.?? Send message or request using MyChart  For non-emergent medical care and clinical advice after office hours:  1.?? Send message or request using MyChart   2.?? Call [phone] and leave a message with our answering service or  ??  Emergency care can be obtained at the [facility] ER, Urgent Care or by calling 911.  ??  Patient Satisfaction Survey  As a valued patient, you will be receiving a survey from [employer].  We encourage you to share your thoughts and opinions about the care you received today. Thank you for choosing [facility]  ????  All medications ordered during your visit were transmitted electronically to the pharmacy unless you were given a printed prescription. We will notify you of results of laboratory or pathology testing done today as soon as they are available.  Please get any lab work or radiology testing ordered at this appointment as directed or prior to your next appointment. These can be done at [facility] or the [facility] in the [facility].   Centralized scheduling will contact your regarding any testing ordered at this appointment to schedule and/or the Centralized Referral Center will contact you regarding any referrals which have been made. If you have any questions regarding these appointments, call the office at [phone].   ??  BRING ALL MEDICATION BOTTLES TO EVERY APPOINTMENT  ??  Medication Refills -   * Please do not let your medication run out before calling for a refill, the best practice would be to call when you have 2 weeks of medication left.  * Please allow 48 hours for medications to be called into your pharmacy  when you request a medication refill    ??  Have MyChart? Use it to request medication refills, it's quick and easy! If you have questions about MyChart please feel free to ask our staff!

## 2018-02-15 ENCOUNTER — Encounter

## 2018-02-15 MED ORDER — WARFARIN 1 MG TAB
1 mg | ORAL_TABLET | ORAL | 0 refills | Status: DC
Start: 2018-02-15 — End: 2018-05-23

## 2018-02-15 MED ORDER — WARFARIN 5 MG TAB
5 mg | ORAL_TABLET | ORAL | 0 refills | Status: DC
Start: 2018-02-15 — End: 2018-05-23

## 2018-02-15 NOTE — Telephone Encounter (Signed)
Contacted patient who states his INR was 3.0 on 02-09-18. Patient states he test his INR on fridays at home. Patient's INR is to be between 2.5-3 as per Dr. Durene CalHunter. Patient's INR is within range. Medication refilled and sent to [facility] in [place] as per patient's request

## 2018-02-16 ENCOUNTER — Encounter: Attending: Physician Assistant | Primary: Family Medicine

## 2018-02-23 ENCOUNTER — Encounter: Attending: Physician Assistant | Primary: Family Medicine

## 2018-02-23 ENCOUNTER — Encounter: Attending: Family Medicine | Primary: Family Medicine

## 2018-05-23 ENCOUNTER — Ambulatory Visit: Admit: 2018-05-23 | Discharge: 2018-05-23 | Payer: MEDICARE | Attending: Family Medicine | Primary: Family Medicine

## 2018-05-23 ENCOUNTER — Inpatient Hospital Stay: Admit: 2018-05-23 | Payer: MEDICARE | Primary: Family Medicine

## 2018-05-23 ENCOUNTER — Ambulatory Visit: Attending: Family Medicine

## 2018-05-23 DIAGNOSIS — Z0000 Encounter for general adult medical examination without abnormal findings: Secondary | ICD-10-CM

## 2018-05-23 LAB — COMPREHENSIVE METABOLIC PANEL
ALT: 33 U/L (ref 12–78)
AST: 27 U/L (ref 15–37)
Albumin/Globulin Ratio: 1.3 (ref 1.2–2.2)
Albumin: 4 g/dL (ref 3.4–5.0)
Alkaline Phosphatase: 97 U/L (ref 45–117)
Anion Gap: 8 mmol/L (ref 6–15)
BUN: 15 MG/DL (ref 7–18)
Bun/Cre Ratio: 15 (ref 7–25)
CO2: 25 mmol/L (ref 21–32)
Calcium: 9.4 MG/DL (ref 8.5–10.1)
Chloride: 107 mmol/L (ref 98–107)
Creatinine: 1 MG/DL (ref 0.60–1.30)
EGFR IF NonAfrican American: >60 mL/min/{1.73_m2} (ref >60)
GFR African American: >60 mL/min/{1.73_m2} (ref >60)
Globulin: 3 g/dL (ref 2.4–3.5)
Glucose: 93 mg/dL (ref 70–110)
Potassium: 4.3 mmol/L (ref 3.5–5.3)
Sodium: 140 mmol/L (ref 136–145)
Total Bilirubin: 1.6 MG/DL — ABNORMAL HIGH (ref <1.1)
Total Protein: 7 g/dL (ref 6.4–8.2)

## 2018-05-23 LAB — CBC WITH AUTO DIFFERENTIAL
Basophils %: 1 % (ref 0–2)
Basophils Absolute: 0 10*3/uL (ref 0.0–0.1)
Eosinophils %: 0 % (ref 0–5)
Eosinophils Absolute: 0 10*3/uL (ref 0.0–0.5)
Granulocyte Absolute Count: 0 10*3/uL
Hematocrit: 49.7 % (ref 41–53)
Hemoglobin: 17.3 g/dL (ref 13.5–17.5)
Immature Granulocytes: 0 % — ABNORMAL LOW (ref 2–10)
Lymphocytes %: 14 % — ABNORMAL LOW (ref 19–48)
Lymphocytes Absolute: 0.7 10*3/uL — ABNORMAL LOW (ref 0.8–3.5)
MCH: 33.2 PG — ABNORMAL HIGH (ref 27–31)
MCHC: 34.8 g/dL (ref 31–37)
MCV: 95.4 FL (ref 80–100)
MPV: 10.4 FL — ABNORMAL HIGH (ref 5.9–10.3)
Monocytes %: 11 % — ABNORMAL HIGH (ref 3–9)
Monocytes Absolute: 0.5 10*3/uL — ABNORMAL LOW (ref 0.8–3.5)
Neutrophils %: 74 % (ref 40–74)
Neutrophils Absolute: 3.7 10*3/uL (ref 1.5–8.0)
Platelets: 114 10*3/uL — ABNORMAL LOW (ref 130–400)
RBC: 5.21 M/uL (ref 4.7–6.1)
RDW: 13.8 % (ref 11.5–14.5)
WBC: 5 10*3/uL (ref 4.5–10.8)

## 2018-05-23 LAB — URINALYSIS WITH REFLEX TO CULTURE
Bilirubin, Urine: NEGATIVE
Blood, Urine: NEGATIVE
Glucose, Ur: NEGATIVE mg/dL
Ketones, Urine: NEGATIVE mg/dL
Leukocyte Esterase, Urine: NEGATIVE
Nitrite, Urine: NEGATIVE
Protein, UA: NEGATIVE mg/dL
Specific Gravity, UA: 1.015 (ref 1.002–1.030)
Urobilinogen, UA, POCT: 0.2 EU/dL (ref 0–1)
pH, UA: 8.5 — ABNORMAL HIGH (ref 4.5–8.0)

## 2018-05-23 LAB — PSA PROSTATIC SPECIFIC ANTIGEN: Pros. Spec. Antigen: 3.4 ng/mL (ref 0.0–4.0)

## 2018-05-23 LAB — LIPID PANEL
CHOL/HDL Ratio: 3.5 (ref <4.0)
Chol/HDL Ratio: 3.5 (ref <4.0)
Cholesterol, Total: 150 MG/DL (ref <200)
Cholesterol, total: 150 MG/DL (ref <200)
HDL Cholesterol: 43 MG/DL (ref 32–96)
HDL: 43 MG/DL (ref 32–96)
LDL Calculated: 64 MG/DL (ref <130)
LDL, calculated: 64 MG/DL (ref <130)
Triglyceride: 215 MG/DL — ABNORMAL HIGH (ref <150)
Triglycerides: 215 MG/DL — ABNORMAL HIGH (ref <150)
VLDL Cholesterol Calculated: 43 MG/DL — ABNORMAL HIGH (ref 5–32)
VLDL, calculated: 43 MG/DL — ABNORMAL HIGH (ref 5–32)

## 2018-05-23 LAB — TSH 3RD GENERATION
TSH: 1.07 u[IU]/mL (ref 0.35–3.74)
TSH: 1.07 u[IU]/mL (ref 0.35–3.74)

## 2018-05-23 LAB — CBC WITH AUTOMATED DIFF
ABS. BASOPHILS: 0 10*3/uL (ref 0.0–0.1)
ABS. EOSINOPHILS: 0 10*3/uL (ref 0.0–0.5)
ABS. IMM. GRANS.: 0 10*3/uL
ABS. LYMPHOCYTES: 0.7 10*3/uL — ABNORMAL LOW (ref 0.8–3.5)
ABS. MONOCYTES: 0.5 10*3/uL — ABNORMAL LOW (ref 0.8–3.5)
ABS. NEUTROPHILS: 3.7 10*3/uL (ref 1.5–8.0)
BASOPHILS: 1 % (ref 0–2)
EOSINOPHILS: 0 % (ref 0–5)
HCT: 49.7 % (ref 41–53)
HGB: 17.3 g/dL (ref 13.5–17.5)
IMMATURE GRANULOCYTES: 0 % — ABNORMAL LOW (ref 2–10)
LYMPHOCYTES: 14 % — ABNORMAL LOW (ref 19–48)
MCH: 33.2 PG — ABNORMAL HIGH (ref 27–31)
MCHC: 34.8 g/dL (ref 31–37)
MCV: 95.4 FL (ref 80–100)
MONOCYTES: 11 % — ABNORMAL HIGH (ref 3–9)
MPV: 10.4 FL — ABNORMAL HIGH (ref 5.9–10.3)
NEUTROPHILS: 74 % (ref 40–74)
PLATELET: 114 10*3/uL — ABNORMAL LOW (ref 130–400)
RBC: 5.21 M/uL (ref 4.7–6.1)
RDW: 13.8 % (ref 11.5–14.5)
WBC: 5 10*3/uL (ref 4.5–10.8)

## 2018-05-23 LAB — METABOLIC PANEL, COMPREHENSIVE
A-G Ratio: 1.3 (ref 1.2–2.2)
ALT (SGPT): 33 U/L (ref 12–78)
AST (SGOT): 27 U/L (ref 15–37)
Albumin: 4 g/dL (ref 3.4–5.0)
Alk. phosphatase: 97 U/L (ref 45–117)
Anion gap: 8 mmol/L (ref 6–15)
BUN/Creatinine ratio: 15 (ref 7–25)
BUN: 15 MG/DL (ref 7–18)
Bilirubin, total: 1.6 MG/DL — ABNORMAL HIGH (ref <1.1)
CO2: 25 mmol/L (ref 21–32)
Calcium: 9.4 MG/DL (ref 8.5–10.1)
Chloride: 107 mmol/L (ref 98–107)
Creatinine: 1 MG/DL (ref 0.60–1.30)
GFR est AA: >60 mL/min/{1.73_m2} (ref >60)
GFR est non-AA: >60 mL/min/{1.73_m2} (ref >60)
Globulin: 3 g/dL (ref 2.4–3.5)
Glucose: 93 mg/dL (ref 70–110)
Potassium: 4.3 mmol/L (ref 3.5–5.3)
Protein, total: 7 g/dL (ref 6.4–8.2)
Sodium: 140 mmol/L (ref 136–145)

## 2018-05-23 LAB — UA WITH REFLEX MICRO AND CULTURE
Bilirubin: NEGATIVE
Blood: NEGATIVE
Glucose: NEGATIVE mg/dL
Ketone: NEGATIVE mg/dL
Leukocyte Esterase: NEGATIVE
Nitrites: NEGATIVE
Protein: NEGATIVE mg/dL
Specific gravity: 1.015 (ref 1.002–1.030)
Urobilinogen: 0.2 EU/dL (ref 0–1)
pH (UA): 8.5 — ABNORMAL HIGH (ref 4.5–8.0)

## 2018-05-23 LAB — PSA, DIAGNOSTIC (PROSTATE SPECIFIC AG): Prostate Specific Ag: 3.4 ng/mL (ref 0.0–4.0)

## 2018-05-23 MED ORDER — WARFARIN 1 MG TAB
1 mg | ORAL_TABLET | ORAL | 2 refills | Status: DC
Start: 2018-05-23 — End: 2018-08-07

## 2018-05-23 MED ORDER — WARFARIN 5 MG TAB
5 mg | ORAL_TABLET | ORAL | 2 refills | Status: DC
Start: 2018-05-23 — End: 2018-08-07

## 2018-05-23 MED ORDER — VARICELLA-ZOSTER GLYCOE VACC-AS01B ADJ(PF) 50 MCG/0.5 ML IM SUSPENSION
50 mcg/0.5 mL | Freq: Once | INTRAMUSCULAR | 1 refills | Status: AC
Start: 2018-05-23 — End: 2018-05-23

## 2018-05-23 NOTE — Progress Notes (Signed)
No answer. Left voicemail for pt to return call

## 2018-05-23 NOTE — Progress Notes (Signed)
Informed pt. Pt voiced understanding.

## 2018-05-23 NOTE — Progress Notes (Signed)
This is the Subsequent Medicare Annual Wellness Exam, performed 12 months or more after the Initial AWV or the last Subsequent AWV    I have reviewed the patient's medical history in detail and updated the computerized patient record.     History     Patient Active Problem List   Diagnosis Code   ??? 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 Anticoagulated on Coumadin Z79.01   ??? Dyslipidemia E78.5   ??? Erectile dysfunction N52.9   ??? Incomplete bladder emptying R33.9   ??? 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 Hypercoagulopathy (HCC) D68.59   ??? Osteoarthritis of right knee M17.11   ??? 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 Hypertension I10   ??? Right inguinal hernia K40.90   ??? 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 Acute non-recurrent sinusitis J01.90     Past Medical History:   Diagnosis Date   ??? Arthritis    ??? BPH (benign prostatic hyperplasia)    ??? Cervical disc disease    ??? Chronic pain    ??? DVT (deep venous thrombosis) (HCC) x 2 2009  and 2011     after knee surgery then after long car ride    ??? ED (erectile dysfunction)    ??? Hypercoagulopathy (HCC)    ??? Hypertension    ??? 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 Sleep apnea     CPAP; no longer using cpap   ??? Thromboembolus [facility])       Past Surgical History:   Procedure Laterality Date   ??? HX BACK SURGERY  03/2016    Lumbar "Drill holes and cleaned it out".   ??? HX CATARACT REMOVAL Bilateral    ??? HX ENDOSCOPY      colonoscopy last was in 2008    ??? 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 HX KNEE REPLACEMENT Right    ??? HX OTHER SURGICAL  11/2017    Pt states he had a venaseal on both legs     Current Outpatient Medications   Medication Sig Dispense Refill   ??? warfarin (COUMADIN) 1 mg tablet As directed 180 Tab 0   ??? warfarin (COUMADIN) 5 mg tablet  1 tablet by mouth daily 90 Tab 0   ??? triamcinolone acetonide (KENALOG) 0.1 % topical cream Apply 1 Each to affected area as needed.  3   ??? finasteride (PROSCAR) 5 mg tablet Take 1 Tab by mouth daily. 90 Tab 3   ??? lisinopril (PRINIVIL, ZESTRIL) 10 mg tablet Take 1 Tab by mouth two (2) times a day. 180 Tab 5   ??? glucosamine HCl/chondroitin su (GLUCOSAMINE-CHONDROITIN PO) Take  by mouth.     ??? diclofenac EC (VOLTAREN) 75 mg EC tablet Take 75 mg by mouth two (2) times a day.     ??? testosterone 75 mg pllt by Implant route.     ??? multivitamin (ONE A DAY) tablet Take 0.5 Tabs by mouth two (2) times a day.     ??? fluticasone propionate (FLONASE) 50 mcg/actuation nasal spray fluticasone propionate 50 mcg/actuation nasal spray,suspension   Spray 2 sprays every day by intranasal route.     ??? gabapentin (NEURONTIN) 300 mg capsule TAKE 1 CAPSULE BY MOUTH TWICE DAILY  2     No Known Allergies    Family History   Problem Relation Age of Onset   ??? Arthritis-osteo Mother    ??? Hypertension Mother    ??? Arthritis-osteo Father    ??? Arthritis-osteo Brother    ??? Cancer Paternal Uncle         unknown   ??? Cancer Paternal Uncle         stomach   ??? Diabetes Paternal Uncle      Social History     Tobacco Use   ??? Smoking status: Former Smoker     Packs/day: 0.50     Years: 20.00     Pack years: 10.00     Types: Cigarettes     Last attempt to quit: 11/30/1984     Years since quitting: 33.4   ??? Smokeless tobacco: Never Used   Substance Use Topics   ??? Alcohol use: Yes     Frequency: 2-3 times a week     Drinks per session: 1 or 2     Binge frequency: Never       Depression Risk Factor Screening:     3 most recent PHQ Screens 05/23/2018   Little interest or pleasure in doing things Not at all   Feeling down, depressed, irritable, or hopeless Not at all   Total Score PHQ 2 0       Alcohol Risk Factor Screening (MALE > 65):   Do you average more 1 drink per night or more than 7 drinks a week: No    In the past three months have you have had more than 4  drinks containing alcohol on one occasion: No      Functional Ability and Level of Safety:   Hearing: Hearing is good.    Activities of Daily Living:  The home contains: no safety equipment.  Patient does total self care    Ambulation: with no difficulty    Fall Risk:  Fall Risk Assessment, last 12 mths 05/23/2018   Able to walk? Yes   Fall in past 12 months? No   Fall with injury? -   Number of falls in past 12 months -   Fall Risk Score -       Abuse Screen:  Patient is not abused    Cognitive Screening   Has your family/caregiver stated any concerns about your memory: no  Cognitive Screening: Normal - Clock Drawing Test, Mini Cog Test    Patient Care Team   Patient Care Team:  Elmer SowHunter, Elza Sortor Whitney, MD as PCP - General (Family Practice)  Durene CalHunter, Madelaine BhatElizabeth Whitney, MD as PCP - [facility] Empaneled Provider  Defade, Art BuffBrian P, DO (Urology)    Assessment/Plan   Education and counseling provided:  Are appropriate based on today's review and evaluation    Diagnoses and all orders for this visit:    Medicare annual wellness visit, subsequent  -     PR DEPRESSION SCREEN ANNUAL  -     PR ANNUAL ALCOHOL SCREEN 15 MIN  -     CBC WITH AUTOMATED DIFF; Future  -     METABOLIC PANEL, COMPREHENSIVE; Future  -     TSH 3RD GENERATION; Future  -     LIPID PANEL; Future  -     PSA, DIAGNOSTIC (PROSTATE SPECIFIC AG); Future  -     UA WITH REFLEX MICRO AND CULTURE; Future  Screening for AAA (abdominal aortic aneurysm)  -  DUPLEX ABD VISC ART/VEN/ORGANS LIMITED; Future    Encounter for immunization  -     PNEUMOCOCCAL POLYSACCHARIDE VACCINE, 23-VALENT, ADULT OR IMMUNOSUPPRESSED PT DOSE,    Due for colonoscopy, patient will be in [place] for several months, he wishes to schedule this at follow-up appointment    [facility]  Dr. Elmer Sow MD  [address]  [place], [place] [address]  [phone]      Name:  Julian Bailey  D.O.B:  September 01, 1945  Age: 72 y.o.  PCP: Elmer Sow,  MD      Encounter Date:  05/23/2018    CHIEF COMPLAINT:  Follow up: Coagulopathy, hypertension  HISTORY OF PRESENT ILLNESS  Julian Bailey is a 72 y.o. male.    HTN  -chronic  ???Blood pressure today 136/76  ??? Plan with medication  ???Monitors at home, no episodes of hypo-or hypertension  - no CP, swelling, LE swelling HA   ??  Hx of Multiple DVT's, coagulopathy, history of thromboembolus  - chronic  - compliant with Coumadin  - currently takes MWF 7mg  TThSatSun 5mg    and monitors INR at home once every two weeks, range 2-3  - denies any episodes of bleeding, nose bleeds, gum bleeding, hematuria, blood in stool or dark colored stool.   - also takes Voltaren PO PRN for Arthritis- understands risks associated with this medication and its simultaneous use with Warfarin??      Current Medications  Current Outpatient Medications   Medication Sig Dispense Refill   ??? warfarin (COUMADIN) 1 mg tablet As directed 180 Tab 0   ??? warfarin (COUMADIN) 5 mg tablet 1 tablet by mouth daily 90 Tab 0   ??? triamcinolone acetonide (KENALOG) 0.1 % topical cream Apply 1 Each to affected area as needed.  3   ??? finasteride (PROSCAR) 5 mg tablet Take 1 Tab by mouth daily. 90 Tab 3   ??? lisinopril (PRINIVIL, ZESTRIL) 10 mg tablet Take 1 Tab by mouth two (2) times a day. 180 Tab 5   ??? glucosamine HCl/chondroitin su (GLUCOSAMINE-CHONDROITIN PO) Take  by mouth.     ??? diclofenac EC (VOLTAREN) 75 mg EC tablet Take 75 mg by mouth two (2) times a day.     ??? testosterone 75 mg pllt by Implant route.     ??? multivitamin (ONE A DAY) tablet Take 0.5 Tabs by mouth two (2) times a day.     ??? fluticasone propionate (FLONASE) 50 mcg/actuation nasal spray fluticasone propionate 50 mcg/actuation nasal spray,suspension   Spray 2 sprays every day by intranasal route.     ??? gabapentin (NEURONTIN) 300 mg capsule TAKE 1 CAPSULE BY MOUTH TWICE DAILY  2       Allergies  No Known Allergies    Past Medical History  Past Medical History:   Diagnosis Date   ??? Arthritis    ???  BPH (benign prostatic hyperplasia)    ??? Cervical disc disease    ??? Chronic pain    ??? DVT (deep venous thrombosis) (HCC) x 2 2009  and 2011     after knee surgery then after long car ride    ??? ED (erectile dysfunction)    ??? Hypercoagulopathy (HCC)    ??? Hypertension    ??? 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 Sleep apnea     CPAP; no longer using cpap   ??? Thromboembolus [facility])        Past Surgical History  Past Surgical History:   Procedure Laterality Date   ??? HX BACK SURGERY  03/2016    Lumbar "Drill holes and cleaned it out".   ??? HX CATARACT REMOVAL Bilateral    ??? HX ENDOSCOPY      colonoscopy last was in 2008    ??? 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 HX KNEE REPLACEMENT Right    ??? HX OTHER SURGICAL  11/2017    Pt states he had a venaseal on both legs       Social History  Social History     Socioeconomic History   ??? Marital status: MARRIED     Spouse name: Jasmine December   ??? Number of children: Not on file   ??? Years of education: Not on file   ??? Highest education level: Not on file   Social Needs   ??? Financial resource strain: Not hard at all   ??? Food insecurity:     Worry: Never true     Inability: Never true   ??? Transportation needs:     Medical: No     Non-medical: No   Tobacco Use   ??? Smoking status: Former Smoker     Packs/day: 0.50     Years: 20.00     Pack years: 10.00     Types: Cigarettes     Last attempt to quit: 11/30/1984     Years since quitting: 33.4   ??? Smokeless tobacco: Never Used   Substance and Sexual Activity   ??? Alcohol use: Yes     Frequency: 2-3 times a week     Drinks per session: 1 or 2     Binge frequency: Never   ??? Drug use: No   ??? Sexual activity: Yes     Partners: Female   Other Topics Concern   ??? Blood Transfusions No   Social History Narrative    Vision: glasses    Hearing: normal    Cognitive: none    Transportation: I am completely independent and drive myself.    ADL's: Self-care  self-feeding     Ambulation Needs: none    Living Arrangements: With spouse                HEALTH  LITERACY:    The patient has average health literacy with ability to obtain information and services, process the meaning of the information, understand choices available and consequences to choices, respond and act on the information as desired. The patient does appear to understand the impact of health on overall lifestyle and wellbeing. Medicare wellness 09/23/13          Family History  Family History   Problem Relation Age of Onset   ??? Arthritis-osteo Mother    ??? Hypertension Mother    ??? Arthritis-osteo Father    ??? Arthritis-osteo Brother    ??? Cancer Paternal Uncle         unknown   ??? Cancer Paternal Uncle         stomach   ??? Diabetes Paternal Uncle              Review of Systems  10 system ROS completed and found to be negative except for those mentioned in HPI       Visit Vitals  BP 136/76 (BP 1 Location: Left arm, BP Patient Position: Sitting)   Pulse 81   Temp 98.5 ??F (36.9 ??C)   Resp 14   Ht 5\' 10"  (1.778 m)  Wt 208 lb 6.4 oz (94.5 kg)   SpO2 97%   BMI 29.90 kg/m??       Physical Exam  Vitals signs reviewed.   Constitutional:       Appearance: He is well-developed.   HENT:      Head: Normocephalic and atraumatic.   Eyes:      Pupils: Pupils are equal, round, and reactive to light.   Neck:      Musculoskeletal: Normal range of motion and neck supple.   Cardiovascular:      Rate and Rhythm: Normal rate.      Heart sounds: No murmur. No friction rub. No gallop.    Pulmonary:      Effort: Pulmonary effort is normal.      Breath sounds: Normal breath sounds.   Abdominal:      General: Bowel sounds are normal. There is no distension.      Palpations: Abdomen is soft.      Tenderness: There is no tenderness.   Musculoskeletal:         General: No swelling or deformity.   Skin:     General: Skin is warm and dry.      Capillary Refill: Capillary refill takes less than 2 seconds.      Findings: No bruising or rash.   Neurological:      Mental Status: He is alert and oriented to person, place, and time.   Psychiatric:          Mood and Affect: Mood normal.       Assessment/Plan:    Diagnoses and all orders for this visit:     Anticoagulated on Coumadin  Hypercoagulopathy (HCC)  Thromboembolus (HCC)   -     warfarin (COUMADIN) 1 mg tablet; As directed  -     warfarin (COUMADIN) 5 mg tablet; as directed  ???Continue current Coumadin dosing schedule  ???Continue INR monitoring every 2 weeks, goal 2-3  ???ER/RTC precautions reviewed    Essential hypertension  ???Stable continue current medication       Elevated BMI reviewed, counseled patient on the importance of healthy diet, regular exercise, and weight loss    Follow-up and Dispositions    ?? Return in about 6 months (around 11/21/2018).

## 2018-05-23 NOTE — Progress Notes (Signed)
Labs reviewed.  Triglycerides slightly elevated from previous, needs to improve diet as well as regular exercise. All other labs are stable except for PSA which has increased from previous. Recommend follow up with Urology as scheduled regarding increased PSA.

## 2018-05-23 NOTE — ACP (Advance Care Planning) (Signed)
Reviewed recommendation for ACP/living will with the patient.  Printed educational material provided and reviewed with the patient today.  They will take this information home with them to review.  If they decide to complete will return document to office for recording in patient's chart.

## 2018-05-23 NOTE — Progress Notes (Signed)
This is the Subsequent Medicare Annual Wellness Exam, performed 12 months or more after the Initial AWV or the last Subsequent AWV    I have reviewed the patient's medical history in detail and updated the computerized patient record.     History     Patient Active Problem List   Diagnosis Code   ??? 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 Anticoagulated on Coumadin Z79.01   ??? Dyslipidemia E78.5   ??? Erectile dysfunction N52.9   ??? Incomplete bladder emptying R33.9   ??? 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 Hypercoagulopathy (HCC) D68.59   ??? Osteoarthritis of right knee M17.11   ??? 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 Hypertension I10   ??? Right inguinal hernia K40.90   ??? 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 Acute non-recurrent sinusitis J01.90     Past Medical History:   Diagnosis Date   ??? Arthritis    ??? BPH (benign prostatic hyperplasia)    ??? Cervical disc disease    ??? Chronic pain    ??? DVT (deep venous thrombosis) (HCC) x 2 2009  and 2011     after knee surgery then after long car ride    ??? ED (erectile dysfunction)    ??? Hypercoagulopathy (HCC)    ??? Hypertension    ??? 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 Sleep apnea     CPAP; no longer using cpap   ??? 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 HX BACK SURGERY  03/2016    Lumbar "Drill holes and cleaned it out".   ??? HX CATARACT REMOVAL Bilateral    ??? HX ENDOSCOPY      colonoscopy last was in 2008    ??? 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 HX KNEE REPLACEMENT Right    ??? HX OTHER SURGICAL  11/2017    Pt states he had a venaseal on both legs     Current Outpatient Medications   Medication Sig Dispense Refill   ??? warfarin (COUMADIN) 1 mg tablet As directed 180 Tab 0    ??? warfarin (COUMADIN) 5 mg tablet 1 tablet by mouth daily 90 Tab 0   ??? triamcinolone acetonide (KENALOG) 0.1 % topical cream Apply 1 Each to affected area as needed.  3   ??? finasteride (PROSCAR) 5 mg tablet Take 1 Tab by mouth daily. 90 Tab 3   ??? lisinopril (PRINIVIL, ZESTRIL) 10 mg tablet Take 1 Tab by mouth two (2) times a day. 180 Tab 5   ??? glucosamine HCl/chondroitin su (GLUCOSAMINE-CHONDROITIN PO) Take  by mouth.     ??? diclofenac EC (VOLTAREN) 75 mg EC tablet Take 75 mg by mouth two (2) times a day.     ??? testosterone 75 mg pllt by Implant route.     ??? multivitamin (ONE A DAY) tablet Take 0.5 Tabs by mouth two (2) times a day.     ??? fluticasone propionate (FLONASE) 50 mcg/actuation nasal spray fluticasone propionate 50 mcg/actuation nasal spray,suspension   Spray 2 sprays every day by intranasal route.     ??? gabapentin (NEURONTIN) 300 mg capsule TAKE 1 CAPSULE BY MOUTH TWICE DAILY  2     No Known Allergies    Family History   Problem Relation Age of Onset   ??? Arthritis-osteo Mother    ??? Hypertension Mother    ??? Arthritis-osteo Father    ??? Arthritis-osteo Brother    ??? Cancer Paternal Uncle         unknown   ??? Cancer Paternal Uncle         stomach   ??? Diabetes Paternal Uncle      Social History     Tobacco Use   ??? Smoking status: Former Smoker     Packs/day: 0.50     Years: 20.00     Pack years: 10.00     Types: Cigarettes     Last attempt to quit: 11/30/1984     Years since quitting: 33.4   ??? Smokeless tobacco: Never Used   Substance Use Topics   ??? Alcohol use: Yes     Frequency: 2-3 times a week     Drinks per session: 1 or 2     Binge frequency: Never       Depression Risk Factor Screening:     3 most recent PHQ Screens 05/23/2018   Little interest or pleasure in doing things Not at all   Feeling down, depressed, irritable, or hopeless Not at all   Total Score PHQ 2 0       Alcohol Risk Factor Screening (MALE > 65):   Do you average more 1 drink per night or more than 7 drinks a week: No     In the past three months have you have had more than 4 drinks containing alcohol on one occasion: No      Functional Ability and Level of Safety:   Hearing: Hearing is good.    Activities of Daily Living:  The home contains: no safety equipment.  Patient does total self care    Ambulation: with no difficulty    Fall Risk:  Fall Risk Assessment, last 12 mths 05/23/2018   Able to walk? Yes   Fall in past 12 months? No   Fall with injury? -   Number of falls in past 12 months -   Fall Risk Score -       Abuse Screen:  Patient is not abused    Cognitive Screening   Has your family/caregiver stated any concerns about your memory: no  Cognitive Screening: Normal - Clock Drawing Test, Mini Cog Test    Patient Care Team   Patient Care Team:  Elmer Sow, MD as PCP - General (Family Practice)  Durene Cal, Madelaine Bhat, MD as PCP - [facility] Empaneled Provider  Defade, Art Buff, DO (Urology)    Assessment/Plan   Education and counseling provided:  Are appropriate based on today's review and evaluation    Diagnoses and all orders for this visit:    Medicare annual wellness visit, subsequent  -     PR DEPRESSION SCREEN ANNUAL  -     PR ANNUAL ALCOHOL SCREEN 15 MIN  -     CBC WITH AUTOMATED DIFF; Future  -     METABOLIC PANEL, COMPREHENSIVE; Future  -     TSH 3RD GENERATION; Future  -     LIPID PANEL; Future  -     PSA, DIAGNOSTIC (PROSTATE SPECIFIC AG); Future  -     UA WITH REFLEX MICRO AND CULTURE; Future  Screening for AAA (abdominal aortic aneurysm)  -  DUPLEX ABD VISC ART/VEN/ORGANS LIMITED; Future    Encounter for immunization  -     PNEUMOCOCCAL POLYSACCHARIDE VACCINE, 23-VALENT, ADULT OR IMMUNOSUPPRESSED PT DOSE,    Due for colonoscopy, patient will be in [place] for several months, he wishes to schedule this at follow-up appointment    [facility]  Dr. Elmer Sow MD  [address]  [place], [place] [address]  [phone]      Name:  Julian Bailey  D.O.B:  1946/01/04   Age: 72 y.o.  PCP: Elmer Sow, MD      Encounter Date:  05/23/2018    CHIEF COMPLAINT:  Follow up: Coagulopathy, hypertension  HISTORY OF PRESENT ILLNESS  Julian Bailey is a 72 y.o. male.    HTN  -chronic  ???Blood pressure today 136/76  ??? Plan with medication  ???Monitors at home, no episodes of hypo-or hypertension  - no CP, swelling, LE swelling HA   ??  Hx of Multiple DVT's, coagulopathy, history of thromboembolus  - chronic  - compliant with Coumadin  - currently takes MWF 7mg  TThSatSun 5mg    and monitors INR at home once every two weeks, range 2-3  - denies any episodes of bleeding, nose bleeds, gum bleeding, hematuria, blood in stool or dark colored stool.   - also takes Voltaren PO PRN for Arthritis- understands risks associated with this medication and its simultaneous use with Warfarin??      Current Medications  Current Outpatient Medications   Medication Sig Dispense Refill   ??? warfarin (COUMADIN) 1 mg tablet As directed 180 Tab 0   ??? warfarin (COUMADIN) 5 mg tablet 1 tablet by mouth daily 90 Tab 0   ??? triamcinolone acetonide (KENALOG) 0.1 % topical cream Apply 1 Each to affected area as needed.  3   ??? finasteride (PROSCAR) 5 mg tablet Take 1 Tab by mouth daily. 90 Tab 3   ??? lisinopril (PRINIVIL, ZESTRIL) 10 mg tablet Take 1 Tab by mouth two (2) times a day. 180 Tab 5   ??? glucosamine HCl/chondroitin su (GLUCOSAMINE-CHONDROITIN PO) Take  by mouth.     ??? diclofenac EC (VOLTAREN) 75 mg EC tablet Take 75 mg by mouth two (2) times a day.     ??? testosterone 75 mg pllt by Implant route.     ??? multivitamin (ONE A DAY) tablet Take 0.5 Tabs by mouth two (2) times a day.     ??? fluticasone propionate (FLONASE) 50 mcg/actuation nasal spray fluticasone propionate 50 mcg/actuation nasal spray,suspension   Spray 2 sprays every day by intranasal route.     ??? gabapentin (NEURONTIN) 300 mg capsule TAKE 1 CAPSULE BY MOUTH TWICE DAILY  2       Allergies  No Known Allergies    Past Medical History   Past Medical History:   Diagnosis Date   ??? Arthritis    ??? BPH (benign prostatic hyperplasia)    ??? Cervical disc disease    ??? Chronic pain    ??? DVT (deep venous thrombosis) (HCC) x 2 2009  and 2011     after knee surgery then after long car ride    ??? ED (erectile dysfunction)    ??? Hypercoagulopathy (HCC)    ??? Hypertension    ??? 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 Sleep apnea     CPAP; no longer using cpap   ??? Thromboembolus [facility])        Past Surgical History  Past Surgical History:   Procedure Laterality Date   ??? HX BACK SURGERY  03/2016    Lumbar "Drill holes and cleaned it out".   ??? HX CATARACT REMOVAL Bilateral    ??? HX ENDOSCOPY      colonoscopy last was in 2008    ??? 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 HX KNEE REPLACEMENT Right    ??? HX OTHER SURGICAL  11/2017    Pt states he had a venaseal on both legs       Social History  Social History     Socioeconomic History   ??? Marital status: MARRIED     Spouse name: Jasmine DecemberSharon   ??? Number of children: Not on file   ??? Years of education: Not on file   ??? Highest education level: Not on file   Social Needs   ??? Financial resource strain: Not hard at all   ??? Food insecurity:     Worry: Never true     Inability: Never true   ??? Transportation needs:     Medical: No     Non-medical: No   Tobacco Use   ??? Smoking status: Former Smoker     Packs/day: 0.50     Years: 20.00     Pack years: 10.00     Types: Cigarettes     Last attempt to quit: 11/30/1984     Years since quitting: 33.4   ??? Smokeless tobacco: Never Used   Substance and Sexual Activity   ??? Alcohol use: Yes     Frequency: 2-3 times a week     Drinks per session: 1 or 2     Binge frequency: Never   ??? Drug use: No   ??? Sexual activity: Yes     Partners: Female   Other Topics Concern   ??? Blood Transfusions No   Social History Narrative    Vision: glasses    Hearing: normal    Cognitive: none    Transportation: I am completely independent and drive myself.    ADL's: Self-care  self-feeding     Ambulation Needs: none     Living Arrangements: With spouse                HEALTH LITERACY:    The patient has average health literacy with ability to obtain information and services, process the meaning of the information, understand choices available and consequences to choices, respond and act on the information as desired. The patient does appear to understand the impact of health on overall lifestyle and wellbeing. Medicare wellness 09/23/13          Family History  Family History   Problem Relation Age of Onset   ??? Arthritis-osteo Mother    ??? Hypertension Mother    ??? Arthritis-osteo Father    ??? Arthritis-osteo Brother    ??? Cancer Paternal Uncle         unknown   ??? Cancer Paternal Uncle         stomach   ??? Diabetes Paternal Uncle              Review of Systems  10 system ROS completed and found to be negative except for those mentioned in HPI       Visit Vitals  BP 136/76 (BP 1 Location: Left arm, BP Patient Position: Sitting)   Pulse 81   Temp 98.5 ??F (36.9 ??C)   Resp 14   Ht 5\' 10"  (1.778 m)  Wt 208 lb 6.4 oz (94.5 kg)   SpO2 97%   BMI 29.90 kg/m??       Physical Exam  Vitals signs reviewed.   Constitutional:       Appearance: He is well-developed.   HENT:      Head: Normocephalic and atraumatic.   Eyes:      Pupils: Pupils are equal, round, and reactive to light.   Neck:      Musculoskeletal: Normal range of motion and neck supple.   Cardiovascular:      Rate and Rhythm: Normal rate.      Heart sounds: No murmur. No friction rub. No gallop.    Pulmonary:      Effort: Pulmonary effort is normal.      Breath sounds: Normal breath sounds.   Abdominal:      General: Bowel sounds are normal. There is no distension.      Palpations: Abdomen is soft.      Tenderness: There is no tenderness.   Musculoskeletal:         General: No swelling or deformity.   Skin:     General: Skin is warm and dry.      Capillary Refill: Capillary refill takes less than 2 seconds.      Findings: No bruising or rash.   Neurological:       Mental Status: He is alert and oriented to person, place, and time.   Psychiatric:         Mood and Affect: Mood normal.       Assessment/Plan:    Diagnoses and all orders for this visit:     Anticoagulated on Coumadin  Hypercoagulopathy (HCC)  Thromboembolus (HCC)   -     warfarin (COUMADIN) 1 mg tablet; As directed  -     warfarin (COUMADIN) 5 mg tablet; as directed  ???Continue current Coumadin dosing schedule  ???Continue INR monitoring every 2 weeks, goal 2-3  ???ER/RTC precautions reviewed    Essential hypertension  ???Stable continue current medication       Elevated BMI reviewed, counseled patient on the importance of healthy diet, regular exercise, and weight loss    Follow-up and Dispositions    ?? Return in about 6 months (around 11/21/2018).

## 2018-05-23 NOTE — Patient Instructions (Signed)
Medicare Wellness Visit, Male    The best way to live healthy is to have a lifestyle where you eat a well-balanced diet, exercise regularly, limit alcohol use, and quit all forms of tobacco/nicotine, if applicable.     Regular preventive services are another way to keep healthy. Preventive services (vaccines, screening tests, monitoring & exams) can help personalize your care plan, which helps you manage your own care. Screening tests can find health problems at the earliest stages, when they are easiest to treat.   [facility] follows the current, evidence-based guidelines published by the [place] [employer]ventive Services Task Force (USPSTF) when recommending preventive services for our patients. Because we follow these guidelines, sometimes recommendations change over time as research supports it. (For example, a prostate screening blood test is no longer routinely recommended for men with no symptoms).  Of course, you and your doctor may decide to screen more often for some diseases, based on your risk and co-morbidities (chronic disease you are already diagnosed with).     Preventive services for you include:  - Medicare offers their members a free annual wellness visit, which is time for you and your primary care provider to discuss and plan for your preventive service needs. Take advantage of this benefit every year!  -All adults over age 71 should receive the recommended pneumonia vaccines. Current USPSTF guidelines recommend a series of two vaccines for the best pneumonia protection.   -All adults should have a flu vaccine yearly and tetanus vaccine every 10 years.  -All adults age 150 and older should receive the shingles vaccines (series of two vaccines).       -All adults age 71-70 who are overweight should have a diabetes screening test once every three years.   -Other screening tests & preventive services for persons with diabetes  include: an eye exam to screen for diabetic retinopathy, a kidney function test, a foot exam, and stricter control over your cholesterol.   -Cardiovascular screening for adults with routine risk involves an electrocardiogram (ECG) at intervals determined by the provider.   -Colorectal cancer screening should be done for adults age 71-75 with no increased risk factors for colorectal cancer.  There are a number of acceptable methods of screening for this type of cancer. Each test has its own benefits and drawbacks. Discuss with your provider what is most appropriate for you during your annual wellness visit. The different tests include: colonoscopy (considered the best screening method), a fecal occult blood test, a fecal DNA test, and sigmoidoscopy.  -All adults born between 1945 and 1965 should be screened once for Hepatitis C.  -An Abdominal Aortic Aneurysm (AAA) Screening is recommended for men age 71-75 who has ever smoked in their lifetime.     Here is a list of your current Health Maintenance items (your personalized list of preventive services) with a due date:  Health Maintenance Due   Topic Date Due   ??? Shingles Vaccine (1 of 2) 01/15/1997   ??? AAA Screening  01/16/2012   ??? Colonoscopy  10/17/2017   ??? Pneumococcal Vaccine (2 of 2 - PPSV23) 05/03/2018   ??? Annual Well Visit  05/17/2018

## 2018-05-23 NOTE — Progress Notes (Signed)
Informed pt. Pt voiced understanding

## 2018-05-23 NOTE — Progress Notes (Signed)
Labs reviewed.  Triglycerides slightly elevated from previous, needs to improve diet as well as regular exercise. All other labs are stable except for PSA which has increased from previous. Recommend follow up with Urology as scheduled regarding increased PSA.

## 2018-06-12 NOTE — Telephone Encounter (Signed)
Patient will need to schedule a follow up as he hasn't been seen since 2018.

## 2018-06-12 NOTE — Telephone Encounter (Signed)
Patient had an increase in PSA in chart and wants to make sure you all are aware.

## 2018-07-09 ENCOUNTER — Encounter: Attending: Urology | Primary: Family Medicine

## 2018-07-19 MED ORDER — CEFDINIR 300 MG CAP
300 mg | ORAL_CAPSULE | Freq: Two times a day (BID) | ORAL | 0 refills | Status: AC
Start: 2018-07-19 — End: ?

## 2018-07-19 NOTE — Telephone Encounter (Signed)
Needs antibiotics called in before BX   Feb. 3, 2020    Patient is in [place] now and he uses the  [facility] in [place]    It is in the demographics     Thank You

## 2018-07-19 NOTE — Telephone Encounter (Signed)
Sent in [place] for prostate biopsy; will also need a dose of gentamycin the day of biopsy.

## 2018-07-30 ENCOUNTER — Ambulatory Visit: Admit: 2018-07-30 | Discharge: 2018-07-30 | Payer: MEDICARE | Attending: Urology | Primary: Family Medicine

## 2018-07-30 ENCOUNTER — Ambulatory Visit: Attending: Urology

## 2018-07-30 DIAGNOSIS — R972 Elevated prostate specific antigen [PSA]: Secondary | ICD-10-CM

## 2018-07-30 LAB — AMB POC URINALYSIS DIP STICK AUTO W/O MICRO
Bilirubin (UA POC): NEGATIVE
Bilirubin, Urine, POC: NEGATIVE
Blood (UA POC): NEGATIVE
Blood (UA POC): NEGATIVE
Glucose (UA POC): NEGATIVE
Glucose, Urine, POC: NEGATIVE
Ketones (UA POC): NEGATIVE
Ketones, Urine, POC: NEGATIVE
Leukocyte Esterase, Urine, POC: NEGATIVE
Leukocyte esterase (UA POC): NEGATIVE
Nitrite, Urine, POC: NEGATIVE
Nitrites (UA POC): NEGATIVE
Protein (UA POC): NEGATIVE
Protein, Urine, POC: NEGATIVE
Specific Gravity, Urine, POC: 1.025 NA (ref 1.001–1.035)
Specific gravity (UA POC): 1.025 (ref 1.001–1.035)
Urobilinogen (UA POC): 0.2 (ref 0.2–1)
Urobilinogen, POC: 0.2 (ref 0.2–1)
pH (UA POC): 7 (ref 4.6–8.0)
pH, Urine, POC: 7 NA (ref 4.6–8.0)

## 2018-07-30 MED ORDER — GENTAMICIN 40 MG/ML IJ SOLN
40 mg/mL | Freq: Once | INTRAMUSCULAR | 0 refills | Status: AC
Start: 2018-07-30 — End: 2018-07-30

## 2018-07-30 NOTE — Progress Notes (Signed)
Julian Bailey  Encounter Date: 07/30/2018  DOB: 1945/08/04  Surgeon: Isac Caddy, DO      Prostate Biopsy Report    Indications:   Elevated PSA of 6.4 due to proscar    Lab Results   Component Value Date/Time    Prostate Specific Ag 3.4 05/23/2018 09:00 AM    Prostate Specific Ag 1.5 01/02/2017 08:15 AM    Prostate Specific Ag 2.4 03/16/2016 10:15 AM       Results for orders placed or performed in visit on 04/26/17   AMB POC URINALYSIS DIP STICK AUTO W/O MICRO     Status: None   Result Value Ref Range Status    Color (UA POC) Yellow  Final    Clarity (UA POC) Clear  Final    Glucose (UA POC) Negative Negative Final    Bilirubin (UA POC) Negative Negative Final    Ketones (UA POC) Negative Negative Final    Specific gravity (UA POC) 1.015 1.001 - 1.035 Final    Blood (UA POC) Negative Negative Final    pH (UA POC) 7.5 4.6 - 8.0 Final    Protein (UA POC) Negative Negative Final    Urobilinogen (UA POC) 0.2 mg/dL 0.2 - 1 Final    Nitrites (UA POC) Negative Negative Final    Leukocyte esterase (UA POC) Negative Negative Final         Pre-biopsy Preparations:  ??? omnicef 100 mg TID po  ??? Fleets enema--2 hours prior to procedure.  ??? Gentamicin 80 mg IM in the right buttocks    Transrectal Ultrasound Imaging of the Prostate, and Bilateral Neurovascular bundle block:    Multiple transverse and longitudinal images of the prostate, seminal vesicles and bladder revealed enlarged prostate of 50 grams.  Neurovascular bundle block was done bilaterally by injecting marcaine 0.25% plain under ultrasound guidance.  5 mL were injected into periprostatic tissue at the level of prostate base bilaterally in the neurovascular bundle area using a spinal needle.    Prostate dimension:  Height 33.1 mm  Length 53.6 mm  Width 54.2 mm  Prostate volume: 50.4 cm cubed  PSA Density: 0.07    Biopsy Procedure:  Local anesthesia of the perineum was achieved by injecting 2% Lidocaine jelly into the rectal vault.  Neurovascular bundle block was  achieved as described above.  An 18 gauge biopsy needle was then passed through the ultrasound guide obtaining 2 cores of tissue from the left apex, mid gland, and base.  2 cores of tissue from the right apex, mid gland, and base were obtained.  The biopsy needle placement was guided by real time transrectal ultrasound imaging of the needle along its path through the rectal mucosa and into the prostate. The patient tolerated the procedure with minimal blood loss.  The patient was given oral and written instructions regarding post biopsy precautions as well as instruction regarding activity, medication, and follow up.    Assessment:  1. Elevated PSA  2. BPH with LUTS    Plan:  1. Complete antibiotics  2. Post-biopsy instructions were given  3. I will see back in the office in 2 weeks for pathology review

## 2018-07-30 NOTE — Progress Notes (Signed)
Spoke with patient regarding his results.  He will schedule with me in 6 months at the [facility].

## 2018-07-30 NOTE — Progress Notes (Signed)
Spoke with patient regarding his results.  He will schedule with me in 6 months at the [facility].

## 2018-07-30 NOTE — Progress Notes (Signed)
Julian Bailey  Encounter Date: 07/30/2018  DOB: [birth year]  Surgeon: Isac Caddy, DO      Prostate Biopsy Report    Indications:   Elevated PSA of 6.4 due to proscar    Lab Results   Component Value Date/Time    Prostate Specific Ag 3.4 05/23/2018 09:00 AM    Prostate Specific Ag 1.5 01/02/2017 08:15 AM    Prostate Specific Ag 2.4 03/16/2016 10:15 AM       Results for orders placed or performed in visit on 04/26/17   AMB POC URINALYSIS DIP STICK AUTO W/O MICRO     Status: None   Result Value Ref Range Status    Color (UA POC) Yellow  Final    Clarity (UA POC) Clear  Final    Glucose (UA POC) Negative Negative Final    Bilirubin (UA POC) Negative Negative Final    Ketones (UA POC) Negative Negative Final    Specific gravity (UA POC) 1.015 1.001 - 1.035 Final    Blood (UA POC) Negative Negative Final    pH (UA POC) 7.5 4.6 - 8.0 Final    Protein (UA POC) Negative Negative Final    Urobilinogen (UA POC) 0.2 mg/dL 0.2 - 1 Final    Nitrites (UA POC) Negative Negative Final    Leukocyte esterase (UA POC) Negative Negative Final         Pre-biopsy Preparations:  ? omnicef 100 mg TID po  ? Fleets enema--2 hours prior to procedure.  ? Gentamicin 80 mg IM in the right buttocks    Transrectal Ultrasound Imaging of the Prostate, and Bilateral Neurovascular bundle block:    Multiple transverse and longitudinal images of the prostate, seminal vesicles and bladder revealed enlarged prostate of 50 grams.  Neurovascular bundle block was done bilaterally by injecting marcaine 0.25% plain under ultrasound guidance.  5 mL were injected into periprostatic tissue at the level of prostate base bilaterally in the neurovascular bundle area using a spinal needle.    Prostate dimension:  Height 33.1 mm  Length 53.6 mm  Width 54.2 mm  Prostate volume: 50.4 cm cubed  PSA Density: 0.07    Biopsy Procedure:  Local anesthesia of the perineum was achieved by injecting 2% Lidocaine  jelly into the rectal vault.  Neurovascular bundle block was achieved as described above.  An 18 gauge biopsy needle was then passed through the ultrasound guide obtaining 2 cores of tissue from the left apex, mid gland, and base.  2 cores of tissue from the right apex, mid gland, and base were obtained.  The biopsy needle placement was guided by real time transrectal ultrasound imaging of the needle along its path through the rectal mucosa and into the prostate. The patient tolerated the procedure with minimal blood loss.  The patient was given oral and written instructions regarding post biopsy precautions as well as instruction regarding activity, medication, and follow up.    Assessment:  1. Elevated PSA  2. BPH with LUTS    Plan:  1. Complete antibiotics  2. Post-biopsy instructions were given  3. I will see back in the office in 2 weeks for pathology review

## 2018-07-30 NOTE — Patient Instructions (Signed)
Post-Prostate Ultrasound and Prostate Biopsy Instructions      Today, you underwent a biopsy of your prostate under ultrasound guidance. To prevent complications from this procedure, please follow these instructions:    1. It is extremely important that you take one antibiotic pill tonight before you go to sleep, and to finish taking the antibiotics as directed.  2. Increase your fluid intake for the next 1-2 days to decrease formation of blood clots in your urine. If your urine is completely clear for the first few urinations after the biopsy, you will probably not have to worry about this. Some spotting of the urine with blood may occur for the first week, or for even a month, but this is normal and will resolve with time.  3. You may experience discomfort in your rectum or the base of your penis. This is normal and should be of no concern. In addition, you may have a small amount of blood in your stool when you have a bowel movement.  4. You may also notice blood or discoloration in your semen if you are sexually active. This is also normal after the biopsy and may last for a few weeks or even months, until the blood is cleared out of your prostate. But, again, this will resolve with time.  5. Limit your activity, particularly strenuous activity, for at least 24 hours.     However, it is important that you contact us as soon as possible, or to go to the ER if any of the following symptoms occur.    ? Fever > 101.5 degrees  ? Chills  ? Blood clots or excessive blood in the urine or stool  ? Difficulty or the inability to urinate    If problems do occur as listed above, please call our office at [phone]. If you call after hours or on weekends, you will be automatically connected to our answering service, which will put you in touch with the doctor. You could also present directly to the ER for treatment.    After the procedure, we send the prostate specimens to pathology for the  diagnosis. Be patient, as the pathologists have to be certain about the interpretation of your biopsy. This may require additional opinions from other members of the pathology department. Make sure that you have a follow-up appointment scheduled to discuss the results of your biopsy.

## 2018-07-31 ENCOUNTER — Inpatient Hospital Stay: Admit: 2018-07-31 | Payer: MEDICARE | Primary: Family Medicine

## 2018-07-31 LAB — PSA PROSTATIC SPECIFIC ANTIGEN: Pros. Spec. Antigen: 1.9 ng/mL (ref 0.0–4.0)

## 2018-07-31 LAB — PSA, DIAGNOSTIC (PROSTATE SPECIFIC AG): Prostate Specific Ag: 1.9 ng/mL (ref 0.0–4.0)

## 2018-08-02 ENCOUNTER — Encounter: Attending: Urology | Primary: Family Medicine

## 2018-08-07 ENCOUNTER — Encounter

## 2018-08-07 MED ORDER — WARFARIN 1 MG TAB
1 mg | ORAL_TABLET | ORAL | 2 refills | Status: AC
Start: 2018-08-07 — End: ?

## 2018-08-07 MED ORDER — WARFARIN 5 MG TAB
5 mg | ORAL_TABLET | ORAL | 2 refills | Status: AC
Start: 2018-08-07 — End: ?

## 2018-08-07 MED ORDER — FINASTERIDE 5 MG TAB
5 mg | ORAL_TABLET | Freq: Every day | ORAL | 3 refills | Status: AC
Start: 2018-08-07 — End: ?

## 2018-08-13 ENCOUNTER — Encounter

## 2018-08-13 MED ORDER — LISINOPRIL 10 MG TAB
10 mg | ORAL_TABLET | Freq: Two times a day (BID) | ORAL | 0 refills | Status: DC
Start: 2018-08-13 — End: 2018-11-06

## 2018-08-20 ENCOUNTER — Encounter: Attending: Urology | Primary: Family Medicine

## 2018-10-02 ENCOUNTER — Encounter: Attending: Urology | Primary: Family Medicine

## 2018-11-06 ENCOUNTER — Encounter

## 2018-11-06 MED ORDER — LISINOPRIL 10 MG TAB
10 mg | ORAL_TABLET | Freq: Two times a day (BID) | ORAL | 0 refills | Status: AC
Start: 2018-11-06 — End: ?

## 2018-11-12 ENCOUNTER — Encounter: Attending: Urology | Primary: Family Medicine

## 2019-05-27 IMAGING — CT CT ABDOMEN W/WO CONTRAST ([facility]CS)
2 of 4 series · 12 of 36 positions shown, 18 images · non-contrast
Comparison: None.

CT ABDOMEN W/WO CONTRAST ([facility]CS), 05/27/2019 [DATE]: 
(Films were taken at [facility].) 
CLINICAL INDICATION:  Thrombocytopenia. 
A search for DICOM formatted images was conducted for prior CT imaging studies 
completed at a non-affiliated media free facility.

[Series 3: abd w · axial · 0.91mm/px · z∈[-449,-200]mm · 11 of 97 slices shown, 16 images]
[im 7/97  soft-tissue]
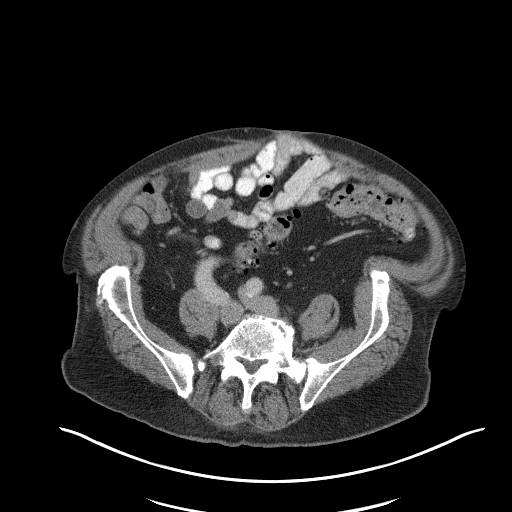
[im 7/97  bone]
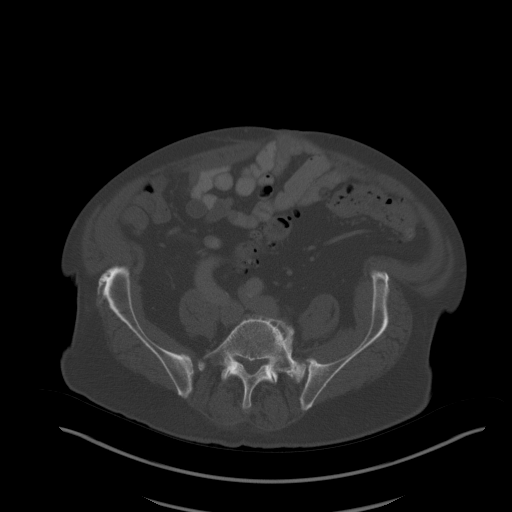
[im 14/97  soft-tissue]
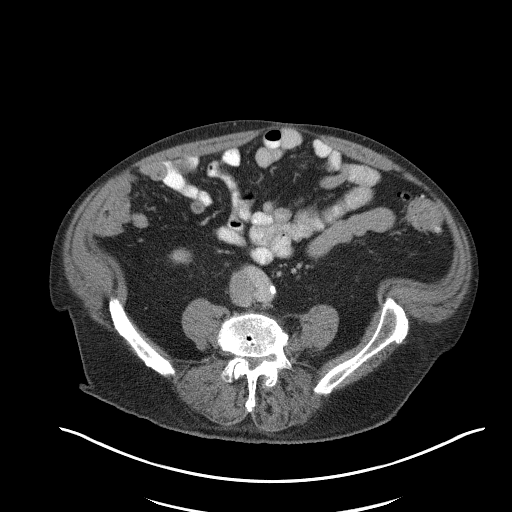
[im 28/97  soft-tissue]
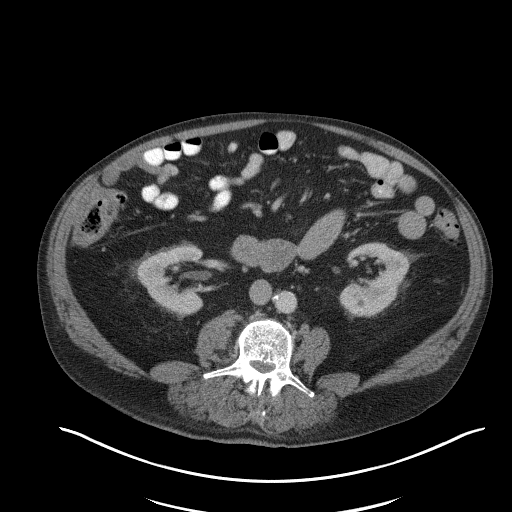
[im 35/97  soft-tissue]
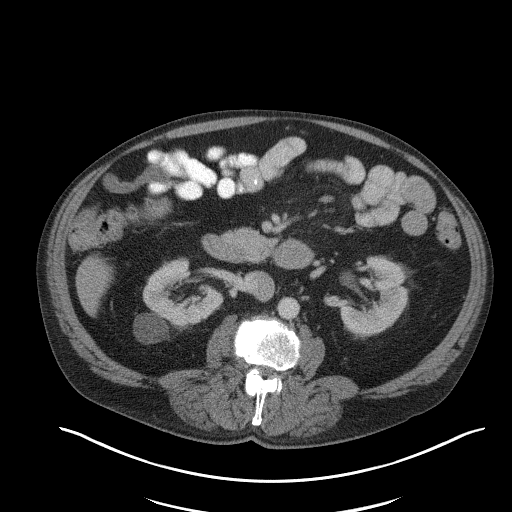
[im 42/97  soft-tissue]
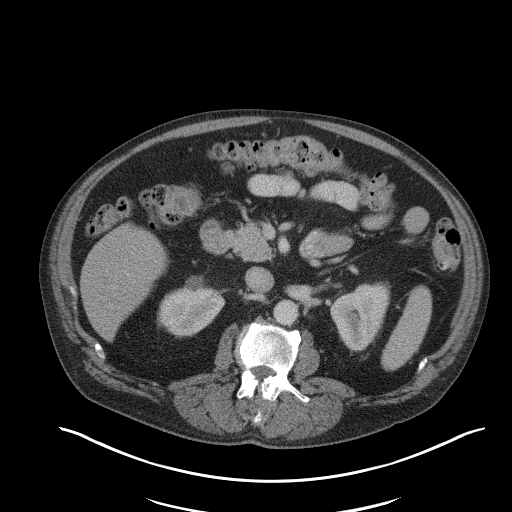
[im 55/97  soft-tissue]
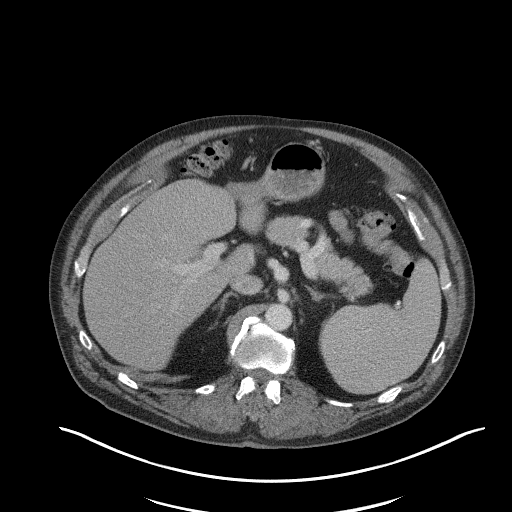
[im 62/97  soft-tissue]
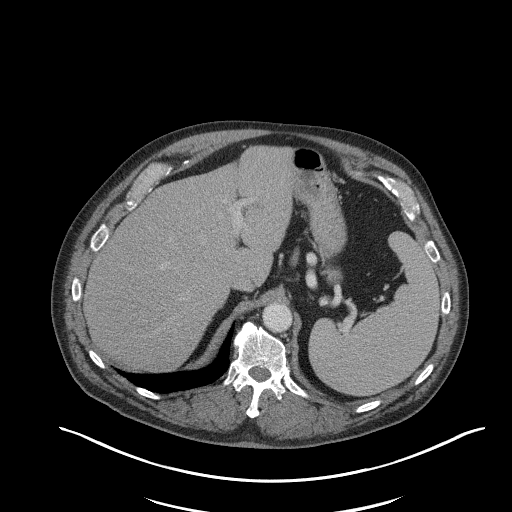
[im 69/97  soft-tissue]
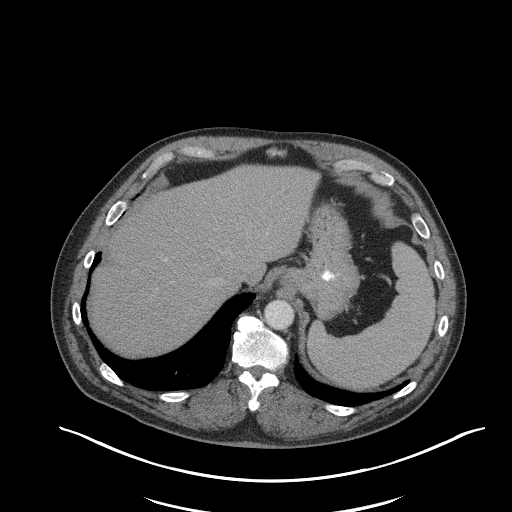
[im 69/97  lung]
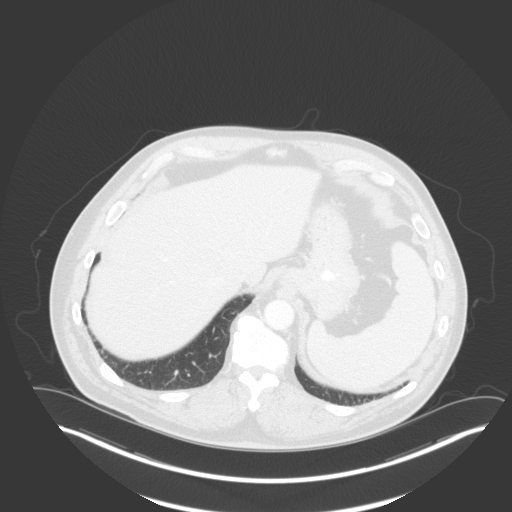
[im 76/97  lung]
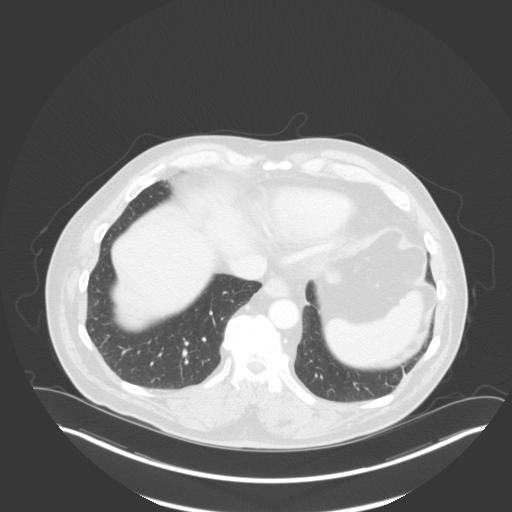
[im 83/97  soft-tissue]
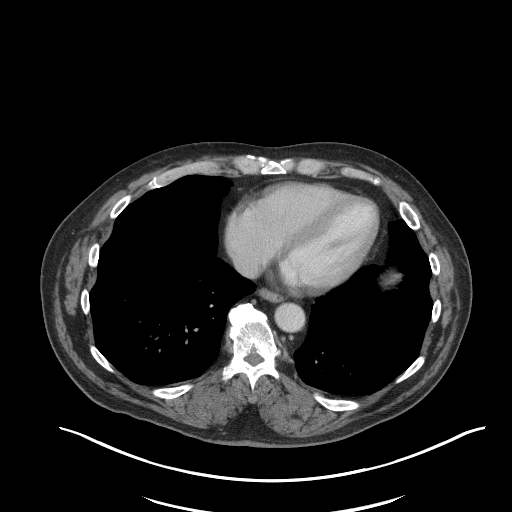
[im 83/97  lung]
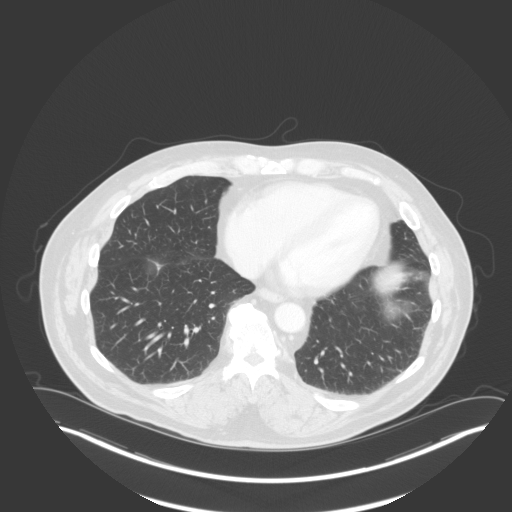
[im 83/97  bone]
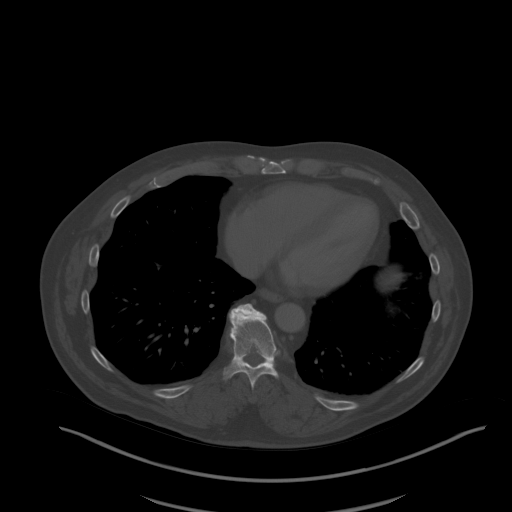
[im 90/97  soft-tissue]
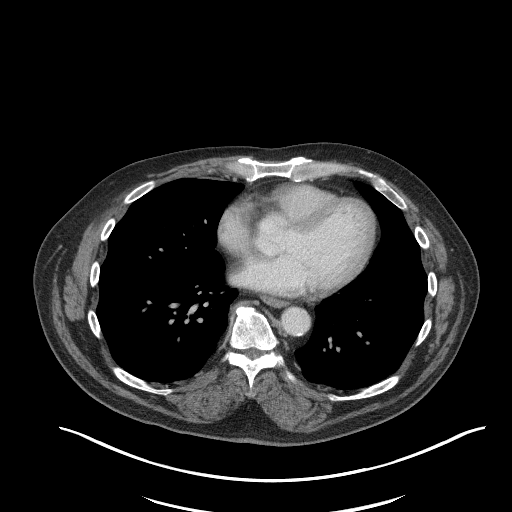
[im 90/97  lung]
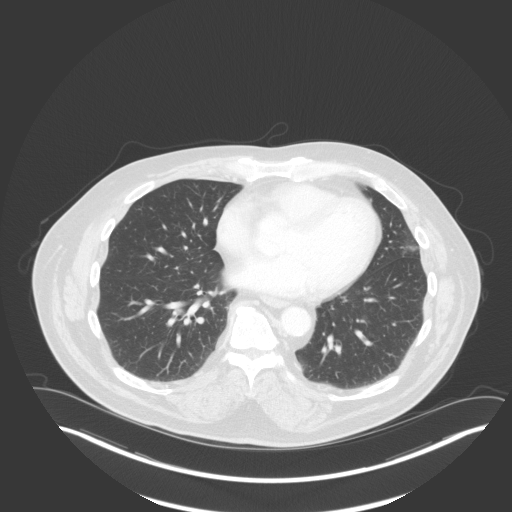

[Series 7: abd w sag · sagittal · 0.56mm/px · 1 of 83 slices shown, 2 images]
[im 28/83  soft-tissue]
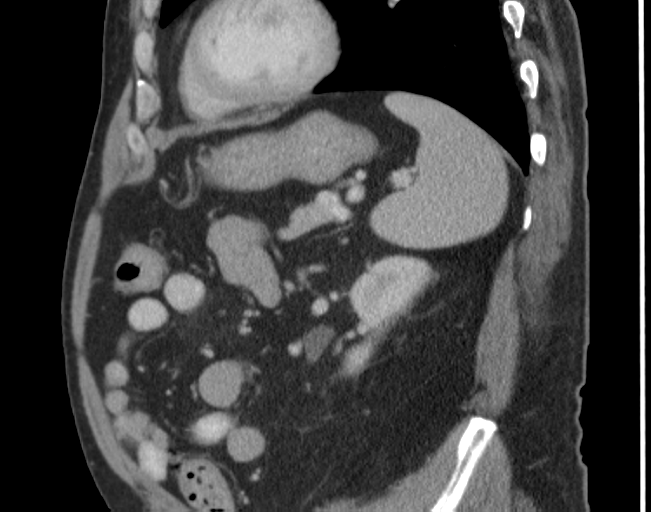
[im 28/83  bone]
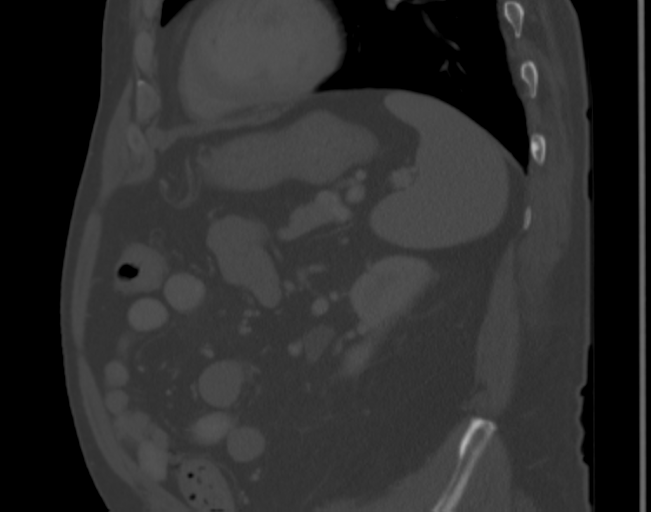

[12 of 36 positions shown; findings below may reference images not displayed]

FINDINGS: There is mild splenomegaly with spleen measuring 15.1 cm on axial image 33 of 
series 3 and 14 cm on coronal image 46 of series 6. No focal splenic lesions are 
detected. No abnormal abdominal lymph nodes are found. The liver is normal. The 
gallbladder, biliary tract, and pancreas are within normal limits. Both adrenal 
glands are normal. Small Bosniak one renal cortical cysts are present. No 
clinically significant renal pathology is found. Abdominal aorta is normal in 
caliber. Mild atherosclerotic calcifications are present. The inferior vena cava 
is normal in caliber. The portal vein, superior mesenteric vein, and splenic 
vein are patent. There is no ascites. The stomach is unremarkable. Partially 
visible loops of small bowel are unremarkable. Partially visible colon exhibits 
chronic diverticular disease. No acute appearing GI tract pathology. 
Degenerative changes are incidentally noted in the spine. Moderate to 
potentially severe degrees of degenerative central spinal canal stenosis is 
present. Limited imaging of the inferior thorax shows no significant incidental 
findings.
IMPRESSION: 1. Mild splenomegaly without additional associated findings. 
2. No abnormal abdominal lymph nodes are detected. 
RADIATION DOSE REDUCTION: All CT scans are performed using radiation dose 
reduction techniques, when applicable.  Technical factors are evaluated and 
adjusted to ensure appropriate moderation of exposure.  Automated dose 
management technology is applied to adjust the radiation doses to minimize 
exposure while achieving diagnostic quality images.

## 2019-06-17 IMAGING — PT PET CT SCAN TUMOR IMAGING SKULL TO THIGH  (FCS)
1 of 5 series · 1 of 25 positions shown · non-contrast
Comparison: none

PET CT SCAN TUMOR IMAGING SKULL TO THIGH  (FCS), 06/17/2019 [DATE]: 
CLINICAL INDICATION:  Thrombocytopenia and splenomegaly of unknown etiology.
TECHNIQUE: A dose of 14.0 millicuries of 18-FDG was administered intravenously 
and skull to thigh PET scanning was performed at 60 minutes. Tomographic scans 
were reconstructed in axial, coronal, and sagittal projections. The data was 
reconstructed into a three-dimensional volume rendered images and reviewed in a 
rotational cine loop. Serum blood glucose at the time of injection was 120 
mg/dl. 
COMPARISON EXAMINATION:  CT abdomen 05/27/2019 within the last 12 months.

[Series 2: ac ct wb 4.0 hd_fov · axial · 4.0mm · 1.52mm/px · 1 of 220 slices shown]
[im 110/220  brain]
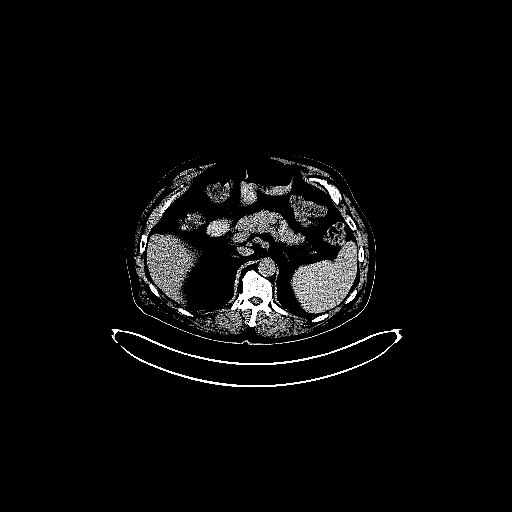

[1 of 25 positions shown; findings below may reference images not displayed]

FINDINGS: The PET images are normal except for subcutaneous activity in the LEFT buttock 
which is likely the site of injection. 
There is mild splenomegaly with spleen measuring 15.8 cm on axial images. No 
splenic FDG activity is found. No enlarged or FDG avid lymph nodes are detected. 
There is no skeletal FDG avidity. 
Incidental findings on the low-dose CT. Bilateral fat-containing inguinal 
hernias are present. Prostate is mildly to moderately enlarged. There appear to 
be several small dependent calculi within the urinary bladder. There is 
potentially a small nonobstructing RIGHT renal calculus. Upper urinary tracts 
are decompressed. Right renal cysts are present. Chronic diverticular disease is 
in the distal colon. There is mildly excessive stool in the colon. Mild coronary 
artery calcifications are noted.
IMPRESSION: Mild splenomegaly. No concerning FDG avid lesions. Specifically no splenic, bone 
marrow or lymph node FDG avidity.

## 2019-11-06 IMAGING — CT CTA CORONARY
3 series · 14 of 20 positions shown, 16 images · IV contrast (ISOVUE 300)
Comparison: There are no previous exams available for comparison.

CTA CORONARY, 11/06/2019 [DATE]: 
CLINICAL INDICATION: Atherosclerosis. 
A search for DICOM formatted images was conducted for prior CT imaging studies 
completed at a non-affiliated media free facility.
TECHNIQUE: The region of interest was scanned with contrast on a high resolution 
low dose CT scanner. 100 cc's of Isovue 370 was injected intravenously.  3-D 
renderings were reconstructed on an independent rotation. 
Images were performed and reconstructed using ECG gating. To reduce the risk of 
nephrotoxicity and to adequately monitor the patient, the patient was monitored 
for over 30 minutes post procedure with 250 mL of normal saline administered 
intravenously.

[Series 5: corcta 0.75 i40s 2 bestdiast 63% · axial · 0.44mm/px · z∈[-204,-142]mm · 3 of 308 slices shown]
[im 77/308  vessel]
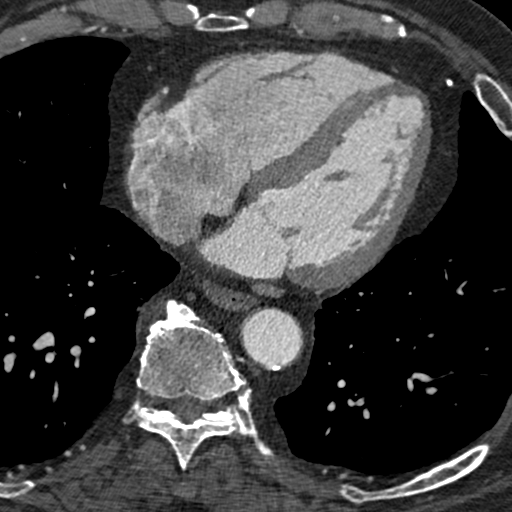
[im 154/308  vessel]
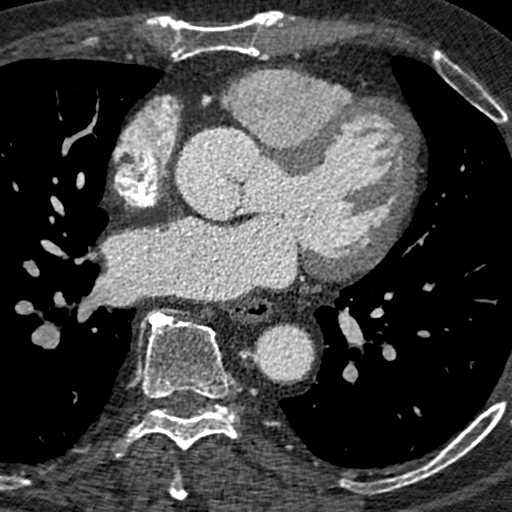
[im 231/308  vessel]
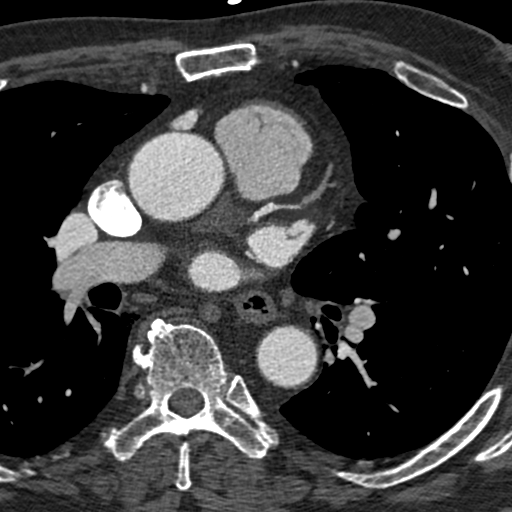

[Series 6: corcta 0.75 i40s 2 bestsyst 27% · axial · 0.44mm/px · z∈[-204,-142]mm · 3 of 308 slices shown]
[im 77/308  vessel]
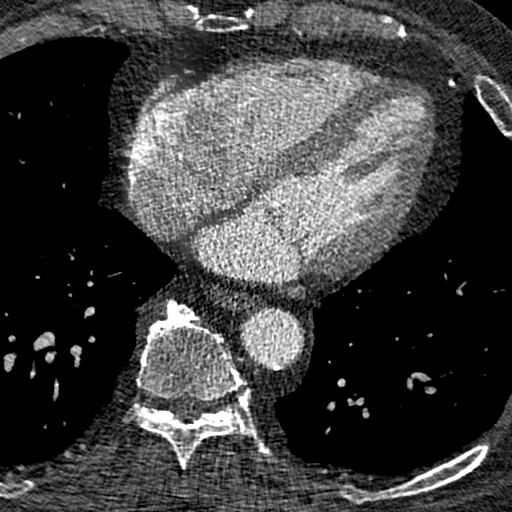
[im 154/308  vessel]
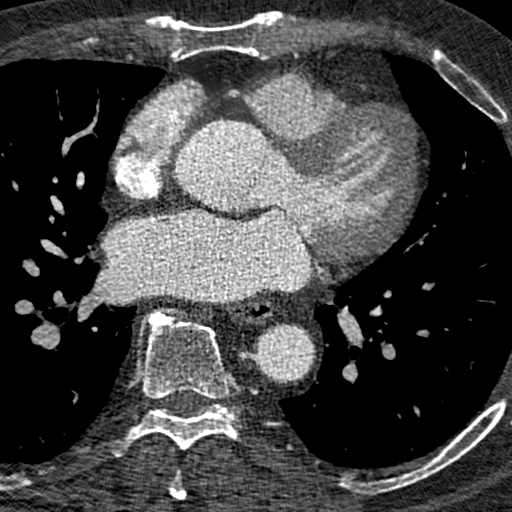
[im 231/308  vessel]
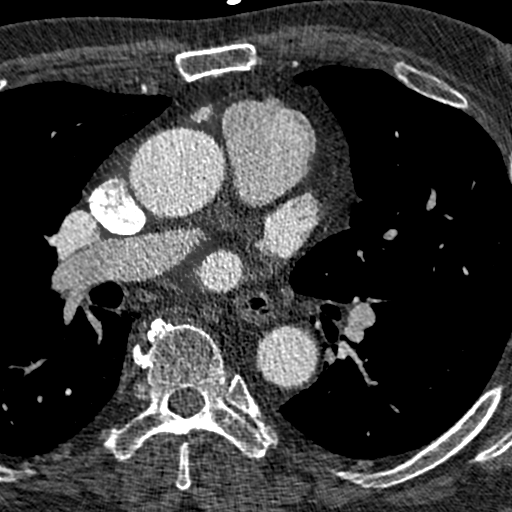

[Series 7: function 0-90% · axial · 0.44mm/px · z∈[-222,-124]mm · 8 of 620 slices shown, 10 images]
[im 69/620  vessel]
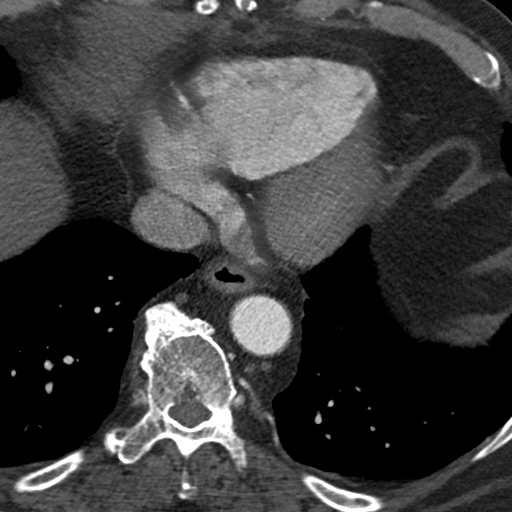
[im 69/620  lung]
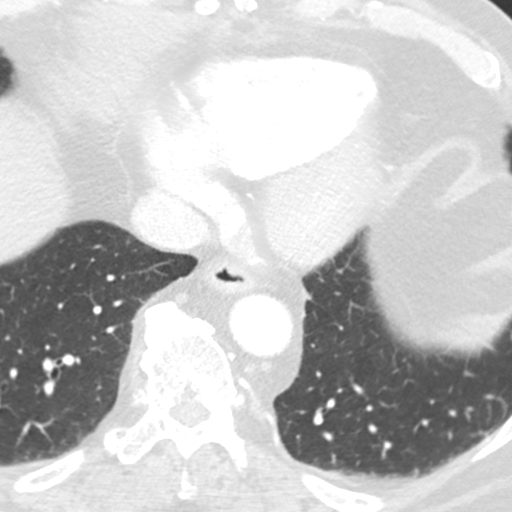
[im 138/620  vessel]
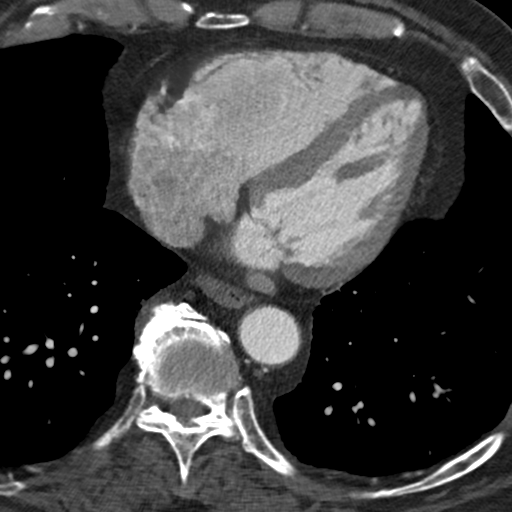
[im 207/620  vessel]
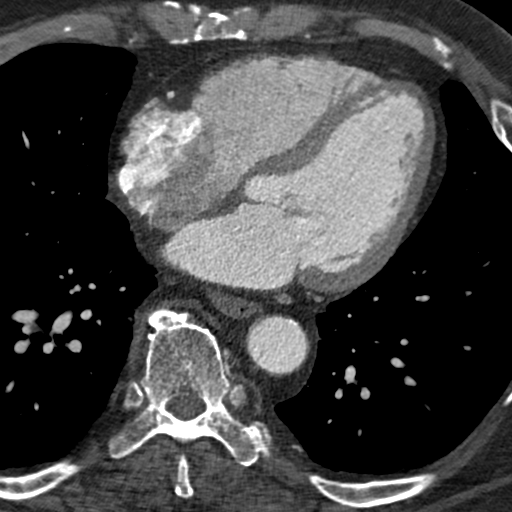
[im 276/620  vessel]
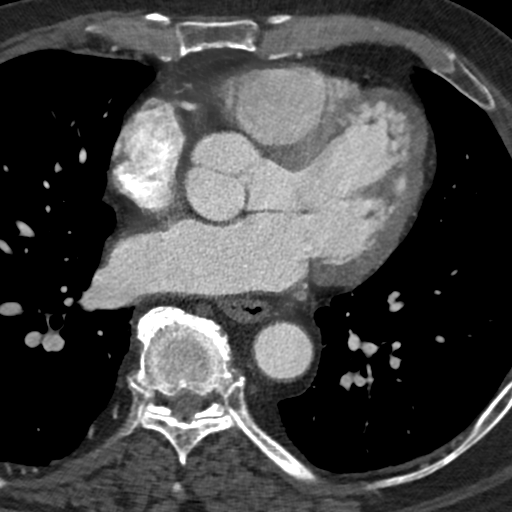
[im 344/620  vessel]
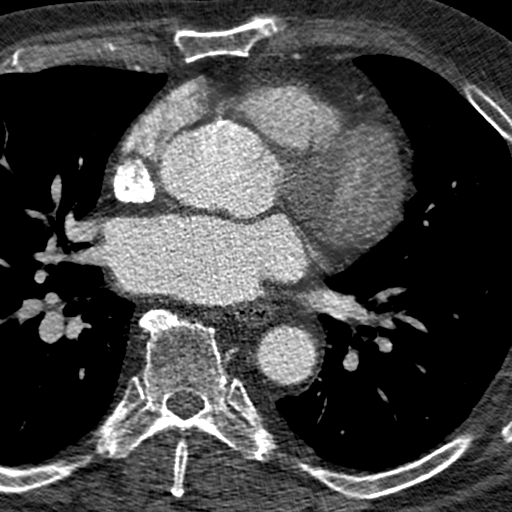
[im 344/620  lung]
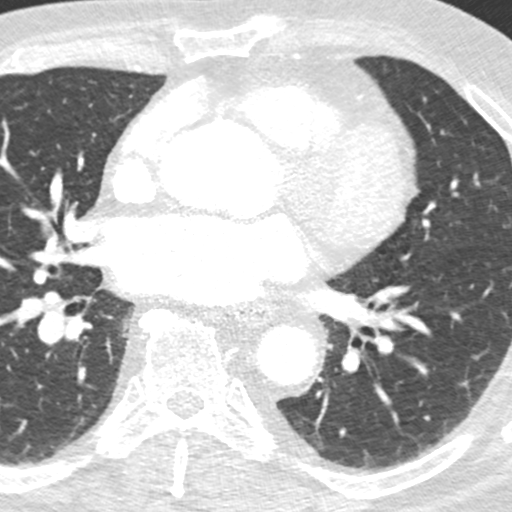
[im 413/620  vessel]
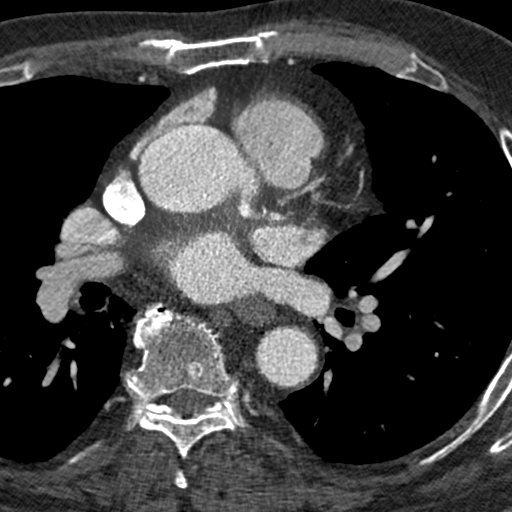
[im 482/620  vessel]
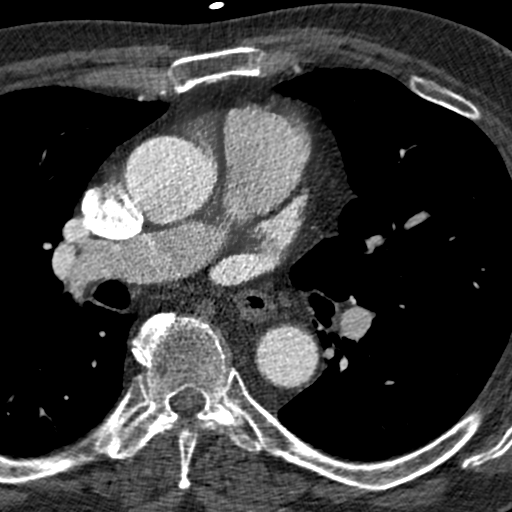
[im 551/620  vessel]
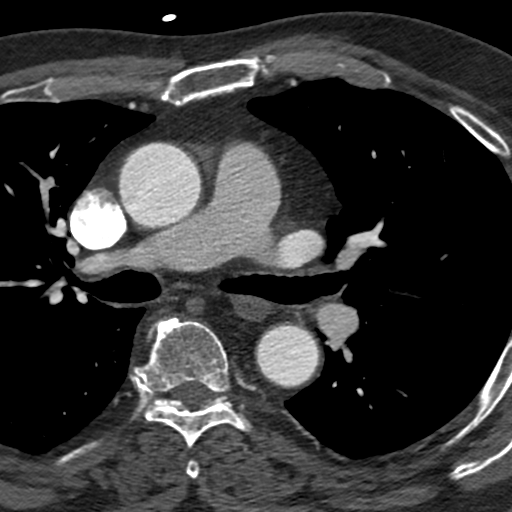

[14 of 20 positions shown; findings below may reference images not displayed]

FINDINGS: The surrounding lungs are clear. Degenerative changes seen in the 
spine. No mass or thrombus seen within the cardiac chambers. 
The left main coronary artery is widely patent. 
Extrinsic calcific plaque left anterior descending artery proximally. However, 
no stenosis even approaching 30-40% seen. 
Circumflex artery also has calcific plaque proximally without evidence of 
significant stenosis. Circumflex artery is a small vessel. 
Right coronary artery has plaque at its origin without stenosis. The posterior 
descending artery arises from the right coronary circulation.
IMPRESSION: Extrinsic plaque involving the proximal left anterior descending artery and to a 
lesser degree circumflex and right coronary arteries. However, no significant 
stenosis seen. 
Degenerative changes seen in the spine. 
RADIATION DOSE REDUCTION: All CT scans are performed using radiation dose 
reduction techniques, when applicable.  Technical factors are evaluated and 
adjusted to ensure appropriate moderation of exposure.  Automated dose 
management technology is applied to adjust the radiation doses to minimize 
exposure while achieving diagnostic quality images.

## 2020-10-09 ENCOUNTER — Emergency Department (HOSPITAL_COMMUNITY): Payer: Medicare Other | Admitting: Radiology

## 2020-10-09 ENCOUNTER — Emergency Department
Admission: EM | Admit: 2020-10-09 | Discharge: 2020-10-09 | Disposition: A | Discharge status: Home / Self Care | Payer: Medicare Other | Attending: Emergency Medicine | Admitting: Emergency Medicine

## 2020-10-09 ENCOUNTER — Emergency Department (HOSPITAL_COMMUNITY): Payer: Medicare Other

## 2020-10-09 ENCOUNTER — Other Ambulatory Visit: Payer: Self-pay

## 2020-10-09 DIAGNOSIS — M25473 Effusion, unspecified ankle: Secondary | ICD-10-CM

## 2020-10-09 DIAGNOSIS — L03116 Cellulitis of left lower limb: Secondary | ICD-10-CM

## 2020-10-09 DIAGNOSIS — M79605 Pain in left leg: Secondary | ICD-10-CM | POA: Insufficient documentation

## 2020-10-09 DIAGNOSIS — Z7901 Long term (current) use of anticoagulants: Secondary | ICD-10-CM | POA: Insufficient documentation

## 2020-10-09 DIAGNOSIS — Z96652 Presence of left artificial knee joint: Secondary | ICD-10-CM | POA: Insufficient documentation

## 2020-10-09 DIAGNOSIS — L309 Dermatitis, unspecified: Secondary | ICD-10-CM

## 2020-10-09 DIAGNOSIS — M79606 Pain in leg, unspecified: Secondary | ICD-10-CM

## 2020-10-09 DIAGNOSIS — Z86718 Personal history of other venous thrombosis and embolism: Secondary | ICD-10-CM | POA: Insufficient documentation

## 2020-10-09 DIAGNOSIS — M25472 Effusion, left ankle: Secondary | ICD-10-CM

## 2020-10-09 DIAGNOSIS — L0390 Cellulitis, unspecified: Secondary | ICD-10-CM

## 2020-10-09 LAB — CBC WITH DIFF
BASOPHIL #: <0.1 10*3/uL (ref <=0.20)
BASOPHIL %: 0 %
EOSINOPHIL #: 0.17 10*3/uL (ref <=0.50)
EOSINOPHIL %: 2 %
HCT: 41.7 % (ref 38.9–52.0)
HGB: 14.2 g/dL (ref 13.4–17.5)
IMMATURE GRANULOCYTE #: <0.1 10*3/uL (ref <0.10)
IMMATURE GRANULOCYTE %: 0 % (ref 0–1)
LYMPHOCYTE #: 0.71 10*3/uL — ABNORMAL LOW (ref 1.00–4.80)
LYMPHOCYTE %: 10 %
MCH: 34 pg — ABNORMAL HIGH (ref 26.0–32.0)
MCHC: 34.1 g/dL (ref 31.0–35.5)
MCV: 99.8 fL (ref 78.0–100.0)
MONOCYTE #: 0.67 10*3/uL (ref 0.20–1.10)
MONOCYTE %: 10 %
MPV: 9.4 fL (ref 8.7–12.5)
NEUTROPHIL #: 5.46 10*3/uL (ref 1.50–7.70)
NEUTROPHIL %: 78 %
PLATELETS: 116 10*3/uL — ABNORMAL LOW (ref 150–400)
RBC: 4.18 10*6/uL — ABNORMAL LOW (ref 4.50–6.10)
RDW-CV: 14.9 % (ref 11.5–15.5)
WBC: 7.1 10*3/uL (ref 3.7–11.0)

## 2020-10-09 LAB — COMPREHENSIVE METABOLIC PANEL, NON-FASTING
ALBUMIN: 3.7 g/dL (ref 3.4–4.8)
ALKALINE PHOSPHATASE: 96 U/L (ref 45–115)
ALT (SGPT): 23 U/L (ref 10–55)
ANION GAP: 11 mmol/L (ref 4–13)
AST (SGOT): 28 U/L (ref 8–45)
BILIRUBIN TOTAL: 2.1 mg/dL — ABNORMAL HIGH (ref 0.3–1.3)
BUN/CREA RATIO: 19 (ref 6–22)
BUN: 16 mg/dL (ref 8–25)
CALCIUM: 9.3 mg/dL (ref 8.8–10.2)
CHLORIDE: 104 mmol/L (ref 96–111)
CO2 TOTAL: 26 mmol/L (ref 23–31)
CREATININE: 0.84 mg/dL (ref 0.75–1.35)
ESTIMATED GFR: 87 mL/min/BSA (ref >=60)
GLUCOSE: 91 mg/dL (ref 65–125)
POTASSIUM: 4.3 mmol/L (ref 3.5–5.1)
PROTEIN TOTAL: 6.4 g/dL (ref 6.0–8.0)
SODIUM: 141 mmol/L (ref 136–145)

## 2020-10-09 LAB — PT/INR
INR: 4.13 (ref <=5.00)
PROTHROMBIN TIME: 48 seconds — ABNORMAL HIGH (ref 9.7–13.6)

## 2020-10-09 LAB — PTT (PARTIAL THROMBOPLASTIN TIME): APTT: 50.7 seconds — ABNORMAL HIGH (ref 26.0–39.0)

## 2020-10-09 MED ORDER — SODIUM CHLORIDE 0.9 % (FLUSH) INJECTION SYRINGE
3.0000 mL | INJECTION | INTRAMUSCULAR | Status: DC | PRN
Start: 2020-10-09 — End: 2020-10-09

## 2020-10-09 MED ORDER — CLINDAMYCIN 900 MG/50 ML IN 0.9% SODIUM CHLORIDE INTRAVENOUS PIGGYBACK
900.0000 mg | INJECTION | Freq: Three times a day (TID) | INTRAVENOUS | Status: DC
Start: 2020-10-09 — End: 2020-10-09
  Administered 2020-10-09: 0 mg via INTRAVENOUS
  Administered 2020-10-09: 900 mg via INTRAVENOUS
  Filled 2020-10-09 (×4): qty 50

## 2020-10-09 MED ORDER — TRIAMCINOLONE ACETONIDE 0.1 % TOPICAL OINTMENT
TOPICAL_OINTMENT | Freq: Two times a day (BID) | CUTANEOUS | 0 refills | Status: AC
Start: 2020-10-09 — End: ?

## 2020-10-09 MED ORDER — SODIUM CHLORIDE 0.9 % (FLUSH) INJECTION SYRINGE
3.0000 mL | INJECTION | Freq: Three times a day (TID) | INTRAMUSCULAR | Status: DC
Start: 2020-10-09 — End: 2020-10-09

## 2020-10-09 MED ORDER — KETOROLAC 30 MG/ML (1 ML) INJECTION SOLUTION
15.0000 mg | INTRAMUSCULAR | Status: AC
Start: 2020-10-09 — End: 2020-10-09
  Administered 2020-10-09: 15 mg via INTRAVENOUS
  Filled 2020-10-09: qty 1

## 2020-10-09 MED ORDER — TRAMADOL 50 MG TABLET
1.0000 | ORAL_TABLET | Freq: Four times a day (QID) | ORAL | 0 refills | Status: DC | PRN
Start: 2020-10-09 — End: 2022-09-01

## 2020-10-09 MED ORDER — CLINDAMYCIN HCL 300 MG CAPSULE
300.0000 mg | ORAL_CAPSULE | Freq: Four times a day (QID) | ORAL | 0 refills | Status: AC
Start: 2020-10-09 — End: 2020-10-19

## 2020-10-09 NOTE — ED Nurses Note (Signed)
Pt states his left lower leg has been swollen for a few days but today he woke up with left lower leg pain near his ankle. Pt states "I think I have a blood clot because I have had them before." Pt placed in a gown and on the cardiac monitor at this time. No distress noted. Call light in reach.

## 2020-10-09 NOTE — ED Triage Notes (Signed)
Pt with complaints of left leg pain x 4-5 days, history of DVT.

## 2020-10-09 NOTE — ED Nurses Note (Signed)
Safety check performed. Pt resting quietly. No needs at this time. No distress noted. Call light in reach. Will continue to monitor.

## 2020-10-09 NOTE — ED Provider Notes (Signed)
Emergency Department  Provider Note  HPI - 10/09/2020  No results found for: COVID19, COVID19PCR, COVID19RES, COVID19QP, CORONA, SARSCOV2RNA    Name: Gary White  Age and Gender: 75 y.o. male  Attending: Dr. Lorita Officer  Provider: Jacqulyn Cane, PA-C  Scribe: Dahlia Client Day, SCRIBE      HPI:  Gary White is a 75 y.o. male who presents to the ED today for leg swelling. Pt reports that his L calf became noticeably swollen several days ago. He states that his L ankle became moderately tender at approximately 0400 this AM, which is why he is being seen today. This pain is exacerbates with walking and certain movements. 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 His last INR was 2.2 on Tuesday 10/06/20. While in the ED, pt also notes an itchy rash he has developed over the past week to his BLE, arms, chest, and back. Notes additional hx of neuropathy, but is not experiencing any increased numbness or tingling from baseline. Denies fever, SOB, chest pain, or any further sxs at this time.    Location: LLE  Quality: Swelling  Onset: Several days  Severity: Moderate  Timing: Ongoing  Context: Pt has new L leg swelling  Modifying factors: Pain exacerbates with walking and movement  Associated symptoms: +LLE swelling, L ankle pain, itchy rash to BLE, arms, chest, and back                                     -SOB, chest pain, increased numb/tingling, fever    History provided by: Patient     Review of Systems:   Constitutional: No fever or chills.   Skin: +Itchy rash to BLE, arms, chest, and back.   HENT: No sore throat, ear pain, or difficulty swallowing.  Eyes: No vision changes, redness, or discharge.   Cardio: No chest pain or palpitations.  Respiratory: No cough, wheezing, or SOB.  GI: No nausea or vomiting. No diarrhea or constipation. No abdominal pain.  GU: No dysuria,  hematuria, or polyuria.  MSK: +LLE swelling, L ankle pain. No neck or back pain.  Neuro: No numbness, tingling, or weakness. No headache.  All other systems reviewed and are negative, unless commented on in the HPI.       Below information reviewed with patient:   Current Outpatient Medications   Medication Sig   . clindamycin (CLEOCIN) 300 mg Oral Capsule Take 1 Capsule (300 mg total) by mouth Four times a day for 10 days   . traMADoL (ULTRAM) 50 mg Oral Tablet Take 1 Tablet (50 mg total) by mouth Every 6 hours as needed for Pain   . triamcinolone acetonide (ARISTOCORT A) 0.1 % Ointment Apply topically Twice daily     No Known Allergies  Past Medical History:  No past medical history on file.  Past Surgical History:  No past surgical history on file.  Social History:     Social History     Substance and Sexual Activity   Drug Use Not on file     Family History:  No family history on file.    Old records reviewed.  Objective:  Nursing notes reviewed    Filed Vitals:    10/09/20 1415  10/09/20 1510 10/09/20 1530 10/09/20 1545   BP: (!) 144/75 136/80 (!) 143/86 (!) 147/83   Pulse: 80 79 79 72   Resp: 18 19 13 12    Temp:       SpO2: 100% 100% 99% 100%     Physical Exam  Nursing note and vitals reviewed. Vital signs reviewed as above.   Constitutional: Pt is well-developed and well-nourished.   Head: Normocephalic and atraumatic.   Eyes: Conjunctivae are normal. Pupils are equal, round, and reactive to light. EOM are intact.  Neck: Soft and supple with full range of motion.  Cardiovascular: RRR. No murmurs/rubs/gallops. Distal pulses present and equal bilaterally.  Pulmonary/Chest: Normal BS BL with no distress. No audible wheezes or crackles are noted.  Abdominal: Soft, nontender, and nondistended. No rebound, guarding, or masses.  Musculoskeletal: +Edema and tenderness to L calf, which is markedly larger than R calf. Edema and tenderness to L foot and ankle. Normal range of motion. No deformities.   Neurological: CNs  2-12 grossly intact. No focal deficits noted.  Skin: +Multiple scabbed lesions on BLE, and mild amount of erythema surrounding scabbed lesion on lateral L ankle. Warm and dry.  Psychiatric: Patient has a normal mood and affect.     Work-up:  Orders Placed This Encounter   . XR ANKLE LEFT   . XR TIBIA-FIBULA LEFT   . CBC/DIFF   . COMPREHENSIVE METABOLIC PANEL, NON-FASTING   . PT/INR   . PTT (PARTIAL THROMBOPLASTIN TIME)   . CBC WITH DIFF   . INSERT & MAINTAIN PERIPHERAL IV ACCESS   . PERIPHERAL IV DRESSING CHANGE   . PERIPHERAL VENOUS DUPLEX - LOWER   . NS flush syringe   . NS flush syringe   . clindamycin (CLEOCIN) 900 mg in NS 50 mL premix IVPB   . ketorolac (TORADOL) 30 mg/mL injection   . traMADoL (ULTRAM) 50 mg Oral Tablet   . clindamycin (CLEOCIN) 300 mg Oral Capsule   . triamcinolone acetonide (ARISTOCORT A) 0.1 % Ointment      Labs:  Results for orders placed or performed during the hospital encounter of 10/09/20 (from the past 24 hour(s))   CBC/DIFF    Narrative    The following orders were created for panel order CBC/DIFF.  Procedure                               Abnormality         Status                     ---------                               -----------         ------                     CBC WITH DIFF[[id number]]                Abnormal            Final result                 Please view results for these tests on the individual orders.   COMPREHENSIVE METABOLIC PANEL, NON-FASTING   Result Value Ref Range    SODIUM 141 136 - 145 mmol/L    POTASSIUM 4.3 3.5 - 5.1 mmol/L  CHLORIDE 104 96 - 111 mmol/L    CO2 TOTAL 26 23 - 31 mmol/L    ANION GAP 11 4 - 13 mmol/L    BUN 16 8 - 25 mg/dL    CREATININE 0.980.84 1.190.75 - 1.35 mg/dL    BUN/CREA RATIO 19 6 - 22    ESTIMATED GFR 87 >=60 mL/min/BSA    ALBUMIN 3.7 3.4 - 4.8 g/dL     CALCIUM 9.3 8.8 - 14.710.2 mg/dL    GLUCOSE 91 65 - 829125 mg/dL    ALKALINE PHOSPHATASE 96 45 - 115 U/L    ALT (SGPT) 23 10 - 55 U/L    AST (SGOT)  28 8 - 45 U/L    BILIRUBIN TOTAL 2.1 (H) 0.3 -  1.3 mg/dL    PROTEIN TOTAL 6.4 6.0 - 8.0 g/dL   PT/INR   Result Value Ref Range    PROTHROMBIN TIME 48.0 (H) 9.7 - 13.6 seconds    INR 4.13 <=5.00   PTT (PARTIAL THROMBOPLASTIN TIME)   Result Value Ref Range    APTT 50.7 (H) 26.0 - 39.0 seconds   CBC WITH DIFF   Result Value Ref Range    WBC 7.1 3.7 - 11.0 x10^3/uL    RBC 4.18 (L) 4.50 - 6.10 x10^6/uL    HGB 14.2 13.4 - 17.5 g/dL    HCT 56.241.7 13.038.9 - 86.552.0 %    MCV 99.8 78.0 - 100.0 fL    MCH 34.0 (H) 26.0 - 32.0 pg    MCHC 34.1 31.0 - 35.5 g/dL    RDW-CV 78.414.9 69.611.5 - 29.515.5 %    PLATELETS 116 (L) 150 - 400 x10^3/uL    MPV 9.4 8.7 - 12.5 fL    NEUTROPHIL % 78 %    LYMPHOCYTE % 10 %    MONOCYTE % 10 %    EOSINOPHIL % 2 %    BASOPHIL % 0 %    NEUTROPHIL # 5.46 1.50 - 7.70 x10^3/uL    LYMPHOCYTE # 0.71 (L) 1.00 - 4.80 x10^3/uL    MONOCYTE # 0.67 0.20 - 1.10 x10^3/uL    EOSINOPHIL # 0.17 <=0.50 x10^3/uL    BASOPHIL # <0.10 <=0.20 x10^3/uL    IMMATURE GRANULOCYTE % 0 0 - 1 %    IMMATURE GRANULOCYTE # <0.10 <0.10 x10^3/uL     Abnormal Lab results:  Labs Reviewed   COMPREHENSIVE METABOLIC PANEL, NON-FASTING - Abnormal; Notable for the following components:       Result Value    BILIRUBIN TOTAL 2.1 (*)     All other components within normal limits   PT/INR - Abnormal; Notable for the following components:    PROTHROMBIN TIME 48.0 (*)     All other components within normal limits   PTT (PARTIAL THROMBOPLASTIN TIME) - Abnormal; Notable for the following components:    APTT 50.7 (*)     All other components within normal limits   CBC WITH DIFF - Abnormal; Notable for the following components:    RBC 4.18 (*)     MCH 34.0 (*)     PLATELETS 116 (*)     LYMPHOCYTE # 0.71 (*)     All other components within normal limits   CBC/DIFF    Narrative:     The following orders were created for panel order CBC/DIFF.  Procedure  Abnormality         Status                     ---------                               -----------         ------                     CBC  WITH DIFF[[id number]]                Abnormal            Final result                 Please view results for these tests on the individual orders.     Imaging:  Results for orders placed or performed during the hospital encounter of 10/09/20 (from the past 72 hour(s))   XR ANKLE LEFT     Status: None    Narrative    Male, 75 years old.    XR ANKLE LEFT performed on 10/09/2020 12:48 PM.    REASON FOR EXAM:  atraumatic swelling and pain    TECHNIQUE: 3 views/3 images submitted for interpretation.    COMPARISON:  None available.      Impression    FINDINGS/IMPRESSION:  No acute fracture.  Bidirectional calcaneal enthesophytes.  Ankle mortise is preserved.  Vascular calcifications.      Radiologist location ID: [id number]     XR TIBIA-FIBULA LEFT     Status: None    Narrative    Male, 75 years old.    XR TIBIA- FIBULA LEFT 2 VIEWS performed on 10/09/2020 12:48 PM.    REASON FOR EXAM:  atraumatic swelling and pain    TECHNIQUE: 2 views/2 images submitted for interpretation.    COMPARISON:  None    FINDINGS:  AP lateral imaging of the left lower leg was performed.    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 Extensive arterial vascular calcifications are noted.    No fracture or focal area of abnormal bone architecture is seen in the left tibia or fibula.      Impression    1. Unremarkable plain film radiograph partial left tibia and fibula.  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  3. Extensive arterial vascular calcifications.      Radiologist location ID: [id number]       Narrative & Impression   EXAM: PERIPHERAL VENOUS DUPLEX - LOWER    HISTORY: M25.473: Swelling of ankle    Spectral analysis, grayscale, and color flow imaging of the venous system of the left lower extremity is performed. Normal flow is seen within the common femoral, superficial femoral, and popliteal veins. These show good augmentation and compressibility. There is no evidence for deep venous thrombosis. The upper calf veins, if  visualized, appear normal.    IMPRESSION:  1. There is no evidence for deep venous thrombosis within the left lower extremity.       Radiologist location ID: [id number]     Plan: Appropriate labs and imaging ordered. Medical Records reviewed.      MDM:   . During the patient's stay in the emergency department, the above listed imaging and/or labs were performed to assist with medical decision making and were reviewed by myself when available for review.   . Pt remained stable throughout the emergency  department course.    . Pt was seen by the attending physician, Dr. Lorita Officer, at bedside while in the ED.   ED Course as of 10/09/20 1609   Fri Oct 09, 2020   1231 Patient was seen in conjunction with mid-level.  He does have swelling of his left lower extremity from the knee to the ankle.  He does have an erythematous sort of rashes well.  He has a history of blood clots but is anticoagulated with Coumadin.  He does have a palpable +2 pulse in the dorsalis pedis distribution of bilateral lower extremities.  His compartments are soft at this time.  Plan will be for ultrasound, x-ray, blood work. [NC]   1554 Pt was re-assessed, reports continued pain to right ankle which is worse on ambulation. Pt continues to deny CP or SOB.  Results and plan and need for follow up were discussed. Plan to begin abx for presumed cellulitis with repeat DVT study in one week. Low threshold for return if worsening or any other new/worsening concerning symptoms. Pt indicated understanding and was agreeable. Questions sought and answered.   [KD]      ED Course User Index  [KD] Raynelle Fanning, PA-C  [NC] Concha Norway, DO     Impression:   Diagnoses     Diagnosis Comment Added By Time Added    Swelling of ankle  Raynelle Fanning, PA-C 10/09/2020 12:08 PM    Leg pain  Raynelle Fanning, PA-C 10/09/2020  4:02 PM    Cellulitis, unspecified cellulitis site  Raynelle Fanning, PA-C 10/09/2020  4:02 PM     Dermatitis  Raynelle Fanning, PA-C 10/09/2020  4:03 PM        Disposition:  Discharged   Medication instructions were discussed with the patient.   It was advised that the patient return to the ED with any new, concerning or worsening symptoms and follow up as directed.    The patient verbalized understanding of all instructions and had no further questions or concerns.     Follow up:   System, Pcp Not In            Prescriptions:   New Prescriptions    CLINDAMYCIN (CLEOCIN) 300 MG ORAL CAPSULE    Take 1 Capsule (300 mg total) by mouth Four times a day for 10 days    TRAMADOL (ULTRAM) 50 MG ORAL TABLET    Take 1 Tablet (50 mg total) by mouth Every 6 hours as needed for Pain    TRIAMCINOLONE ACETONIDE (ARISTOCORT A) 0.1 % OINTMENT    Apply topically Twice daily       Pain Management:  A comprehensive pain management plan for the patient was developed and discussed with the patient. The patient's previous experience with non-opioid and opioid medications was reviewed. The patient's experience with non-pharmacological/alternative therapies for pain management was reviewed. The patient's history in regard to substance abuse was reviewed. Information from the Controlled Substance Monitoring Program was accessed ([employer]arxCheck).    It was determined that an opioid prescription medication is a necessary and appropriate part of the patient's comprehensive pain management plan. The patient was advised they would be receiving a prescription for Tramadol at discharge. The patient was advised they could fill the prescription for a lesser amount if they chose. They were advised if they did choose to partially fill the prescription they might not be able to get an addition opioid prescription for several  days if they ran out of medication.    The patient was given an opportunity to ask questions about the comprehensive pain management plan and questions were answered.           I am scribing for, and in the presence of,  Jacqulyn Cane, PA-C for services provided on 10/09/2020.  Dahlia Client Day, SCRIBE     Hannah Day, SCRIBE  10/09/2020, 12:45    I personally performed the services described in this documentation, as scribed in my presence, and it is both accurate and complete.   Raynelle Fanning, [place]    The co-signing faculty was physically present in the emergency department and available for consultation and DID participate in the care of this patient.  [address] [place], [place]  10/17/2020, 11:42    Concha Norway, DO  10/28/2020, 12:01

## 2021-03-03 IMAGING — NM THREE PHASE BONE SCAN
3 series · 12 of 12 positions shown · non-contrast
Comparison: 02/10/2021 [facility] radiographs and [facility] 06/17/2019 PET/CT

________________________________________________________________________________________________ 
THREE PHASE BONE SCAN, 03/03/2021 [DATE]: 
CLINICAL INDICATION: Right knee loosening prosthesis. Right knee tenderness 
since surgery in 7004.
TECHNIQUE: After the intravenous administration of 27.2 mCi of ATHALIA Fc55m MDP, 
blood flow, blood pool and delayed images were obtained over the knees. In 
addition whole-body images were obtained.

[Series 1000: bone statics · 4.80mm/px · 4 acquisitions, 8 frames shown]
[im 1/4]
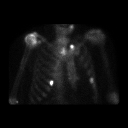
[im 1/4]
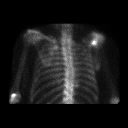
[im 2/4]
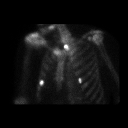
[im 2/4]
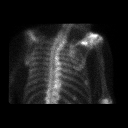
[im 3/4]
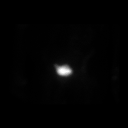
[im 3/4]
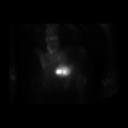
[im 4/4]
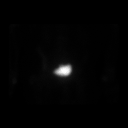
[im 4/4]
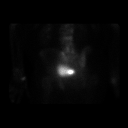

[Series 1000: wholebody · 2.40mm/px · 2 of 2 frames shown]
[frame 1/2]
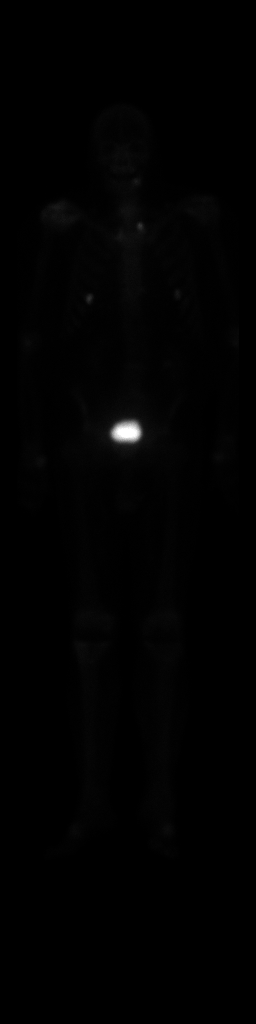
[frame 2/2]
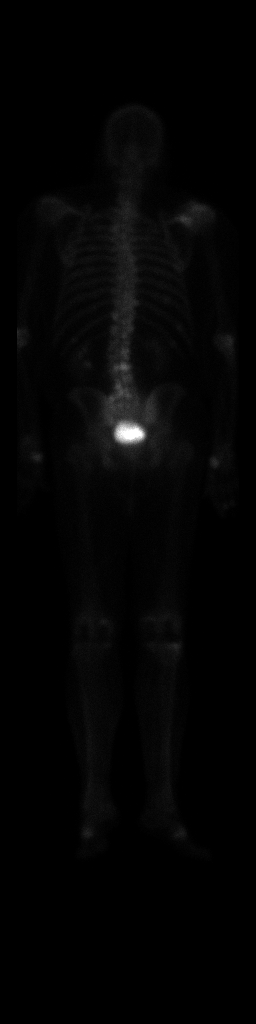

[Series 1001: delays · 4.80mm/px · 2 of 2 frames shown]
[frame 1/2]
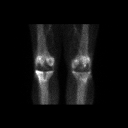
[frame 2/2]
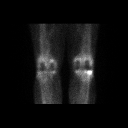

[12 of 12 positions shown; findings below may reference images not displayed]

FINDINGS: Physiologic uptake of radiotracer. Photopenic defects in the knees 
corresponds to cemented total knee arthroplasties. No abnormal uptake on flow 
imaging. Mild nonspecific increased radiotracer uptake about the right lateral 
tibial component on immediate static and delayed imaging. Normal uptake about 
the left TKA. 
Increased radiotracer uptake in the left mandible may be due to periodontal 
disease. Small focus of indeterminate, marked radiotracer uptake superior to the 
left sternoclavicular joint. Mild diffuse radiotracer uptake throughout the 
right shoulder, consistent with degenerative change. Marked radiotracer uptake 
within the bilateral fifth costochondral junctions, greater on the right, and 
mild uptake within several posterior ribs: Findings are likely posttraumatic. 
Increased radiotracer uptake in the lumbar spine, consistent with degenerative 
change. Mild thoracolumbar curve. Mild radiotracer uptake in the hands and feet, 
consistent with degenerative change.
IMPRESSION: Mild nonspecific increased radiotracer uptake about the right lateral tibial 
component of the cemented TKA.

## 2021-03-27 HISTORY — PX: HX CERVICAL LAMINECTOMY: SHX94

## 2021-04-07 IMAGING — MR MRI THORACIC SPINE WITHOUT CONTRAST
4 of 8 series · 17 of 48 positions shown · IV contrast (gadolinium)
Comparison: MRI lumbar spine April 07, 2021 and head CT June 17, 2019.

________________________________________________________________________________________________ 
MRI THORACIC SPINE WITHOUT CONTRAST, 04/07/2021 [DATE]: 
CLINICAL INDICATION: 75-year-old male with low back pain. Thoracic pain and 
numbness. Radiculopathy into the legs. Recent falls. No spine surgery.
TECHNIQUE: Multiplanar, multiecho position MR images of the thoracic spine were 
performed without intravenous gadolinium enhancement.   Patient was scanned on a 
1.5 Tesla magnet.

[Series 6: T2 · coronal · 5.0mm · 0.64mm/px · 4 of 17 slices shown (1 of 3)]
[im 1/17]
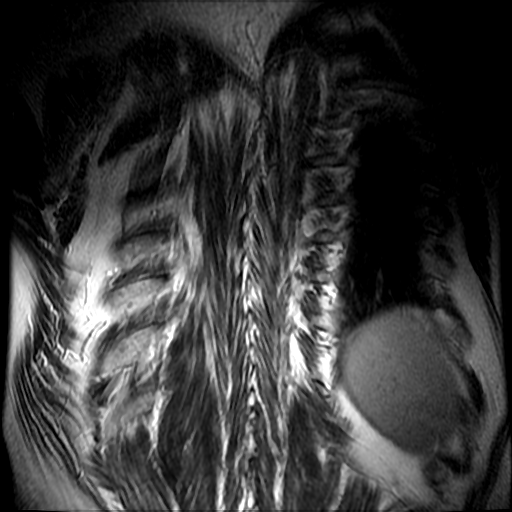
[im 6/17]
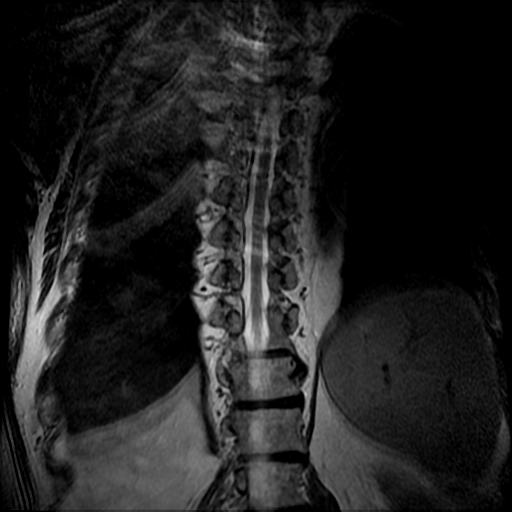
[im 11/17]
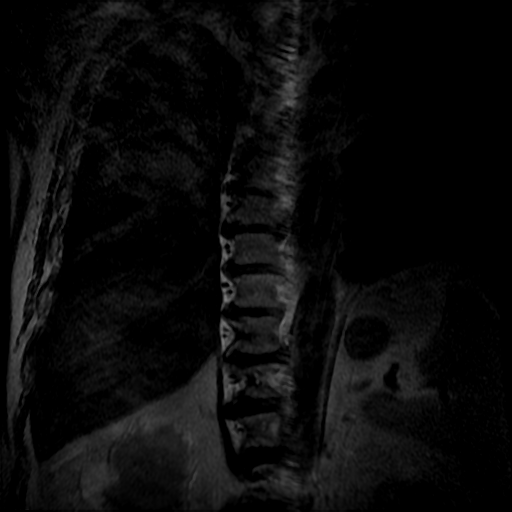
[im 17/17]
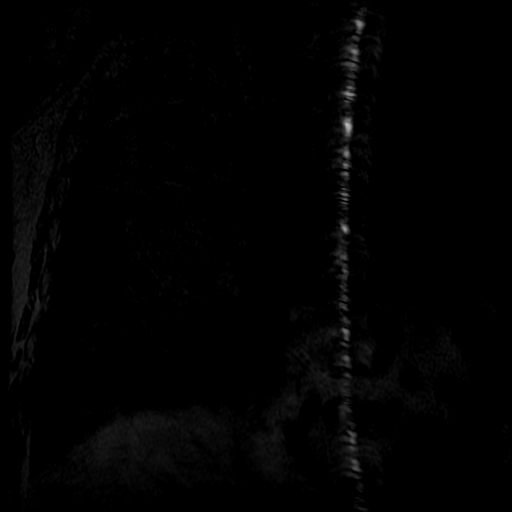

[Series 8: T1 · sagittal · 4.0mm · 0.68mm/px · 3 of 17 slices shown]
[im 1/17]
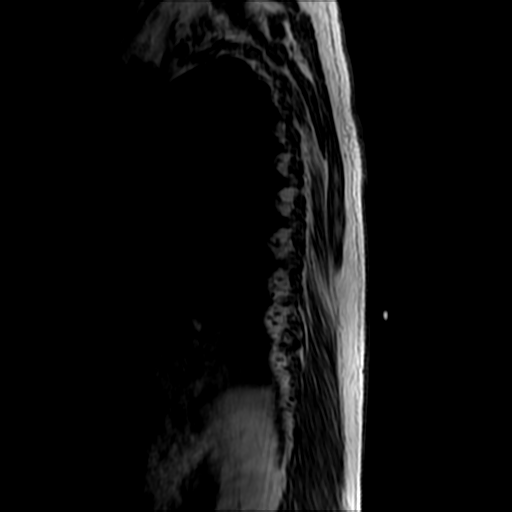
[im 11/17]
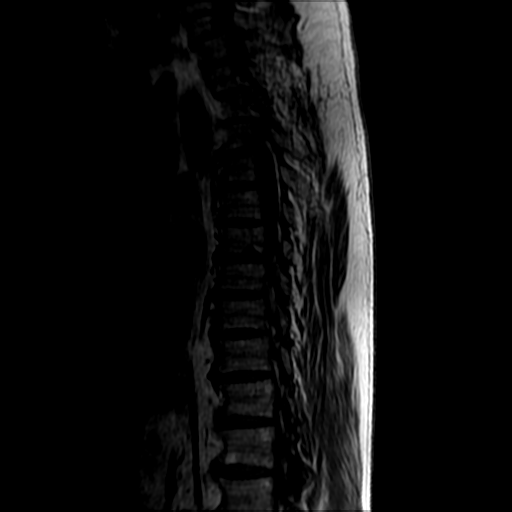
[im 17/17]
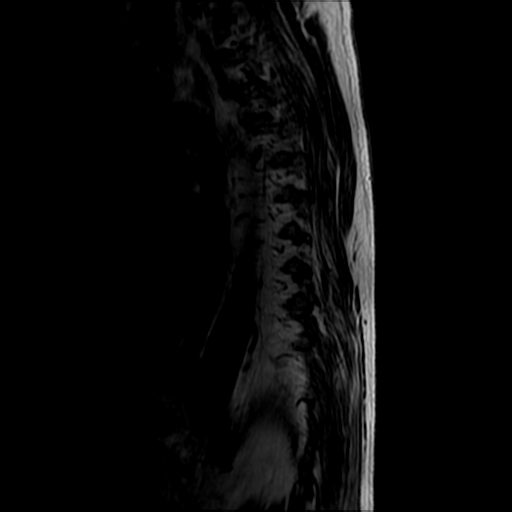

[Series 10: T2 · sagittal · 4.0mm · 0.68mm/px · 4 of 17 slices shown (2 of 3)]
[im 1/17]
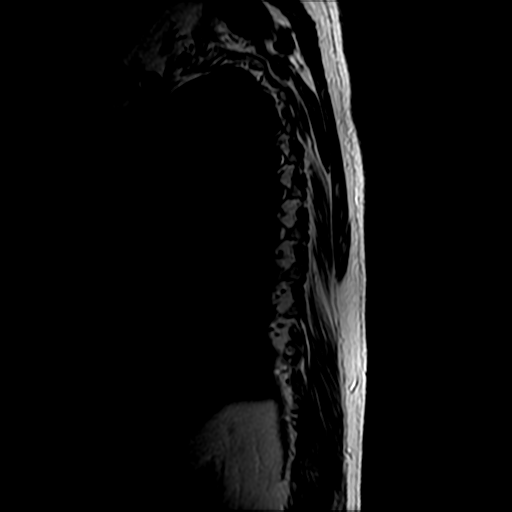
[im 6/17]
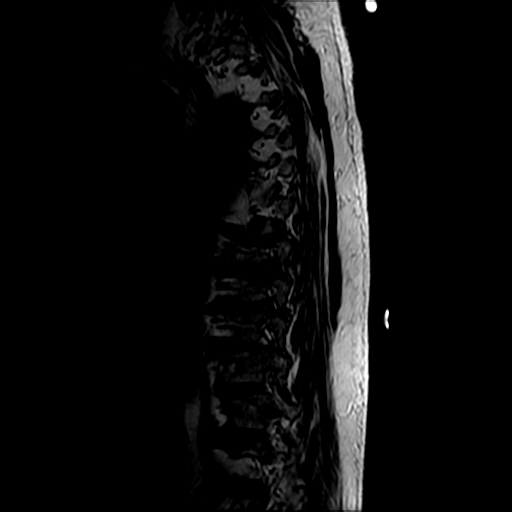
[im 11/17]
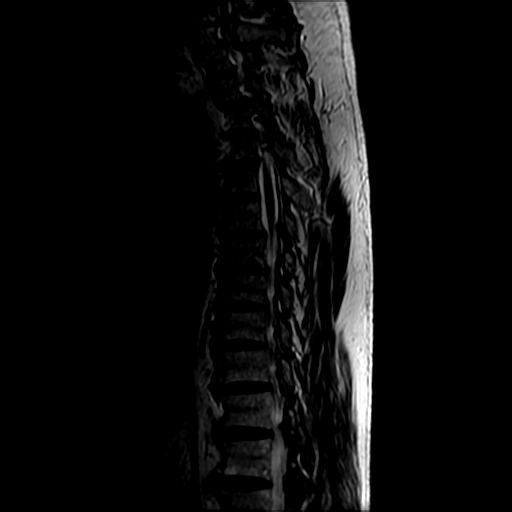
[im 17/17]
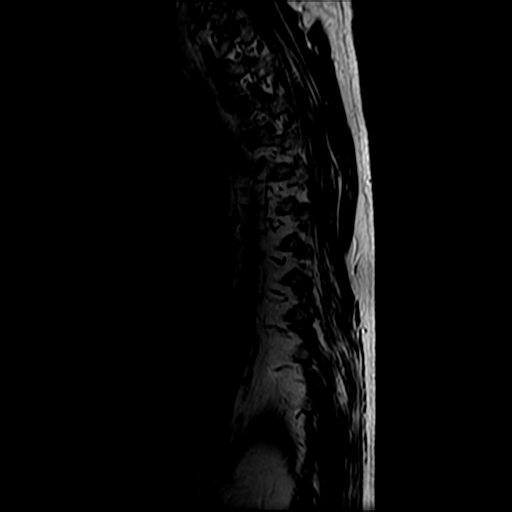

[Series 11: T2 · axial · 4.5mm · 0.35mm/px · z∈[-293,-47]mm · 6 of 72 slices shown (3 of 3)]
[im 4/72]
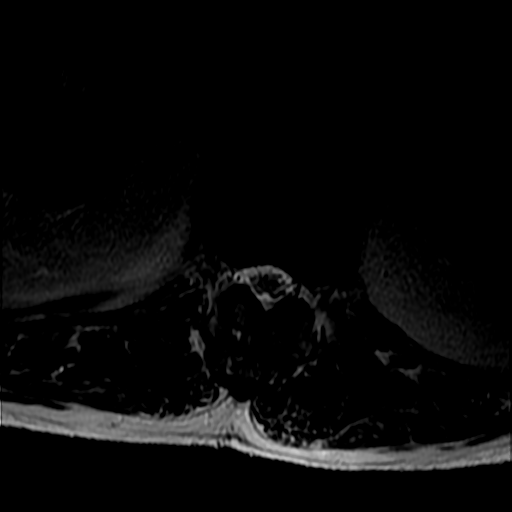
[im 12/72]
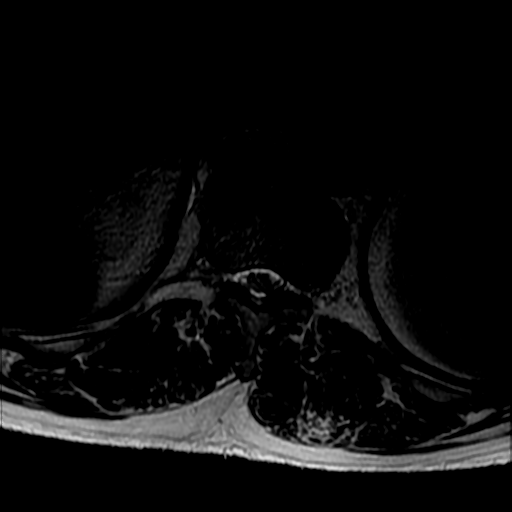
[im 20/72]
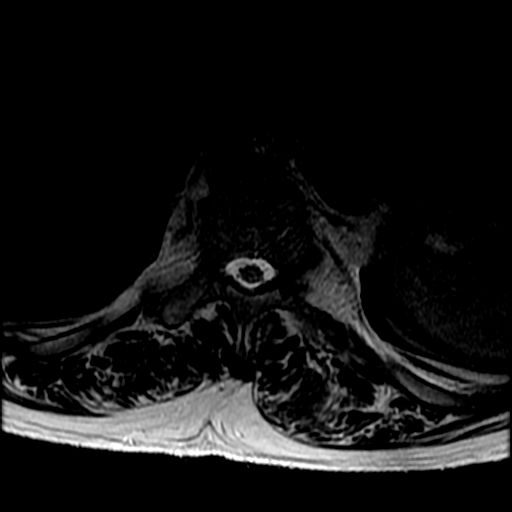
[im 32/72]
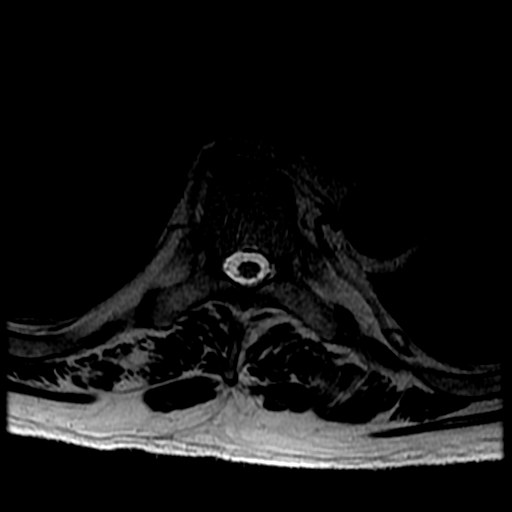
[im 36/72]
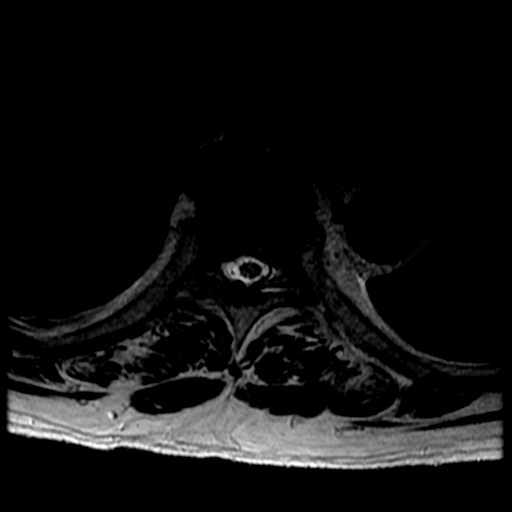
[im 60/72]
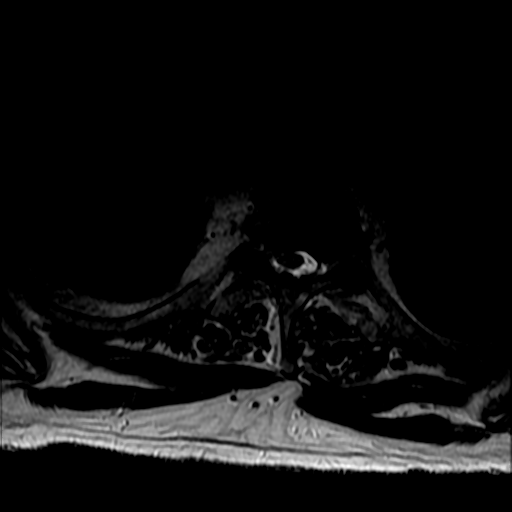

[17 of 48 positions shown; findings below may reference images not displayed]

FINDINGS: Scout localizer demonstrates posterior localization marker at the C6 
level. There is another posterior localization marker at the T9 level. 
Localization images demonstrate multilevel cervical degenerative changes with 
mild degrees of canal stenosis throughout the cervical spine. However, there is 
at least moderate stenosis at C7-T1 associated with 4 mm anterolisthesis C7 on 
T1. There is also focal cord signal abnormality related to compressive 
myelopathy at this level. There are 12 thoracic and 5 lumbar type vertebral 
bodies. Mild smooth appearing dextroconvex thoracic scoliosis measures
degrees, as measured from T5 level through the L1 level. Apex to the right at 
T9-T10. There is loss of the normal thoracic kyphosis with straightening 
thoracic alignment. 
VERTEBRA: Vertebral body height preserved. Hemangioma T12.   No fractures or 
marrow replacing lesions. At C7-T1, there is disc unroofing related to 
anterolisthesis and moderate to moderately severe canal stenosis. There is 
suggestion for abnormal cord signal consistent with compressive myelopathy with 
significant ligament flavum hypertrophy at this level. Abnormal cord signal 
appears to extend inferiorly to the T1-T2 level particularly involving the 
central cord. There is mild canal stenosis T2-T3. Small right paracentral disc 
protrusion T6-T7 but without significant central canal narrowing. 
At nearly every other level, there are small generalized annular disc bulges 
associated with ligamentum flavum hypertrophy, but without significant central 
canal stenosis or other abnormal cord signal. Multilevel facet arthropathy. 
DISCS: Multilevel degenerative disc disease. No convincing evidence of discitis. 
SPINAL CORD: Bilateral cord is normal in caliber with abnormal cord signal as 
detailed above. No other areas of abnormal cord signal. The conus ends at the L1 
level. 
OTHER: Localization views also demonstrate degenerative changes of the lumbar 
spine which were detailed on dedicated MR lumbar spine. Please see that report. 
No paraspinous abnormality.
IMPRESSION: Localization images demonstrate multilevel cervical degenerative changes. There 
is moderate canal stenosis at C7-T1 related to anterolisthesis and posterior 
ligamentous hypertrophy with resulting compressive myelopathy and abnormal cord 
signal from C7-T1 extending inferiorly to the T1-T2 level consistent with cord 
edema. 
Mild canal stenosis T2-T3. 
Otherwise there are multiple levels of mild annular bulges but without other 
significant canal stenosis or other regions of abnormal cord signal. Given the 
findings at the cervical spine, consider dedicated MR cervical spine. 
Other degenerative and scoliotic changes as detailed above.

## 2021-04-07 IMAGING — MR MRI LUMBAR SPINE WITHOUT CONTRAST
4 of 7 series · 17 of 48 positions shown · IV contrast (gadolinium)
Comparison: MR thoracic spine April 07, 2021 CT PET study June 17, 2019. 
CT abdomen May 27, 2019.

________________________________________________________________________________________________ 
MRI LUMBAR SPINE WITHOUT CONTRAST, 04/07/2021 [DATE]: 
CLINICAL INDICATION: 75-year-old male with low back pain. Recent falls. 
Radiculopathy into the legs.
TECHNIQUE: Multiplanar, multiecho position MR images of the lumbar spine were 
performed without intravenous gadolinium enhancement. Patient was scanned on a 
1.5T magnet.

[Series 12: T2 · coronal · 5.0mm · 0.55mm/px · 5 of 15 slices shown (1 of 3)]
[im 1/15]
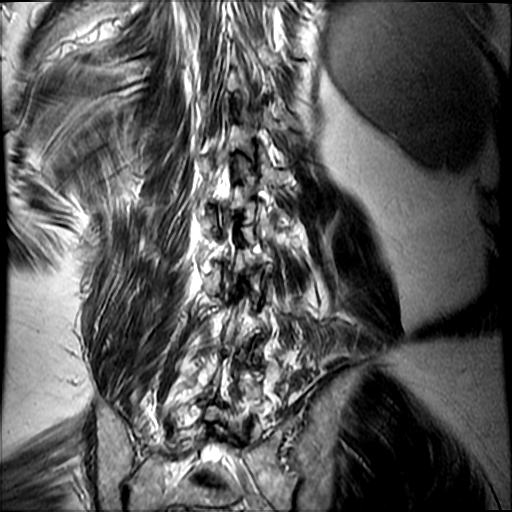
[im 4/15]
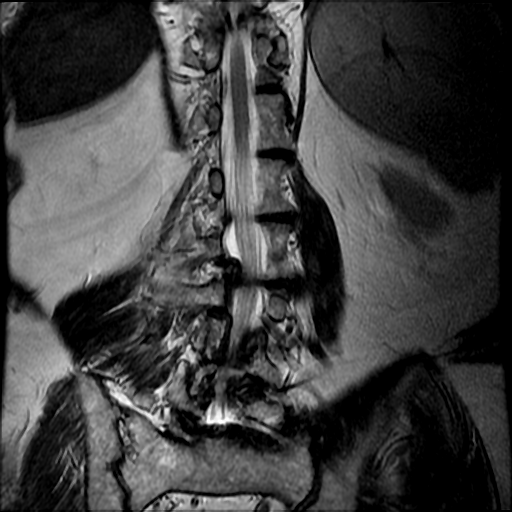
[im 8/15]
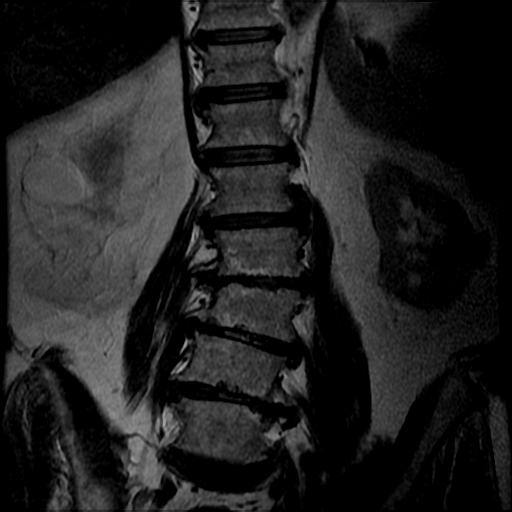
[im 11/15]
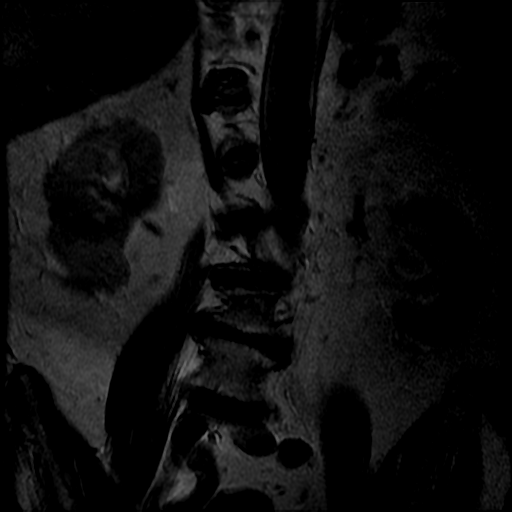
[im 15/15]
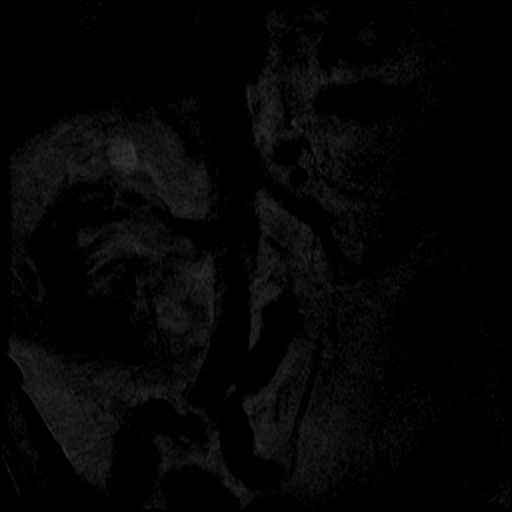

[Series 13: T2 · sagittal · 5.0mm · 0.51mm/px · 6 of 17 slices shown (2 of 3)]
[im 1/17]
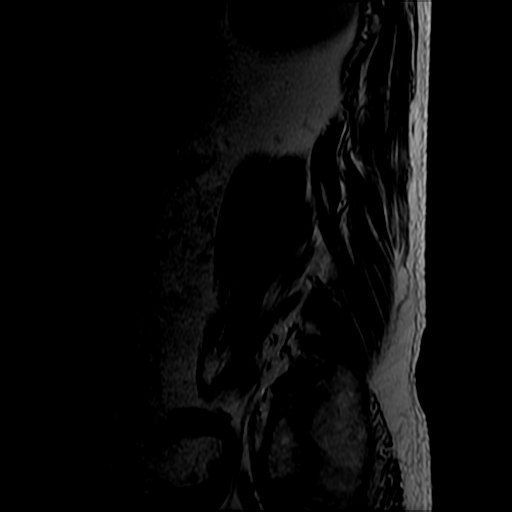
[im 4/17]
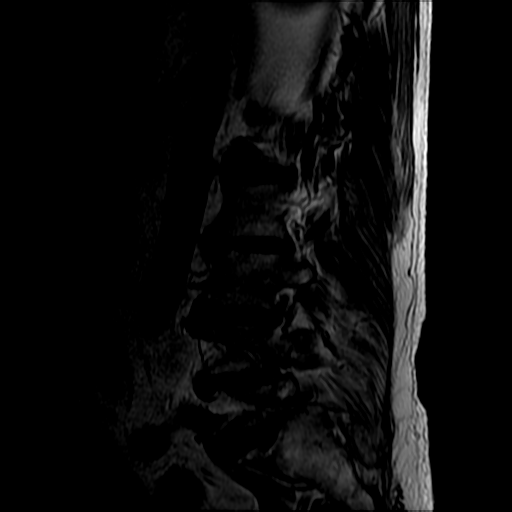
[im 7/17]
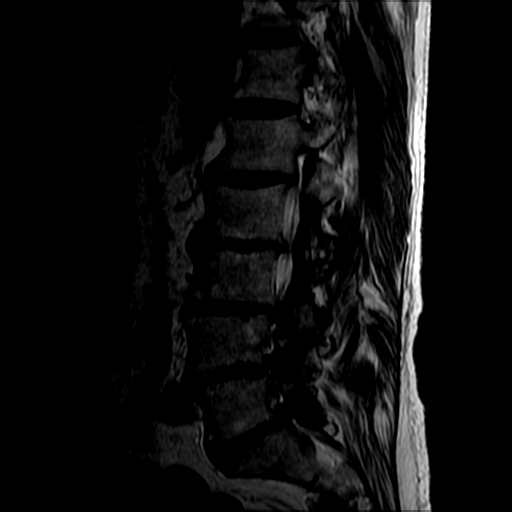
[im 10/17]
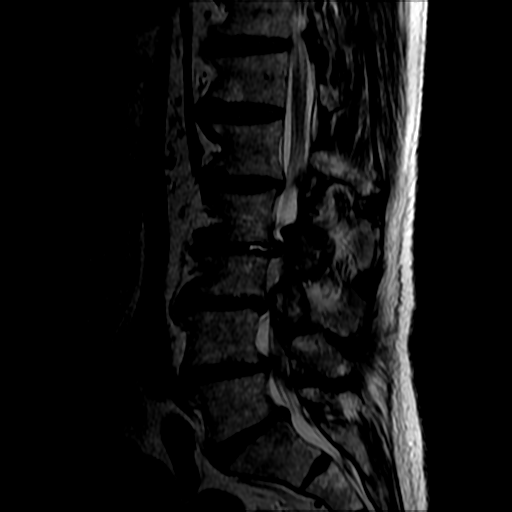
[im 13/17]
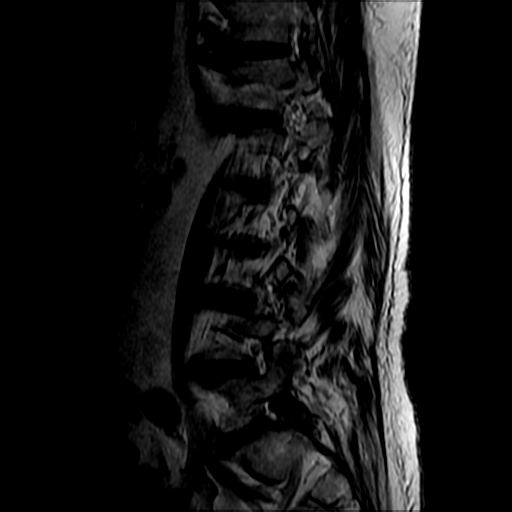
[im 17/17]
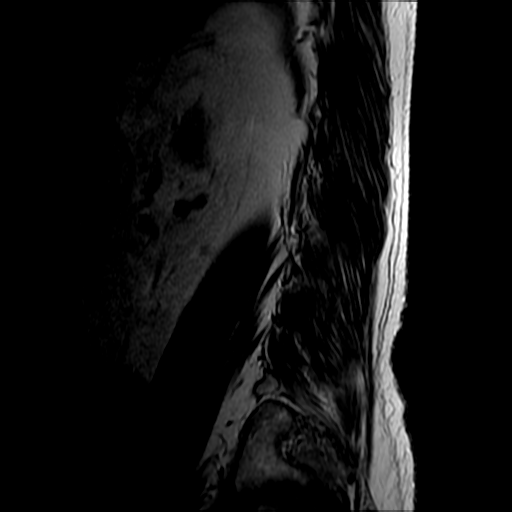

[Series 14: T1 · sagittal · 5.0mm · 0.51mm/px · 3 of 17 slices shown]
[im 4/17]
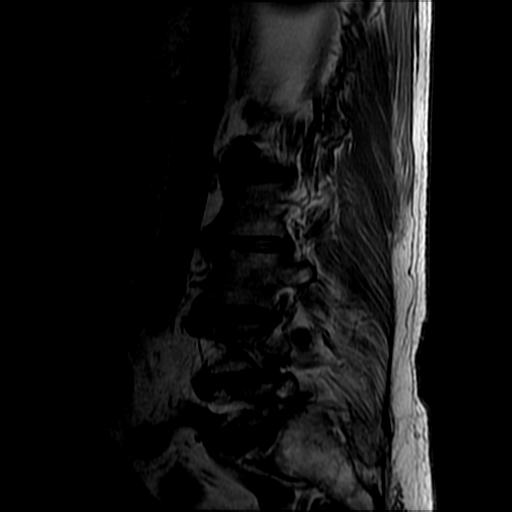
[im 10/17]
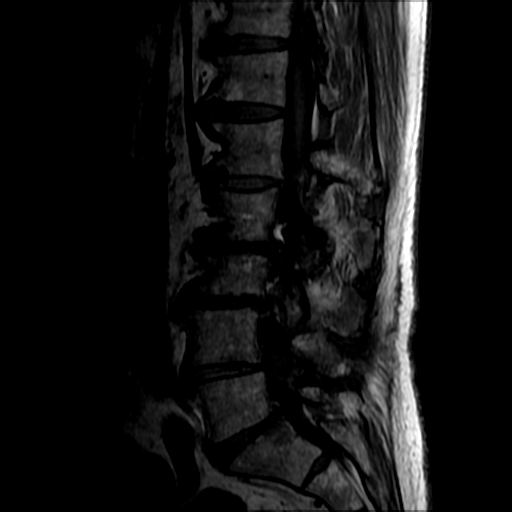
[im 17/17]
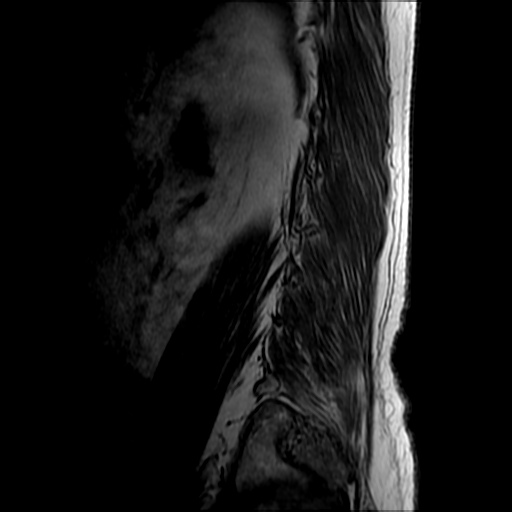

[Series 16: T2 · oblique · 4.0mm · 0.35mm/px · 3 of 30 slices shown (3 of 3)]
[im 4/30]
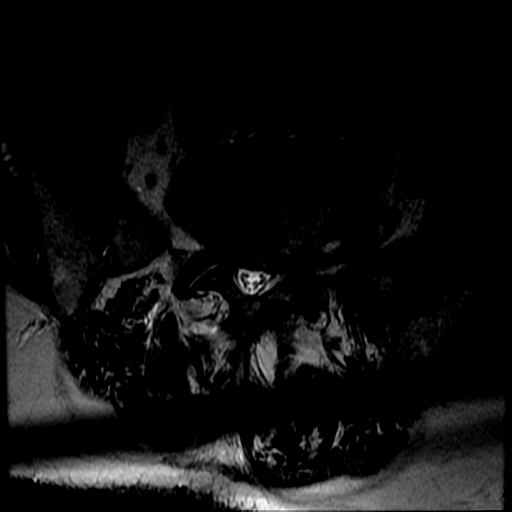
[im 17/30]
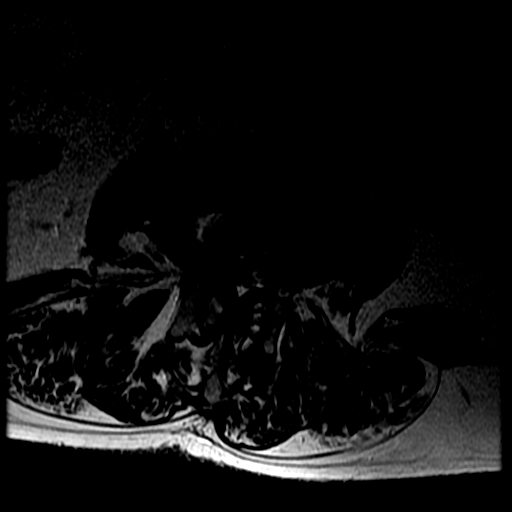
[im 26/30]
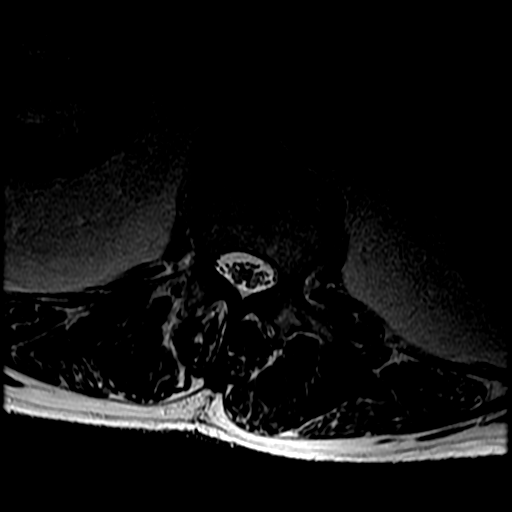

[17 of 48 positions shown; findings below may reference images not displayed]

FINDINGS: 5 lumbar type vertebral bodies. The levoconvex thoracic lumbar 
scoliosis measures 19.4 degrees measured from T12 through L4 level. Apex to the 
left at L2-L3. 2 mm retrolisthesis L1 on L2, L2 on L3, with 3 mm retrolisthesis 
L3 on L4. 2 mm anterolisthesis L4 on L5 and L5 on S1. Vertebral body height 
preserved without fracture. Extensively degenerated left L4-L5 facet. It would 
be difficult to exclude a left-sided L4 pars defect. No marrow or ligamentous 
edema. Conus ends at the L1 level. Distal cord is unremarkable. Other than 
compression, the cauda equina appears unremarkable. No paraspinous abnormality 
other than a right renal cyst. 
Modic I-II: L2-L3 
Ligamentum Flavum > 2.5 mm: All levels 
T11-T12: Loss of disc signal. Mild canal stenosis largely from posterior 
ligamentous hypertrophy. Mild/moderate left foraminal narrowing. Right neural 
foramen is patent. Facet arthropathy. 
T12-L1: Normal disc height and signal with patent canal and foramina. Bilateral 
mild facet arthropathy. 
L1-L2: Mild loss of disc height with slight loss of disc signal. There is mild 
to moderate canal stenosis from small size generalized annular bulge. There is 
thinning of the left ligamentum flavum. Correlate for any history of laminectomy 
in this region. Extensive bilateral facet arthropathy. Left neural foramen is 
patent. Moderate right foraminal narrowing. Small right-sided facet synovial 
effusion. 
L2-L3: Moderate to severe loss of disc height specifically to the right. Loss of 
disc signal. Annular fissure within the right paracentral zone associated with a 
right paracentral, foraminal, and far lateral disc protrusion with moderate 
right foraminal narrowing. Moderate canal stenosis with compression of the cauda 
equina nerve roots. Extensive facet arthropathy and ligamentum flavum 
hypertrophy. Left neural foramen patent. 
L3-L4: Moderate loss of disc height specifically to the right with loss of disc 
signal. Posterior osteophytic ridging, generalized annular bulge, ligamentum 
flavum hypertrophy lead to moderate to severe canal stenosis with compression of 
the cauda equina nerve roots. Extensive facet arthropathy with ligamentum flavum 
hypertrophy, moderate right and small left facet synovial effusion. Moderate 
right foraminal narrowing. Left neural foramen is mildly narrowed. 
L4-L5: Moderate to severe loss of disc height specifically to the left with loss 
of disc signal, severe canal stenosis, extensive bilateral facet arthropathy 
with moderate bilateral facet synovial effusions. Left foraminal disc extrusion 
with severe left foraminal narrowing. Mild right foraminal narrowing. 
L5-S1: Moderate loss of disc height with loss of disc signal. Central disc 
extrusion extends into the left paracentral zone and left foraminal zone with 
moderate left foraminal narrowing. Mild to moderate canal stenosis with lateral 
recess stenosis bilaterally. Right neural foramen is also moderately narrowed. 
Facet arthropathy. There is a left-sided facet synovial cyst which projects 
within the posterior soft tissues and measures 6 mm.
IMPRESSION: Extensive degenerative and scoliotic changes throughout the lower thoracic and 
lumbar spine. 
Most significant findings consist of significant canal stenosis and compression 
of the cauda equina nerve roots. Mild canal stenosis T11-T12. Mild to moderate 
canal stenosis L1-L2, moderate canal stenosis L2-L3, moderate to severe canal 
stenosis L3-L4, severe canal stenosis L4-L5 and mild-to-moderate canal stenosis 
L5-S1. 
Significant, multilevel foraminal narrowing as detailed above.

## 2021-06-14 IMAGING — MR MRI CERVICAL SPINE W/WO CONTRAST
6 of 9 series · 18 of 48 positions shown · IV contrast (gadavist)
Comparison: Thoracic MRI April 07, 2021

________________________________________________________________________________________________ 
MRI CERVICAL SPINE W/WO CONTRAST, 06/14/2021 [DATE]: 
CLINICAL INDICATION: Spondylosis with myelopathy, fell March 12, 2021
TECHNIQUE: Sagittal T1, Sagittal T2, Sagittal STIR, Axial TSE, Axial [id number], 
Enhanced Sagittal T1 with fat suppression, and Enhanced Axial T1 MR images of 
the cervical spine were performed with and without intravenous enhancement.  10 
ccs of Gadavist is injected intravenously by hand. The patients eGFR was 
calculated to be 73.0 mL/min/1.73 m2 using the i-STAT device.

[Series 101: survey* · axial · 10.0mm · 1.56mm/px · z∈[-30,+199]mm · 3 of 15 slices shown]
[im 1/15]
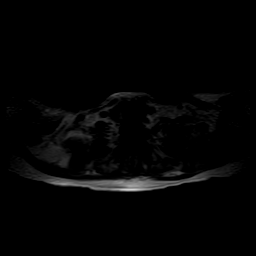
[im 8/15]
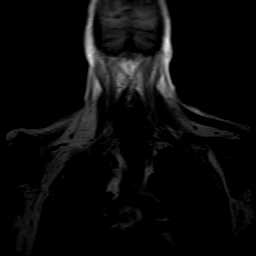
[im 15/15]
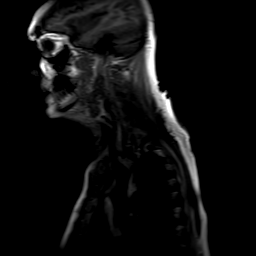

[Series 201: t2w_cor-surv · coronal · 5.0mm · 0.85mm/px · 2 of 7 slices shown]
[im 1/7]
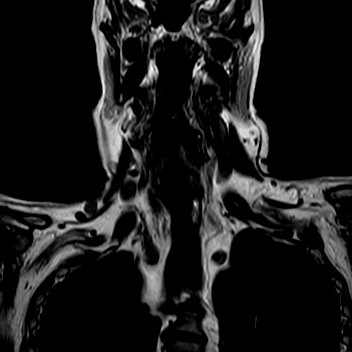
[im 7/7]
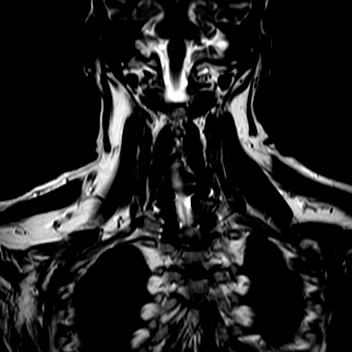

[Series 301: t2w_mv_xd_sag · sagittal · 3.0mm · 0.31mm/px · 4 of 15 slices shown]
[im 1/15]
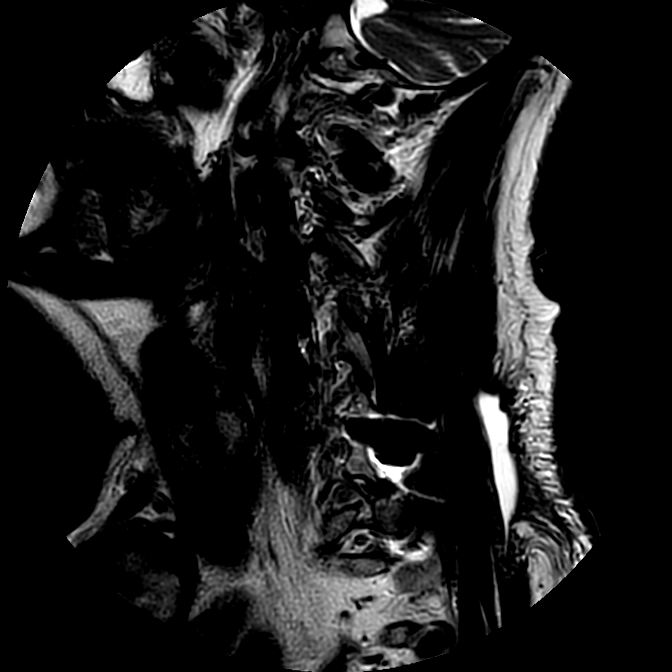
[im 5/15]
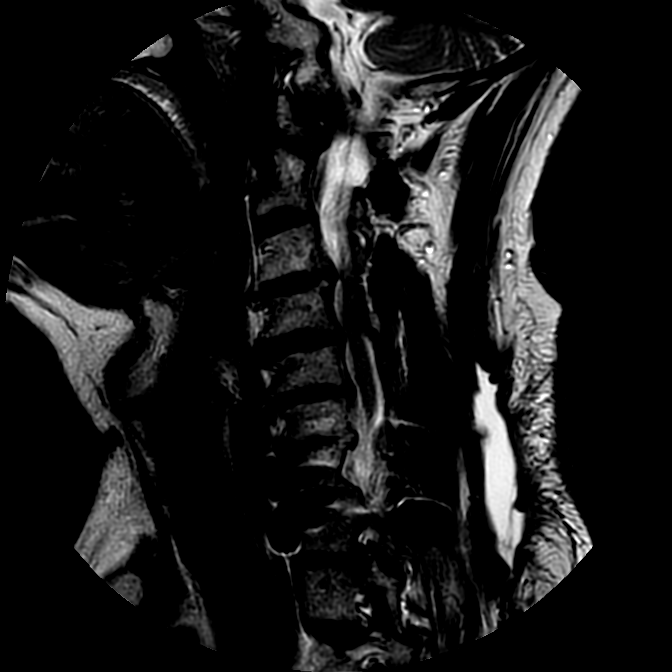
[im 10/15]
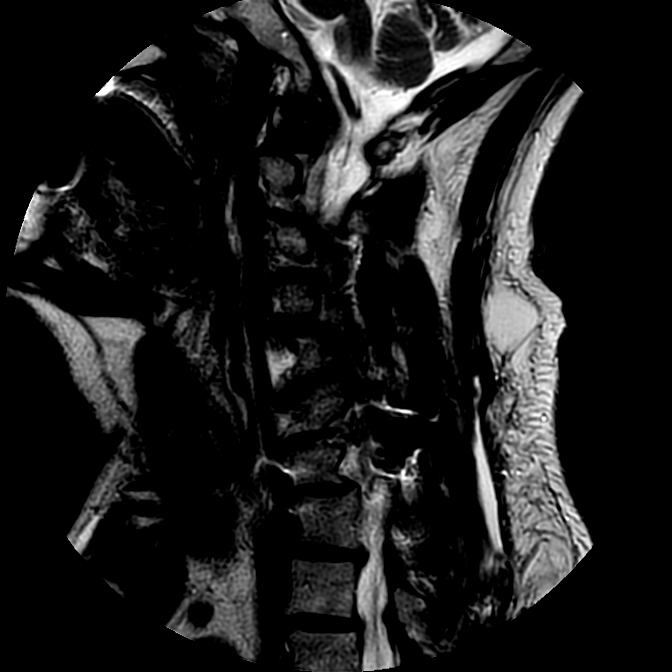
[im 15/15]
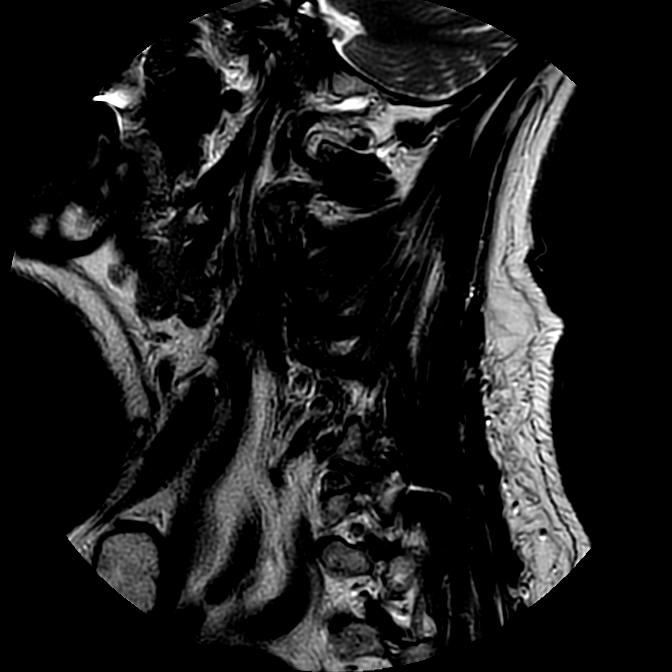

[Series 401: t1_(person_name) · sagittal · 3.0mm · 0.41mm/px · 4 of 15 slices shown]
[im 1/15]
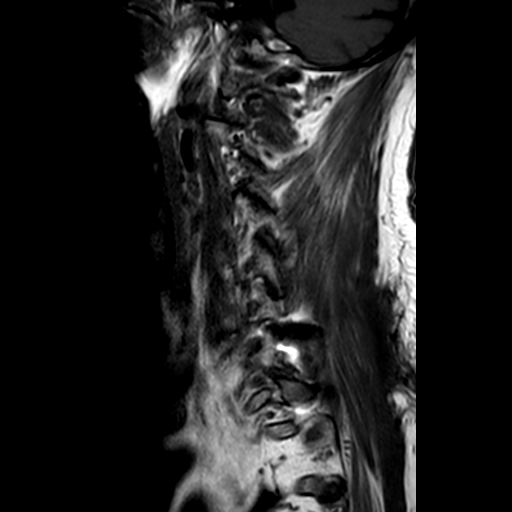
[im 5/15]
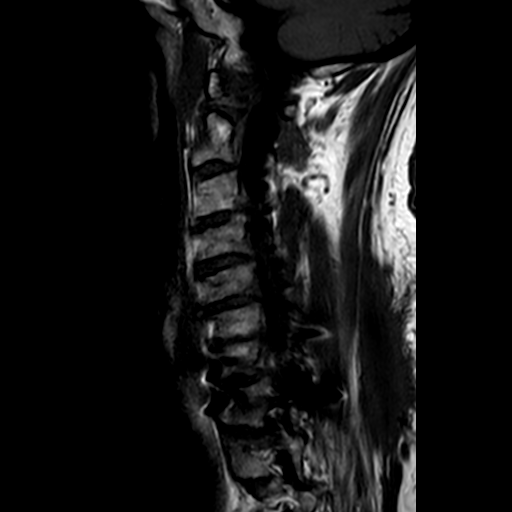
[im 10/15]
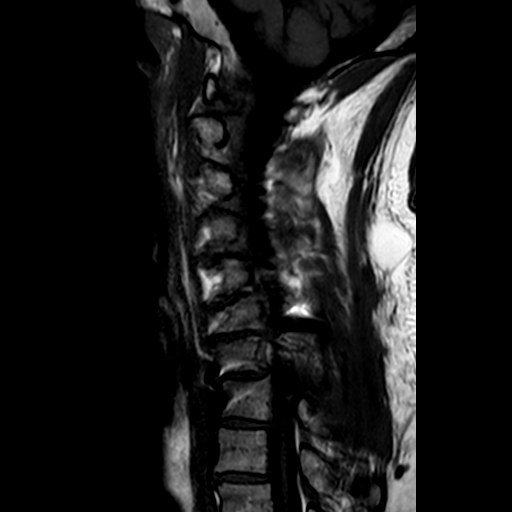
[im 15/15]
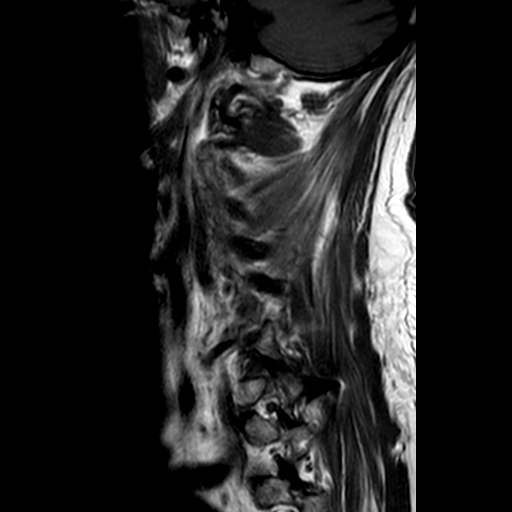

[Series 501: stir_(person_name) · sagittal · 3.0mm · 0.41mm/px · 4 of 15 slices shown]
[im 1/15]
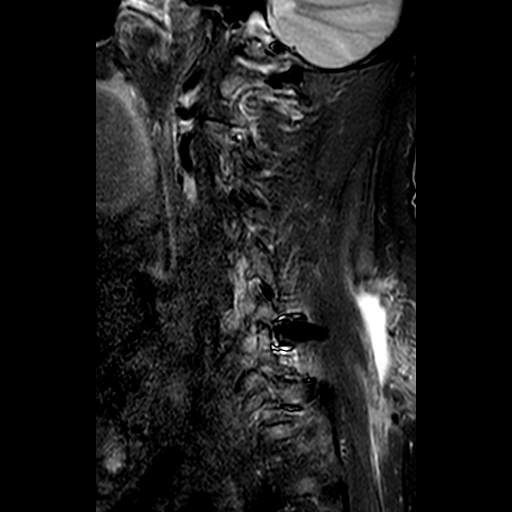
[im 5/15]
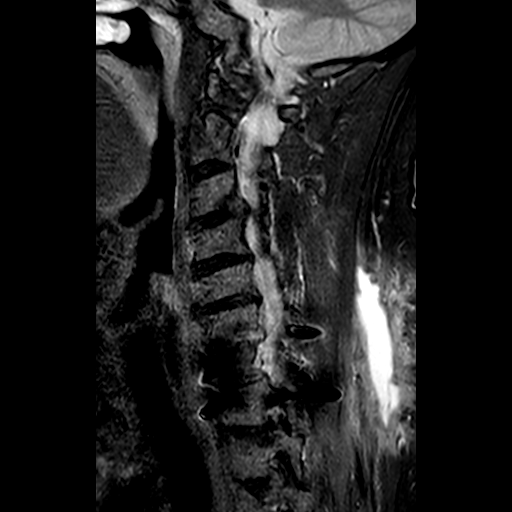
[im 10/15]
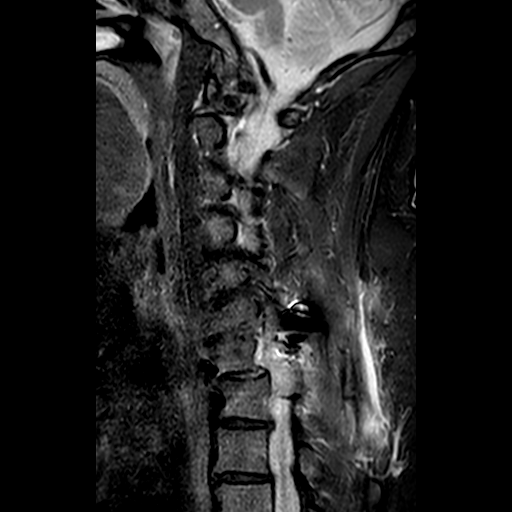
[im 15/15]
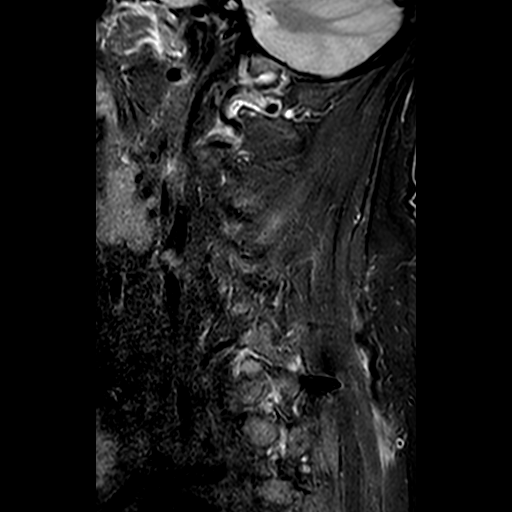

[Series 601: t2_(person_name)_(person_name) · axial · 3.0mm · 0.29mm/px · 1 of 48 slices shown]
[im 1/48]
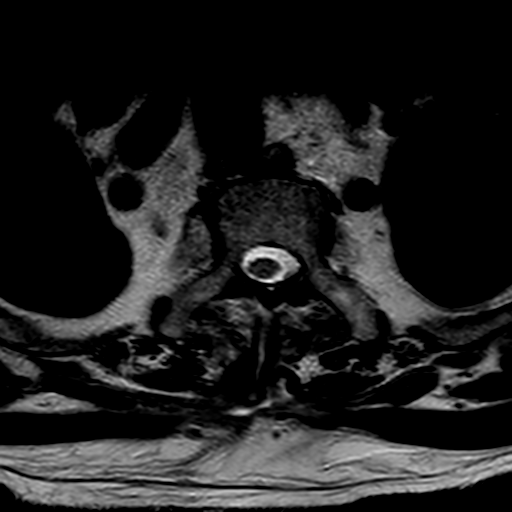

[18 of 48 positions shown; findings below may reference images not displayed]

FINDINGS: Cervical vertebral heights are intact. There is slight anterolisthesis 
at C4-5. There is marked disc narrowing at C5-6 and C6-7. 
Patient is status post what appears to be anterior cervical discectomy with 
plate and screw fusion at C7-T1. There is posterior element fusion hardware 
extending from C6-7 through T1.. Less than grade 1 anterolisthesis at this 
level. There has been posterior decompression. There is increased T2 signal 
within the cord opposite the upper T1 level which extends vertically 9 mm, 
somewhat greater the right of midline as seen on axial T2 image 7. This shows 
enhancement, extending vertically 8 mm. The April 07, 2021 thoracic MRI showed 
severe canal stenosis at this level. On the prior MRI, there was increased T2 
signal in the cord below this extending to the T1-2 level. The thoracic MRI did 
not include contrast administration. 
No other cervical cord signal abnormality. There is posterior disc bulging 
opposite C3-4, C4-5 and C5-6, contributing to mild to moderate canal stenosis. 
Canal diameter at C4-5 is 6.5 mm, at C5-6 6.3 mm. At C3-4 diameter is 8 mm. The 
canal is open at C2-3 and C6-7. 
Axial images are limited by motion artifact. There is marked right, moderate to 
marked left foraminal stenosis at C3-4. Marked left, moderate right foraminal 
stenosis at C4-5. Marked left, moderate to marked right foraminal stenosis at 
C5-6. Marked bilateral foraminal stenosis at C6-7. There is moderate left 
foraminal stenosis at C7-T1, right foramen open. 
Enhanced images show postsurgical enhancement in the posterior paraspinal soft 
tissues from C6-7 through T1-2. There is a vertically oriented seroma extending 
4.6 cm,, not involving the neural canal. There is no concerning osseous 
enhancement. 
The dens is intact. The craniocervical junction is open.
IMPRESSION: Patient is status post fusion and decompression opposite C7-T1, including 
posterior element fusion. Previously evident severe stenosis has been 
decompressed. There is increased T2 signal and enhancement in the cord opposite 
the upper T1 segment which may represent enhancement within the area of cord 
contusion. Less likely would be neoplastic or other inflammatory involvement. 
Short-term follow-up enhanced cervical MRI in 2-3 months would be useful. 
Postoperative seroma. This does not deform the neural canal. 
Cervical spondylosis elsewhere. Moderate canal stenosis at C4-5 and C5-6. There 
is significant-appearing multilevel foraminal stenosis. 
No evidence for fracture. No findings to indicate osseous malignancy.

## 2021-06-14 IMAGING — DX CERVICAL SPINE 2 VIEWS
2 series · 2 of 2 positions shown · non-contrast
Comparison: No prior cervical examination

________________________________________________________________________________________________ 
CERVICAL SPINE 2 VIEWS, 06/14/2021 [DATE]: 
CLINICAL INDICATION: Follow-up surgery

[lateral]
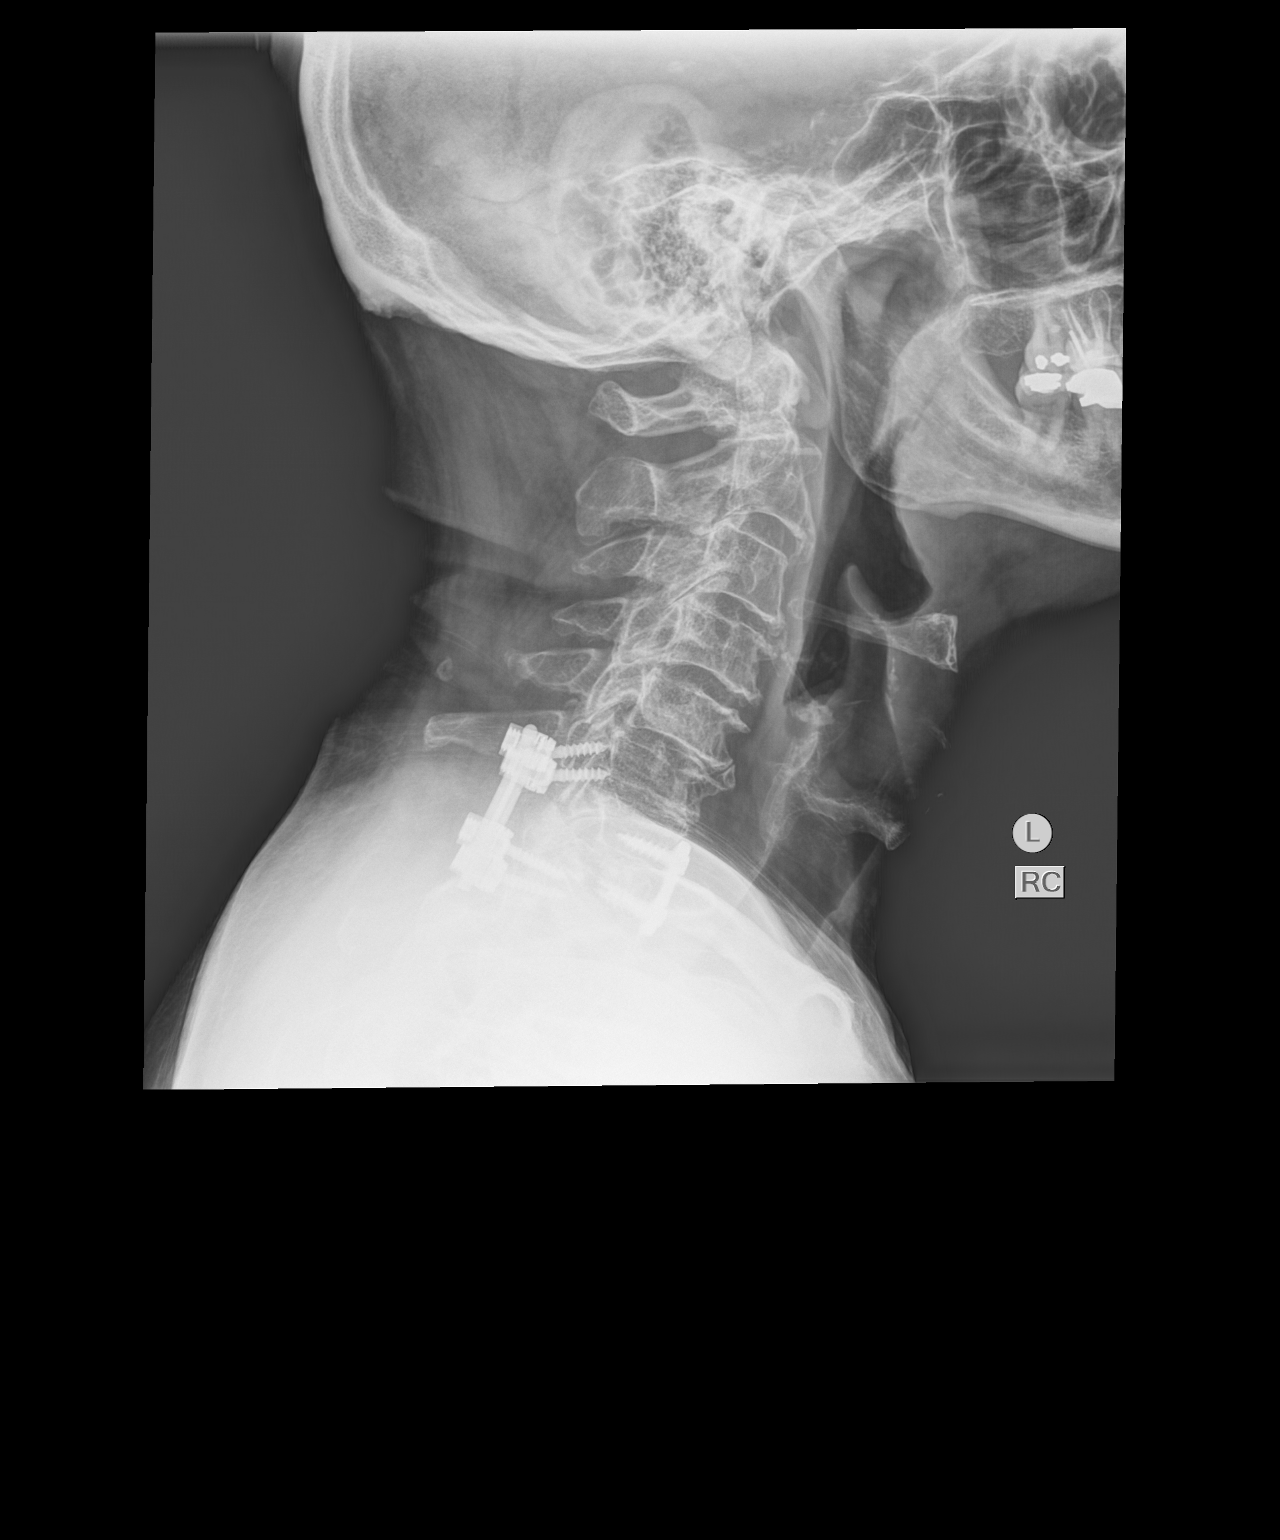

[AP]
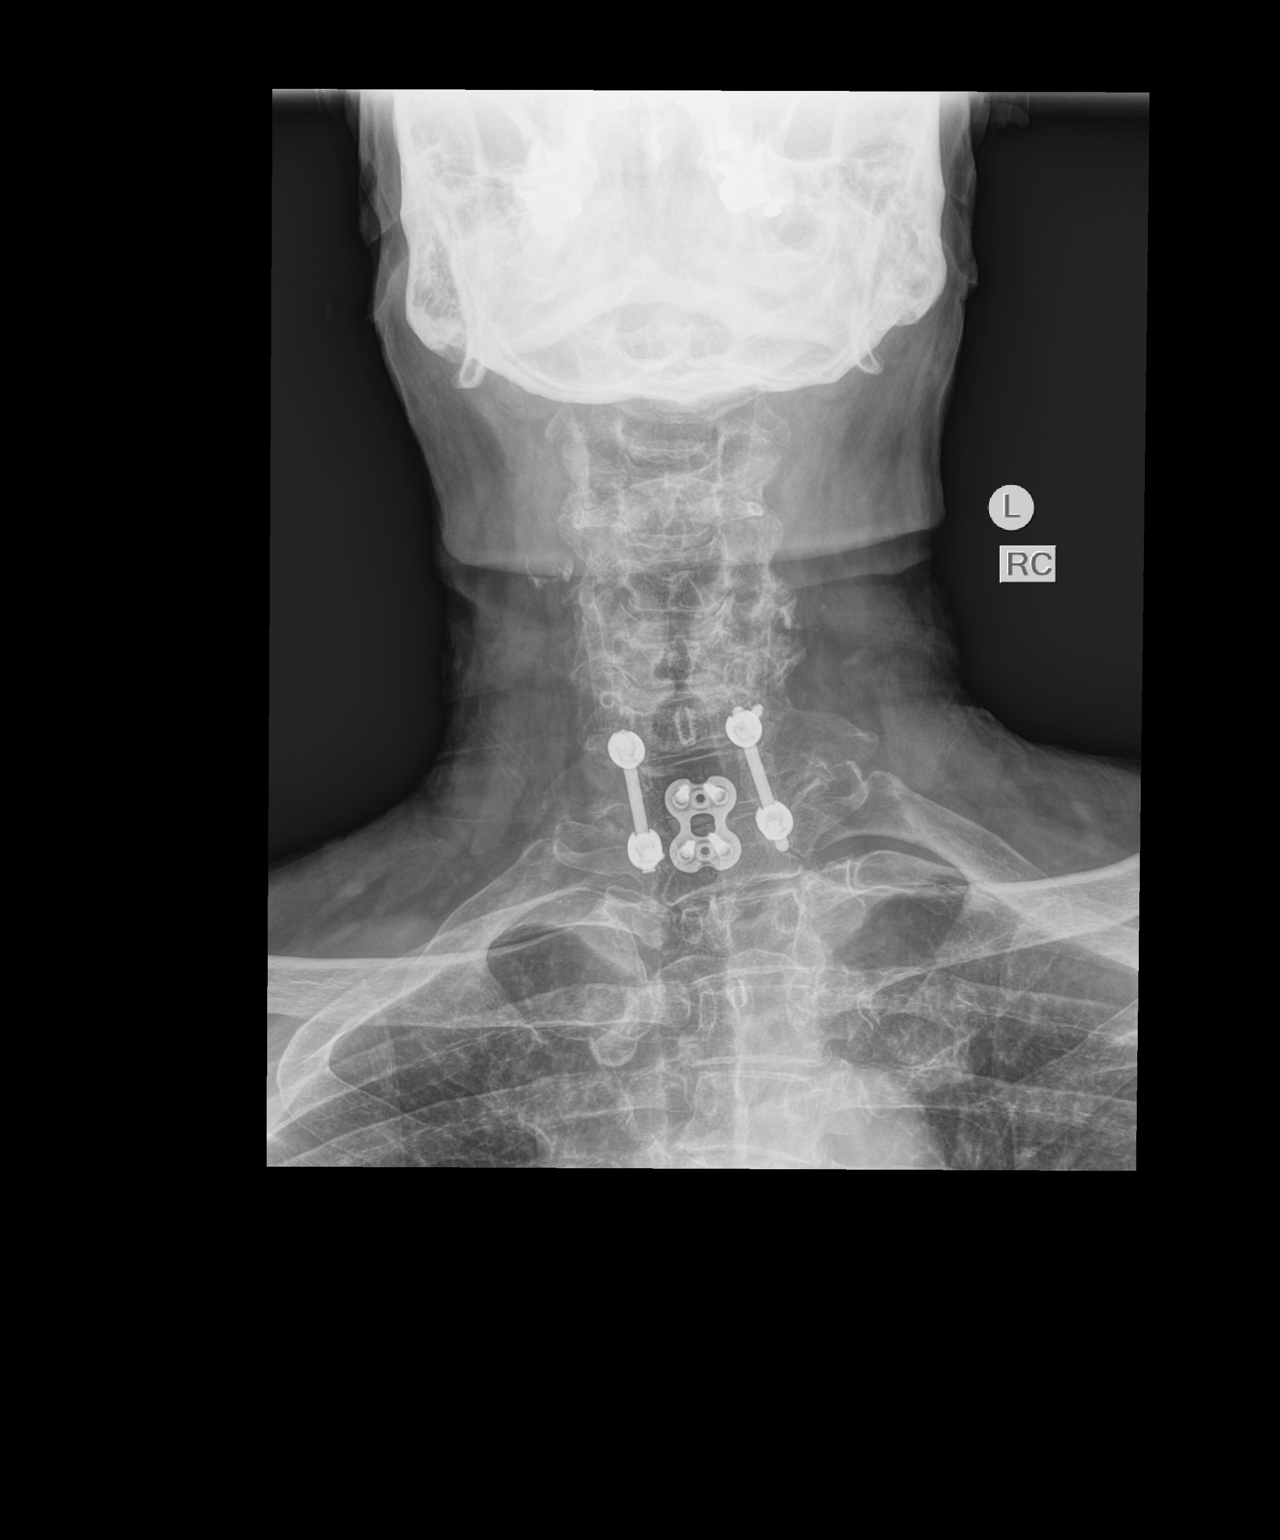

[2 of 2 positions shown; findings below may reference images not displayed]

FINDINGS: Patient is status post what appears to be anterior cervical discectomy 
with plate and screw hardware at C7-T1. There has been posterior element fusion 
from C6 through T1. There appears to been posterior decompression opposite C7. 
There is mild cervical dextrocurvature. There is osteopenia. There is 
spondylosis. The dens is intact..
IMPRESSION: Appropriate position of cervicothoracic fusion hardware.

## 2021-10-24 IMAGING — MR MRI THORACIC SPINE WITHOUT CONTRAST
4 of 6 series · 11 of 48 positions shown · non-contrast
Comparison: Thoracic MRI April 07, 2021

________________________________________________________________________________________________ 
MRI THORACIC SPINE WITHOUT CONTRAST, 10/24/2021 [DATE]: 
CLINICAL INDICATION: Spondylosis with myelopathy, thoracic region
TECHNIQUE: Sagittal T1, Sagittal T2, Sagittal STIR, Axial T2 and Axial T1 MR 
images of the thoracic were performed without intravenous contrast enhancement.

[Series 102: T2 · sagittal · 4.0mm · 0.62mm/px · 2 of 11 slices shown]
[im 1/11]
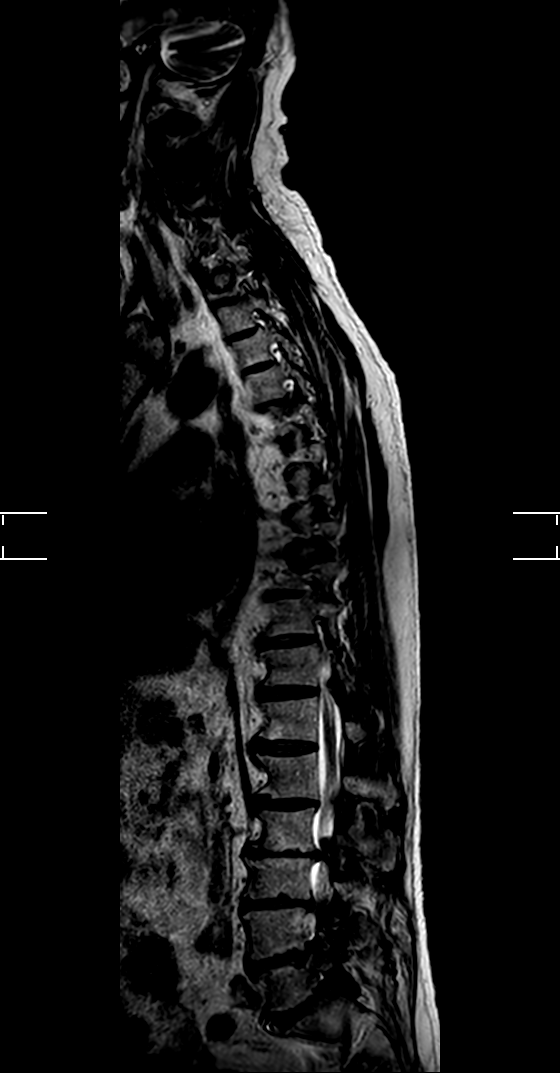
[im 11/11]
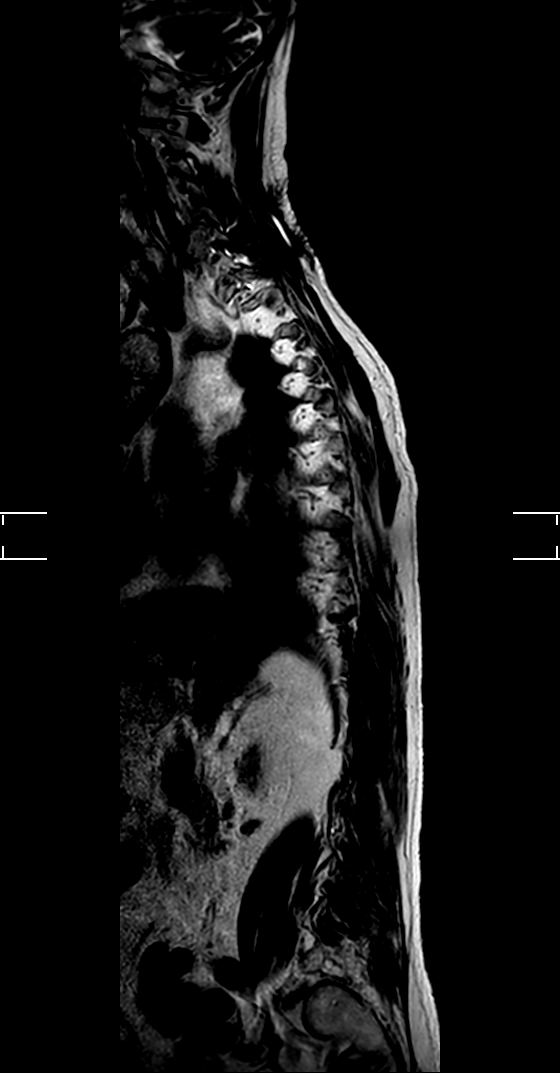

[Series 301: t2_cor_count · coronal · 4.0mm · 0.61mm/px · 3 of 11 slices shown]
[im 1/11]
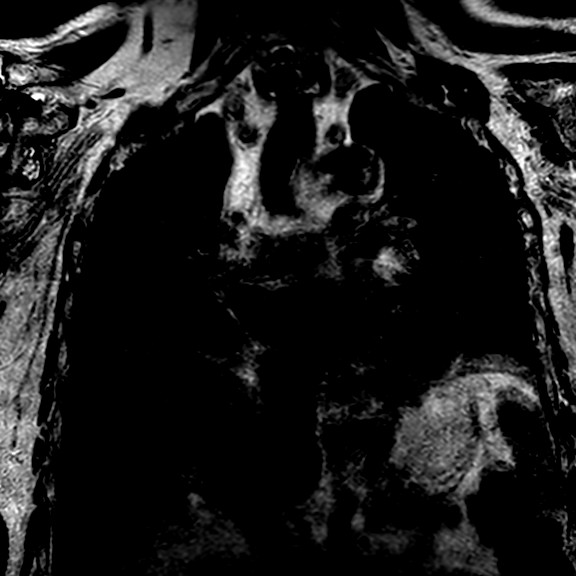
[im 6/11]
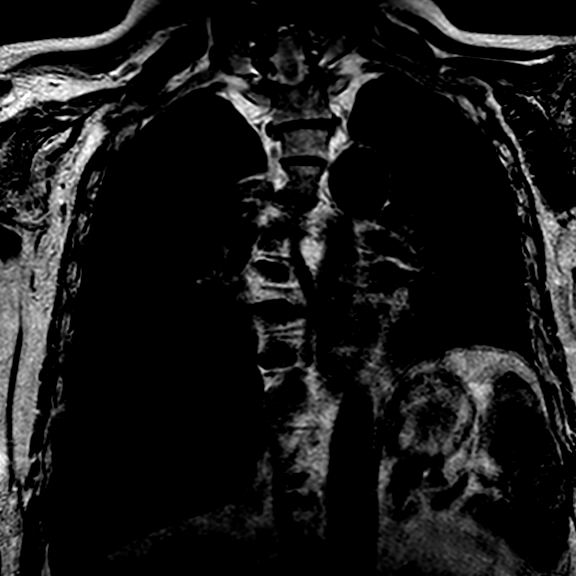
[im 11/11]
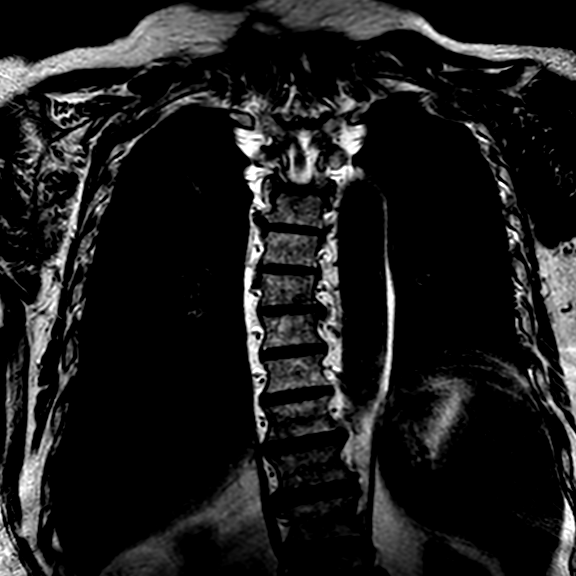

[Series 401: t1_tse_sag · sagittal · 3.0mm · 0.49mm/px · 3 of 23 slices shown]
[im 4/23]
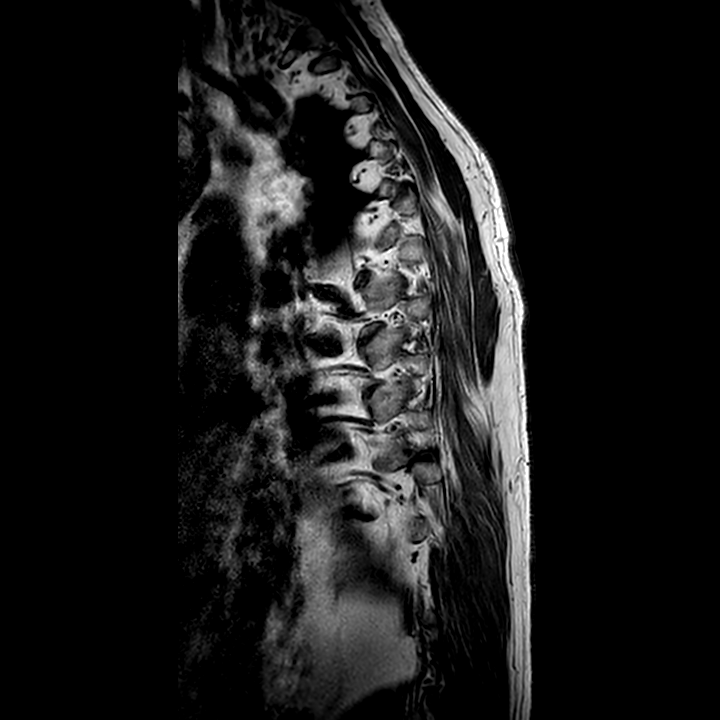
[im 12/23]
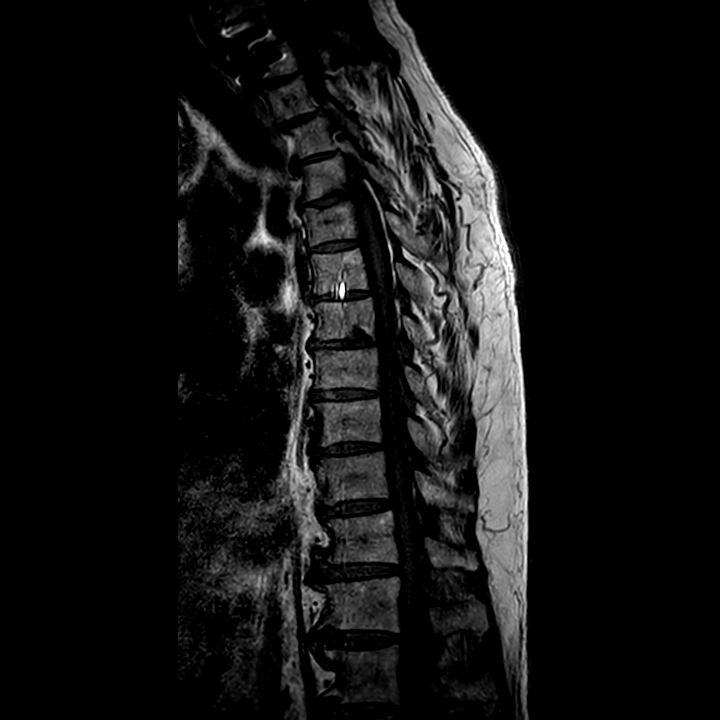
[im 19/23]
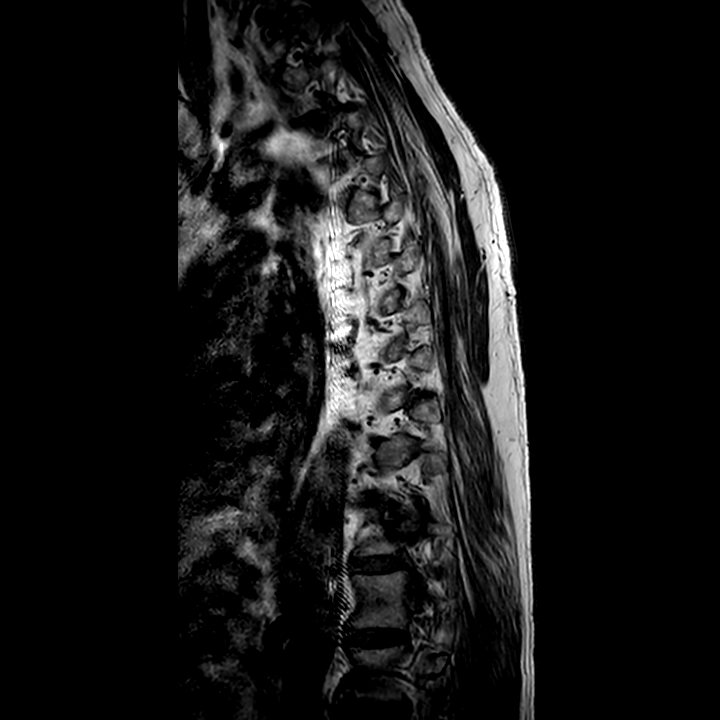

[Series 501: t2_tse_sag · sagittal · 3.0mm · 0.53mm/px · 3 of 23 slices shown]
[im 4/23]
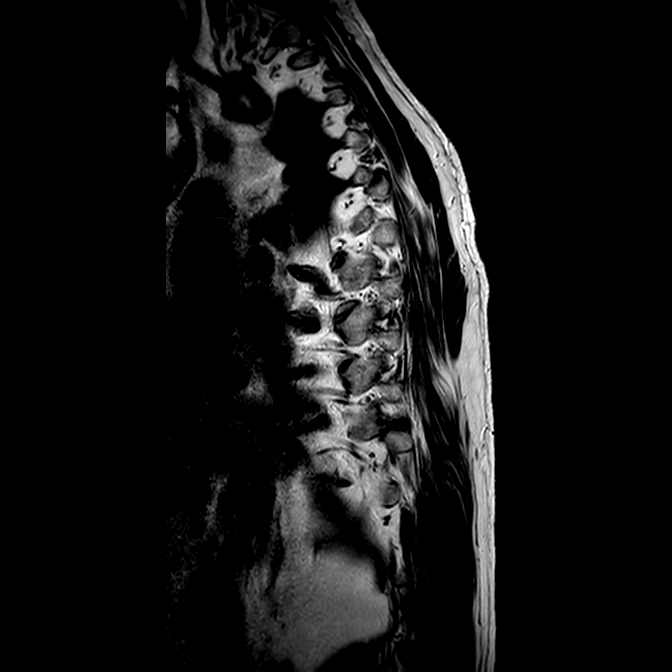
[im 12/23]
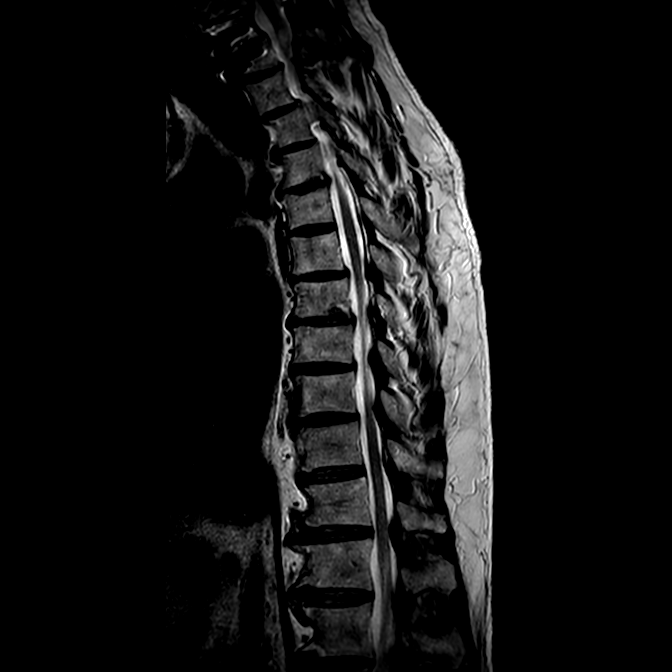
[im 19/23]
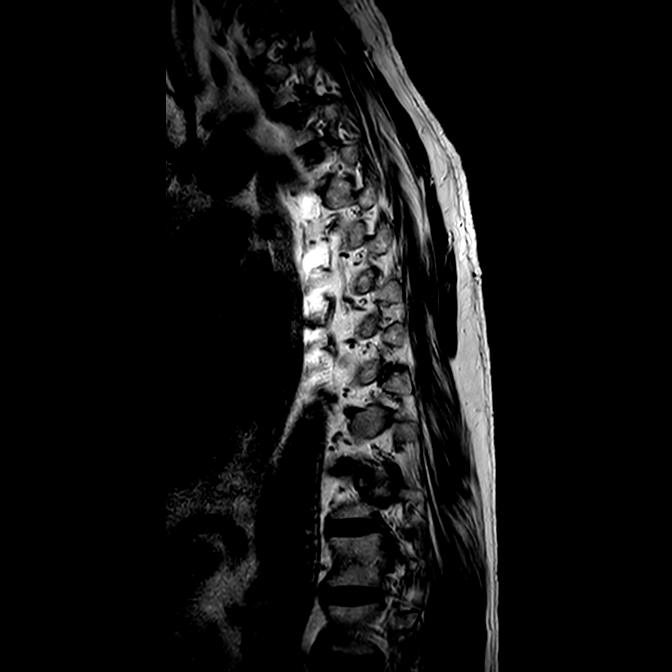

[11 of 48 positions shown; findings below may reference images not displayed]

FINDINGS: Thoracic vertebral heights are intact. There is moderate degenerative 
change in the mid to upper thoracic spine. With the patient supine there is no 
extrinsic cord deformity. There is mild canal stenosis at several levels, 
appearing most pronounced at T11-12, T1-T2 and T2-3, similar to the prior study. 
There is slight anterolisthesis at T2-3 and T4-5. 
There is increased T2 signal within the cord opposite C7-T1 consistent with 
myelomalacic change, extending 11 mm vertically. There has been anterior 
discectomy with plate and screw fusion with posterior decompression at this 
level since the prior MRI. Previously evident canal stenosis at C7-T1 has been 
relieved. There is slight dorsal prolapse of the cord as seen on sagittal views 
at this level. 
No other intramedullary signal abnormality. No evidence for fracture. Mild 
endplate degenerative reactive changes/Modic type I at T4-5. 
There is mild to moderate mid thoracic dextrocurvature, upper thoracic 
levoscoliosis. 
There is no paraspinal mass. No layering pleural fluid identified. No 
mediastinal adenopathy on limited evaluation.
IMPRESSION: Multilevel degenerative changes associated with thoracic scoliosis as described, 
not appearing significantly different from the prior study. There is mild Modic 
type I change at T4-5 appears to have been present on the prior study, although 
the prior STIR examination was markedly limited by motion artifact. 
Increased T2 signal within the cord opposite C7-T1. There has been anterior 
discectomy with plate and screw fusion at this level since the prior MRI. 
Previously evident canal stenosis at C7-T1 has been reduced.

## 2021-10-24 IMAGING — MR MRI LUMBAR SPINE WITHOUT CONTRAST
4 of 8 series · 10 of 48 positions shown · IV contrast (gadolinium)
Comparison: Lumbar MRI April 07, 2021

________________________________________________________________________________________________ 
MRI LUMBAR SPINE WITHOUT CONTRAST, 10/24/2021 [DATE]: 
CLINICAL INDICATION: Spondylosis with myelopathy, fell March 12, 2021, 
numbness extending to right and left leg is worsening
TECHNIQUE: Sagittal T1, Sagittal T2, Sagittal STIR, Axial T1 and Axial T2 MR 
images of the lumbar spine were performed without intravenous gadolinium 
enhancement.

[Series 101: t2_sag_count · sagittal · 4.0mm · 0.62mm/px · 3 of 22 slices shown]
[im 1/22]
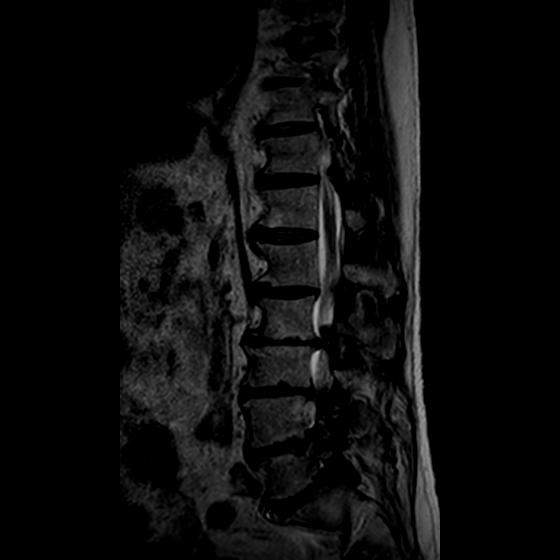
[im 11/22]
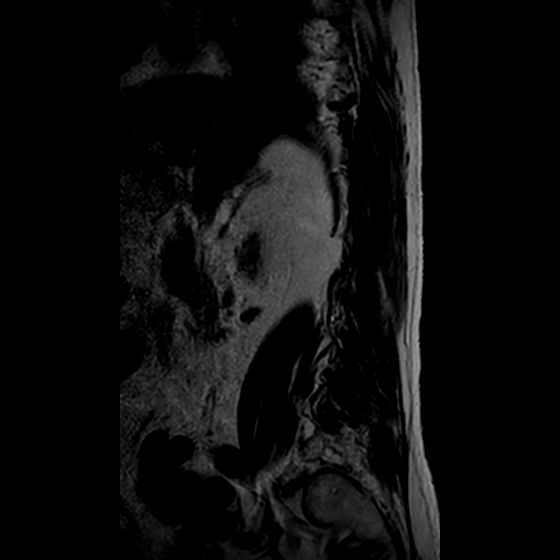
[im 22/22]
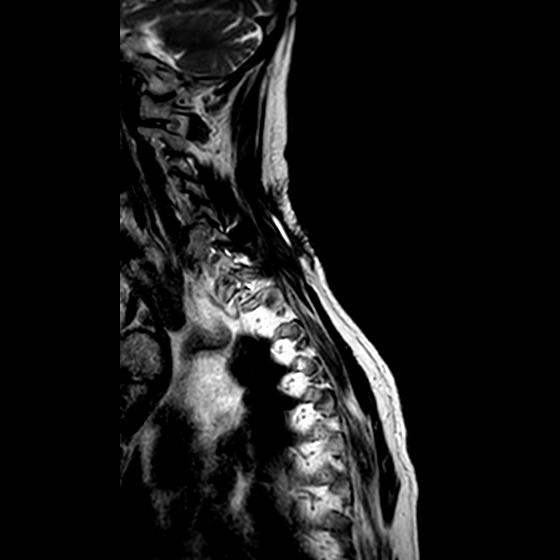

[Series 201: t2w_cor-surv · coronal · 6.0mm · 0.62mm/px · 1 of 10 slices shown]
[im 1/10]
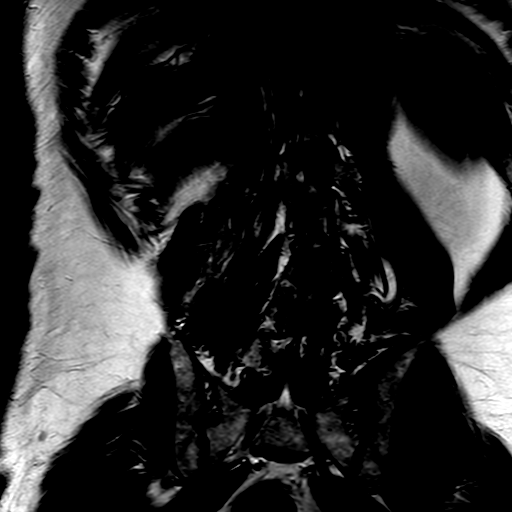

[Series 801: t1_tse_sag · sagittal · 4.0mm · 0.51mm/px · 3 of 19 slices shown]
[im 1/19]
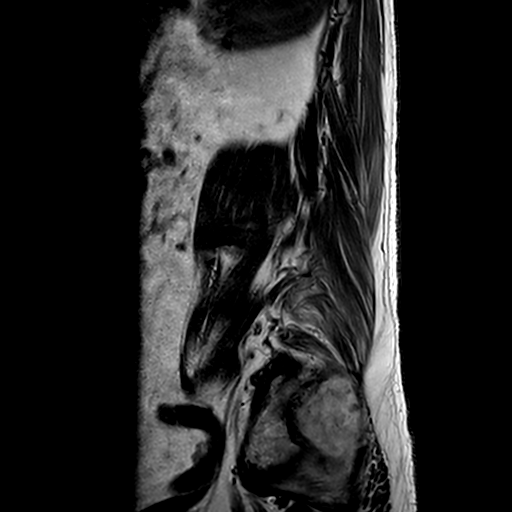
[im 10/19]
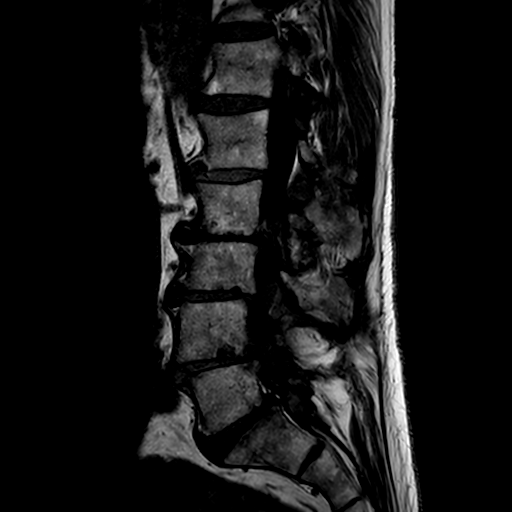
[im 19/19]
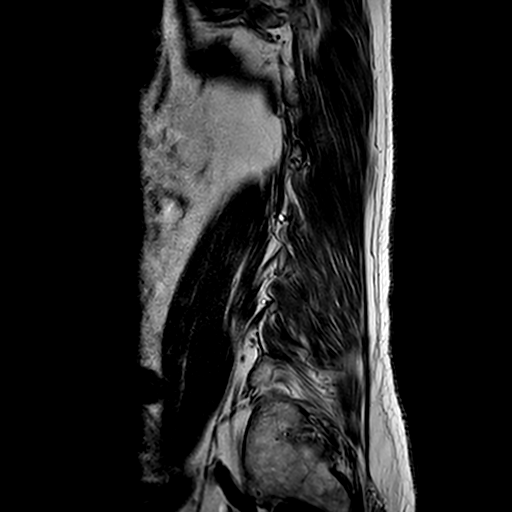

[Series 902: (id) view_ax mpr · axial · 1.0mm · 0.25mm/px · z∈[-432,-287]mm · 3 of 100 slices shown]
[im 16/100]
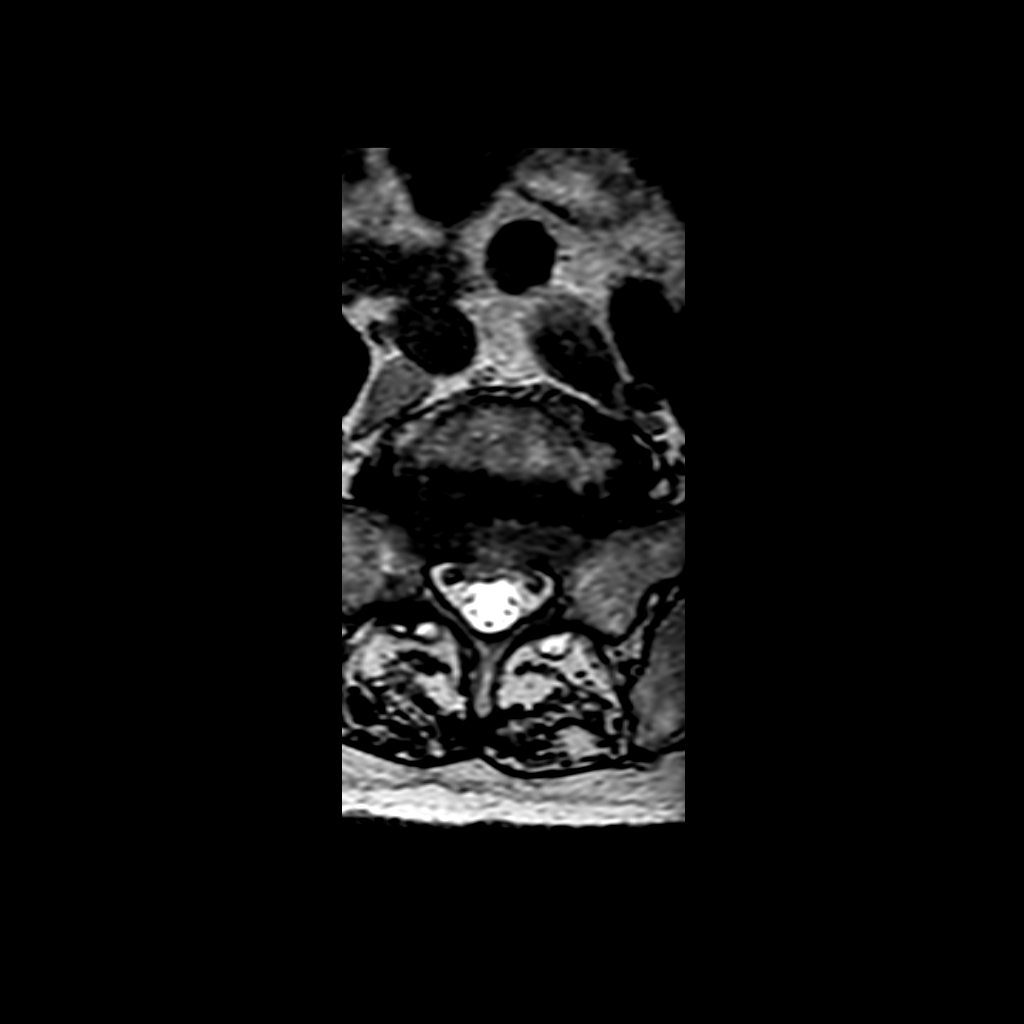
[im 54/100]
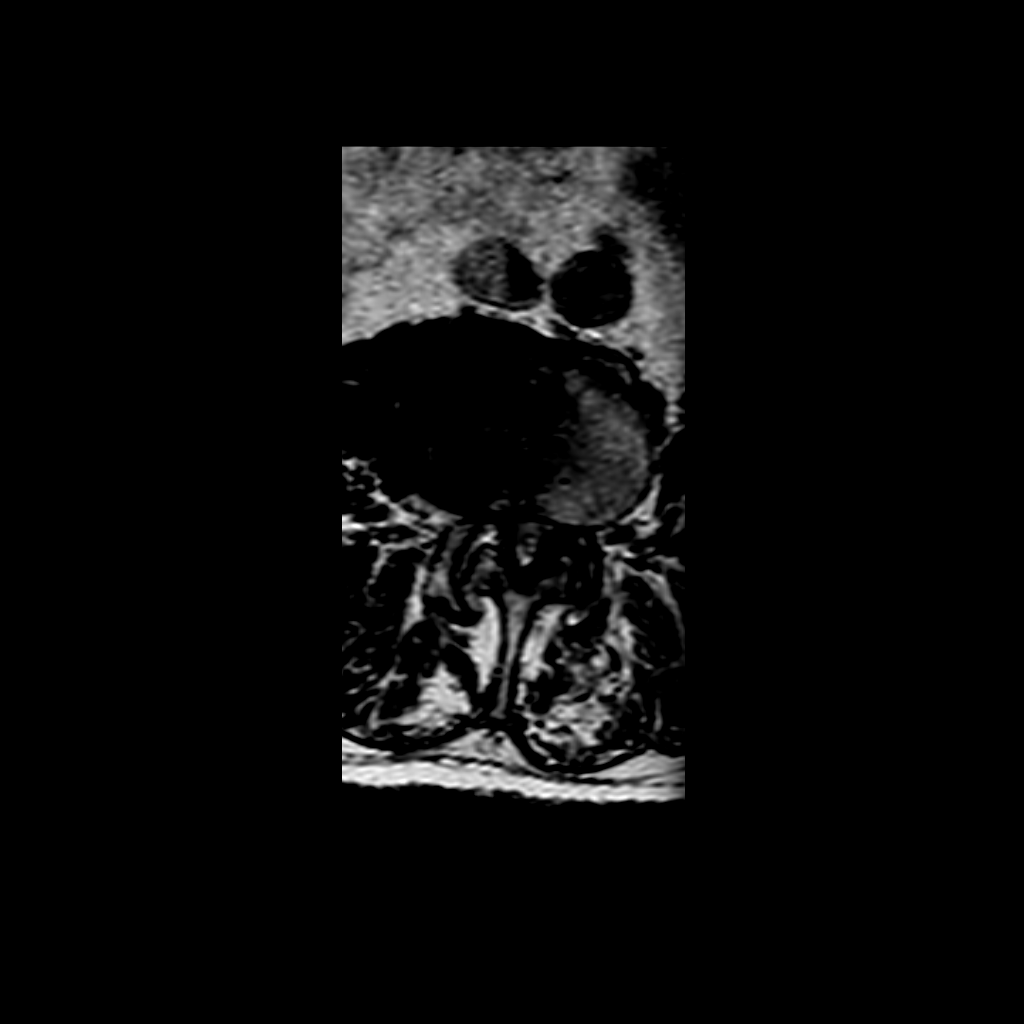
[im 84/100]
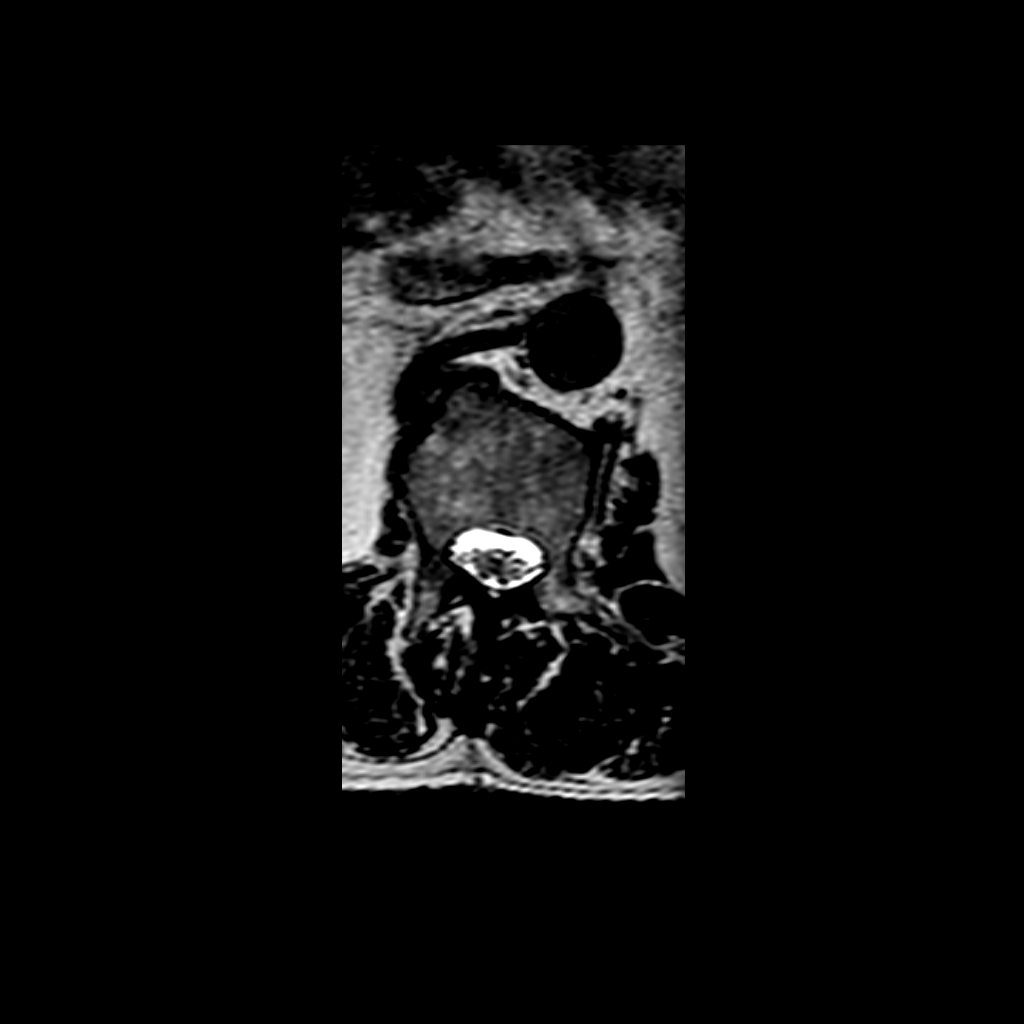

[10 of 48 positions shown; findings below may reference images not displayed]

FINDINGS: There is no evidence for compression fracture. The conus terminates at 
T12-L1. There is mild to moderate upper lumbar levoscoliosis, not significantly 
different. No findings to indicate spinal malignancy. 
At L5-S1 there is marked disc narrowing. Less than grade 1 degenerative 
anterolisthesis is unchanged. There is broad-based disc bulge/extrusion 
impinging the upper S1 lateral recesses, worse on the right, not different. 
There are moderate facet degenerative changes contributing to mild canal 
stenosis. There is moderate bilateral foraminal stenosis. 
At L4-5 there is marked canal stenosis. There is marked facet hypertrophy with 
ligamentous thickening, and broad-based disc bulge. Less than grade 1 
anterolisthesis. There is impingement of the L5 nerve roots bilaterally, best 
seen on axial T2 image 8. There is moderate left foraminal stenosis. Marked disc 
narrowing. These findings are not different. 
At L3-4 there is moderate to marked canal stenosis due to acquired degenerative 
change, not different. There is less than grade 1 retrolisthesis. There is 
moderate right, mild left foraminal stenosis. Marked disc narrowing. 
At L2-3 there is moderate canal stenosis, worse on the right, impinging the 
right L4 III nerve root. Foramina are open. 
At L1-2 the canal is open. Mild right foraminal narrowing.
IMPRESSION: Multilevel degenerative changes. There is marked canal stenosis at L4-5, 
moderate to marked at L3-4, moderate at L2-3. Impingement of the S1 lateral 
recesses at L5-S1, worse on the right. Multilevel foraminal stenosis as 
described. These findings are not significantly changed from the March 2021 
MRI. 
There are Modic type I changes, most pronounced at L4-5, present previously. 
No evidence for fracture or spinal malignancy. There is mild to moderate upper 
lumbar levoscoliosis .

## 2021-10-28 DIAGNOSIS — I739 Peripheral vascular disease, unspecified: Secondary | ICD-10-CM | POA: Insufficient documentation

## 2022-09-01 ENCOUNTER — Encounter (INDEPENDENT_AMBULATORY_CARE_PROVIDER_SITE_OTHER): Payer: Self-pay | Admitting: Student in an Organized Health Care Education/Training Program

## 2022-09-01 ENCOUNTER — Other Ambulatory Visit: Payer: Self-pay

## 2022-09-01 ENCOUNTER — Ambulatory Visit
Payer: Medicare Other | Attending: Student in an Organized Health Care Education/Training Program | Admitting: Student in an Organized Health Care Education/Training Program

## 2022-09-01 VITALS — BP 107/71 | HR 88 | Temp 97.5°F | Resp 16 | Ht 69.0 in | Wt 195.0 lb

## 2022-09-01 DIAGNOSIS — M238X1 Other internal derangements of right knee: Secondary | ICD-10-CM | POA: Insufficient documentation

## 2022-09-01 DIAGNOSIS — I700 Atherosclerosis of aorta: Secondary | ICD-10-CM | POA: Insufficient documentation

## 2022-09-01 DIAGNOSIS — I10 Essential (primary) hypertension: Secondary | ICD-10-CM | POA: Insufficient documentation

## 2022-09-01 DIAGNOSIS — I82402 Acute embolism and thrombosis of unspecified deep veins of left lower extremity: Secondary | ICD-10-CM | POA: Insufficient documentation

## 2022-09-01 DIAGNOSIS — D696 Thrombocytopenia, unspecified: Secondary | ICD-10-CM | POA: Insufficient documentation

## 2022-09-01 DIAGNOSIS — Z9889 Other specified postprocedural states: Secondary | ICD-10-CM | POA: Insufficient documentation

## 2022-09-01 MED ORDER — LISINOPRIL 40 MG TABLET
40.0000 mg | ORAL_TABLET | Freq: Every day | ORAL | 1 refills | Status: DC
Start: 2022-09-01 — End: 2023-02-28

## 2022-09-01 MED ORDER — METOPROLOL TARTRATE 25 MG TABLET
25.0000 mg | ORAL_TABLET | Freq: Two times a day (BID) | ORAL | 1 refills | Status: DC
Start: 2022-09-01 — End: 2023-02-28

## 2022-09-01 NOTE — Patient Instructions (Signed)
It was a pleasure taking care of you today!  Your team today was Nurse Leslie and Dr. Tashayla Therien.  Please keep your follow-up appointment if we scheduled one.   If we ordered any labs or investigations, we will call you or message you with the results in 4-5 days, or sooner if there is something urgent.  If you don't hear back from us in a week, please call the clinic or message us on MyChart.  There is a provider on call every day of the week, but please refrain from calling outside office hours with non-urgent questions.  You can also message us for refills and referrals.  Our fax is: [phone]    I highly recommend you register on MyChart, if you haven't done so already. There you can request medication refills, appointments, your lab results, and referrals.    If you require help with food, transportation, housing, paying bills, and more, please visit [email or web address] and you will see a link at the bottom of the page titled "FindHelp".

## 2022-09-01 NOTE — Assessment & Plan Note (Signed)
Chronic/stable.Tolerating meds.No side effects.Return in 3 months. Will continue with the medications.  [facility] was last reviewed on 09/01/2022  2:36 PM by Geralynn Ochs and result was REVIEWED PDMP.     Based on this, I have no concerns for prescribing controlled substances at this time.

## 2022-09-01 NOTE — Assessment & Plan Note (Signed)
Referral to orthopedics 

## 2022-09-01 NOTE — Progress Notes (Signed)
[place]-  Operated by [facility]  Progress Note    Name: Gary White MRN:  [id number]   Date: 09/01/2022 Age: 77 y.o.         Reason for Visit: New Patient (Getting established. Patient states he needs a referral to orthopedics for his right knee. Also would like to discuss a referral to Neurology. Patient recently moved to the are from [place]. )    History of Present Illness  Gary White is a 77 y.o. male who is being seen in our clinic today for the above reason, as well as to follow up on his chronic conditions.    Problem   Atherosclerosis of Aorta (Cms Hcc)   Disorder Involving Thrombocytopenia (Cms Hcc)   Htn (Hypertension)    Lisino 40, metoprolol 25 twice daily   Adherent daily. 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)    Had been taking warfarin since 2009   Left leg proximal  Had left knee replaced and about a month later   Sounds like provoked  No cancer history   Would like to stop AC  No other reason known for [facility] (no Afib, no PE, no other clots)  Laminectomies came after TKR     Joint Laxity of Right Knee    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 N   Health Education and Literacy   How often do you have a problem understanding what is told to you about your medical condition?  Never   Domestic Violence   Because we are aware of abuse and domestic violence  today, we ask all patients: Are you being hurt, hit, or frightened by anyone at your home or in your life?  N   Basic Needs   Do you have any basic needs within your home that are not being met? (such as Food, Shelter, Games developer, Tranportation, paying for bills and/or medications) N   Advanced Directives   Do you have any advanced directives? Living Will & MPOA   Do you have the Advanced Directive(s) so we can scan them to your chart? Y          Medical History     Diagnosis Date Comment Source    DVT (deep venous thrombosis) (CMS HCC)       Essential hypertension       Neuropathy (CMS HCC)       Spinal cord compression (CMS [facility])            Past Surgical History:   Procedure Laterality Date    HX CERVICAL LAMINECTOMY  03/2021    C7-T1    HX KNEE  REPLACMENT Left 07/2007    LAMINECTOMY  03/2016    L4-5    REPLACEMENT TOTAL KNEE Right 03/2016             Medications  finasteride (PROSCAR) 5 mg Oral Tablet, Take 1 Tablet (5 mg total) by mouth Once a day  gabapentin (NEURONTIN) 300 mg Oral Capsule, Take 1 Capsule (300 mg total) by mouth Four times a day  multivitamin with iron Oral Tablet, Take 1 Tablet by mouth Once a day  triamcinolone acetonide (ARISTOCORT A) 0.1 % Ointment, Apply topically Twice daily  lisinopriL (PRINIVIL) 40 mg Oral Tablet, Take 1 Tablet (40 mg total) by mouth Once a day  metoprolol tartrate (LOPRESSOR) 25 mg Oral Tablet, Take 1 Tablet (25 mg total) by mouth Twice daily  traMADoL (ULTRAM) 50 mg Oral Tablet, Take 1 Tablet (50 mg total) by mouth Every 6 hours as needed for Pain  warfarin (COUMADIN) 5 mg Oral Tablet, Take 1 Tablet (5 mg total) by mouth Once a day    No facility-administered medications prior to visit.    I have reviewed and reconciled the medication list with the patient today.        No Known Allergies    Family Medical History:       Problem Relation (Age of Onset)    Cancer Mother    Heart Disease Father    Macular Degen Mother              Social History     Tobacco Use    Smoking  status: Former     Average packs/day: 1 pack/day for 20.0 years (20.0 ttl pk-yrs)     Types: Cigarettes     Start date: 1966    Smokeless tobacco: Never   Vaping Use    Vaping status: Never Used   Substance Use Topics    Alcohol use: Not Currently    Drug use: Not Currently     Types: Marijuana     Comment: medical marijuana card          Health Maintenance   Topic Date Due    Hepatitis C screening  Never done    Depression Screening  Never done    Colonoscopy  Never done    Shingles Vaccine (1 of 2) Never done    Medicare Annual Wellness Visit  Never done    Covid-19 Vaccine (4 - 2023-24 season) 02/25/2022    Adult Tdap-Td (2 - Td or Tdap) 05/14/2023    Influenza Vaccine  Completed    Pneumococcal Vaccination, Age 59+  Completed    Meningococcal Vaccine  Aged Out          Review of Systems  Rest of ROS negative except as described in the HPI above.    BP 107/71 (Site: Left, Patient Position: Sitting, Cuff Size: Adult Large)   Pulse 88   Temp 36.4 C (97.5 F) (Temporal)   Resp 16   Ht 1.753 m (5\' 9" )   Wt 88.5 kg (195 lb)   SpO2 98%   BMI 28.80 kg/m       Physical Exam  Constitutional:       General: He is not in acute distress.  HENT:      Head: Normocephalic.      Mouth/Throat:      Pharynx: No oropharyngeal exudate.   Eyes:      Conjunctiva/sclera: Conjunctivae normal.   Cardiovascular:      Rate and Rhythm: Normal rate and  regular rhythm.      Pulses: Normal pulses.      Heart sounds: Normal heart sounds.   Pulmonary:      Effort: Pulmonary effort is normal.      Breath sounds: Normal breath sounds.   Skin:     General: Skin is warm.   Neurological:      General: No focal deficit present.      Mental Status: He is alert and oriented to person, place, and time.   Psychiatric:         Mood and Affect: Mood normal.         Behavior: Behavior normal.         Thought Content: Thought content normal.                         Assessment/Plan:    Problem List Items Addressed This Visit          Cardiovascular  System    Atherosclerosis of aorta (CMS HCC) - Primary    HTN (hypertension)     Chronic/stable.Tolerating meds.No side effects.Return in 6 months. Will continue with the medications.          Deep vein thrombosis (DVT) of left lower extremity (CMS HCC)     First time DVT  Will stop the warfarin  Did the 6 months (on it since 2009) with no clear indication to continue indefinitely            Hematologic/Lymphatic    Disorder involving thrombocytopenia (CMS [facility])       Musculoskeletal    Joint laxity of right knee     Referral to orthopedics           Relevant Orders    Refer to [facility]) Orthopedics, POA       Other    H/O laminectomy     Chronic/stable.Tolerating meds.No side effects.Return in 3 months. Will continue with the medications.  [facility] was last reviewed on 09/01/2022  2:36 PM by Geralynn Ochs and result was REVIEWED PDMP.     Based on this, I have no concerns for prescribing controlled substances at this time.               Relevant Orders    Refer to [facility]) Neurology, [facility]        Orders Placed This Encounter    Refer to [facility]) Orthopedics, [place]    Refer to [facility]) Neurology, [facility]    lisinopriL (PRINIVIL) 40 mg Oral Tablet    metoprolol tartrate (LOPRESSOR) 25 mg Oral Tablet      Medications Discontinued During This Encounter   Medication Reason    traMADoL (ULTRAM) 50 mg Oral Tablet     warfarin (COUMADIN) 5 mg Oral Tablet     lisinopriL (PRINIVIL) 40 mg Oral Tablet Reorder    metoprolol tartrate (LOPRESSOR) 25 mg Oral Tablet Reorder         Follow up: Return in about 6 months (around 03/04/2023) for Needs to fill medical record release at checkout.    Seek medical attention for new or worsening symptoms.      Geralynn Ochs, MD       This note was partially created using MModal Fluency Direct system (voice recognition software) and is inherently subject to errors including those of syntax and "sound-alike" substitutions which  may escape proofreading.  In such instances, original meaning may be extrapolated by contextual  derivation.

## 2022-09-01 NOTE — Nursing Note (Signed)
09/01/22 1420   Recent Weight Change   Have you had a recent unexplained weight loss or gain? N   Health Education and Literacy   How often do you have a problem understanding what is told to you about your medical condition?  Never   Domestic Violence   Because we are aware of abuse and domestic violence today, we ask all patients: Are you being hurt, hit, or frightened by anyone at your home or in your life?  N   Basic Needs   Do you have any basic needs within your home that are not being met? (such as Food, Shelter, Games developer, Tranportation, paying for bills and/or medications) N   Advanced Directives   Do you have any advanced directives? Living Will & MPOA   Do you have the Advanced Directive(s) so we can scan them to your chart? Gary White

## 2022-09-01 NOTE — Assessment & Plan Note (Addendum)
First time DVT  Will stop the warfarin  Did the 6 months (on it since 2009) with no clear indication to continue indefinitely

## 2022-09-01 NOTE — Assessment & Plan Note (Signed)
Chronic/stable.Tolerating meds.No side effects.Return in 6 months. Will continue with the medications.

## 2022-09-03 ENCOUNTER — Other Ambulatory Visit: Payer: Self-pay

## 2022-09-07 ENCOUNTER — Ambulatory Visit (INDEPENDENT_AMBULATORY_CARE_PROVIDER_SITE_OTHER): Payer: Medicare Other | Admitting: GENERAL

## 2022-09-07 ENCOUNTER — Encounter (INDEPENDENT_AMBULATORY_CARE_PROVIDER_SITE_OTHER): Payer: Self-pay | Admitting: GENERAL

## 2022-09-07 ENCOUNTER — Inpatient Hospital Stay
Admission: RE | Admit: 2022-09-07 | Discharge: 2022-09-07 | Disposition: A | Discharge status: Home / Self Care | Payer: Medicare Other | Source: Ambulatory Visit | Attending: GENERAL | Admitting: GENERAL

## 2022-09-07 ENCOUNTER — Other Ambulatory Visit: Payer: Self-pay

## 2022-09-07 VITALS — BP 130/84 | HR 84 | Temp 99.3°F | Ht 69.0 in | Wt 195.6 lb

## 2022-09-07 DIAGNOSIS — T85848A Pain due to other internal prosthetic devices, implants and grafts, initial encounter: Secondary | ICD-10-CM

## 2022-09-07 DIAGNOSIS — S8980XA Other specified injuries of unspecified lower leg, initial encounter: Secondary | ICD-10-CM

## 2022-09-07 DIAGNOSIS — M238X1 Other internal derangements of right knee: Secondary | ICD-10-CM

## 2022-09-07 NOTE — H&P (Addendum)
SPORTS MEDICINE, [facility]  [address]  [place] [address]    Progress Note    Name: Gary White MRN:  [id number]   Date: 09/07/2022 DOB:  1945/11/05 (76 y.o.)                   Name Gary White   MRN  [id number]   Date of service 09/07/2022    Chief complaint Knee Pain (Right knee hyperextending)         HPI     Gary White presents today with   Chief Complaint   Patient presents with    Knee Pain     Right knee hyperextending     Patient here for right knee problems since 2022. He has TKA of right and left knee. Right knee performed in 2017 in [place]. He reports in 2022 this knee started hyperextending while he was walking. He has also had some neurologic issues that are effecting his gait. He has performed PT for these issues without relief. He has a brace which is ineffective for him at this time. He also has hx of spinal decompression in cervical spine without continued neurologic symptoms including foot drop. He reports no falls trauma or known injury that would have caused this.       Review of Systems: ROS negative other than pertinent positives in HPI.    Current Outpatient Medications   Medication Sig    finasteride (PROSCAR) 5 mg Oral Tablet Take 1 Tablet (5 mg total) by mouth Once a day    gabapentin (NEURONTIN) 300 mg Oral Capsule Take 1 Capsule (300 mg total) by mouth Four times a day    lisinopriL (PRINIVIL) 40 mg Oral Tablet Take 1 Tablet (40 mg total) by mouth Once a day for 180 days    metoprolol tartrate (LOPRESSOR) 25 mg Oral Tablet Take 1 Tablet (25 mg total) by mouth Twice daily for 180 days    multivitamin with iron Oral Tablet Take 1 Tablet by mouth Once a day    triamcinolone acetonide (ARISTOCORT A) 0.1 % Ointment Apply topically Twice daily       PHQ Questionnaire       PMHx, PFHx, PSH, Allergy, Social Hx is Reviewed.   PHYSICAL EXAMINATION     BP 130/84 (Site: Right, Patient Position: Sitting)   Pulse 84   Temp 37.4 C (99.3 F) (Tympanic)    Ht 1.753 m (5\' 9" )   Wt 88.7 kg (195 lb 9.6 oz)   SpO2 98%   BMI 28.89 kg/m          Right Knee Exam     Tenderness   The patient is experiencing tenderness in the medial joint line and patella.    Range of Motion   Extension:  abnormal   Flexion:  normal     Tests   McMurray:  Medial - negative Lateral - negative  Varus: negative Valgus: negative  Lachman:  Anterior - negative    Posterior - negative  Drawer:  Anterior - negative    Posterior - negative  Patellar apprehension: negative    Other   Erythema: absent  Sensation: normal  Pulse: present  Swelling: none    Comments:  Knee has some valgus deformity and patella appears to sit medially in flexion                Recent Lab and Imaging: bilateral weight bearing XR 2 views of each knee. Has  bilateral replacements right appears to have hardware movement although no images to compare too.   ASSESSMENT/ PLAN     ENCOUNTER DIAGNOSES     ICD-10-CM   1. Knee hyperextension injury  S89.80XA   2. Pain from implanted hardware, initial encounter  T85.848A     Given TKA and abnormality on XR will discuss this with orthopedics and see if hardware is possibly the issue and if he needs to be seen for possible revision. He is agreeable to this. Will call him follow up appointment. We did place him in an immobilizer today to stop the hyperextension and help with his stability.      Orders Placed This Encounter    XR KNEE STANDING BILATERAL 2 VIEW     Patient Education       The patient was given the opportunity to ask questions and those questions were answered to the patient's satisfaction. The patient was encouraged to call with any additional questions or concerns. Instructed patient to call back if symptoms worse. Discussed with patient effects and side effects of medications. Medication safety was discussed. A copy of the patient's medication list was printed and given to the patient. A good faith effort was made to reconcile the patient's medications. This note was  partially generated using MModal Fluency Direct system, and there may be some incorrect words, spellings, and punctuation that were not noted in checking the note before saving.    Follow up: Return pending orthopedic surgery recommendations.     Carlis Abbott, DO  09/08/2022  14:58

## 2022-09-09 ENCOUNTER — Other Ambulatory Visit (INDEPENDENT_AMBULATORY_CARE_PROVIDER_SITE_OTHER): Payer: Self-pay | Admitting: GENERAL

## 2022-09-09 ENCOUNTER — Encounter (INDEPENDENT_AMBULATORY_CARE_PROVIDER_SITE_OTHER): Payer: Self-pay | Admitting: GENERAL

## 2022-09-09 DIAGNOSIS — M21861 Other specified acquired deformities of right lower leg: Secondary | ICD-10-CM

## 2022-09-09 DIAGNOSIS — Z96651 Presence of right artificial knee joint: Secondary | ICD-10-CM

## 2022-09-09 NOTE — Progress Notes (Signed)
Patient seen and examined for hyperextension of knee. Having gait dysfunction foot drop and multiple complications with knee since 2022. Would like to discuss possibility of revision as he is concerned hardware is malfunctioning. Referred to orthopedics after discussing with Dr. Cornelius Moras.     Carlis Abbott, DO

## 2022-09-15 ENCOUNTER — Other Ambulatory Visit: Payer: Self-pay

## 2022-09-15 ENCOUNTER — Encounter (INDEPENDENT_AMBULATORY_CARE_PROVIDER_SITE_OTHER): Payer: Self-pay | Admitting: ORTHOPEDIC, SPORTS MEDICINE

## 2022-09-15 ENCOUNTER — Ambulatory Visit (INDEPENDENT_AMBULATORY_CARE_PROVIDER_SITE_OTHER): Payer: Self-pay

## 2022-09-15 ENCOUNTER — Ambulatory Visit (INDEPENDENT_AMBULATORY_CARE_PROVIDER_SITE_OTHER): Payer: Medicare Other | Admitting: ORTHOPEDIC, SPORTS MEDICINE

## 2022-09-15 DIAGNOSIS — Z96651 Presence of right artificial knee joint: Secondary | ICD-10-CM

## 2022-09-15 DIAGNOSIS — M21861 Other specified acquired deformities of right lower leg: Secondary | ICD-10-CM

## 2022-09-15 NOTE — H&P (Signed)
[facility]  [address]  [place] [address]    History and Physical    Name: Gary White MRN:  [id number]   Date: 09/15/2022 DOB:  [birth year] (76 y.o.)         Chief Complaint: Knee Pain (Right knee pain, xray completed 09/07/2022. History of right and left total knee arthroplasty. )    History of Present Illness   Gary White is a 77 y.o. year old male who comes to clinic for evaluation of his right knee.  This was replaced at an outside facility 7 years ago.  He states he has had problems with it ever since.  The knee feels unstable and hyperextends.  He is currently wearing a hinged brace and using a walker and attributes all of this to instability and pain of the right knee.  The knee does not ever swell it just feels unstable.    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)    Joint laxity of right knee     Past Medical History:   Diagnosis Date    DVT (deep venous thrombosis) (CMS HCC)     Essential hypertension     Neuropathy (CMS HCC)     Spinal cord compression (CMS HCC)          Past Surgical History:   Procedure Laterality Date    HX CERVICAL LAMINECTOMY  03/2021    C7-T1    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    LAMINECTOMY  03/2016    L4-5    REPLACEMENT TOTAL KNEE Right 03/2016         Current Outpatient Medications   Medication Sig    finasteride (PROSCAR) 5 mg Oral Tablet Take 1 Tablet (5 mg total) by mouth Once a day    gabapentin (NEURONTIN) 300 mg Oral Capsule Take 1 Capsule (300 mg total) by mouth Four times a day    lisinopriL (PRINIVIL) 40 mg Oral Tablet Take 1 Tablet (40 mg total) by mouth Once a day for 180 days    metoprolol tartrate (LOPRESSOR) 25 mg Oral Tablet Take 1 Tablet (25 mg total) by mouth Twice daily for 180 days    multivitamin with iron Oral  Tablet Take 1 Tablet by mouth Once a day    triamcinolone acetonide (ARISTOCORT A) 0.1 % Ointment Apply topically Twice daily     No Known Allergies  Family Medical History:       Problem Relation (Age of Onset)    Cancer Mother    Heart Disease Father    Macular Degen Mother            Social History     Tobacco Use    Smoking status: Former     Average packs/day: 1 pack/day for 20.0 years (20.0 ttl pk-yrs)     Types: Cigarettes     Start date: 1966    Smokeless tobacco: Never   Substance Use Topics    Alcohol use: Not Currently      Review of Systems  Outside of those noted above all other systems reviewed and are negative  Examination:  BP 124/82   Pulse 82   Ht 1.753 m (5\' 9" )   Wt 90.2 kg (198 lb 11.9 oz)  SpO2 100%   BMI 29.35 kg/m       Head normocephalic atraumatic.  Breathing nonlabored.  Pulse is regular rate.  Abdomen nondistended.  Affect normal.  No acute distress.    Right lower extremity-neurovascularly intact distally.  Incision is well healed.  There is no knee effusion or erythema/warmth.  Passive motion of the knee does achieve a proximally 10 of hyperextension.  He has significant medial laxity in both extension and mid flexion.  Data reviewed:  X-ray right knee from outside facility dated 03/13 reviewed by me.  Right knee arthroplasty hardware is well seated and aligned without signs of failure.  Assessment and Plan  Problem List Items Addressed This Visit    None  Visit Diagnoses       History of total right knee replacement        Hyperextension deformity of right knee              I discussed options with the patient moving forward.  He does have significant hyperextension and mid flexion instability.  I think it is likely he will need revision of the knee which could be as minimal as a polyethylene exchange were require full revision.  I will refer him to my partner Gary White for further evaluation and management of this condition.    Gary Gowda, MD

## 2022-09-16 ENCOUNTER — Telehealth (INDEPENDENT_AMBULATORY_CARE_PROVIDER_SITE_OTHER): Payer: Self-pay | Admitting: Student in an Organized Health Care Education/Training Program

## 2022-09-16 NOTE — Telephone Encounter (Signed)
Pt called and stated that Neurology does not treat the dx that was put on his referral. Pt is wondering if he can be referred to someone who does treat his dx or change the dx on the current referral.    Any questions call [phone]

## 2022-09-16 NOTE — Telephone Encounter (Signed)
Pls refer to neurosurgery [facility]

## 2022-09-16 NOTE — Telephone Encounter (Signed)
Patient notified we will refer to [facility] neurosurgical.

## 2022-09-19 ENCOUNTER — Other Ambulatory Visit (INDEPENDENT_AMBULATORY_CARE_PROVIDER_SITE_OTHER): Payer: Self-pay | Admitting: Student in an Organized Health Care Education/Training Program

## 2022-09-19 DIAGNOSIS — Z9889 Other specified postprocedural states: Secondary | ICD-10-CM

## 2022-09-19 NOTE — Telephone Encounter (Signed)
Referral faxed to [facility] neurosurgeon.

## 2022-09-28 ENCOUNTER — Other Ambulatory Visit: Payer: Self-pay

## 2022-09-28 ENCOUNTER — Encounter (INDEPENDENT_AMBULATORY_CARE_PROVIDER_SITE_OTHER): Payer: Medicare Other | Admitting: Specialist

## 2022-09-28 ENCOUNTER — Ambulatory Visit (INDEPENDENT_AMBULATORY_CARE_PROVIDER_SITE_OTHER): Payer: Medicare Other | Admitting: Specialist

## 2022-09-28 ENCOUNTER — Encounter (INDEPENDENT_AMBULATORY_CARE_PROVIDER_SITE_OTHER): Payer: Self-pay | Admitting: Specialist

## 2022-09-28 VITALS — BP 118/64 | HR 75 | Temp 97.3°F | Ht 69.0 in | Wt 199.2 lb

## 2022-09-28 DIAGNOSIS — Z96651 Presence of right artificial knee joint: Secondary | ICD-10-CM

## 2022-09-28 DIAGNOSIS — M25361 Other instability, right knee: Secondary | ICD-10-CM

## 2022-09-28 DIAGNOSIS — T849XXA Unspecified complication of internal orthopedic prosthetic device, implant and graft, initial encounter: Secondary | ICD-10-CM

## 2022-09-28 NOTE — Progress Notes (Signed)
[facility]  [address]  [place] [address]    History and Physical    Name: Gary White MRN:  [id number]   Date: 09/28/2022 DOB:  Jul 04, 1945 (76 y.o.)         Chief Complaint: Re-eval (Right knee discuss possible revision per Dr. Cornelius Moras)    History of Present Illness   Mr. Chaban is a 77 y.o. year old male who comes to clinic for evaluation of his right knee.  This was replaced at an outside facility 7 years ago.  He states he has had problems with it ever since.  The knee feels unstable and hyperextends.  He is currently wearing a hinged brace and using a walker and attributes all of this to instability and pain of the right knee.  The knee does not ever swell it just feels unstable.    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)    Joint laxity of right knee     Past Medical History:   Diagnosis Date    DVT (deep venous thrombosis) (CMS HCC)     Essential hypertension     Neuropathy (CMS HCC)     Spinal cord compression (CMS HCC)          Past Surgical History:   Procedure Laterality Date    HX CERVICAL LAMINECTOMY  03/2021    C7-T1    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    LAMINECTOMY  03/2016    L4-5    REPLACEMENT TOTAL KNEE Right 03/2016         Current Outpatient Medications   Medication Sig    finasteride (PROSCAR) 5 mg Oral Tablet Take 1 Tablet (5 mg total) by mouth Once a day    gabapentin (NEURONTIN) 300 mg Oral Capsule Take 1 Capsule (300 mg total) by mouth Four times a day    lisinopriL (PRINIVIL) 40 mg Oral Tablet Take 1 Tablet (40 mg total) by mouth Once a day for 180 days    metoprolol tartrate (LOPRESSOR) 25 mg Oral Tablet Take 1 Tablet (25 mg total) by mouth Twice daily for 180 days    multivitamin with iron Oral Tablet Take 1 Tablet by mouth Once a day     triamcinolone acetonide (ARISTOCORT A) 0.1 % Ointment Apply topically Twice daily     No Known Allergies  Family Medical History:       Problem Relation (Age of Onset)    Cancer Mother    Heart Disease Father    Macular Degen Mother            Social History     Tobacco Use    Smoking status: Former     Average packs/day: 1 pack/day for 20.0 years (20.0 ttl pk-yrs)     Types: Cigarettes     Start date: 1966    Smokeless tobacco: Never   Substance Use Topics    Alcohol use: Not Currently      Review of Systems  Outside of those noted above all other systems reviewed and are negative  Examination:  BP 118/64 (Site: Right, Patient Position: Sitting)   Pulse 75   Temp 36.3 C (97.3 F) (Temporal)   Ht 1.753 m (5\' 9" )   Wt 90.4  kg (199 lb 3 oz)   SpO2 97%   BMI 29.41 kg/m       Head normocephalic atraumatic.  Breathing nonlabored.  Pulse is regular rate.  Abdomen nondistended.  Affect normal.  No acute distress.    Right lower extremity-neurovascularly intact distally.  Incision is well healed.  There is no knee effusion or erythema/warmth.  Passive motion of the knee does achieve approximately 10 of hyperextension.  He has significant medial laxity (5-6 mm) in both extension and mid flexion.  Data reviewed:  X-ray right knee from outside facility dated 03/13 reviewed by me.  Right knee arthroplasty hardware is well seated and aligned without signs of failure, possible lucency behind the femoral component.  Assessment and Plan  Problem List Items Addressed This Visit    None    Plan:  Right knee revision, possible polyethylene exchange    I discussed options with the patient moving forward.  He does have significant hyperextension and mid flexion instability.  I think it is likely he will need revision of the knee which could be as minimal as a polyethylene exchange but might require full revision.      Leodis Liverpool, MD

## 2022-10-03 ENCOUNTER — Other Ambulatory Visit (INDEPENDENT_AMBULATORY_CARE_PROVIDER_SITE_OTHER): Payer: Self-pay | Admitting: Student in an Organized Health Care Education/Training Program

## 2022-10-03 NOTE — Telephone Encounter (Signed)
Last scheduled appointment with you was 09/01/2022.  Currently scheduled future appointment is 03/08/2023.      Gary White Gary Meeker, LPN  03/01/7075, 15:18    [facility]    Requesting a 90 day script.

## 2022-10-04 MED ORDER — GABAPENTIN 300 MG CAPSULE
300.0000 mg | ORAL_CAPSULE | Freq: Four times a day (QID) | ORAL | 0 refills | Status: DC
Start: 2022-10-04 — End: 2022-11-03

## 2022-10-11 ENCOUNTER — Encounter (INDEPENDENT_AMBULATORY_CARE_PROVIDER_SITE_OTHER): Payer: Self-pay

## 2022-10-26 ENCOUNTER — Encounter (HOSPITAL_COMMUNITY): Payer: Self-pay

## 2022-10-26 ENCOUNTER — Encounter (INDEPENDENT_AMBULATORY_CARE_PROVIDER_SITE_OTHER): Payer: Self-pay | Admitting: Specialist

## 2022-10-26 ENCOUNTER — Inpatient Hospital Stay
Admission: RE | Admit: 2022-10-26 | Discharge: 2022-10-26 | Disposition: A | Discharge status: Home / Self Care | Payer: Medicare Other | Source: Ambulatory Visit | Attending: Specialist | Admitting: Specialist

## 2022-10-26 ENCOUNTER — Ambulatory Visit (INDEPENDENT_AMBULATORY_CARE_PROVIDER_SITE_OTHER): Payer: Medicare Other | Admitting: Specialist

## 2022-10-26 ENCOUNTER — Inpatient Hospital Stay (HOSPITAL_COMMUNITY)
Admission: RE | Admit: 2022-10-26 | Discharge: 2022-10-26 | Disposition: A | Discharge status: Home / Self Care | Payer: Medicare Other | Source: Ambulatory Visit

## 2022-10-26 ENCOUNTER — Other Ambulatory Visit: Payer: Self-pay

## 2022-10-26 VITALS — BP 128/74 | HR 91 | Ht 69.0 in | Wt 202.7 lb

## 2022-10-26 DIAGNOSIS — Z01818 Encounter for other preprocedural examination: Secondary | ICD-10-CM | POA: Insufficient documentation

## 2022-10-26 HISTORY — DX: Presence of spectacles and contact lenses: Z97.3

## 2022-10-26 HISTORY — DX: Other chronic pain: G89.29

## 2022-10-26 LAB — CBC WITH DIFF
BASOPHIL #: <0.1 10*3/uL (ref <=0.20)
BASOPHIL %: 1 %
EOSINOPHIL #: 0.43 10*3/uL (ref <=0.50)
EOSINOPHIL %: 7 %
HCT: 40.8 % (ref 38.9–52.0)
HGB: 14.3 g/dL (ref 13.4–17.5)
IMMATURE GRANULOCYTE #: <0.1 10*3/uL (ref <0.10)
IMMATURE GRANULOCYTE %: 0 % (ref 0.0–1.0)
LYMPHOCYTE #: 0.87 10*3/uL — ABNORMAL LOW (ref 1.00–4.80)
LYMPHOCYTE %: 14 %
MCH: 32.3 pg — ABNORMAL HIGH (ref 26.0–32.0)
MCHC: 35 g/dL (ref 31.0–35.5)
MCV: 92.1 fL (ref 78.0–100.0)
MONOCYTE #: 0.41 10*3/uL (ref 0.20–1.10)
MONOCYTE %: 7 %
NEUTROPHIL #: 4.34 10*3/uL (ref 1.50–7.70)
NEUTROPHIL %: 71 %
PLATELETS: 118 10*3/uL — ABNORMAL LOW (ref 150–400)
RBC: 4.43 10*6/uL — ABNORMAL LOW (ref 4.50–6.10)
RDW-CV: 13.8 % (ref 11.5–15.5)
WBC: 6.1 10*3/uL (ref 3.7–11.0)

## 2022-10-26 LAB — BASIC METABOLIC PANEL
ANION GAP: 10 mmol/L (ref 4–13)
BUN/CREA RATIO: 16 (ref 6–22)
BUN: 12 mg/dL (ref 8–25)
CALCIUM: 10 mg/dL (ref 8.6–10.3)
CHLORIDE: 108 mmol/L (ref 96–111)
CO2 TOTAL: 22 mmol/L — ABNORMAL LOW (ref 23–31)
CREATININE: 0.74 mg/dL — ABNORMAL LOW (ref 0.75–1.35)
ESTIMATED GFR - MALE: >90 mL/min/BSA (ref >=60)
GLUCOSE: 110 mg/dL (ref 65–125)
POTASSIUM: 4.4 mmol/L (ref 3.5–5.1)
SODIUM: 140 mmol/L (ref 136–145)

## 2022-10-26 NOTE — Result Encounter Note (Signed)
All results normal. Some of the values can appear to be abnormal but still be within acceptable range for you.

## 2022-10-26 NOTE — Progress Notes (Signed)
[facility]  [address]  [facility] [address]    History and Physical    Name: Gary White MRN:  [id number]   Date: 10/26/2022 DOB:  Aug 08, 1945 (77 y.o.)         Chief Complaint: Pre-OP H & P (Revision right total knee arthroplasty possible hinge 09/07/22)    History of Present Illness   Gary White is a 77 y.o. year old male who comes to clinic for evaluation of his right knee.  This was replaced at an outside facility 7 years ago.  He states he has had problems with it ever since.  The knee feels unstable and hyperextends.  He is currently wearing a hinged brace and using a walker and attributes all of this to instability and pain of the right knee.  The knee does not ever swell it just feels unstable.    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)    Joint laxity of right knee     Past Medical History:   Diagnosis Date    DVT (deep venous thrombosis) (CMS HCC)     Essential hypertension     Neuropathy (CMS HCC)     Spinal cord compression (CMS HCC)          Past Surgical History:   Procedure Laterality Date    HX CERVICAL LAMINECTOMY  03/2021    C7-T1    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    LAMINECTOMY  03/2016    L4-5    REPLACEMENT TOTAL KNEE Right 03/2016         Current Outpatient Medications   Medication Sig    finasteride (PROSCAR) 5 mg Oral Tablet Take 1 Tablet (5 mg total) by mouth Once a day    gabapentin (NEURONTIN) 300 mg Oral Capsule Take 1 Capsule (300 mg total) by mouth Four times a day    lisinopriL (PRINIVIL) 40 mg Oral Tablet Take 1 Tablet (40 mg total) by mouth Once a day for 180 days    metoprolol tartrate (LOPRESSOR) 25 mg Oral Tablet Take 1 Tablet (25 mg total) by mouth Twice daily for 180 days    multivitamin with iron Oral Tablet Take 1 Tablet by mouth Once  a day    triamcinolone acetonide (ARISTOCORT A) 0.1 % Ointment Apply topically Twice daily     No Known Allergies  Family Medical History:       Problem Relation (Age of Onset)    Cancer Mother    Heart Disease Father    Macular Degen Mother            Social History     Tobacco Use    Smoking status: Former     Average packs/day: 1 pack/day for 20.0 years (20.0 ttl pk-yrs)     Types: Cigarettes     Start date: 1966    Smokeless tobacco: Never   Substance Use Topics    Alcohol use: Not Currently      Review of Systems  Outside of those noted above all other systems reviewed and are negative  Examination:  BP 128/74   Pulse 91   Ht 1.753 m (5\' 9" )   Wt 92 kg (202 lb 11.4 oz)   SpO2 99%  BMI 29.94 kg/m       Head normocephalic atraumatic.  Breathing nonlabored.  Pulse is regular rate.  Abdomen nondistended.  Affect normal.  No acute distress.    Right lower extremity-neurovascularly intact distally.  Incision is well healed.  There is no knee effusion or erythema/warmth.  Passive motion of the knee does achieve approximately 10 of hyperextension.  He has significant medial laxity (5-6 mm) in both extension and mid flexion.  Data reviewed:  X-ray right knee from outside facility dated 03/13 reviewed by me.  Right knee arthroplasty hardware is well seated and aligned without signs of failure, possible lucency behind the femoral component.  Assessment and Plan  Problem List Items Addressed This Visit    None  Visit Diagnoses       Pre-op testing    -  Primary    Relevant Orders    BASIC METABOLIC PANEL    CBC/DIFF    ECG 12 LEAD    MRSA (RULE OUT) SURVEILLANCE    XR CHEST AP AND LATERAL          Plan:  Right knee revision, possible polyethylene exchange    I discussed options with the patient moving forward.  He does have significant hyperextension and mid flexion instability.  I think it is likely he will need revision of the knee which could be as minimal as a polyethylene exchange but might require full  revision.  This surgical procedure, as well as the risks and alternatives, were thoroughly covered with the patient and/or family.  The risks include, but were not exclusive to, infection, bleeding, nerve damage, damage to the bone and surrounding soft tissues, continued pain, stiffness, need for further surgery, and anesthesia risks.  The patient and/or family voiced understanding of the proposed procedure, the risks and alternatives of the surgery, and desired to proceed, questions were answered to the patient's and/or family's satisfaction.     Antoine Primas, MD

## 2022-10-26 NOTE — Anesthesia Preprocedure Evaluation (Addendum)
ANESTHESIA PRE-OP EVALUATION  Planned Procedure: REVISION RIGHT TOTAL KNEE ARTHROPLASTY POSSIBLE HINGE (Right)  Review of Systems     anesthesia history negative               Pulmonary   past history of smoking  (Quit 1986),  no COPD, no asthma, no shortness of breath, no sleep apnea and not a current smoker   Cardiovascular    Hypertension, ECG reviewed, EKG- Sinus , ACE / ARB inhibitor use (Instructed to not take DOS) and DVT (L DVT after knee replacement) ,No past MI, no dysrhythmias and no angina,  Exercise Tolerance: <4 METS   ,beta blocker therapy      GI/Hepatic/Renal    no GERD, no liver disease and no renal insufficiency        Endo/Other    no hypothyroidism and no drug induced coagulopathy   no type 2 diabetes,    Neuro/Psych/MS    Hx of cervical spine surgery , back 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  Facial hair  No Beard        Dental       Dentition intact             Pulmonary    Breath sounds clear to auscultation       Cardiovascular    Rhythm: regular  Rate: Normal  (-) no friction rub and no murmur     Other findings              Plan  ASA 2     Planned anesthesia type: general     general anesthesia with laryngeal mask airway                    Intravenous induction     Anesthesia issues/risks discussed are: Sore Throat, PONV, Dental Injuries, Failure of Block, Cardiac Events/MI and Stroke.  Anesthetic plan and risks discussed with patient  signed consent obtained          Patient's NPO status is appropriate for Anesthesia.           Plan discussed with CRNA.

## 2022-10-27 DIAGNOSIS — Z01818 Encounter for other preprocedural examination: Secondary | ICD-10-CM

## 2022-10-27 LAB — ECG 12 LEAD
Atrial Rate: 64 {beats}/min
Calculated P Axis: 39 degrees
Calculated R Axis: -24 degrees
Calculated T Axis: 58 degrees
PR Interval: 158 ms
QRS Duration: 92 ms
QT Interval: 396 ms
QTC Calculation: 408 ms
Ventricular rate: 64 {beats}/min

## 2022-10-29 LAB — MRSA (RULE OUT) SURVEILLANCE

## 2022-11-01 ENCOUNTER — Other Ambulatory Visit: Payer: Self-pay

## 2022-11-01 NOTE — Nurses Notes (Addendum)
Met with patient in PAT to provide education.  The patient is scheduled to have a Revision Right TKA possible hinge on 11/09/22.  The patient plans to be admitted.       History of Right TKA in 2017.  The patient wears a AFO on the Right due to a history of a foot drop.  Patient reports his foot drop is related to his back surgery.     The patient had a Left TKA in 2009 and had a DVT post op.         D/C PLAN    Patient lives alone or with someone. The patient lives alone.      Will you have assistance at home and can someone provide you with transportation, who?  The patient's son will stay with the patient while he recovers.  The patient's son will provide the patient with transportation as well.        Do you have a Front wheeled walker?  The patient has a Rolator and will bring it the day of surgery.      How many levels is your home?  The patient lives in an one level home.      How many steps? Do you have a Railing?  The patient has two steps to get into his home. He has a Best boy the right side of his steps.    Where are you planning to go to for your outpatient rehab? Is an appointment already set up?  Date of appointment    The patient plans to go to [facility] for his outpatient physical therapy.  The patient does not have a post op appointment. [employer])

## 2022-11-03 ENCOUNTER — Other Ambulatory Visit (INDEPENDENT_AMBULATORY_CARE_PROVIDER_SITE_OTHER): Payer: Self-pay | Admitting: Student in an Organized Health Care Education/Training Program

## 2022-11-03 ENCOUNTER — Encounter (INDEPENDENT_AMBULATORY_CARE_PROVIDER_SITE_OTHER): Payer: Self-pay | Admitting: Student in an Organized Health Care Education/Training Program

## 2022-11-03 MED ORDER — GABAPENTIN 300 MG CAPSULE
300.0000 mg | ORAL_CAPSULE | Freq: Four times a day (QID) | ORAL | 2 refills | Status: DC
Start: 2022-11-03 — End: 2022-11-03

## 2022-11-03 MED ORDER — GABAPENTIN 300 MG CAPSULE
300.0000 mg | ORAL_CAPSULE | Freq: Four times a day (QID) | ORAL | 2 refills | Status: DC
Start: 2022-11-03 — End: 2023-02-13

## 2022-11-03 NOTE — Telephone Encounter (Signed)
Last scheduled appointment with you was 09/01/2022.  Currently scheduled future appointment is 03/08/2023.      Medication Dispense Information    GABAPENTIN 300MG     CAP   Sig: TAKE 1 CAPSULE BY MOUTH 4 TIMES DAILY   Dispensed: 10/05/2022 12:00 AM   Days supply: 30   Quantity: 120 Each   Refills remaining: 0   Pharmacy: [facility] [address] [place] - [address]  [address]  [place] [address]  Phone: [phone]  Fax: [phone]   Authorizing provider: Wilmon Pali, MD  [address]  [place] [address]  Phone: [phone]  Fax: [phone]   Received from: [facility] (Fill History, Ambulatory)   Brand or Generic: Generic     Webb Laws, RN  11/03/2022, 13:44

## 2022-11-03 NOTE — Telephone Encounter (Signed)
Yes

## 2022-11-03 NOTE — Telephone Encounter (Signed)
Okay for 90 day refill of both?

## 2022-11-03 NOTE — Telephone Encounter (Signed)
RX pending to be sent to pharmacy.   Vasco Chong, LPN

## 2022-11-03 NOTE — Telephone Encounter (Signed)
-----   Message from [place] E. Cisnero sent at 11/03/2022 11:06 AM EDT -----  Regarding: Prescription  Contact: [phone]  Please refill Gabapentin 300mg . 90 day script please.  I also need a refill on Finasteride 5mg . Dosage 1 per day.  90 Day Script please.  Thanks. [name]

## 2022-11-06 ENCOUNTER — Encounter (INDEPENDENT_AMBULATORY_CARE_PROVIDER_SITE_OTHER): Payer: Self-pay

## 2022-11-08 ENCOUNTER — Other Ambulatory Visit (INDEPENDENT_AMBULATORY_CARE_PROVIDER_SITE_OTHER): Payer: Self-pay | Admitting: Student in an Organized Health Care Education/Training Program

## 2022-11-08 ENCOUNTER — Encounter (INDEPENDENT_AMBULATORY_CARE_PROVIDER_SITE_OTHER): Payer: Self-pay | Admitting: Student in an Organized Health Care Education/Training Program

## 2022-11-08 DIAGNOSIS — N401 Enlarged prostate with lower urinary tract symptoms: Secondary | ICD-10-CM

## 2022-11-08 NOTE — Telephone Encounter (Signed)
Pt is requesting a refill on Finasteride. This was filled on 07/03/2022 by [facility]. Would you like to take this over?    [address] [place], [place]  11/08/2022, 16:36

## 2022-11-08 NOTE — Telephone Encounter (Signed)
-----   Message from Pelham E. Youngers sent at 11/08/2022  4:30 PM EDT -----  Regarding: Finasteride  Contact: [phone]  Please renew my prescription for finasteride 5mg  1 per day. Please renew for 90 days.  Thanks, Gary White.

## 2022-11-08 NOTE — Telephone Encounter (Signed)
Yes that's okay

## 2022-11-09 ENCOUNTER — Ambulatory Visit (HOSPITAL_COMMUNITY): Payer: Medicare Other | Admitting: Specialist

## 2022-11-09 ENCOUNTER — Ambulatory Visit (HOSPITAL_COMMUNITY): Payer: Medicare Other | Admitting: Acute Care

## 2022-11-09 ENCOUNTER — Ambulatory Visit (HOSPITAL_COMMUNITY)
Admission: RE | Admit: 2022-11-09 | Discharge: 2022-11-09 | Disposition: A | Discharge status: Home / Self Care | Payer: Medicare Other | Source: Ambulatory Visit

## 2022-11-09 ENCOUNTER — Ambulatory Visit (HOSPITAL_COMMUNITY): Payer: Medicare Other | Admitting: Anesthesiology

## 2022-11-09 ENCOUNTER — Ambulatory Visit
Admission: RE | Admit: 2022-11-09 | Discharge: 2022-11-10 | Disposition: A | Discharge status: Home / Self Care | Payer: Medicare Other | Source: Ambulatory Visit | Attending: Specialist | Admitting: Specialist

## 2022-11-09 ENCOUNTER — Encounter (HOSPITAL_COMMUNITY)
Admission: RE | Disposition: A | Discharge status: Home / Self Care | Payer: Self-pay | Source: Ambulatory Visit | Attending: Specialist

## 2022-11-09 ENCOUNTER — Ambulatory Visit (HOSPITAL_COMMUNITY): Payer: Medicare Other | Admitting: Nurse Practitioner

## 2022-11-09 DIAGNOSIS — I10 Essential (primary) hypertension: Secondary | ICD-10-CM | POA: Insufficient documentation

## 2022-11-09 DIAGNOSIS — T8484XA Pain due to internal orthopedic prosthetic devices, implants and grafts, initial encounter: Secondary | ICD-10-CM | POA: Insufficient documentation

## 2022-11-09 DIAGNOSIS — T84022A Instability of internal right knee prosthesis, initial encounter: Secondary | ICD-10-CM | POA: Insufficient documentation

## 2022-11-09 DIAGNOSIS — M1711 Unilateral primary osteoarthritis, right knee: Secondary | ICD-10-CM | POA: Insufficient documentation

## 2022-11-09 DIAGNOSIS — Z96651 Presence of right artificial knee joint: Secondary | ICD-10-CM | POA: Insufficient documentation

## 2022-11-09 DIAGNOSIS — T8489XA Other specified complication of internal orthopedic prosthetic devices, implants and grafts, initial encounter: Secondary | ICD-10-CM | POA: Insufficient documentation

## 2022-11-09 DIAGNOSIS — Z86718 Personal history of other venous thrombosis and embolism: Secondary | ICD-10-CM | POA: Insufficient documentation

## 2022-11-09 DIAGNOSIS — Z87891 Personal history of nicotine dependence: Secondary | ICD-10-CM | POA: Insufficient documentation

## 2022-11-09 DIAGNOSIS — Z8739 Personal history of other diseases of the musculoskeletal system and connective tissue: Secondary | ICD-10-CM

## 2022-11-09 HISTORY — PX: KNEE SURGERY: SHX244

## 2022-11-09 LAB — TYPE AND SCREEN
ABO/RH(D): B POS
ANTIBODY SCREEN: NEGATIVE

## 2022-11-09 SURGERY — ARTHROPLASTY KNEE  REVISION
Anesthesia: General | Site: Knee | Laterality: Right | Wound class: Clean Wound: Uninfected operative wounds in which no inflammation occurred

## 2022-11-09 MED ORDER — INSULIN REGULAR 100 UNIT/ML SUB-Q SSIP VIAL
0.0000 [IU] | INJECTION | Freq: Once | SUBCUTANEOUS | Status: DC | PRN
Start: 2022-11-09 — End: 2022-11-09

## 2022-11-09 MED ORDER — HYDROMORPHONE (PF) 0.5 MG/0.5 ML INJECTION SYRINGE
INJECTION | INTRAMUSCULAR | Status: AC
Start: 2022-11-09 — End: 2022-11-09
  Filled 2022-11-09: qty 0.5

## 2022-11-09 MED ORDER — FINASTERIDE 5 MG TABLET
5.0000 mg | ORAL_TABLET | Freq: Every day | ORAL | 1 refills | Status: DC
Start: 2022-11-09 — End: 2023-03-08

## 2022-11-09 MED ORDER — OXYCODONE-ACETAMINOPHEN 5 MG-325 MG TABLET
1.0000 | ORAL_TABLET | ORAL | 0 refills | Status: DC | PRN
Start: 2022-11-09 — End: 2022-12-30

## 2022-11-09 MED ORDER — MIDAZOLAM 1 MG/ML INJECTION SOLUTION
Freq: Once | INTRAMUSCULAR | Status: DC | PRN
Start: 2022-11-09 — End: 2022-11-09
  Administered 2022-11-09: 2 mg via INTRAVENOUS

## 2022-11-09 MED ORDER — PROPOFOL 10 MG/ML INTRAVENOUS EMULSION
INTRAVENOUS | Status: AC
Start: 2022-11-09 — End: 2022-11-09
  Filled 2022-11-09: qty 20

## 2022-11-09 MED ORDER — ACETAMINOPHEN 500 MG TABLET
1000.0000 mg | ORAL_TABLET | Freq: Once | ORAL | Status: AC
Start: 2022-11-09 — End: 2022-11-09
  Administered 2022-11-09: 1000 mg via ORAL
  Filled 2022-11-09: qty 2

## 2022-11-09 MED ORDER — ROPIVACAINE (PF) 5 MG/ML (0.5 %) INJECTION SOLUTION
INTRAMUSCULAR | Status: AC
Start: 2022-11-09 — End: 2022-11-09
  Filled 2022-11-09: qty 30

## 2022-11-09 MED ORDER — DEXAMETHASONE SODIUM PHOSPHATE 4 MG/ML INJECTION SOLUTION
INTRAMUSCULAR | Status: AC
Start: 2022-11-09 — End: 2022-11-09
  Filled 2022-11-09: qty 1

## 2022-11-09 MED ORDER — SODIUM CHLORIDE 0.9 % (FLUSH) INJECTION SYRINGE
3.0000 mL | INJECTION | Freq: Three times a day (TID) | INTRAMUSCULAR | Status: DC
Start: 2022-11-09 — End: 2022-11-09
  Administered 2022-11-09: 0 mL

## 2022-11-09 MED ORDER — IPRATROPIUM 0.5 MG-ALBUTEROL 3 MG (2.5 MG BASE)/3 ML NEBULIZATION SOLN
3.0000 mL | INHALATION_SOLUTION | Freq: Once | RESPIRATORY_TRACT | Status: DC | PRN
Start: 2022-11-09 — End: 2022-11-09

## 2022-11-09 MED ORDER — SIMETHICONE 80 MG CHEWABLE TABLET
80.0000 mg | CHEWABLE_TABLET | Freq: Three times a day (TID) | ORAL | Status: DC | PRN
Start: 2022-11-09 — End: 2022-11-10

## 2022-11-09 MED ORDER — DEXTROSE 50 % IN WATER (D50W) INTRAVENOUS SYRINGE
25.0000 g | INJECTION | INTRAVENOUS | Status: DC | PRN
Start: 2022-11-09 — End: 2022-11-09

## 2022-11-09 MED ORDER — EPINEPHRINE HCL (PF) 1 MG/ML (1 ML) INJECTION SOLUTION
Freq: Once | INTRAMUSCULAR | Status: DC | PRN
Start: 2022-11-09 — End: 2022-11-09
  Administered 2022-11-09: .5 mL via INTRAMUSCULAR

## 2022-11-09 MED ORDER — HYDROMORPHONE (PF) 0.5 MG/0.5 ML INJECTION SYRINGE
0.2500 mg | INJECTION | INTRAMUSCULAR | Status: DC | PRN
Start: 2022-11-09 — End: 2022-11-09

## 2022-11-09 MED ORDER — ONDANSETRON HCL (PF) 4 MG/2 ML INJECTION SOLUTION
Freq: Once | INTRAMUSCULAR | Status: DC | PRN
Start: 2022-11-09 — End: 2022-11-09
  Administered 2022-11-09: 4 mg via INTRAVENOUS

## 2022-11-09 MED ORDER — MORPHINE 4 MG/ML INTRAVENOUS SOLUTION
4.0000 mg | INTRAVENOUS | Status: DC | PRN
Start: 2022-11-09 — End: 2022-11-09

## 2022-11-09 MED ORDER — OXYCODONE-ACETAMINOPHEN 5 MG-325 MG TABLET
2.0000 | ORAL_TABLET | ORAL | Status: DC | PRN
Start: 2022-11-09 — End: 2022-11-10
  Administered 2022-11-09 – 2022-11-10 (×3): 2 via ORAL
  Filled 2022-11-09 (×2): qty 2

## 2022-11-09 MED ORDER — NALOXONE 1 MG/ML INJECTION SYRINGE
1.0000 mg | INJECTION | INTRAMUSCULAR | Status: DC | PRN
Start: 2022-11-09 — End: 2022-11-10

## 2022-11-09 MED ORDER — PHENYLEPHRINE 1 MG/10 ML (100 MCG/ML) IN 0.9 % SOD.CHLORIDE IV SYRINGE
INJECTION | Freq: Once | INTRAVENOUS | Status: DC | PRN
Start: 2022-11-09 — End: 2022-11-09
  Administered 2022-11-09: 100 ug via INTRAVENOUS

## 2022-11-09 MED ORDER — TRANEXAMIC ACID 650 MG TABLET
1300.0000 mg | ORAL_TABLET | Freq: Once | ORAL | Status: AC
Start: 2022-11-09 — End: 2022-11-09
  Administered 2022-11-09: 1300 mg via ORAL
  Filled 2022-11-09: qty 2

## 2022-11-09 MED ORDER — SODIUM CHLORIDE 0.9 % (FLUSH) INJECTION SYRINGE
3.0000 mL | INJECTION | Freq: Three times a day (TID) | INTRAMUSCULAR | Status: DC
Start: 2022-11-09 — End: 2022-11-10
  Administered 2022-11-09 – 2022-11-10 (×3): 0 mL

## 2022-11-09 MED ORDER — MULTIVITAMIN WITH FOLIC ACID 400 MCG TABLET
1.0000 | ORAL_TABLET | Freq: Every day | ORAL | Status: DC
Start: 2022-11-09 — End: 2022-11-10
  Administered 2022-11-09 – 2022-11-10 (×2): 1 via ORAL
  Filled 2022-11-09 (×2): qty 1

## 2022-11-09 MED ORDER — ROPIVACAINE (PF) 2 MG/ML (0.2 %) INJECTION SOLUTION
INTRAMUSCULAR | Status: AC
Start: 2022-11-09 — End: 2022-11-09
  Filled 2022-11-09: qty 60

## 2022-11-09 MED ORDER — ONDANSETRON HCL (PF) 4 MG/2 ML INJECTION SOLUTION
4.0000 mg | Freq: Once | INTRAMUSCULAR | Status: DC | PRN
Start: 2022-11-09 — End: 2022-11-09

## 2022-11-09 MED ORDER — GABAPENTIN 300 MG CAPSULE
300.0000 mg | ORAL_CAPSULE | Freq: Three times a day (TID) | ORAL | Status: DC
Start: 2022-11-09 — End: 2022-11-10
  Administered 2022-11-09 – 2022-11-10 (×2): 300 mg via ORAL
  Filled 2022-11-09 (×2): qty 1

## 2022-11-09 MED ORDER — LACTATED RINGERS INTRAVENOUS SOLUTION
INTRAVENOUS | Status: DC
Start: 2022-11-09 — End: 2022-11-09
  Administered 2022-11-09: 0 mL via INTRAVENOUS

## 2022-11-09 MED ORDER — DOCUSATE SODIUM 100 MG CAPSULE
100.0000 mg | ORAL_CAPSULE | Freq: Two times a day (BID) | ORAL | Status: DC
Start: 2022-11-09 — End: 2022-11-10
  Administered 2022-11-09 – 2022-11-10 (×2): 100 mg via ORAL
  Filled 2022-11-09 (×2): qty 1

## 2022-11-09 MED ORDER — SODIUM CHLORIDE 0.9 % (FLUSH) INJECTION SYRINGE
3.0000 mL | INJECTION | INTRAMUSCULAR | Status: DC | PRN
Start: 2022-11-09 — End: 2022-11-10

## 2022-11-09 MED ORDER — IBUPROFEN 600 MG TABLET
600.0000 mg | ORAL_TABLET | Freq: Four times a day (QID) | ORAL | Status: DC | PRN
Start: 2022-11-09 — End: 2022-11-09

## 2022-11-09 MED ORDER — CEFAZOLIN 2 GRAM/100 ML IN DEXTROSE(ISO-OSMOTIC) INTRAVENOUS PIGGYBACK
2.0000 g | INJECTION | Freq: Once | INTRAVENOUS | Status: AC
Start: 2022-11-09 — End: 2022-11-09
  Administered 2022-11-09: 2 g via INTRAVENOUS
  Filled 2022-11-09: qty 100

## 2022-11-09 MED ORDER — EPINEPHRINE HCL (PF) 1 MG/ML (1 ML) INJECTION SOLUTION
INTRAMUSCULAR | Status: AC
Start: 2022-11-09 — End: 2022-11-09
  Filled 2022-11-09: qty 1

## 2022-11-09 MED ORDER — SODIUM CHLORIDE 0.9 % IRRIGATION SOLUTION
Freq: Once | Status: DC | PRN
Start: 2022-11-09 — End: 2022-11-09
  Administered 2022-11-09: 3000 mL
  Administered 2022-11-09: 1000 mL

## 2022-11-09 MED ORDER — LACTATED RINGERS INTRAVENOUS SOLUTION
INTRAVENOUS | Status: DC
Start: 2022-11-09 — End: 2022-11-09

## 2022-11-09 MED ORDER — PROPOFOL 10 MG/ML IV BOLUS
INJECTION | Freq: Once | INTRAVENOUS | Status: DC | PRN
Start: 2022-11-09 — End: 2022-11-09
  Administered 2022-11-09: 150 mg via INTRAVENOUS

## 2022-11-09 MED ORDER — HYDROMORPHONE (PF) 0.5 MG/0.5 ML INJECTION SYRINGE
0.2500 mg | INJECTION | INTRAMUSCULAR | Status: DC | PRN
Start: 2022-11-09 — End: 2022-11-10

## 2022-11-09 MED ORDER — ROPIVACAINE (PF) 2 MG/ML (0.2 %) INJECTION SOLUTION
Freq: Once | INTRAMUSCULAR | Status: DC | PRN
Start: 2022-11-09 — End: 2022-11-09
  Administered 2022-11-09: 60 mL

## 2022-11-09 MED ORDER — SODIUM CHLORIDE 0.9 % (FLUSH) INJECTION SYRINGE
3.0000 mL | INJECTION | INTRAMUSCULAR | Status: DC | PRN
Start: 2022-11-09 — End: 2022-11-09

## 2022-11-09 MED ORDER — OXYCODONE-ACETAMINOPHEN 5 MG-325 MG TABLET
1.0000 | ORAL_TABLET | ORAL | Status: DC | PRN
Start: 2022-11-09 — End: 2022-11-10
  Filled 2022-11-09 (×2): qty 1

## 2022-11-09 MED ORDER — OXYCODONE ER 10 MG TABLET,CRUSH RESISTANT,EXTENDED RELEASE 12 HR
EXTENDED_RELEASE_ORAL_TABLET | ORAL | Status: AC
Start: 2022-11-09 — End: 2022-11-09
  Filled 2022-11-09: qty 1

## 2022-11-09 MED ORDER — ONDANSETRON 4 MG DISINTEGRATING TABLET
4.0000 mg | ORAL_TABLET | Freq: Three times a day (TID) | ORAL | 0 refills | Status: AC | PRN
Start: 2022-11-09 — End: ?

## 2022-11-09 MED ORDER — NALOXONE 1 MG/ML INJECTION SYRINGE
1.0000 mg | INJECTION | INTRAMUSCULAR | Status: DC | PRN
Start: 2022-11-09 — End: 2022-11-09

## 2022-11-09 MED ORDER — LACTATED RINGERS INTRAVENOUS SOLUTION
INTRAVENOUS | Status: DC
Start: 2022-11-09 — End: 2022-11-10

## 2022-11-09 MED ORDER — SODIUM CHLORIDE 0.9 % (FLUSH) INJECTION SYRINGE
3.0000 mL | INJECTION | Freq: Three times a day (TID) | INTRAMUSCULAR | Status: DC
Start: 2022-11-09 — End: 2022-11-09

## 2022-11-09 MED ORDER — CEFAZOLIN 2 GRAM/100 ML IN DEXTROSE(ISO-OSMOTIC) INTRAVENOUS PIGGYBACK
2.0000 g | INJECTION | Freq: Three times a day (TID) | INTRAVENOUS | Status: DC
Start: 2022-11-09 — End: 2022-11-10
  Administered 2022-11-09: 2 g via INTRAVENOUS
  Administered 2022-11-09 – 2022-11-10 (×3): 0 g via INTRAVENOUS
  Administered 2022-11-10: 2 g via INTRAVENOUS
  Filled 2022-11-09 (×3): qty 100

## 2022-11-09 MED ORDER — ONDANSETRON HCL (PF) 4 MG/2 ML INJECTION SOLUTION
INTRAMUSCULAR | Status: AC
Start: 2022-11-09 — End: 2022-11-09
  Filled 2022-11-09: qty 2

## 2022-11-09 MED ORDER — PHENYLEPHRINE 1 MG/10 ML (100 MCG/ML) IN 0.9 % SOD.CHLORIDE IV SYRINGE
INJECTION | INTRAVENOUS | Status: AC
Start: 2022-11-09 — End: 2022-11-09
  Filled 2022-11-09: qty 10

## 2022-11-09 MED ORDER — ALUMINUM-MAG HYDROXIDE-SIMETHICONE 200 MG-200 MG-20 MG/5 ML ORAL SUSP
30.0000 mL | ORAL | Status: DC | PRN
Start: 2022-11-09 — End: 2022-11-10

## 2022-11-09 MED ORDER — NALOXONE 0.4 MG/ML INJECTION SOLUTION
0.4000 mg | INTRAMUSCULAR | Status: DC | PRN
Start: 2022-11-09 — End: 2022-11-10

## 2022-11-09 MED ORDER — HYDROMORPHONE (PF) 2 MG/ML INJECTION SOLUTION
INTRAMUSCULAR | Status: AC
Start: 2022-11-09 — End: 2022-11-09
  Filled 2022-11-09: qty 1

## 2022-11-09 MED ORDER — FINASTERIDE 5 MG TABLET
5.0000 mg | ORAL_TABLET | Freq: Every day | ORAL | Status: DC
Start: 2022-11-09 — End: 2022-11-10
  Administered 2022-11-09 – 2022-11-10 (×2): 5 mg via ORAL
  Filled 2022-11-09 (×2): qty 1

## 2022-11-09 MED ORDER — VANCOMYCIN 1,000 MG INTRAVENOUS INJECTION
INTRAVENOUS | Status: AC
Start: 2022-11-09 — End: 2022-11-09
  Filled 2022-11-09: qty 10

## 2022-11-09 MED ORDER — HYDROMORPHONE (PF) 0.5 MG/0.5 ML INJECTION SYRINGE
0.5000 mg | INJECTION | INTRAMUSCULAR | Status: DC | PRN
Start: 2022-11-09 — End: 2022-11-09
  Administered 2022-11-09 (×2): 0.5 mg via INTRAVENOUS

## 2022-11-09 MED ORDER — MIDAZOLAM 1 MG/ML INJECTION WRAPPER
INTRAMUSCULAR | Status: AC
Start: 2022-11-09 — End: 2022-11-09
  Filled 2022-11-09: qty 2

## 2022-11-09 MED ORDER — HYDROMORPHONE 2 MG/ML INJECTION SOLUTION
Freq: Once | INTRAMUSCULAR | Status: DC | PRN
Start: 2022-11-09 — End: 2022-11-09
  Administered 2022-11-09: .25 mg via INTRAVENOUS
  Administered 2022-11-09 (×2): .5 mg via INTRAVENOUS
  Administered 2022-11-09: .25 mg via INTRAVENOUS
  Administered 2022-11-09: .5 mg via INTRAVENOUS

## 2022-11-09 MED ORDER — LACTATED RINGERS INTRAVENOUS SOLUTION
INTRAVENOUS | Status: DC | PRN
Start: 2022-11-09 — End: 2022-11-09

## 2022-11-09 MED ORDER — MAGNESIUM HYDROXIDE 400 MG/5 ML ORAL SUSPENSION
30.0000 mL | Freq: Two times a day (BID) | ORAL | Status: DC | PRN
Start: 2022-11-09 — End: 2022-11-10

## 2022-11-09 MED ORDER — ONDANSETRON HCL (PF) 4 MG/2 ML INJECTION SOLUTION
4.0000 mg | Freq: Four times a day (QID) | INTRAMUSCULAR | Status: DC | PRN
Start: 2022-11-09 — End: 2022-11-10

## 2022-11-09 MED ORDER — DEXAMETHASONE SODIUM PHOSPHATE 4 MG/ML INJECTION SOLUTION
Freq: Once | INTRAMUSCULAR | Status: DC | PRN
Start: 2022-11-09 — End: 2022-11-09
  Administered 2022-11-09: 4 mg via INTRAVENOUS

## 2022-11-09 MED ORDER — LISINOPRIL 40 MG TABLET
40.0000 mg | ORAL_TABLET | Freq: Every day | ORAL | Status: DC
Start: 2022-11-09 — End: 2022-11-10
  Administered 2022-11-09 – 2022-11-10 (×2): 40 mg via ORAL
  Filled 2022-11-09 (×2): qty 1

## 2022-11-09 MED ORDER — OXYCODONE ER 10 MG TABLET,CRUSH RESISTANT,EXTENDED RELEASE 12 HR
10.0000 mg | EXTENDED_RELEASE_ORAL_TABLET | Freq: Once | ORAL | Status: AC
Start: 2022-11-09 — End: 2022-11-09
  Administered 2022-11-09: 10 mg via ORAL

## 2022-11-09 MED ORDER — VANCOMYCIN 1,000 MG IV POWDER - FOR OR
Freq: Once | TOPICAL | Status: DC | PRN
Start: 2022-11-09 — End: 2022-11-09
  Administered 2022-11-09: 1 g via TOPICAL

## 2022-11-09 MED ORDER — FENTANYL (PF) 50 MCG/ML INJECTION SOLUTION
INTRAMUSCULAR | Status: AC
Start: 2022-11-09 — End: 2022-11-09
  Filled 2022-11-09: qty 2

## 2022-11-09 MED ORDER — KETOROLAC 30 MG/ML (1 ML) INJECTION SOLUTION
15.0000 mg | Freq: Four times a day (QID) | INTRAMUSCULAR | Status: DC
Start: 2022-11-09 — End: 2022-11-10
  Administered 2022-11-09 – 2022-11-10 (×3): 15 mg via INTRAVENOUS
  Filled 2022-11-09 (×3): qty 1

## 2022-11-09 MED ORDER — METOPROLOL TARTRATE 25 MG TABLET
25.0000 mg | ORAL_TABLET | Freq: Two times a day (BID) | ORAL | Status: DC
Start: 2022-11-09 — End: 2022-11-10
  Administered 2022-11-09 – 2022-11-10 (×2): 25 mg via ORAL
  Filled 2022-11-09 (×2): qty 1

## 2022-11-09 MED ORDER — RACEPINEPHRINE 2.25 % SOLUTION FOR NEBULIZATION
0.5000 mL | INHALATION_SOLUTION | Freq: Once | RESPIRATORY_TRACT | Status: DC | PRN
Start: 2022-11-09 — End: 2022-11-09

## 2022-11-09 MED ORDER — DEXAMETHASONE SODIUM PHOSPHATE 4 MG/ML INJECTION SOLUTION
10.0000 mg | Freq: Once | INTRAMUSCULAR | Status: DC
Start: 2022-11-09 — End: 2022-11-09

## 2022-11-09 MED ORDER — ROPIVACAINE (PF) 5 MG/ML (0.5 %) INJECTION SOLUTION
Freq: Once | INTRAMUSCULAR | Status: AC | PRN
Start: 2022-11-09 — End: 2022-11-09
  Administered 2022-11-09: 30 mL

## 2022-11-09 MED ORDER — METOCLOPRAMIDE 5 MG/ML INJECTION SOLUTION
10.0000 mg | Freq: Once | INTRAMUSCULAR | Status: DC | PRN
Start: 2022-11-09 — End: 2022-11-09

## 2022-11-09 MED ORDER — FENTANYL (PF) 50 MCG/ML INJECTION SOLUTION
Freq: Once | INTRAMUSCULAR | Status: DC | PRN
Start: 2022-11-09 — End: 2022-11-09
  Administered 2022-11-09: 100 ug via INTRAVENOUS

## 2022-11-09 SURGICAL SUPPLY — 66 items
AIRWAY ESOPH 5 CURVE 22.5MM 10.6MM ARNC LRYNG MASK CUF UNQ CLR CD PCH LF  PVC 4ML STRL DISP (RESPIRATORY/AIRWAY MGMT) ×1 IMPLANT
APPL 70% ISPRP 2% CHG 26ML CHLRPRP HI-LT ORNG PREP STRL LF  DISP CLR (MED SURG SUPPLIES) ×3 IMPLANT
BANDAGE COFLX 5YDX4IN STRL CHSV SLF ADH FOAM COMPRESS TAN LF (WOUND CARE SUPPLY) ×1 IMPLANT
BANDAGE MATRIX 10YDX6IN STRL ELAS HKLP CLSR PLSTR COTTON MED COMPRESS BGE WHT LF (WOUND CARE SUPPLY) ×1 IMPLANT
BLADE SAW 3.543X.709IN 2 CUT SGTL THK.054IN STRL (SURGICAL CUTTING SUPPLIES) ×1 IMPLANT
BLADE SAW 70X12.7MM SGTL SS THK.6MM LONG NRW THN STRL LF (SURGICAL CUTTING SUPPLIES) ×1 IMPLANT
BLADE SAW 77.6X11.2MM RECIPROCATE OFST HVDTY SS THK.77MM LONG STRL LF  DISP (SURGICAL CUTTING SUPPLIES) ×1 IMPLANT
BLANKET MISTRAL-AIR ADULT UPR BODY 79X29.9IN FRC AIR HI VOL BLWR INTUITIVE CONTROL PNL LRG LED (MED SURG SUPPLIES) ×1 IMPLANT
CANNULA NASAL 7FT ANGL FLXB LIP PLATE CRSH RS LUM TUBE FLR TIP ADULT ARLF UCIT STD CURVE LF  DISP (RESPIRATORY/AIRWAY MGMT) ×1 IMPLANT
CEMENT BONE SIMPLEX HV 20ML 40GM STRL ×2 IMPLANT
CIRCUIT BREATHING 2 LIMB 2 FILTER BRTH MASK GAS SAMPLE LINE 33-87IN ADULT 3L LF  PORTEX DISP STR WYE (RESPIRATORY/AIRWAY MGMT) ×1 IMPLANT
CONV USE 405187 - CUFF TOURNIQUET BLU 30X4IN ATS 3K CYL 2 PORT BLADDER POS LOCK CONN SLEEVE STRL LF  DISP (MED SURG SUPPLIES) ×1 IMPLANT
CONV USE 405188 - CUFF TOURNIQUET BRN 34X4IN ATS 3K CYL 2 PORT 1 BLADDER POS LOCK CONN SLEEVE STRL LF  DISP (MED SURG SUPPLIES) IMPLANT
CUFF TOURNIQUET BLU 30X4IN ATS 3K CYL 2 PORT BLADDER POS LOCK CONN SLEEVE STRL LF  DISP (MED SURG SUPPLIES) ×1
CUFF TOURNIQUET BRN 34X4IN ATS 3K CYL 2 PORT 1 BLADDER POS LOCK CONN SLEEVE STRL LF  DISP (MED SURG SUPPLIES)
DRAPE ABS REINF ADH HKLP LINE HLDR 102X53IN PRXM LF  STRL DISP SURG SMS 29X10IN (DRAPE/PACKS/SHEETS/OR TOWEL) ×1 IMPLANT
DRAPE ADH 51X47IN STRDRP LF  STRL DISP SURG CLR (DRAPE/PACKS/SHEETS/OR TOWEL) ×1 IMPLANT
DRESS ALG 10X3.5IN RECT PRIMASEAL IONIC SILVER FBR HYDROCOLLOID ADH ANTIMIC WTPRF OUTR LYR PAD LF (WOUND CARE SUPPLY) ×1 IMPLANT
ELECTRODE PATIENT RTN 9FT VLAB C30- LB RM PHSV ACRL FOAM CORD NONIRRITATE NONSENSITIZE ADH STRP (SURGICAL CUTTING SUPPLIES) ×1 IMPLANT
FEM 7 STD COCR CEMENT CNSTRN PERSONA KNEE CONDYLAR COM STRL LF (IMPLANTS KNEE) ×1 IMPLANT
GLOVE SURG 6 LF  PF BEAD CUF STRL CRM 11.5MM PROTEXIS PLISPRN THK11.2 MIL (GLOVES AND ACCESSORIES) ×2 IMPLANT
GLOVE SURG 6.5 LF  PF BEAD CUF STRL CRM 11.5MM PROTEXIS PLISPRN THK11.2 MIL (GLOVES AND ACCESSORIES) ×2 IMPLANT
GLOVE SURG 6.5 LF  PF SMOOTH BEAD CUF INTLK STRL BLU 11.3IN PROTEXIS NEU-THERA PLISPRN THK7.9 MIL (GLOVES AND ACCESSORIES) ×3 IMPLANT
GLOVE SURG 7 LF  PF SMOOTH BEAD CUF INTLK STRL BLU 11.8IN PROTEXIS NEU-THERA PLISPRN THK7.9 MIL (GLOVES AND ACCESSORIES) ×1 IMPLANT
GLOVE SURG 8 LF  PF BEAD CUF STRL CRM 12IN PROTEXIS PLISPRN THK11.2 MIL (GLOVES AND ACCESSORIES) ×2 IMPLANT
GLOVE SURG 8 LF  PF SMOOTH BEAD CUF INTLK STRL BLU 11.8IN PROTEXIS NEU-THERA PLISPRN THK7.9 MIL (GLOVES AND ACCESSORIES) ×1 IMPLANT
GLOVE SURG 9 LF  PF BEAD CUF STRL CRM 12IN PROTEXIS PLISPRN THK11.2 MIL (GLOVES AND ACCESSORIES) ×2 IMPLANT
GLOVE SURG 9 LF  PF SMOOTH BEAD CUF INTLK STRL BLU 11.8IN PROTEXIS NEU-THERA PLISPRN THK7.9 MIL (GLOVES AND ACCESSORIES) ×1 IMPLANT
GOWN SURG 2XL L4 IMPRV REINF BRTHBL STRL LF  DISP BLU AURR PE 49IN (DRAPE/PACKS/SHEETS/OR TOWEL) ×1 IMPLANT
GOWN SURG 3XL L4 IMPRV REINF BRTHBL STRL LF  DISP BLU AURR PE 49IN (DRAPE/PACKS/SHEETS/OR TOWEL) IMPLANT
HOOD SRG T7PLUS PLWY FACE SHIELD STRL LF  DISP (GLOVES AND ACCESSORIES) ×4 IMPLANT
KIT BONE CEM HWMDC MX-KIT BOWL SPAT STRL LF  DISP (ORTHOPEDICS (NOT IMPLANTS)) IMPLANT
KIT PLS LAV PLSVC + COM STRL LF (WOUND CARE SUPPLY) ×1 IMPLANT
NEEDLE FILTER 1.5IN 18GA BLUNT BVL LL HUB DEHP-FR BD STRL LF  PURP PLYCRB 5UM REG WL DISP (MED SURG SUPPLIES) ×1 IMPLANT
NEEDLE HYPO  18GA 1.5IN TW ECLIPSE POLYPROP REG BVL LL PVT SHIELD MECH DEHP-FR PNK STRL LF  DISP (MED SURG SUPPLIES) IMPLANT
NEEDLE HYPO  21GA 1.5IN TW ECLIPSE POLYPROP REG BVL LL PVT SHIELD MECH DEHP-FR GRN STRL LF  DISP (MED SURG SUPPLIES) ×2 IMPLANT
NEEDLE NERVE STIM 4IN 20GA STPLX ULTRA 360 30D BVL INSL ECHGN EXT SET STRL LF (MED SURG SUPPLIES) ×1 IMPLANT
PACK SURG T/K STRL DISP LF (CUSTOM TRAYS & PACK) ×1 IMPLANT
PADDING CAST 4YDX6IN SPCLST RYN COTTON MICROPLEAT HI ABS NONST LF (ORTHOPEDICS (NOT IMPLANTS)) IMPLANT
PADDING CAST 4YDX6IN SYN STRL LF (ORTHOPEDICS (NOT IMPLANTS)) ×1 IMPLANT
SENSOR NEUROLOGICAL BIS QUATRO (MED SURG SUPPLIES) ×1 IMPLANT
SOL IRRG 0.9% NACL 3L ARTHMTC LF (MEDICATIONS/SOLUTIONS) ×1 IMPLANT
SOL IV VIAFLEX LR 1000ML PLASTIC CONTAINR LF (MEDICATIONS/SOLUTIONS) ×1 IMPLANT
SOL WOUND CLNSG BACTISURE (WOUND CARE SUPPLY) IMPLANT
SPONGE GAUZE 4X4IN PLSTR RYN 4 PLY HYPOALL NWVN LF  STRL DISP (WOUND CARE SUPPLY) ×1 IMPLANT
STEM TIB PERSONA 135+ MM SPLINE REV KNEE STR 14MM STRL LF (IMPLANTS KNEE) ×1 IMPLANT
STEM TIB PERSONA 135+ MM SPLINE REV KNEE STR 18MM STRL LF (IMPLANTS KNEE) ×1 IMPLANT
STETH ESOPH 18FR TEMP SENSOR REG TUBE THN DIST CUF POS LOCK CONN 400 SER STRL LF (RESPIRATORY/AIRWAY MGMT) ×1 IMPLANT
STRIP 4X.5IN STRSTRP PLSTR REINF SKNCLS WHT STRL LF (WOUND CARE SUPPLY) ×1 IMPLANT
SURFACE ARTIC 18MM PERSONA 6-9 G-H KNEE RGT VIVACIT-E POST STAB CNSTRN STRL LF (IMPLANTS KNEE) ×1 IMPLANT
SUTURE 1 OS-6 SPRL STRATAFIX PDS + 12IN VIOL 1 STRN ABS (SUTURE/WOUND CLOSURE) IMPLANT
SUTURE 2 V-37 ETHIBOND EXC 30IN GRN BRD 4 STRN NONAB (SUTURE/WOUND CLOSURE) ×1 IMPLANT
SUTURE 2-0 CT2 VICRYL 27IN UNDYED BRD COAT ABS (SUTURE/WOUND CLOSURE) ×2 IMPLANT
SUTURE 3-0 PS2 SPRL STRATAFIX MONOCRYL + 12IN UNDYED ANBCTRL UNDIR ABS (SUTURE/WOUND CLOSURE) IMPLANT
SUTURE 3-0 PS2 SPRL STRATAFIX MONOCRYL + 60CM UNDYED KNOTLESS TISS CONTROL ANBCTRL ABS (SUTURE/WOUND CLOSURE) ×1 IMPLANT
SUTURE PDO 2 .5 CRC QUILL 36CM VIOL 2 ARM MONOF BARB TAPER PNT ABS 48MM (SUTURE/WOUND CLOSURE) ×1 IMPLANT
SYRINGE 60ML LF  STRL LID SFT BULB TIP IRRG TVK (MED SURG SUPPLIES) ×1 IMPLANT
SYRINGE LL 10ML LF  STRL GRAD N-PYRG DEHP-FR PVC FREE MED DISP (MED SURG SUPPLIES) IMPLANT
SYRINGE LL 20ML STRL GRAD MED DISP (MED SURG SUPPLIES) ×1 IMPLANT
SYRINGE LL 3ML LF  STRL GRAD N-PYRG DEHP-FR PVC FREE MED DISP CLR (MED SURG SUPPLIES) ×1 IMPLANT
TIB G NPOR REV FIX PERSONA KNEE RGT COM STRL LF (IMPLANTS KNEE) ×1 IMPLANT
TIP PLS LAV 12.7CM ML ORFC FAN SPRAY SPLSHLD HI CPC INTRAMED NAIL STRL (WOUND CARE SUPPLY) ×1 IMPLANT
TIP SUCT YANKAUER STD 5IN 1 CONN RIGID TRNSPR SLIP RST HNDL CLR STRL LF (MED SURG SUPPLIES) ×1 IMPLANT
WATER STRL 1000ML PLASTIC PR BTL LF (MED SURG SUPPLIES) IMPLANT
WAX BONE 2.5G STRL NATURAL (WOUND CARE SUPPLY) ×1 IMPLANT
WOUND IRRG IRRISEPT DBRD CLNSG 0.05% CHG SYSTEM STRL LF (WOUND CARE SUPPLY) ×1 IMPLANT

## 2022-11-09 NOTE — H&P (Signed)
[facility]  H&P Update Form    Izhar, Tesfaye, 77 y.o. male  Date of Admission:  (Not on file)  Date of Birth:  07/01/1945    11/09/2022    STOP: IF H&P IS GREATER THAN 30 DAYS FROM SURGICAL DAY COMPLETE NEW H&P IS REQUIRED.     H & P updated the day of the procedure.  1.  Full H&P has been completed within 30 days of surgical procedure and is scanned into the media section of chart review. I have reviewed the H&P within 24 hours of the surgery, the patient has been examined, and no change has occured in the patients condition since the H&P was completed.      Changes in medication:  Anticoagulants - held             Comments:        2.  Patient continues to be appropiate candidate for planned surgical procedure. YES    Antoine Primas, MD

## 2022-11-09 NOTE — OR Surgeon (Signed)
Revision RightTotal Knee Arthroplasty.  Operative Note    Indications:  77 year old male status post right total knee arthroplasty with instability and pain.     Pre-operative Diagnosis:  Instability status post right total knee arthroplasty.    Post-operative Diagnosis: Same.    Procedure:  Revision Right total knee arthroplasty, femoral and tibial components    Surgeon:  Antoine Primas, MD  Assistant: Cristy Folks, PA-C    Procedure Details:   The patient was seen in the pre-op holding area. The risks, benefits, complications, treatment options, and expected outcomes were discussed with the patient. The risks and potential complications of their problem and purposed treatment include but are not limited to infection, bleeding, pain, stiffness, nerve and vessel injury and complication secondary to the anesthetic. The patient concurred with the proposed plan, giving informed consent.  The site of surgery properly noted/marked. The patient was taken to the Operating Room, identified as Gary White and the procedure verified as revision rightTotal Knee Arthroplasty. A Time Out was held and the above information confirmed.  An orthopedic PA, Cristy Folks, PA-C was present for the procedure and assisted with knee exposure, bone cuts, ligament balancing, cementation and closure of the knee.    The patient was brought to the operating room, placed on the operating table in a supine position.  The patient had a pre operative, ultrasound guided, adductor canal block.  Following the successful induction of anesthesia the right lower extremity was prepped and draped in the usual sterile fashion, including a second prep after draping. The right lower extremity was then elevated, wrapped with a sterile Esmarch, the knee was then flexed and the tourniquet was inflated to 300 mmHg.  We utilized his previous longitudinal anteromedial incision. Routine medial parapatellar approach was performed.      We began by  trialing thicker polyethylene which was unable to alleviate his instability.  At this point we elected to proceed with full revision initially removing the femoral component with an osteotome and the tibial component was removed with a sagittal saw.  There was very little bone loss and after removal of these implants and retained cement we irrigated.    Using routine intramedullary femoral/tibial guide systems, routine cuts and alignment decisions were made for a # 7 right femur with a 18 x 135 mm splined stem extension.  A size  G  tibial bone plate with a 14 x 135 mm stem extension.  We trialed with an 18 mm CPS fixed bearing articular surface.  The knee was stable to varus valgus stress.  This allowed normal contact points in the polyethylene in both flexion and extension.  Patellofemoral tracking was normal. No lateral impingement was present.     Having completed the trial reduction, all trial components were removed. An intraarticular knee block was performed. With routine cement technique the tibial component and femoral components were cemented in place and excess cement removed. The knee was taken through a full range of motion with excellent patellar tracking..    At the completion of the procedure, the knee was thoroughly cleaned with pulse lavage. One gram of Vancomycin powder was placed in the knee. The extensor mechanism was closed with #2 Ethibond, #2 Stratafix, subcutaneous with 2-0 and skin was closed with 3-0 Stratafix. A sterile lightly compressive dressing was applied.    Instrument, sponge, and needle counts were correct prior to wound closure and at the conclusion of the case.     Findings:  Hyperextension deformity  with varus valgus instability    Estimated Blood Loss:  less than 100 ml              Total IV Fluids: crystalloid                  Implants:  Persona revision tibia right size G with a 14 x 135 mm stem extension.  Persona revision femur right size 7 with an 18 x 135 mm stem  extension.  Persona vitamin-E impregnated highly crosslink poly 18 mm height CPS fixed bearing           Complications:  None, patient tolerated the procedure well           Condition: Stable    Attending Attestation: I performed the procedure

## 2022-11-09 NOTE — Consults (Signed)
[facility]  Medical Consult      Date of Service:  11/09/2022  Gary White, Gary White, 77 y.o. 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     HPI: Gary White is a 77 y.o., White White who follows with Wilmon Pali, MD for primary care.    Patient is treated for   Active Hospital Problems    Diagnosis    Primary Problem: S/P revision of total knee, right    Painful orthopaedic hardware (CMS HCC)    History of deep vein thrombosis    H/O degenerative disc disease    Essential hypertension   .  Patient presents post operative.    Patient had Revision Right total knee arthroplasty, femoral and tibial components  with Antoine Primas, MD  on 11/09/2022.    Patient is from PACU to the floor in stable condition.   I will follow medically the inpatient post operative course.       PAST MEDICAL:    Past Medical History:   Diagnosis Date    Chronic pain     "lower back, right knee and arthritis"    DVT (deep venous thrombosis) (CMS HCC)     "left groin"    Essential hypertension     Neuropathy (CMS HCC)     Spinal cord compression (CMS HCC)     Wears glasses     readers        Past Surgical History:   Procedure Laterality Date    HX CERVICAL LAMINECTOMY  03/2021    C7-T1    HX CERVICAL SPINE SURGERY      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    LAMINECTOMY  03/2016    L4-5    REPLACEMENT TOTAL KNEE Right 03/2016            Medications Prior to Admission       Prescriptions    finasteride (PROSCAR) 5 mg Oral Tablet    Take 1 Tablet (5 mg total) by mouth Once a day    gabapentin (NEURONTIN) 300 mg Oral Capsule    Take 1 Capsule (300 mg total) by mouth Four times a day    lisinopriL (PRINIVIL) 40 mg Oral Tablet    Take 1 Tablet (40 mg total) by mouth Once a day for  180 days    metoprolol tartrate (LOPRESSOR) 25 mg Oral Tablet    Take 1 Tablet (25 mg total) by mouth Twice daily for 180 days    multivitamin with iron Oral Tablet    Take 1 Tablet by mouth Once a day    triamcinolone acetonide (ARISTOCORT A) 0.1 % Ointment    Apply topically Twice daily          No Known Allergies    Vaccinations:    Per primary care team    Family History:  Family Medical History:       Problem Relation (Age of Onset)    Cancer Mother    Heart Disease Father    Macular Degen Mother            Social History:  Social History     Socioeconomic History    Marital status: Divorced  Spouse name: Not on file    Number of children: Not on file    Years of education: Not on file    Highest education level: Not on file   Occupational History    Not on file   Tobacco Use    Smoking status: Former     Average packs/day: 1 pack/day for 20.0 years (20.0 ttl pk-yrs)     Types: Cigarettes     Start date: 1966    Smokeless tobacco: Never   Vaping Use    Vaping status: Never Used   Substance and Sexual Activity    Alcohol use: Not Currently    Drug use: Not Currently     Types: Marijuana     Comment: medical marijuana card    Sexual activity: Not on file   Other Topics Concern    Ability to Walk 1 Flight of Steps without SOB/CP No    Routine Exercise Not Asked    Ability to Walk 2 Flight of Steps without SOB/CP Not Asked    Unable to Ambulate Not Asked    Total Care Not Asked    Ability To Do Own ADL's Yes    Uses Walker Not Asked    Other Activity Level Not Asked    Uses Cane Not Asked   Social History Narrative    Not on file     Social Determinants of Health     Financial Resource Strain: Low Risk  (05/23/2018)    Received from [facility]  (prior to 12/11/2021)    Overall Financial Resource Strain (CARDIA)     Difficulty of Paying Living Expenses: Not hard at all   Transportation Needs: No Transportation Needs (05/23/2018)    Received from [facility]  (prior to 12/11/2021)     PRAPARE - Therapist, art (Medical): No     Lack of Transportation (Non-Medical): No   Social Connections: Not on file   Intimate Partner Violence: Not on file   Housing Stability: Not on file       ROS:   Constitutional: negative  Eyes: negative  Ears, nose, mouth, throat, and face: negative  Respiratory: negative  Cardiovascular: negative  Gastrointestinal: negative  Genitourinary:negative  Integument/breast: negative  Hematologic/lymphatic: negative  Musculoskeletal:positive for arthralgias  Neurological: negative  Behavioral/Psych: negative  Endocrine: negative  Allergic/Immunologic: negative    Examination:  Temperature: 36.4 C (97.5 F) Heart Rate: 65 BP (Non-Invasive): 131/72   Respiratory Rate: 18 SpO2: 100 %     Awake, alert, appropriate, no acute distress  Makes eye contact, engages in conversation and is appropriate  Moist mucous membranes  Skin is warm and dry, no rash noted  Neck is grossly normal  Heart is regular  Lungs are clear  Respirations are comfortable  Abdomen is soft with bowel sounds present, no mass palpated  No adenopathy palpated  Extremities are without cyanosis or clubbing.    No signs of acute neurovascular compromise.   Ambulatory with assistance  Neurologic exam is non acute      Labs:    Results for orders placed or performed during the hospital encounter of 11/09/22 (from the past 24 hour(s))   TYPE AND SCREEN   Result Value Ref Range    UNITS ORDERED NOT STATED     ABO/RH(D) B POSITIVE     ANTIBODY SCREEN NEGATIVE     SPECIMEN EXPIRATION DATE 11/12/2022,2359  Imaging Studies:    Results for orders placed or performed during the hospital encounter of 11/09/22 (from the past 72 hour(s))   XR KNEE RIGHT 2 VIEW     Status: None    Narrative    History: Knee pain.    2 views of the knee are obtained. There are postoperative changes of a revised total knee arthroplasty. There are longstem femoral and tibial components which are cemented. Alignment  appears anatomic. There is soft tissue swelling and soft tissue air related to the surgery. There is a suspected small joint effusion. There are severe arterial vascular calcifications      Impression    1. 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      Radiologist location ID: [id number]         DNR Status:  Full Code    Assessment/Plan:   Active Hospital Problems    Diagnosis    Primary Problem: S/P revision of total knee, right    Painful orthopaedic hardware (CMS HCC)    History of deep vein thrombosis    H/O degenerative disc disease    Essential hypertension       Medical management as ordered.      DVT/PE Prophylaxis: per the orthopedic team     Level of Service: High    Lavell Anchors, DO

## 2022-11-09 NOTE — OR PostOp (Signed)
1451  Asleep on arrival to PACU from OR.  Skin warm,dry.  Color pink.   Resp deep,nonlabored.  Lips and nailbeds pink.   HOB up.  IV infusing.   Ace wrap drsg dry,intact to right knee. Ice man to right knee.  Right toes warm,pink with rapid capillary refill.   Right pedal pulses palpable.Marland Kitchen SCDs to feet.  Siderails up.  1506  Xray taken right knee.  1600  Awake and oriented.  Skin warm,dry.  Color pink.   Resp deep,nonlabored.  Lips and nailbeds pink.   HOB up.  IV infusing.   Ace wrap drsg dry,intact to right knee.  Ice man to right knee.  Right toes cool and pink.  Can wiggle right toes x 5 easily but states he has neuropathy, non-diabetic, and toes are numb.  Right pedal pulses palpable.  Pedal SCDs to feet.  Moving extremities x 4.  Siderails up.  1615  Transported to 404

## 2022-11-09 NOTE — Telephone Encounter (Signed)
Rx routed for approval.     Last scheduled appointment with you was 09/01/2022.    Currently scheduled future appointment is 03/08/2023.    [address] [place], [place]  11/09/2022, 09:13

## 2022-11-09 NOTE — Anesthesia Postprocedure Evaluation (Signed)
Anesthesia Post Op Evaluation    Patient: Gary White  Procedure(s):  REVISION RIGHT TOTAL KNEE ARTHROPLASTY    Last Vitals:Temperature: 36.7 C (98.1 F) (11/09/22 2034)  Heart Rate: 83 (11/09/22 2034)  BP (Non-Invasive): 103/60 (11/09/22 2034)  Respiratory Rate: 16 (11/09/22 2034)  SpO2: 96 % (11/09/22 2034)    No notable events documented.      Patient location during evaluation: PACU       Patient participation: complete - patient participated  Level of consciousness: awake and alert and responsive to verbal stimuli    Pain management: adequate  Airway patency: patent    Anesthetic complications: no  Cardiovascular status: acceptable  Respiratory status: acceptable  Hydration status: acceptable  Patient post-procedure temperature: Pt Normothermic   PONV Status: Absent

## 2022-11-09 NOTE — Anesthesia Procedure Notes (Signed)
Gary White    Block: Peripheral Block    Performed By:   Authorizing Provider:  Vickki Muff, MD  Performing Provider:  Vickki Muff, MD     Sedation        Blocks  Right Adductor femoral  Type of Block: single shot    Ultrasound was used to identify the nerve structure(s) along with needle placement during the procedure  Image:  Not saved  Diagnosis: knee pain   Indication: Requested by surgeon and Acute post operative pain   Pt location: at Bedside  Requesting Surgeon: Antoine Primas, MD  Site verified, H&P updated and consent obtained, Patient monitors applied, Timeout performed, Emergency drugs and equipment available, Patient positioned and anesthesia consent given  Technique(See MAR for doses)  Approach:  Peripheral Block   Preprocedure hand washing was performed sterile field maintained      Sterile Skin Prep : Sterile gloves, cap, Mask, Supplies assembled on sterile field and Sterile field established    Skin prepped with: Chlorhexidine gluconate and isopropyl alcohol    Skin Local      Needle  Needle type: Stimuplex     Needle Gauge: 20G   Needle length: 4 in.  Needle localization: ultrasound guidance  Number of attempts: 1      Catheter          Site      Medications  Medications:  ROPivacaine PF (NAROPIN) 0.5% injection - Local Infiltration   30 mL - 11/09/2022 12:35:00 PM  Assessment  Injection assessment: incremental injection, negative aspiration for heme and local visualized surrounding nerve on ultrasound  Paresthesia pain: none    Heart Rate changed: No    Events     Patient tolerance of procedure: tolerated well, no immediate complications

## 2022-11-09 NOTE — Care Plan (Signed)
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 Patient now with impaired ability to safely and independently complete ADLs.      Discharge Needs:   Equipment Recommendation: (P) none anticipated  Discharge Disposition: (P) home with assist, home with outpatient services    JUSTIFICATION OF DISCHARGE RECOMMENDATION   Based on current diagnosis, functional performance prior to admission, and current functional performance, this patient requires continued OT services in (P) home with assist, home with outpatient services  in order to achieve significant functional improvements.    Plan:   Current Intervention: (P) ADL retraining, balance training, endurance training, bed mobility training, transfer training    To provide Occupational therapy services (P) minimum of 1x/week, (P) until discharge.       The risks/benefits of therapy have been discussed with the patient/caregiver and he/she is in agreement with the established plan of care.       Subjective & Objective        11/09/22 1632   Therapist Pager   OT Assigned/ Pager # *SB [phone]/15   Rehab Session   Document Type evaluation   Total OT Minutes: 15   Patient Effort good   General Information   Patient Profile Reviewed yes   Patient/Family/Caregiver Comments/Observations Patient cleared for therapy, seen with PT. Patient resting in bed upon entering room with son at bedside.   Pertinent History of Current Functional Problem POD 0 revision R TKA   Medical Lines PIV Line   Respiratory Status room air   Existing Precautions/Restrictions fall precautions   Mutuality/Individual Preferences    Individualized Care Needs goes by [employer]   Lives With alone   Living Arrangements house   Transportation Available family or friend will provide   Living Environment Comment Patient lives in a single story home with 2 STE and grab bar at steps. Patient has a tub/shower combo with chair and grab bars. Son will be staying with patient as he recovers.   Functional Level Prior   Ambulation 1 - assistive equipment   Transferring 1 - assistive equipment   Toileting 0 - independent   Bathing 1 - assistive equipment   Dressing 0 - independent   Eating 0 - independent   Prior Functional Level Comment Patient is ambulatory with rollator. Patient has been wearing a knee brace and AFO RLE. Patient with drop foot from spinal surgery ~ 5 years ago.   Pain Assessment   Additional Documentation Pain Scale: Word Pre/Post-Treatment (Group)   Pain Scale: Word Pre/Post-Treatment   Pre/Posttreatment Pain Comment 5/10 pain R knee, ice man in place pre/post session   Coping/Psychosocial Response Interventions   Plan Of Care Reviewed With patient;son   Cognitive Assessment/Interventions   Behavior/Mood Observations behavior appropriate to situation, WNL/WFL   Orientation Status oriented x 3   Attention WNL/WFL   Follows Commands WNL   RUE Assessment   RUE Assessment WFL- Within Functional Limits   LUE Assessment   LUE Assessment WFL- Within Functional Limits   Musculoskeletal   RLE  Weight-Bearing Status weight-bearing as tolerated   Bed Mobility Assessment/Treatment   Supine-Sit Independence minimum assist (75% patient effort)   Safety Issues decreased use of legs for bridging/pushing   Comment assist with RLE management   Transfer Assessment/Treatment   Sit-Stand Independence minimum assist (75% patient effort)   Stand-Sit Independence minimum assist (75% patient effort)   Sit-Stand-Sit, Assist Device walker, four wheeled   Bed-Chair Independence minimum assist (75% patient effort)   Bed-Chair-Bed Assist Device  walker, four wheeled   Transfer Comment Increased time to complete transfer   Bathing Assessment/Training   Independence Level moderate assist (50% patient effort)   Upper Body Dressing Assessment/Training   Independence Level  set up required   Lower Body Dressing Assessment/Training   Independence Level  moderate assist (50% patient effort)   Impairments activity tolerance impaired;balance impaired;pain;ROM decreased   Toileting Assessment/Training   Independence Level  minimum assist (75% patient effort)   Comment assist with clothing management   Grooming/Oral Hygeine  Assessment/Training   Independence Level set up required   Self-Feeding Assessment/Training   Independence Level set up required   Post Treatment Status   Post Treatment Patient Status Patient sitting in bedside chair or w/c;Call light within reach;Telephone within reach;Patient safety alarm activated   Support Present Post Treatment  Family present   Communication Post Treatement Nurse   Care Plan Goals   OT Rehab Goals Bed Mobility Goal;Transfer Training Goal;LB Dressing Goal   Bed Mobility Goal   Bed Mobility Goal, Date Established 11/09/22   Bed Mobility Goal, Time to Achieve by discharge   Bed Mobility Goal, Activity Type all bed mobility activities   Bed Mobility Goal, Independence Level stand-by assistance   LB Dressing Goal   LB Dressing Goal, Date Established 11/09/22   LB Dressing Goal, Time to Achieve by discharge   LB Dressing Goal, Activity Type all lower body dressing tasks   LB Dressing Goal, Independence Level stand-by assistance   Transfer Training Goal   Transfer Training Goal, Date Established 11/09/22   Transfer Training Goal, Time to Achieve by discharge   Transfer Training Goal, Activity Type all transfers   Transfer Training Goal, Independence Level stand-by assistance   Transfer Training Goal, Assist Device walker, rolling   Planned Therapy Interventions, OT Eval   Planned Therapy Interventions ADL retraining;balance  training;endurance training;bed mobility training;transfer training   Clinical Impression   Functional Level at Time of Session Patient is POD 0 revision of R TKA, 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 Patient now with impaired ability to safely and independently complete ADLs.   Criteria for Skilled Therapeutic Interventions Met (OT) yes   Rehab Potential good   Therapy Frequency minimum of 1x/week   Predicted Duration of Therapy until discharge   Anticipated Equipment Needs at Discharge none anticipated   Anticipated Discharge Disposition home with assist;home with outpatient services   Daily Activity AM-PAC/6-clicks Score   Putting on/Taking off clothing on lower body 2   Bathing 2   Toileting 3   Putting on/Taking off clothing on upper body 4   Personal grooming 4   Eating Meals 4   Raw Score Total 19   Standardized (t-scale) Score 40.22   CMS 0-100% Score 42.8   CMS Modifier CK       Therapist:   Bridgette Habermann, MOTR/L     Start Time: 1632  End Time: 1647  Evaluation Time: 15 minutes  Charges Entered: OTE x 1  Department Number: [id number]

## 2022-11-09 NOTE — OR Nursing (Signed)
Old total knee implants were discarded properly per patient wishes.    Hessie Diener, RN

## 2022-11-09 NOTE — Care Plan (Signed)
Pt is alert and oriented x4 on RA. Respiration even and unlabored. PT/OT is ordered. Pt reports pain 4/10 on the pain scale. Pt has Toradol prescribed Q6. Pt's son is at bedside, attentive to care. IV is clean, dry, and intact, flushed without any difficulty. Ice man and SCDs are in place. No other needs at this time. Call light within reach, and safety measures in place. Plan of care on going. Rickell Wiehe Bascom Levels, RN                Problem: Adult Inpatient Plan of Care  Goal: Plan of Care Review  Outcome: Ongoing (see interventions/notes)  Goal: Patient-Specific Goal (Individualized)  Outcome: Ongoing (see interventions/notes)  Flowsheets (Taken 11/09/2022 1710)  Patient would like to participate in bedside shift report: Yes  Individualized Care Needs: Pain management  Anxieties, Fears or Concerns: None voiced  Patient-Specific Goals (Include Timeframe): Get better and go home  Plan of Care Reviewed With:   patient   son  Goal: Absence of Hospital-Acquired Illness or Injury  Outcome: Ongoing (see interventions/notes)  Intervention: Identify and Manage Fall Risk  Recent Flowsheet Documentation  Taken 11/09/2022 1710 by Delane Ginger, RN  Safety Promotion/Fall Prevention:   activity supervised   safety round/check completed   nonskid shoes/slippers when out of bed   fall prevention program maintained  Intervention: Prevent Skin Injury  Recent Flowsheet Documentation  Taken 11/09/2022 1710 by Delane Ginger, RN  Body Position: sitting  Skin Protection:   adhesive use limited   tubing/devices free from skin contact   transparent dressing maintained  Intervention: Prevent and Manage VTE (Venous Thromboembolism) Risk  Recent Flowsheet Documentation  Taken 11/09/2022 1710 by Delane Ginger, RN  VTE Prevention/Management:   foot pump device on   sequential compression devices on   dorsiflexion/plantar flexion performed  Intervention: Prevent Infection  Recent Flowsheet Documentation  Taken 11/09/2022 1710 by  Delane Ginger, RN  Infection Prevention:   barrier precautions utilized   rest/sleep promoted   promote handwashing   environmental surveillance performed  Goal: Optimal Comfort and Wellbeing  Outcome: Ongoing (see interventions/notes)  Intervention: Provide Person-Centered Care  Recent Flowsheet Documentation  Taken 11/09/2022 1710 by Delane Ginger, RN  Trust Relationship/Rapport: care explained  Goal: Rounds/Family Conference  Outcome: Ongoing (see interventions/notes)     Problem: Pain Acute  Goal: Optimal Pain Control and Function  Outcome: Ongoing (see interventions/notes)  Intervention: Prevent or Manage Pain  Recent Flowsheet Documentation  Taken 11/09/2022 1710 by Delane Ginger, RN  Sleep/Rest Enhancement: awakenings minimized  Bowel Elimination Promotion: adequate fluid intake promoted  Intervention: Optimize Psychosocial Wellbeing  Recent Flowsheet Documentation  Taken 11/09/2022 1710 by Delane Ginger, RN  Spiritual Activities Assistance: affirmation provided  Diversional Activities:   television   smartphone  Supportive Measures: active listening utilized     Problem: Fall Injury Risk  Goal: Absence of Fall and Fall-Related Injury  Outcome: Ongoing (see interventions/notes)  Intervention: Identify and Manage Contributors  Recent Flowsheet Documentation  Taken 11/09/2022 1710 by Delane Ginger, RN  Self-Care Promotion: independence encouraged  Intervention: Promote Injury-Free Environment  Recent Flowsheet Documentation  Taken 11/09/2022 1710 by Delane Ginger, RN  Safety Promotion/Fall Prevention:   activity supervised   safety round/check completed   nonskid shoes/slippers when out of bed   fall prevention program maintained     Problem: Knee Arthroplasty  Goal: Optimal Coping  Outcome: Ongoing (see interventions/notes)  Intervention: Support Psychosocial Response to Surgery and  Mobility Changes  Recent Flowsheet Documentation  Taken 11/09/2022 1710 by Delane Ginger, RN  Supportive Measures: active listening utilized  Goal: Absence of Bleeding  Outcome: Ongoing (see interventions/notes)  Goal: Effective Bowel Elimination  Outcome: Ongoing (see interventions/notes)  Intervention: Enhance Bowel Motility and Elimination  Recent Flowsheet Documentation  Taken 11/09/2022 1710 by Delane Ginger, RN  Bowel Elimination Promotion: adequate fluid intake promoted  Goal: Fluid and Electrolyte Balance  Outcome: Ongoing (see interventions/notes)  Goal: Optimal Functional Ability  Outcome: Ongoing (see interventions/notes)  Intervention: Promote Optimal Functional Status  Recent Flowsheet Documentation  Taken 11/09/2022 1710 by Delane Ginger, RN  Self-Care Promotion: independence encouraged  Activity Management:   up in chair   ROM, active encouraged  Assistive Device Utilized: walker  Goal: Absence of Infection Signs and Symptoms  Outcome: Ongoing (see interventions/notes)  Intervention: Prevent or Manage Infection  Recent Flowsheet Documentation  Taken 11/09/2022 1710 by Delane Ginger, RN  Fever Reduction/Comfort Measures:   Building surveyor  Infection Prevention:   barrier precautions utilized   rest/sleep promoted   promote handwashing   environmental surveillance performed  Infection Management: aseptic technique maintained  Goal: Intact Neurovascular Status  Outcome: Ongoing (see interventions/notes)  Goal: Anesthesia/Sedation Recovery  Outcome: Ongoing (see interventions/notes)  Intervention: Optimize Anesthesia Recovery  Recent Flowsheet Documentation  Taken 11/09/2022 1710 by Delane Ginger, RN  Safety Promotion/Fall Prevention:   activity supervised   safety round/check completed   nonskid shoes/slippers when out of bed   fall prevention program maintained  Goal: Acceptable Pain Control  Outcome: Ongoing (see interventions/notes)  Intervention: Prevent or Manage Pain  Recent Flowsheet Documentation  Taken 11/09/2022 1710 by Delane Ginger, RN  Diversional Activities:   television   smartphone  Goal: Nausea and Vomiting Relief  Outcome: Ongoing (see interventions/notes)  Goal: Effective Urinary Elimination  Outcome: Ongoing (see interventions/notes)  Goal: Effective Oxygenation and Ventilation  Outcome: Ongoing (see interventions/notes)

## 2022-11-09 NOTE — Care Plan (Signed)
[facility]  Rehabilitation Services  Physical Therapy Initial Evaluation    Patient Name: Gary White  Date of Birth: Jul 30, 1945  Height: Height: 175.3 cm (5\' 9" )  Weight: Weight: 91.4 kg (201 lb 8 oz)  Room/Bed: 404/A  Payor: MEDICARE / Plan: MEDICARE PART A AND B / Product Type: Medicare /     Assessment:      77 year old male admitted s/p R TKA revision. Patient has history of R foot drop and wears AFO. Patient will benefit from skilled PT intervention to address functional deficits and return to PLOF.    Discharge Needs:    Equipment Recommendation: none anticipated      The patient presents with mobility limitations due to impaired balance, impaired strength, and impaired functional activity tolerance that significantly impair/prevent patient's ability to participate in mobility-related activities of daily living (MRADLs) including  ambulation and transfers in order to safely complete, toileting, bathing, food preparation, laundering/household tasks, safely entering/exiting the home. This functional mobility deficit can be sufficiently resolved with the use of a none anticipated  in order to decrease the risk of falls, morbidity, and mortality in performance of these MRADLs.  Patient is able to safely use this assistive device.    Discharge Disposition: home with assist    JUSTIFICATION OF DISCHARGE RECOMMENDATION   Based on current diagnosis, functional performance prior to admission, and current functional performance, this patient requires continued PT services in home with assist in order to achieve significant functional improvements in these deficit areas: aerobic capacity/endurance, gait, locomotion, and balance, muscle performance, neuromuscular, ROM (range of motion), ventilation and respiration/gas exchange.    Plan:   Current Intervention: balance training, bed mobility training, gait training, home exercise program, neuromuscular re-education, ROM (range of motion), stair  training, strengthening, transfer training  To provide physical therapy services minimum of 1x/week  for duration of until discharge.    The risks/benefits of therapy have been discussed with the patient/caregiver and he/she is in agreement with the established plan of care.       Subjective & Objective        11/09/22 1704   Therapist Pager   PT Assigned/ Pager # [phone]   Rehab Session   Document Type evaluation   Total PT Minutes: 17   Patient Effort good   Symptoms Noted During/After Treatment dizziness   General Information   Patient Profile Reviewed yes   Onset of Illness/Injury or Date of Surgery 11/09/22   Referring Physician Katherene Ponto   Pertinent History of Current Functional Problem Patient admitted s/p R TKA revision. Patient has history of foot drop on R and wears AFO.   Medical Lines PIV Line   Respiratory Status room air   Existing Precautions/Restrictions full code;fall precautions   Mutuality/Individual Preferences   Individualized Care Needs Goes by Ulice [employer]illiant of Care Reviewed With patient;son   Living Environment   Lives With alone   Living Arrangements house   Living Environment Comment Patient lives alone in a single story house with 2 STE. Son will stay with patient while he recovers.   Functional Level Prior   Ambulation 1 - assistive equipment   Transferring 1 - assistive equipment   Toileting 0 - independent   Bathing 0 - independent   Dressing 0 - independent   Eating 0 - independent   Prior Functional Level Comment Patient utilizes a rollator for ambulation.   Pre Treatment Status   Pre Treatment Patient Status Patient supine in  bed;Call light within reach;Telephone within reach   Support Present Pre Treatment  Family present   Communication Pre Treatment  Nurse   Communication Pre Treatment Comment RN cleared patient for PT eval.   Cognitive Assessment/Interventions   Behavior/Mood Observations behavior appropriate to situation, WNL/WFL   Pain Assessment   Pre/Posttreatment Pain Comment  5/10 R knee   RLE Assessment   RLE Assessment X-Exceptions   RLE ROM decreased knee flexion and extension   RLE Strength grossly impaired   LLE Assessment   LLE Assessment WFL for stated baseline   Bed Mobility Assessment/Treatment   Supine-Sit Independence minimum assist (75% patient effort)   Transfer Assessment/Treatment   Sit-Stand Independence minimum assist (75% patient effort)   Stand-Sit Independence minimum assist (75% patient effort)   Sit-Stand-Sit, Assist Device walker, four wheeled   Bed-Chair Independence minimum assist (75% patient effort)   Bed-Chair-Bed Assist Device walker, four wheeled   Transfer Comment Patient required min assist and increased time to complete bed to chair transfer due to decreased motor control of RLE.   Gait Assessment/Treatment   Independence  not tested   Balance Skill Training   Sitting Balance: Static good balance   Sitting, Dynamic (Balance) good balance   Sit-to-Stand Balance fair + balance   Standing Balance: Static fair - balance   Standing Balance: Dynamic fair - balance   Therapeutic Exercise/Activity   Comment Patient educated on PT role and POC.   Post Treatment Status   Post Treatment Patient Status Patient sitting in bedside chair or w/c;Call light within reach;Patient safety alarm activated;Telephone within reach   Support Present Post Treatment  Family present   Plan of Care Review   Plan Of Care Reviewed With patient;son   Basic Mobility Am-PAC/6Clicks Score (APPROVED Staff)   Turning in bed without bedrails 3   Lying on back to sitting on edge of flat bed 3   Moving to and from a bed to a chair 3   Standing up from chair 3   Walk in room 2   Climbing 3-5 steps with railing 2   6 Clicks Raw Score total 16   Standardized (t-scale) score 38.32   Functional Impairment   Overall Functional Impairments/Problem List balance impaired;coordination impaired;endurance;pain;ROM decreased;strength decreased   Physical Therapy Clinical Impression   Assessment 77 year old  male admitted s/p R TKA revision. Patient has history of R foot drop and wears AFO. Patient will benefit from skilled PT intervention to address functional deficits and return to PLOF.   Criteria for Skilled Therapeutic yes   Pathology/Pathophysiology Noted musculoskeletal;cardiovascular;pulmonary;neuromuscular   Impairments Found (describe specific impairments) aerobic capacity/endurance;gait, locomotion, and balance;muscle performance;neuromuscular;ROM (range of motion);ventilation and respiration/gas exchange   Functional Limitations in Following  self-care   Rehab Potential good   Therapy Frequency minimum of 1x/week   Predicted Duration of Therapy Intervention (days/wks) until discharge   Anticipated Equipment Needs at Discharge (PT) none anticipated   Anticipated Discharge Disposition home with assist   Evaluation Complexity Justification   Patient History: Co-morbidity/factors that impact Plan of Care 3 or more that impact Plan of Care   Examination Components 3 or more Exam elements addressed   Presentation Stable: Uncomplicated, straight-forward, problem focused   Clinical Decision Making Low complexity   Evaluation Complexity Low complexity   Care Plan Goals   PT Rehab Goals Physical Therapy Goal;Bed Mobility Goal;Gait Training Goal;Stairs Training Goal;Transfer Training Goal   Physical Therapy Goal   PT  Goal, Date Established 11/09/22   PT Goal,  Time to Achieve by discharge   PT Goal, Activity Type Patient will participate in BLE strengthening exercises to improve gait and transfer effiency.   Bed Mobility Goal   Bed Mobility Goal, Date Established 11/09/22   Bed Mobility Goal, Time to Achieve by discharge   Bed Mobility Goal, Activity Type all bed mobility activities   Bed Mobility Goal, Independence Level modified independence   Gait Training  Goal, Distance to Achieve   Gait Training  Goal, Date Established 11/09/22   Gait Training  Goal, Time to Achieve by discharge   Gait Training  Goal,  Independence Level stand-by assistance   Gait Training  Goal, Assist Device walker, rolling   Gait Training  Goal, Distance to Achieve 150 feet   Stairs Training Goal   Stairs Training Goal, Date Established 11/09/22   Stairs Training Goal, Time to Achieve by discharge   Stairs Training Goal, Independence Level contact guard assist   Stairs Training Goal, Assist Device least restrictive assistive device   Stairs Training Goal, Number of Stairs to Achieve 2   Transfer Training Goal   Transfer Training Goal, Date Established 11/09/22   Transfer Training Goal, Time to Achieve by discharge   Transfer Training Goal, Activity Type all transfers   Transfer Training Goal, Independence Level stand-by assistance   Transfer Training Goal, Assist Device walker, rolling   Planned Therapy Interventions, PT Eval   Planned Therapy Interventions (PT) balance training;bed mobility training;gait training;home exercise program;neuromuscular re-education;ROM (range of motion);stair training;strengthening;transfer training       Therapist:   Gennie Alma, PT, DPT [phone]     Start Time: 1647  End Time: 1704  Evaluation Time: 17 minutes  Charges Entered: PT eval   Department Number: [id number]

## 2022-11-09 NOTE — Anesthesia Transfer of Care (Signed)
ANESTHESIA TRANSFER OF CARE   Gary White is a 77 y.o. ,male, Weight: 91.4 kg (201 lb 8 oz)   had Procedure(s):  REVISION RIGHT TOTAL KNEE ARTHROPLASTY  performed  11/09/22   Primary Service: Antoine Primas, *    Past Medical History:   Diagnosis Date    Chronic pain     "lower back, right knee and arthritis"    DVT (deep venous thrombosis) (CMS HCC)     "left groin"    Essential hypertension     Neuropathy (CMS HCC)     Spinal cord compression (CMS HCC)     Wears glasses     readers      Allergy History as of 11/09/22        No Known Allergies                  I completed my transfer of care / handoff to the receiving personnel during which we discussed:  All key/critical aspects of case discussed and Gave opportunity for questions and acknowledgement of understanding  Report given to: Egbert Garibaldi, RN    Post Location: PACU                                                             Last OR Temp: Temperature: 37.3 C (99.1 F)  ABG:  POTASSIUM   Date Value Ref Range Status   10/26/2022 4.4 3.5 - 5.1 mmol/L Final     CALCIUM   Date Value Ref Range Status   10/26/2022 10.0 8.6 - 10.3 mg/dL Final     Comment:     Gadolinium-containing contrast can interfere with calcium measurement.       Calculated P Axis   Date Value Ref Range Status   10/26/2022 39 degrees Final     Calculated R Axis   Date Value Ref Range Status   10/26/2022 -24 degrees Final     Calculated T Axis   Date Value Ref Range Status   10/26/2022 58 degrees Final     Airway:* No LDAs found *  Blood pressure 115/63, pulse 92, temperature 37.3 C (99.1 F), resp. rate 12, height 1.753 m (5\' 9" ), weight 91.4 kg (201 lb 8 oz), SpO2 98%.

## 2022-11-10 ENCOUNTER — Emergency Department
Admission: EM | Admit: 2022-11-10 | Discharge: 2022-11-11 | Disposition: A | Discharge status: Home / Self Care | Payer: Medicare Other | Attending: Emergency Medicine | Admitting: Emergency Medicine

## 2022-11-10 ENCOUNTER — Other Ambulatory Visit: Payer: Self-pay

## 2022-11-10 DIAGNOSIS — T84022D Instability of internal right knee prosthesis, subsequent encounter: Secondary | ICD-10-CM

## 2022-11-10 DIAGNOSIS — Z96651 Presence of right artificial knee joint: Secondary | ICD-10-CM | POA: Insufficient documentation

## 2022-11-10 DIAGNOSIS — Z4800 Encounter for change or removal of nonsurgical wound dressing: Secondary | ICD-10-CM | POA: Insufficient documentation

## 2022-11-10 DIAGNOSIS — Z5189 Encounter for other specified aftercare: Secondary | ICD-10-CM

## 2022-11-10 LAB — CBC WITH DIFF
BASOPHIL #: <0.1 10*3/uL (ref <=0.20)
BASOPHIL %: 0 %
EOSINOPHIL #: 0.42 10*3/uL (ref <=0.50)
EOSINOPHIL %: 5 %
HCT: 34.7 % — ABNORMAL LOW (ref 38.9–52.0)
HGB: 11.9 g/dL — ABNORMAL LOW (ref 13.4–17.5)
IMMATURE GRANULOCYTE #: <0.1 10*3/uL (ref <0.10)
IMMATURE GRANULOCYTE %: 1 % (ref 0.0–1.0)
LYMPHOCYTE #: 1.19 10*3/uL (ref 1.00–4.80)
LYMPHOCYTE %: 13 %
MCH: 31.6 pg (ref 26.0–32.0)
MCHC: 34.3 g/dL (ref 31.0–35.5)
MCV: 92.3 fL (ref 78.0–100.0)
MONOCYTE #: 0.89 10*3/uL (ref 0.20–1.10)
MONOCYTE %: 10 %
MPV: 9.5 fL (ref 8.7–12.5)
NEUTROPHIL #: 6.3 10*3/uL (ref 1.50–7.70)
NEUTROPHIL %: 71 %
PLATELETS: 107 10*3/uL — ABNORMAL LOW (ref 150–400)
RBC: 3.76 10*6/uL — ABNORMAL LOW (ref 4.50–6.10)
RDW-CV: 14.5 % (ref 11.5–15.5)
WBC: 8.9 10*3/uL (ref 3.7–11.0)

## 2022-11-10 LAB — H & H
HCT: 35.3 % — ABNORMAL LOW (ref 38.9–52.0)
HGB: 12.3 g/dL — ABNORMAL LOW (ref 13.4–17.5)

## 2022-11-10 LAB — PT/INR
INR: 0.97 (ref <=5.00)
PROTHROMBIN TIME: 11.4 seconds (ref 9.7–13.6)

## 2022-11-10 MED ORDER — APIXABAN 2.5 MG TABLET
2.5000 mg | ORAL_TABLET | Freq: Two times a day (BID) | ORAL | Status: DC
Start: 2022-11-10 — End: 2022-11-10

## 2022-11-10 MED ORDER — SODIUM CHLORIDE 0.9 % (FLUSH) INJECTION SYRINGE
3.0000 mL | INJECTION | Freq: Three times a day (TID) | INTRAMUSCULAR | Status: DC
Start: 2022-11-10 — End: 2022-11-11

## 2022-11-10 MED ORDER — APIXABAN 2.5 MG TABLET
2.5000 mg | ORAL_TABLET | Freq: Two times a day (BID) | ORAL | 0 refills | Status: DC
Start: 2022-11-10 — End: 2023-01-05

## 2022-11-10 MED ORDER — SODIUM CHLORIDE 0.9 % (FLUSH) INJECTION SYRINGE
3.0000 mL | INJECTION | INTRAMUSCULAR | Status: DC | PRN
Start: 2022-11-10 — End: 2022-11-11

## 2022-11-10 MED ORDER — ASPIRIN 81 MG TABLET,DELAYED RELEASE
81.0000 mg | DELAYED_RELEASE_TABLET | Freq: Two times a day (BID) | ORAL | Status: DC
Start: 2022-11-10 — End: 2022-11-10

## 2022-11-10 MED ORDER — SODIUM CHLORIDE 0.9 % IV BOLUS
1000.0000 mL | INJECTION | Status: AC
Start: 2022-11-10 — End: 2022-11-11
  Administered 2022-11-10: 1000 mL via INTRAVENOUS
  Administered 2022-11-11: 0 mL via INTRAVENOUS

## 2022-11-10 NOTE — ED Provider Notes (Signed)
Emergency Department  Provider Note  HPI - 11/10/2022    Name: Gary White  Age and Gender: 77 y.o. male  Attending: Dr. Drema Halon    HPI:    Gary White is a 77 y.o. male  who arrives via EMS presents to the ED today for post-op bleeding. Patient had a total R knee done this morning by Dr. Katherene Ponto. Patient states he was D/C this morning after surgery. He has been walking around on his knee and states he felt something "gooey" and noticed blood coming from his surgical site. Patient replaced his bandages but his surgical site continues to bleed and he became concerned. Otherwise, he has no other complaints at this time.      History provided by: pt     Review of Systems:  Constitutional: No fever, chills  Skin: No rashes or lesions  HENT: No sore throat, ear pain, or difficulty swallowing  Eyes: No vision changes, redness, discharge  Cardio: No chest pain, palpitations   Respiratory: No cough, wheezing or SOB  GI:  No nausea or vomiting. No diarrhea or constipation. No abdominal pain  GU:  No dysuria, hematuria, polyuria  MSK: R knee post-op.  No neck or back pain  Neuro: No numbness, tingling, or weakness.  No headache  All other systems reviewed and are negative, unless commented on in the HPI.       Below information reviewed with patient:   Current Outpatient Medications   Medication Sig    apixaban (ELIQUIS) 2.5 mg Oral Tablet Take 1 Tablet (2.5 mg total) by mouth Twice daily for 30 days    finasteride (PROSCAR) 5 mg Oral Tablet Take 1 Tablet (5 mg total) by mouth Once a day    gabapentin (NEURONTIN) 300 mg Oral Capsule Take 1 Capsule (300 mg total) by mouth Four times a day (Patient taking differently: Take 1 Capsule (300 mg total) by mouth Four times a day Takes 2 capsules bid)    lisinopriL (PRINIVIL) 40 mg Oral Tablet Take 1 Tablet (40 mg total) by mouth Once a day for 180 days    metoprolol tartrate (LOPRESSOR) 25 mg Oral Tablet Take 1 Tablet (25 mg total) by mouth Twice daily for 180 days     multivitamin with iron Oral Tablet Take 1 Tablet by mouth Once a day    ondansetron (ZOFRAN ODT) 4 mg Oral Tablet, Rapid Dissolve Take 1 Tablet (4 mg total) by mouth Every 8 hours as needed for Nausea/Vomiting    oxyCODONE-acetaminophen (PERCOCET) 5-325 mg Oral Tablet Take 1 Tablet by mouth Every 4 hours as needed for Pain 2 tabs by mouth for severe pain  Do not exceed 2 tabs every 4 hours    triamcinolone acetonide (ARISTOCORT A) 0.1 % Ointment Apply topically Twice daily    vit C/vit E acet/lutein/min (OCUVITE LUTEIN ORAL) Take 1 Tablet by mouth Once a day       No Known Allergies       Past Medical History:  Past Medical History:   Diagnosis Date    Chronic pain     "lower back, right knee and arthritis"    DVT (deep venous thrombosis) (CMS HCC)     "left groin"    Essential hypertension     Neuropathy (CMS HCC)     Spinal cord compression (CMS HCC)     Wears glasses     readers       Past Surgical History:  Past Surgical History:  Procedure Laterality Date    Hx cervical laminectomy  03/2021    Hx cervical spine surgery      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    Laminectomy  03/2016    Replacement total knee Right 03/2016       Social History:  Social History     Tobacco Use    Smoking status: Former     Average packs/day: 1 pack/day for 20.0 years (20.0 ttl pk-yrs)     Types: Cigarettes     Start date: 1966    Smokeless tobacco: Never   Vaping Use    Vaping status: Never Used   Substance Use Topics    Alcohol use: Not Currently    Drug use: Not Currently     Types: Marijuana     Comment: medical marijuana card     Social History     Substance and Sexual Activity   Drug Use Not Currently    Types: Marijuana    Comment: medical marijuana card       Family History:  Family History   Problem Relation Age of Onset    Cancer Mother     Macular Degen Mother     Heart Disease Father          Old records reviewed.      Objective:  Nursing notes reviewed    Filed Vitals:    11/11/22 0100 11/11/22 0115 11/11/22 0130  11/11/22 0145   BP:   112/67 121/80   Pulse:       Resp:    16   Temp:    36.8 C (98.2 F)   SpO2: 99% 99% 99% 100%       Physical Exam  Nursing note and vitals reviewed.  Vital signs reviewed as above.     Constitutional: Pt is well-developed and well-nourished.   Head: Normocephalic and atraumatic.   Eyes: Conjunctivae are normal. Pupils are equal, round  Neck: Soft, supple, full range of motion.  Cardiovascular: RRR. No Murmurs/rubs/gallops. Distal pulses present and equal bilaterally.  Pulmonary/Chest: Normal BS BL with no distress. No audible wheezes or crackles are noted.  Musculoskeletal: Normal range of motion. No deformities.  Exhibits no edema and no tenderness.   Neurological: AOx3. CNs 2-12 grossly intact.  No focal deficits noted.  Skin: Incision line has a bleed in the superior aspect of the incision line. Warm and dry. No rash or lesions.  Psychiatric: Patient has a normal mood and affect.     Work-up:  Orders Placed This Encounter    CBC/DIFF    PT/INR    CBC WITH DIFF    INSERT & MAINTAIN PERIPHERAL IV ACCESS    PERIPHERAL IV DRESSING CHANGE    NS flush syringe    NS flush syringe    NS bolus infusion 1,000 mL    oxyCODONE-acetaminophen (PERCOCET) 5-325mg  per tablet        Labs:  Results for orders placed or performed during the hospital encounter of 11/10/22 (from the past 24 hour(s))   CBC/DIFF    Narrative    The following orders were created for panel order CBC/DIFF.  Procedure                               Abnormality         Status                     ---------                               -----------         ------  CBC WITH ZOXW[[id number]]                Abnormal            Final result                 Please view results for these tests on the individual orders.   PT/INR   Result Value Ref Range    PROTHROMBIN TIME 11.4 9.7 - 13.6 seconds    INR 0.97 <=5.00   CBC WITH DIFF   Result Value Ref Range    WBC 8.9 3.7 - 11.0 x10^3/uL    RBC 3.76 (L) 4.50 - 6.10 x10^6/uL     HGB 11.9 (L) 13.4 - 17.5 g/dL    HCT 11.9 (L) 14.7 - 52.0 %    MCV 92.3 78.0 - 100.0 fL    MCH 31.6 26.0 - 32.0 pg    MCHC 34.3 31.0 - 35.5 g/dL    RDW-CV 82.9 56.2 - 13.0 %    PLATELETS 107 (L) 150 - 400 x10^3/uL    MPV 9.5 8.7 - 12.5 fL    NEUTROPHIL % 71.0 %    LYMPHOCYTE % 13.0 %    MONOCYTE % 10.0 %    EOSINOPHIL % 5.0 %    BASOPHIL % 0.0 %    NEUTROPHIL # 6.30 1.50 - 7.70 x10^3/uL    LYMPHOCYTE # 1.19 1.00 - 4.80 x10^3/uL    MONOCYTE # 0.89 0.20 - 1.10 x10^3/uL    EOSINOPHIL # 0.42 <=0.50 x10^3/uL    BASOPHIL # <0.10 <=0.20 x10^3/uL    IMMATURE GRANULOCYTE % 1.0 0.0 - 1.0 %    IMMATURE GRANULOCYTE # <0.10 <0.10 x10^3/uL   Results for orders placed or performed during the hospital encounter of 11/09/22 (from the past 24 hour(s))   H & H - postop day 1   Result Value Ref Range    HGB 12.3 (L) 13.4 - 17.5 g/dL    HCT 86.5 (L) 78.4 - 52.0 %       Abnormal Lab results:  Labs Ordered/Reviewed   CBC WITH DIFF - Abnormal; Notable for the following components:       Result Value    RBC 3.76 (*)     HGB 11.9 (*)     HCT 34.7 (*)     PLATELETS 107 (*)     All other components within normal limits   PT/INR - Normal   CBC/DIFF    Narrative:     The following orders were created for panel order CBC/DIFF.                  Procedure                               Abnormality         Status                                     ---------                               -----------         ------  CBC WITH MVHQ[[id number]]                Abnormal            Final result                                                 Please view results for these tests on the individual orders.         Plan: Appropriate labs ordered. Medical Records reviewed.    MDM:     ED Course as of 11/11/22 0300   Thu Nov 10, 2022   3323 77 year old male patient presented to the emergency department with bleeding from his incision line his right leg he was discharged today from the hospital for total knee replacement was done  yesterday by Dr. Katherene Ponto.  Patient denies any fever, chills, chest pain, shortness of breath.  Differential diagnosis postop hemorrhage, venous bleeder, among other diagnoses not listed.  Spoke with Dr. Izell Carolina he recommended a pressure dressing with some Surgicel and see him in the office tomorrow his blood counts are all normal   2318 Patient has a pressure dressing placed on his knee.  Hemoglobin is 11.9 hematocrit 34.7.   2359 Spoke with Dr. Katherene Ponto and told him that I would spoken with Dr. Izell Carolina and he agreed with the treatment of the pressure dressing rest and follow-up with orthopedics tomorrow.       Medical Decision Making  Amount and/or Complexity of Data Reviewed  Labs: ordered.    Risk  Prescription drug management.        Consults:   2234 - Spoke to Dr. Izell Carolina about the above H&P, along with the results. Recommends tight pressure dressing and follow up in office in the morning.     Impression:   Diagnosis       Diagnosis Comment Added By Time Added    Visit for wound check  Joseph Berkshire, DO 11/11/2022  1:38 AM           Disposition:  Discharged     Discussed with patient all lab results, diagnosis, and treatment, and need for follow up.   It was advised that the patient return to the ED with any new, concerning, or worsening symptoms and follow up as directed.   The patient verbalized understanding of all instructions and had no further questions or concerns.     Follow up:   Margot Chimes, MD  [address]  [address]  [place] [address]  [phone]    In 1 day  For knee incision line bleeding        I am scribing for, and in the presence of, Dr. Drema Halon for services provided on 11/10/2022.  Ramiro Harvest, SCRIBE     Ramiro Harvest, SCRIBE  11/10/2022, 22:24      I personally performed the services described in this documentation, as scribed  in my presence, and it is both accurate  and complete.    Joseph Berkshire, DO     This note may have been partially generated using MModal Fluency Direct  system, and there may be some incorrect words, spellings, and punctuation that were not noted in checking the note before saving

## 2022-11-10 NOTE — Discharge Summary (Signed)
[facility]  DISCHARGE SUMMARY      PATIENT NAME:  Gary White, Gary White  MRN:  [id number]  DOB:  [birth year]    ENCOUNTER DATE:  11/09/2022  INPATIENT ADMISSION DATE: 11/09/2022  DISCHARGE DATE:  11/10/2022    ATTENDING PHYSICIAN: Antoine Primas, MD  SERVICE: [facility]  PRIMARY CARE PHYSICIAN: Wilmon Pali, MD     Reason for Admission       Diagnosis    Painful orthopaedic hardware (CMS [facility]) [[id number]]            DISCHARGE DIAGNOSIS:     Principal Problem:  S/P revision of total knee, right    Active Hospital Problems    Diagnosis Date Noted    Principal Problem: S/P revision of total knee, right [Z96.651] 11/09/2022    Painful orthopaedic hardware (CMS [facility]) [T84.84XA] 11/09/2022    History of deep vein thrombosis [Z86.718] 11/09/2022    H/O degenerative disc disease [Z87.39] 11/09/2022    Essential hypertension [I10] 09/01/2022      Resolved Hospital Problems   No resolved problems to display.     Active Non-Hospital Problems    Diagnosis Date Noted    Atherosclerosis of aorta (CMS HCC) 09/01/2022    Disorder involving thrombocytopenia (CMS HCC) 09/01/2022    H/O laminectomy 09/01/2022    Deep vein thrombosis (DVT) of left lower extremity (CMS HCC) 09/01/2022    Joint laxity of right knee 09/01/2022    Peripheral vascular disease (CMS HCC) 10/28/2021    Multilevel degenerative disc disease 01/03/2017    Osteoarthritis of right knee 04/14/2016    Benign non-nodular prostatic hyperplasia with lower urinary tract symptoms 12/09/2014    Neuropathy (CMS HCC) 12/09/2008      No Known Allergies         DISCHARGE MEDICATIONS:     Current Discharge Medication List        START taking these medications.        Details   aspirin 81 mg Tablet, Delayed Release (E.C.)  Commonly known as: ECOTRIN   81 mg, Oral, 2 TIMES DAILY  Refills: 0     ondansetron 4 mg Tablet, Rapid Dissolve  Commonly known as: ZOFRAN ODT   4 mg, Oral, EVERY 8 HOURS PRN  Qty: 20 Tablet  Refills: 0     oxyCODONE-acetaminophen 5-325 mg  Tablet  Commonly known as: PERCOCET   1 Tablet, Oral, EVERY 4 HOURS PRN, 2 tabs by mouth for severe pain Do not exceed 2 tabs every 4 hours  Qty: 30 Tablet  Refills: 0            CONTINUE these medications - NO CHANGES were made during your visit.        Details   finasteride 5 mg Tablet  Commonly known as: PROSCAR   5 mg, Oral, DAILY  Qty: 90 Tablet  Refills: 1     gabapentin 300 mg Capsule  Commonly known as: NEURONTIN   300 mg, Oral, 4 TIMES DAILY  Qty: 120 Capsule  Refills: 2     lisinopriL 40 mg Tablet  Commonly known as: PRINIVIL   40 mg, Oral, DAILY  Qty: 90 Tablet  Refills: 1     metoprolol tartrate 25 mg Tablet  Commonly known as: LOPRESSOR   25 mg, Oral, 2 TIMES DAILY  Qty: 180 Tablet  Refills: 1     multivitamin with iron Tablet   1 Tablet, Oral, DAILY  Refills: 0     OCUVITE LUTEIN  ORAL   1 Tablet, Oral, DAILY  Refills: 0     triamcinolone acetonide 0.1 % Ointment   Apply Topically, 2 TIMES DAILY  Qty: 80 g  Refills: 0            Discharge med list refreshed?  YES        DISCHARGE INSTRUCTIONS:  Post-Discharge Follow Up Appointments       Go to Cristy Folks, Cordelia Poche in 2 week(s)    Phone: [phone]    Where: [facility]      Thursday Nov 17, 2022    New Patient Visit with Glade Nurse, PT, DPT at  1:45 PM      Wednesday Nov 23, 2022    Post-Operative Visit with Bernardo Heater at  9:45 AM      Wednesday Mar 08, 2023    Return Patient Visit with Wilmon Pali, MD at  3:00 PM      Wednesday Nov 08, 2023    Nurse Visit with Nurse, Orthopedics Melina Modena [facility] at  9:30 AM      Internal Medicine, [place], [place]  [address] [place] [address]  [phone] Orthopedics, [facility]  [facility] [address], [place]  [address]  [place] [address]  [phone] Orthopedics, [facility] [address] [place]  [address]  [place]  [address]  [phone]    Physical Therapy, [facility]  [place], [place]  [address]  [place] [address]  [phone]             AMB CONSULT/REFERRAL PHYS THERAPY- EXTERNAL     DISCHARGE INSTRUCTION - DIET    Please resume a healthy, well-balanced diet upon discharge.     Diet: Z - other (specify in comments) Please resume a healthy, well-balanced diet upon discharge.     HIP & KNEE - DISCHARGE ACTIVITY INSTRUCTIONS     Activity: WEIGHT BEARING AS TOLERATED      DISCHARGE INSTRUCTION - DVT (BLOOD CLOTS) PREVENTION    -Swelling of the operative leg is normal after discharge because you will be more active at home. If however you develop new pain in the groin, thigh, or calf and if swelling is unrelieved with elevation, this may be a sign of a DVT (blood clot). Please notify your surgeon immediately if this occurs or go the nearest ER to have an ultrasound of your leg.    -Bruising of the operative leg is normal and sometimes the bruising travels all the way to the ankle, foot and toes.  This will gradually go from purple to green to yellow before it disappears completely.      -For additional DVT prevention TED hose (stockings) may be recommended for 4 weeks. These should be worn for at least 18 hours per day. They may be washed if they become soiled.    -Your doctor may also prescribe medication, usually Aspirin, Eliquis or Xarelto, to help prevent blood clots.  Take this medication as prescribed.     DISCHARGE INSTRUCTION - TUB/ SHOWER BENCH    Consider purchasing a tub/shower bench, usually NOT covered by insurance company.     SURGICAL DRESSING INSTRUCTIONS     DRESSING TYPE: OPTIFOAM GENTLE POST-OP AG+    FREQUENCY OF DRESSING CHANGE: KEEP DRESSING IN PLACE FOR 3 DAYS, THEN CHANGE    IF DRESSING IS SATURATED, PLEASE REINFORCE, AVOID DRESSING CHANGE BEFORE THE FREQUENCY STATED ABOVE  Yes    SOLUTION TO CLEANSE: NO SOLUTION ON THE INCISION  ITSELF BUT AROUND INCISION CAN BE CLEANED WITH ALCOHOL      DISCHARGE INSTRUCTION - INCISION/WOUND CARE    STANDARD INSTRUCTIONS- Leave bandage in place until follow-up appointment.  Reinforce the dressing as needed.  You may shower but keep the bandage clean, dry and intact.   You may NOT submerge the incision until told otherwise by your surgeon (no swimming or tub bathing).  - Do not apply creams, lotions, salves or medicated ointments to the incision.  Simply apply dry gauze and nothing more.  - Please call me or your orthopedic nurse clinician for ANY DRAINAGE that occurs more than 7 days after surgery     Instructions for incision/wound care: Wound Care as Instructed    Instructions for incision/wound care: Keep Incision Clean and Dry      DISCHARGE INSTRUCTION - FALL PREVENTION    A fall after surgery can be devastating.  Falls can result in dislocation, rupturing a ligament or tendon, or breaking a bone around the new prosthesis. These injuries can be extremely difficult to repair and often require a revision of the entire prosthesis.  Revision operations are associated with a higher complication rate.  Therefore it is crucial that you avoid falling after surgery.  Many falls are caused by poor lighting, a throw rug, a small pet, or too much haste.  Please take time to remove obstacles, secure pets somewhere safe, and improve lighting at home with the use of properly placed nightlights.     DISCHARGE INSTRUCTION - SIGNS/SYMPTOMS OF POST OPERATIVE INFECTION    -Increasing pain, redness, warmth, shaking chills, sweats or fever may be a sign of a post-operative infection.  -A fever is not uncommon the first three days after surgery. A fever of 100.5 or higher more than 72 hours after surgery is a concern and you should notify us if this occurs.  - Please inspect the incision every day. A little spotting on the dressing is normal. Drainage from the incision that persists more than 7 days after surgery may be a  sign of infection and requires my immediate attention.     - If you have questions or concerns please call your surgeon's office.  -No one should place you on an antibiotic for signs of a surgical site infection without first consulting the surgeon.   - No dental procedures for 6 weeks as this can cause infection in your new joint replacement  - You must receive antibiotics immediately prior to dental visits and procedures like colonoscopy and cystoscopy. This is a life-long recommendation.     DISCHARGE INSTRUCTION - POST-OPERATIVE PAIN    Ice may be applied 20 minutes at a time every 2 hours as needed.  Be careful not to apply ice to bare skin as ice may cause burns just like heat.  - PRESCRIPTION NARCOTIC PAIN RELIEVERS ARE HABIT FORMING.  THEY SHOULD BE RESERVED FOR MODERATE TO SEVERE PAIN.  - Prescription pain relievers are constipating for most people.  Be prepared with over the counter remedies like Milk of Magnesia, Prune Juice, Hot Tea, etc.  Fleets enemas and suppositories are available over the counter as well.          REASON FOR HOSPITALIZATION AND HOSPITAL COURSE:  This is a 77 y.o., male  that I have been following as an outpatient secondary to Painful orthopaedic hardware (CMS [facility]) [T84.84XA]  he went on to fail conservative treatment  of their arthritic joint and elected to proceed with a total joint replacement.  his  interoperative procedure was uncomplicated.  .  DVT prophylaxis performed with ASA 81 mg bid. SCDs also implemented for DVT prophylactics.  The patient was managed medically by Dr. Geanie Cooley. The patient was stable through their course of stay here in the hospital.  They progressed well with physical therapy and will be discharged to Home discharge .  We have educated the patient on the discharge process,  reviewed med reconciliation, and once again reviewed postoperative goals.   Questions were encouraged and answered.         CONDITION ON DISCHARGE:  A. Ambulation: Ambulation with  assistive device  B. Self-care Ability: With partial assistance  C. Cognitive Status Oriented x 3  D. DNR status at discharge: Full Code  E. Lace + Score:         Advance Directive Information      Flowsheet Row Most Recent Value   Does the Patient have an Advance Directive? Yes, Patient Does Have Advance Directive for Healthcare Treatment   Type of Advance Directive Completed Medical Living Will, Medical Power of Attorney            DISCHARGE DISPOSITION:    DISCHARGE PATIENT   Ordered at: 11/10/22 1610     Is there a planned readmission to acute care within 30 days?    No     Disposition:    HOME/SELF CARE/WITH FAMILY MEMBER/OTHER           Jefm Bryant, PA-C    Time spent on discharge greater than 30 minutes? No      Copies sent to Care Team         Relationship Specialty Notifications Start End    Wilmon Pali, MD PCP - General INTERNAL MEDICINE All results, Admissions 09/02/22     Phone: [phone] Fax: [phone]         [address] [place] [address]            Referring providers can utilize [email or web address] to access their referred [facility] patient's information.

## 2022-11-10 NOTE — ED Triage Notes (Addendum)
Pt had total R knee replacement yesterday by Dr Katherene Ponto. Pt was discharged today and returned home. Pt arrives to ED by EMS c/o R knee pain and "a lot of bleeding" from site that he believes started around 1800. Pt arrives with pressure dressing in place by EMS. Pt denies any injury post surgery and denies use of thinners.

## 2022-11-10 NOTE — Nurses Notes (Signed)
Pt dced. IV removed. Reviewed DC instructions with pt. Pt denies questions. ICE man packed to take home for patient. Pt medicated for pain prior to leaving with percocet. Scripts given for percocet and Zofran. Pt off floor in w/c with staff escort. Dispo home with son Susy Frizzle.

## 2022-11-10 NOTE — Care Plan (Signed)
[facility]  Rehabilitation Services  Physical Therapy Progress Note      Patient Name: Gary White  Date of Birth: 1945-12-30  Height:  175.3 cm (5\' 9" )  Weight:  91.4 kg (201 lb 8 oz)  Room/Bed: 404/A  Payor: MEDICARE / Plan: MEDICARE PART A AND B / Product Type: Medicare /     Assessment:        11/10/22 0934   Basic Mobility Am-PAC/6Clicks Score (APPROVED Staff)   Turning in bed without bedrails 4   Lying on back to sitting on edge of flat bed 4   Moving to and from a bed to a chair 3   Standing up from chair 4   Walk in room 3   Climbing 3-5 steps with railing 3   6 Clicks Raw Score total 21   Standardized (t-scale) score 45.55     A: Pt tolerated treatment favorably. Pt is pleasant and cooperative. Pt is limited by decreased RLE stability, weakness and balance impairment. Pt required SBAx1 for transfers and CGA/MINAx1 for gait-training due to 2 losses of balance with good self-recovery. Pt ambulated 25 feet, then required brief standing rest due to decreased RLE stability and knee buckled, then ambulated another 25 feet with one more loss of balance with pt able to lean on 4-wheeled rollator walker for balance recovery. Provided cues for rest break when needed. Pt up in recliner at end of treatment; kindly declined chair alarm. Call light, tray table and phone within reach and all needs met.      D/C Disposition:    Home with Outpt PT soon    Plan:   Continue to follow patient according to established plan of care.  The risks/benefits of therapy have been discussed with the patient/caregiver and he/she is in agreement with the established plan of care.     Subjective & Objective:     S: Pt resting upright in recliner upon initiation of PT session. Cleared to participate in treatment per pt's nurse, Asher Muir, RN, and pt agreeable to do so. Identified pt by name, wristband and DOB. No visitors present. Pt had no complaints of pain. Pt is on room air.     O: Instructed pt in RLE TKA therapeutic  exercises including LAQ 1x15 and supine AROM quad sets focusing on quad contraction and avoiding compensatory strategies including activating gluteal and calf muscles, AAROM SLR 1x10, AROM hip abduction/adduction 1x15, AROM sheet-assist heel slide 1x15, AROM SAQ 1x15 and ankle pumps 1x20.     SIT to STAND at [facility] rollator walker, SBAx1, Gait-training with 4-wheeled rollator walker, 25 feet x 2, no chair follow. Attempted to initiate stair-climbing two times however pt declined stating pt felt confident performing 2 steps to enter home. Reviewed sequencing remembering to ascend with stronger LLE and to descend with weaker RLE. STAND to SIT CGAx1.     Therapist:   Chinita Greenland, [place]  [id number]    Start Time: 0908  End Time: 0934  AM Treatment Time: 26 minutes    Total Treatment Time: 26 minutes  Charges Entered: gait, pttx  Department Number: [id number]

## 2022-11-10 NOTE — Care Management Notes (Signed)
[facility]  Care Management Initial Evaluation    Patient Name: Gary White  Date of Birth: January 20, 1946  Sex: male  Date/Time of Admission: 11/09/2022 10:46 AM  Room/Bed: 404/A  Payor: MEDICARE / Plan: MEDICARE PART A AND B / Product Type: Medicare /   Primary Care Providers:  Wilmon Pali, MD, MD (General)    Pharmacy Info:   Preferred Pharmacy       [facility] [place] [place] [address]    [address] [place] [address]    Phone: [phone] Fax: [phone]    Hours: Not open 24 hours          Emergency Contact Info:   Extended Emergency Contact Information  Primary Emergency Contact: Fails,[name]  Mobile Phone: [phone]  Relation: Alla Feeling  Interpreter needed? No  Secondary Emergency Contact: [name],[name]  Address: [address]           [place], [place] [address] [place]  Home Phone: [phone]  Work Phone: [phone]  Mobile Phone: [phone]  Relation: Malachy Mood needed? No    History:   Gary White is a 77 y.o., male, admitted outpatient     Height/Weight: 175.3 cm (5\' 9" ) / 91.4 kg (201 lb 8 oz)     LOS: 0 days   Admitting Diagnosis: Painful orthopaedic hardware (CMS [facility]) [S06.30ZS]    Assessment:      11/10/22 1107   Assessment Details   Assessment Type Admission   Date of Care Management Update 11/10/22   Living Environment   Lives With alone   Living Arrangements house   Care Management Plan   Discharge Planning Status initial meeting   Discharge plan discussed with: Patient   Discharge Needs Assessment   Equipment Currently Used at Home walker, front wheeled;walker, rolling;walker, standard;cane, straight   Equipment Needed After Discharge none   Discharge Facility/Level of Care Needs Home (Patient/Family Member/other)(code 1)   ADVANCE DIRECTIVES   Does the Patient have an Advance Directive? Yes, Patient Does Have Advance Directive for Healthcare Treatment   Type of Advance Directive Completed Medical Power of  Attorney   Copy of Advance Directives in Chart? No, Copy Requested From Patient   Name of MPOA or Healthcare Surrogate Gary White   Mutuality/Individual Preferences    Patient-Specific Preferences Prefers follow up appointments on Wednesday or Thursday.         Discharge Plan:  Home (Patient/Family Member/other) (code 1)    Spoke to the patient regarding discharge planning. He lives alone in a house; his son is going to stay with him for a while after going home. He has a rollator, FWW, standard walker, and cane. He denies the use of oxygen and home health. He has used home health in the past. He has appointment set up for outpatient PT evaluation. He plans to return home at discharge.     Eliquis prescription added after discharge. Called patient's pharmacy; cost was over $500. Provided pharmacy with 30 day free card. The patient only needs a 30 day supply. Called and updated the patient on Eliquis prescription.     The patient will continue to be evaluated for developing discharge needs.     Case Manager: Orpah Clinton, CASE MANAGER  Phone: [phone]

## 2022-11-10 NOTE — Care Plan (Signed)
Problem: Adult Inpatient Plan of Care  Goal: Plan of Care Review  11/10/2022 0445 by Jaclynn Major, RN  Outcome: Ongoing (see interventions/notes)  Flowsheets (Taken 11/10/2022 0445)  Progress: no change  Plan of Care Reviewed With: patient  11/10/2022 0443 by Jaclynn Major, RN  Outcome: Ongoing (see interventions/notes)  Goal: Patient-Specific Goal (Individualized)  11/10/2022 0445 by Jaclynn Major, RN  Outcome: Ongoing (see interventions/notes)  Flowsheets (Taken 11/10/2022 0445)  Patient-Specific Goals (Include Timeframe): d/c home today  Plan of Care Reviewed With: patient  11/10/2022 0443 by Jaclynn Major, RN  Outcome: Ongoing (see interventions/notes)  Goal: Absence of Hospital-Acquired Illness or Injury  11/10/2022 0445 by Jaclynn Major, RN  Outcome: Ongoing (see interventions/notes)  11/10/2022 0443 by Jaclynn Major, RN  Outcome: Ongoing (see interventions/notes)  Intervention: Identify and Manage Fall Risk  Recent Flowsheet Documentation  Taken 11/09/2022 2040 by Jaclynn Major, RN  Safety Promotion/Fall Prevention:   activity supervised   fall prevention program maintained   nonskid shoes/slippers when out of bed   safety round/check completed  Intervention: Prevent Skin Injury  Recent Flowsheet Documentation  Taken 11/09/2022 2040 by Jaclynn Major, RN  Body Position: supine, head elevated  Skin Protection:   adhesive use limited   transparent dressing maintained   incontinence pads utilized  Intervention: Prevent and Manage VTE (Venous Thromboembolism) Risk  Recent Flowsheet Documentation  Taken 11/10/2022 0400 by Jaclynn Major, RN  VTE Prevention/Management:   foot pump device on   anticoagulant therapy maintained   dorsiflexion/plantar flexion performed  Intervention: Prevent Infection  Recent Flowsheet Documentation  Taken 11/09/2022 2040 by Jaclynn Major, RN  Infection Prevention:   barrier precautions utilized   promote handwashing   rest/sleep promoted   single patient room provided  Goal:  Optimal Comfort and Wellbeing  11/10/2022 0445 by Jaclynn Major, RN  Outcome: Ongoing (see interventions/notes)  11/10/2022 0443 by Jaclynn Major, RN  Outcome: Ongoing (see interventions/notes)  Intervention: Provide Person-Centered Care  Recent Flowsheet Documentation  Taken 11/09/2022 2040 by Jaclynn Major, RN  Trust Relationship/Rapport:   care explained   questions answered   choices provided   reassurance provided   thoughts/feelings acknowledged  Goal: Rounds/Family Conference  11/10/2022 0445 by Jaclynn Major, RN  Outcome: Ongoing (see interventions/notes)  11/10/2022 0443 by Jaclynn Major, RN  Outcome: Ongoing (see interventions/notes)     Problem: Pain Acute  Goal: Optimal Pain Control and Function  11/10/2022 0445 by Jaclynn Major, RN  Outcome: Ongoing (see interventions/notes)  11/10/2022 0443 by Jaclynn Major, RN  Outcome: Ongoing (see interventions/notes)  Intervention: Prevent or Manage Pain  Recent Flowsheet Documentation  Taken 11/09/2022 2040 by Jaclynn Major, RN  Sleep/Rest Enhancement:   awakenings minimized   noise level reduced   regular sleep/rest pattern promoted   room darkened  Intervention: Optimize Psychosocial Wellbeing  Recent Flowsheet Documentation  Taken 11/09/2022 2040 by Jaclynn Major, RN  Diversional Activities:   television   smartphone  Supportive Measures: active listening utilized     Problem: Fall Injury Risk  Goal: Absence of Fall and Fall-Related Injury  11/10/2022 0445 by Jaclynn Major, RN  Outcome: Ongoing (see interventions/notes)  11/10/2022 0443 by Jaclynn Major, RN  Outcome: Ongoing (see interventions/notes)  Intervention: Identify and Manage Contributors  Recent Flowsheet Documentation  Taken 11/09/2022 2040 by Jaclynn Major, RN  Self-Care Promotion:   independence encouraged   BADL personal objects within reach   BADL personal routines maintained  Intervention: Promote Injury-Free Environment  Recent Flowsheet Documentation  Taken 11/09/2022 2040 by  Jaclynn Major, RN  Safety Promotion/Fall Prevention:   activity supervised   fall prevention program maintained   nonskid shoes/slippers when out of bed   safety round/check completed     Problem: Knee Arthroplasty  Goal: Optimal Coping  11/10/2022 0445 by Jaclynn Major, RN  Outcome: Ongoing (see interventions/notes)  11/10/2022 0443 by Jaclynn Major, RN  Outcome: Ongoing (see interventions/notes)  Intervention: Support Psychosocial Response to Surgery and Mobility Changes  Recent Flowsheet Documentation  Taken 11/09/2022 2040 by Jaclynn Major, RN  Supportive Measures: active listening utilized  Goal: Absence of Bleeding  11/10/2022 0445 by Jaclynn Major, RN  Outcome: Ongoing (see interventions/notes)  11/10/2022 0443 by Jaclynn Major, RN  Outcome: Ongoing (see interventions/notes)  Goal: Effective Bowel Elimination  11/10/2022 0445 by Jaclynn Major, RN  Outcome: Ongoing (see interventions/notes)  11/10/2022 0443 by Jaclynn Major, RN  Outcome: Ongoing (see interventions/notes)  Goal: Fluid and Electrolyte Balance  11/10/2022 0445 by Jaclynn Major, RN  Outcome: Ongoing (see interventions/notes)  11/10/2022 0443 by Jaclynn Major, RN  Outcome: Ongoing (see interventions/notes)  Goal: Optimal Functional Ability  11/10/2022 0445 by Jaclynn Major, RN  Outcome: Ongoing (see interventions/notes)  11/10/2022 0443 by Jaclynn Major, RN  Outcome: Ongoing (see interventions/notes)  Intervention: Promote Optimal Functional Status  Recent Flowsheet Documentation  Taken 11/09/2022 2040 by Jaclynn Major, RN  Self-Care Promotion:   independence encouraged   BADL personal objects within reach   BADL personal routines maintained  Activity Management: ROM, active encouraged  Goal: Absence of Infection Signs and Symptoms  11/10/2022 0445 by Jaclynn Major, RN  Outcome: Ongoing (see interventions/notes)  11/10/2022 0443 by Jaclynn Major, RN  Outcome: Ongoing (see interventions/notes)  Intervention: Prevent or Manage  Infection  Recent Flowsheet Documentation  Taken 11/09/2022 2040 by Jaclynn Major, RN  Fever Reduction/Comfort Measures:   lightweight bedding   lightweight clothing   medication administered  Infection Prevention:   barrier precautions utilized   promote handwashing   rest/sleep promoted   single patient room provided  Infection Management: aseptic technique maintained  Goal: Intact Neurovascular Status  11/10/2022 0445 by Jaclynn Major, RN  Outcome: Ongoing (see interventions/notes)  11/10/2022 0443 by Jaclynn Major, RN  Outcome: Ongoing (see interventions/notes)  Goal: Anesthesia/Sedation Recovery  11/10/2022 0445 by Jaclynn Major, RN  Outcome: Ongoing (see interventions/notes)  11/10/2022 0443 by Jaclynn Major, RN  Outcome: Ongoing (see interventions/notes)  Intervention: Optimize Anesthesia Recovery  Recent Flowsheet Documentation  Taken 11/09/2022 2040 by Jaclynn Major, RN  Safety Promotion/Fall Prevention:   activity supervised   fall prevention program maintained   nonskid shoes/slippers when out of bed   safety round/check completed  Goal: Acceptable Pain Control  11/10/2022 0445 by Jaclynn Major, RN  Outcome: Ongoing (see interventions/notes)  11/10/2022 0443 by Jaclynn Major, RN  Outcome: Ongoing (see interventions/notes)  Intervention: Prevent or Manage Pain  Recent Flowsheet Documentation  Taken 11/09/2022 2040 by Jaclynn Major, RN  Diversional Activities:   television   smartphone  Goal: Nausea and Vomiting Relief  11/10/2022 0445 by Jaclynn Major, RN  Outcome: Ongoing (see interventions/notes)  11/10/2022 0443 by Jaclynn Major, RN  Outcome: Ongoing (see interventions/notes)  Goal: Effective Urinary Elimination  11/10/2022 0445 by Jaclynn Major, RN  Outcome: Ongoing (see interventions/notes)  11/10/2022 0443 by Jaclynn Major, RN  Outcome: Ongoing (see interventions/notes)  Goal: Effective Oxygenation and Ventilation  11/10/2022 0445 by Jaclynn Major, RN  Outcome: Ongoing (see  interventions/notes)  11/10/2022 0443 by Jaclynn Major, RN  Outcome: Ongoing (see interventions/notes)  Intervention: Optimize Oxygenation  and Ventilation  Recent Flowsheet Documentation  Taken 11/09/2022 2040 by Jaclynn Major, RN  Head of Bed [facility]) Positioning: HOB at 20-30 degrees     Patient's pain being controlled with medication prn. Antibiotics administered as ordered. Neuro checks within normal limits. Dressing clean/dry/intact. Plans on being d/c home today.  Jaclynn Major, RN

## 2022-11-10 NOTE — ED Nurses Note (Signed)
Dressing applied to right knee with surgicele, telfa, kerlex and ace wrap. Bleeding controlled at this time.

## 2022-11-10 NOTE — OT Treatment (Signed)
[facility]  Rehabilitation Services  Occupational Therapy Progress Note    Patient Name: Gary White  Date of Birth: [birth year]  Height:  175.3 cm (5\' 9" )  Weight:  91.4 kg (201 lb 8 oz)  Room/Bed: 404/A  Payor: MEDICARE / Plan: MEDICARE PART A AND B / Product Type: Medicare /     Assessment:       11/10/22 0855   Therapist Pager   OT Assigned/ Pager # [phone]   Daily Activity AM-PAC/6-clicks Score   Putting on/Taking off clothing on lower body 4   Bathing 4   Toileting 4   Putting on/Taking off clothing on upper body 4   Personal grooming 4   Eating Meals 4   Raw Score Total 24   Standardized (t-scale) Score 57.54   CMS 0-100% Score 0   CMS Modifier CH         Discharge Disposition: Home with OP    Plan:   Continue to follow patient according to established plan of care.  The risks/benefits of therapy have been discussed with the patient/caregiver and he/she is in agreement with the established plan of care.     Subjective & Objective:   RN, Asher Muir cleared patient for therapy. Patient A/O x 4, pleasant and agreeable for therapy. Bed mobility supine to sit SBA. Sat edge of bed completed UE and LE dressing SBA. Sit to stand SBA for clothing management. Ambulated to/from bathroom with rollator SBA, manages R foot drop with ambulation. Stood at sink completed grooming SBA. Stand tolerance 3-4 minutes. Patient educated on ice man and verbalized understanding. Post therapy patient reclined in chair, call light in reach.     Pain: 6-7/10 with movement R knee          Therapist:   Toni Amend, COTA     Start Time: 0831  End Time: 0855  Total Treatment Time: 24 minutes  Charges Entered: 2 OTADL  Department Number: [id number]

## 2022-11-10 NOTE — Progress Notes (Signed)
[facility]  Orthopaedics Progress Note      Date of service: 11/10/2022  Post Op Day:  1 Day Post-Op   S/P Revision Right TKA    Subjective:     Patient seen and examined at bedside.  Pain well controlled. Good pain control with nerve block. Patient out of bed with PT yesterday. No complaints.      Objective:    Vital Signs:  Temp (24hrs) Max:37.3 C (99.1 F)      Temperature: 36.5 C (97.7 F) (11/10/22 0350)  Heart Rate: 72 (11/10/22 0350)  BP (Non-Invasive): 118/67 (11/10/22 0350)  Respiratory Rate: 16 (11/10/22 0350)  SpO2: 96 % (11/10/22 0350)    Current Medications:  aluminum-magnesium hydroxide-simethicone (MAG-AL PLUS) 200-200-20 mg per 5 mL oral liquid, 30 mL, Oral, Q4H PRN  ceFAZolin (ANCEF) 2 g in iso-osmotic 100 mL premix IVPB, 2 g, Intravenous, Q8H  docusate sodium (COLACE) capsule, 100 mg, Oral, 2x/day  finasteride (PROSCAR) tablet, 5 mg, Oral, Daily  gabapentin (NEURONTIN) capsule, 300 mg, Oral, 3x/day  HYDROmorphone (DILAUDID) 0.5 mg/0.5 mL injection, 0.25 mg, Intravenous, Q3H PRN  ketorolac (TORADOL) 30 mg/mL injection, 15 mg, Intravenous, Q6H  lisinopril (PRINIVIL) tablet, 40 mg, Oral, Daily  LR premix infusion, , Intravenous, Continuous  magnesium hydroxide (MILK OF MAGNESIA) 400mg  per 5mL oral liquid, 30 mL, Oral, Q12H PRN  metoprolol tartrate (LOPRESSOR) tablet, 25 mg, Oral, 2x/day  multivitamin (THERA) tablet, 1 Tablet, Oral, Daily  naloxone (NARCAN) 0.4 mg/mL injection, 0.4 mg, Intravenous, Q2 MIN PRN  naloxone (NARCAN) 1 mg/mL injection, 1 mg, Intravenous, Q2 MIN PRN  NS flush syringe, 3 mL, Intracatheter, Q8HRS  NS flush syringe, 3 mL, Intracatheter, Q1H PRN  ondansetron (ZOFRAN) 2 mg/mL injection, 4 mg, Intravenous, Q6H PRN  oxyCODONE-acetaminophen (PERCOCET) 5-325mg  per tablet, 1 Tablet, Oral, Q4H PRN  oxyCODONE-acetaminophen (PERCOCET) 5-325mg  per tablet, 2 Tablet, Oral, Q4H PRN  simethicone (MYLICON) chewable tablet, 80 mg, Oral, Q8H PRN        Today's Physical  Exam:    Incisions/Dressing:  Ace Bandage dressing in place. Dry and intact.  Can actively dorsiflex foot. Negative footdrop.  Normal postop swelling.  Thigh soft.  Neurovascular:  Baseline and intact  Calve: Supple    Labs Reviewed  Lab Results   Component Value Date    HGB 14.3 10/26/2022    HCT 40.8 10/26/2022    INR 4.13 10/09/2020       Assessment    1.  S/P Revision Right TKA      Plan:    1.  PT/OT: Per TKA/ [facility] protocol  2.  WEIGHT BEARING: Weight bear as tolerates  3.  DVT/PE: Continue mechanical SCD's while in bed. ASA 81 mg bid  4. Incentive Spirometry  5.  Post Operative goals reviewed.      Disposition Planning: Home discharge today if PT goes well.    Jefm Bryant, PA-C     11/10/2022, 06:56

## 2022-11-11 ENCOUNTER — Encounter (INDEPENDENT_AMBULATORY_CARE_PROVIDER_SITE_OTHER): Payer: Self-pay | Admitting: Rehabilitative and Restorative Service Providers"

## 2022-11-11 ENCOUNTER — Ambulatory Visit (INDEPENDENT_AMBULATORY_CARE_PROVIDER_SITE_OTHER): Payer: Medicare Other | Admitting: Rehabilitative and Restorative Service Providers"

## 2022-11-11 VITALS — BP 92/60 | HR 74 | Temp 98.5°F

## 2022-11-11 DIAGNOSIS — Z96651 Presence of right artificial knee joint: Secondary | ICD-10-CM

## 2022-11-11 DIAGNOSIS — Z471 Aftercare following joint replacement surgery: Secondary | ICD-10-CM

## 2022-11-11 MED ORDER — OXYCODONE-ACETAMINOPHEN 5 MG-325 MG TABLET
2.0000 | ORAL_TABLET | ORAL | Status: AC
Start: 2022-11-11 — End: 2022-11-11
  Administered 2022-11-11: 2 via ORAL
  Filled 2022-11-11: qty 2

## 2022-11-11 NOTE — ED Nurses Note (Signed)
Patient sitting up on the side of the cot watching TV. Respirations even, unlabored with NAD noted. Pink, warm, dry. No blood noted to dressing. Updated on plan of care.

## 2022-11-11 NOTE — Discharge Instructions (Signed)
Keep her knee elevated, rested this evening follow-up with orthopedics tomorrow return if any worsening symptoms

## 2022-11-11 NOTE — Progress Notes (Signed)
[facility]  Medical Progress Note      Date of Service:  11/10/22  Gary White, Gary White, 77 y.o. male  Date of Admission:  11/09/2022  Date of Birth:  [birth year]  PCP: Wilmon Pali, MD    LAY CAREGIVER   Appointed Lay Caregiver?: I Decline     HPI: Gary White is a 77 y.o., White male who follows with Wilmon Pali, MD for primary care.    Patient is treated for   Active Hospital Problems    Diagnosis    Primary Problem: S/P revision of total knee, right    Painful orthopaedic hardware (CMS HCC)    History of deep vein thrombosis    H/O degenerative disc disease    Essential hypertension   .    Patient has had an uneventful hospital course, post operative day 1.   Pain is controlled.  Tolerating a diet.   Participating with therapy.  Post operative hemoglobin is stable.       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    No Known Allergies    Examination:  Temperature: 36.6 C (97.9 F) Heart Rate: 73 BP (Non-Invasive): 120/68   Respiratory Rate: 17 SpO2: 97 %     Awake, alert, appropriate, no acute distress  Makes eye contact, engages in conversation and is appropriate  Participating with therapy.      Labs:    Results for orders placed or performed during the hospital encounter of 11/10/22 (from the past 24 hour(s))   CBC/DIFF    Narrative    The following orders were created for panel order CBC/DIFF.  Procedure                                Abnormality         Status                     ---------                               -----------         ------                     CBC WITH IONG[[id number]]                Abnormal            Final result                 Please view  results for these tests on the individual orders.   PT/INR   Result Value Ref Range    PROTHROMBIN TIME 11.4 9.7 - 13.6 seconds    INR 0.97 <=5.00   CBC WITH DIFF   Result Value Ref Range    WBC 8.9 3.7 - 11.0 x10^3/uL    RBC 3.76 (L) 4.50 - 6.10 x10^6/uL    HGB 11.9 (L) 13.4 - 17.5 g/dL    HCT 32.4 (L) 40.1 - 52.0 %    MCV 92.3 78.0 - 100.0 fL    MCH 31.6 26.0 - 32.0 pg    MCHC 34.3 31.0 - 35.5 g/dL    RDW-CV 02.7 25.3 - 66.4 %    PLATELETS 107 (L) 150 - 400 x10^3/uL    MPV 9.5 8.7 - 12.5 fL    NEUTROPHIL % 71.0 %    LYMPHOCYTE % 13.0 %    MONOCYTE % 10.0 %    EOSINOPHIL % 5.0 %    BASOPHIL % 0.0 %    NEUTROPHIL # 6.30 1.50 - 7.70 x10^3/uL    LYMPHOCYTE # 1.19 1.00 - 4.80 x10^3/uL    MONOCYTE # 0.89 0.20 - 1.10 x10^3/uL    EOSINOPHIL # 0.42 <=0.50 x10^3/uL    BASOPHIL # <0.10 <=0.20 x10^3/uL    IMMATURE GRANULOCYTE % 1.0 0.0 - 1.0 %    IMMATURE GRANULOCYTE # <0.10 <0.10 x10^3/uL       Imaging Studies:    Results for orders placed or performed during the hospital encounter of 11/09/22 (from the past 72 hour(s))   XR KNEE RIGHT 2 VIEW     Status: None    Narrative    History: Knee pain.    2 views of the knee are obtained. There are postoperative changes of a revised total knee arthroplasty. There are longstem femoral and tibial components which are cemented. Alignment appears anatomic. There is soft tissue swelling and soft tissue air related to the surgery. There is a suspected small joint effusion. There are severe arterial vascular calcifications      Impression    1. 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      Radiologist location ID: [id number]         DNR Status:  Full code    Assessment/Plan:   Active Hospital Problems    Diagnosis    Primary  Problem: S/P revision of total knee, right    Painful orthopaedic hardware (CMS HCC)    History of deep vein thrombosis    H/O degenerative disc disease    Essential hypertension     Agree with discharge home today.  Medical management as ordered.      DVT/PE Prophylaxis: per the orthopedic team     Level of Service: Moderate     Lavell Anchors, DO

## 2022-11-11 NOTE — Progress Notes (Signed)
[facility]  [address]  [facility] [place] [address]    Progress Note    Name: Gary White MRN:  [id number]   Date: 11/11/2022 DOB:  12/29/1945 (76 y.o.)     Reason for visit:  Status post revision right total knee arthroplasty, incision check 11/09/2022     History of Present Illness:  Gary White is a 77 y.o. male who presents for Post Op (Post op- Revision of right total knee arthroplasty - femoral and tibia components- 5/15/2024Katherene Ponto- Seen in ER yesterday for bleeding of bandage- advised by Izell Carolina to f/u today)  Gary White has no reported new trauma.  No reported fevers or chills.  Patient had to go to the emergency department 11/10/2022 due to bleeding from the incision site.  Patient was placed on Eliquis post surgery for deep vein thrombosis prophylaxis.  This was done because the patient had a left total knee arthroplasty, and had a deep vein thrombosis and was recently taken off of Coumadin.  He will be on a blood thinner for 2 weeks post surgery.  Outpatient physical therapy has been not been initiated, scheduled to start 11/17/2022 at [facility].  Patient has a postop appointment 11/23/2022.    PAST MEDICAL HISTORY:  Past Medical History:   Diagnosis Date    Chronic pain     "lower back, right knee and arthritis"    DVT (deep venous thrombosis) (CMS HCC)     "left groin"    Essential hypertension     Neuropathy (CMS HCC)     Spinal cord compression (CMS HCC)     Wears glasses     readers           PAST SURGICAL HISTORY:  Past Surgical History:   Procedure Laterality Date    HX CERVICAL LAMINECTOMY  03/2021    C7-T1    HX CERVICAL SPINE SURGERY      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    LAMINECTOMY  03/2016    L4-5    REPLACEMENT TOTAL KNEE Right 03/2016           MEDICATIONS:  Current Outpatient Medications   Medication Sig    apixaban (ELIQUIS) 2.5 mg Oral Tablet Take 1 Tablet (2.5 mg total) by mouth Twice daily for 30 days    finasteride (PROSCAR) 5 mg  Oral Tablet Take 1 Tablet (5 mg total) by mouth Once a day    gabapentin (NEURONTIN) 300 mg Oral Capsule Take 1 Capsule (300 mg total) by mouth Four times a day (Patient taking differently: Take 1 Capsule (300 mg total) by mouth Four times a day Takes 2 capsules bid)    lisinopriL (PRINIVIL) 40 mg Oral Tablet Take 1 Tablet (40 mg total) by mouth Once a day for 180 days    metoprolol tartrate (LOPRESSOR) 25 mg Oral Tablet Take 1 Tablet (25 mg total) by mouth Twice daily for 180 days    multivitamin with iron Oral Tablet Take 1 Tablet by mouth Once a day    ondansetron (ZOFRAN ODT) 4 mg Oral Tablet, Rapid Dissolve Take 1 Tablet (4 mg total) by mouth Every 8 hours as needed for Nausea/Vomiting    oxyCODONE-acetaminophen (PERCOCET) 5-325 mg Oral Tablet Take 1 Tablet by mouth Every 4 hours as needed for Pain 2 tabs by mouth for severe pain  Do not exceed 2 tabs every 4 hours    triamcinolone acetonide (ARISTOCORT A) 0.1 % Ointment Apply topically Twice daily  vit C/vit E acet/lutein/min (OCUVITE LUTEIN ORAL) Take 1 Tablet by mouth Once a day       ALLERGIES:  Allergy History as of 11/11/22        No Known Allergies                    FAMILY HISTORY:  Family Medical History:       Problem Relation (Age of Onset)    Cancer Mother    Heart Disease Father    Macular Degen Mother              SOCIAL HISTORY:  Social History     Tobacco Use    Smoking status: Former     Average packs/day: 1 pack/day for 20.0 years (20.0 ttl pk-yrs)     Types: Cigarettes     Start date: 1966    Smokeless tobacco: Never   Vaping Use    Vaping status: Never Used   Substance Use Topics    Alcohol use: Not Currently    Drug use: Not Currently     Types: Marijuana     Comment: medical marijuana card        Physical Exam:  BP 92/60 (Site: Right, Patient Position: Sitting)   Pulse 74   Temp 36.9 C (98.5 F) (Oral)   SpO2 97%         GENERAL: he is alert, cooperative, no distress, appears stated age.      Orthopedic Evaluation:  Patient not  ambulating today, in a wheelchair.  Evaluation of the Right knee shows a well-healing incision, no wound dehiscence. No increased erythema, edema, warmth, purulent drainage.  Normal postoperative swelling.  Calf is supple and nontender to palpation.  Patella tracks midline without instability.  Collateral ligament testing stable.  Range of motion not tested today..  Neurovascularly intact distally.    Assessment:  S/p Right TKA    Plan:  Patient will follow up as scheduled 11/23/2022.  Discussed proper wound care and management.  Keep the compression dressing on for 24 hours.  If he starts oozing through the new bandage, I did give him ABD pads to reinforce the dressing.  Follow up sooner if needed.    I am seeing this patient independently with co-signing supervising physician present in clinic.    Cristy Folks, PA-C      This note was partially generated using MModal Fluency Direct system, and there may be some incorrect words, spellings, and punctuation that were not noted in checking the note before saving

## 2022-11-11 NOTE — ED Nurses Note (Signed)
Patient discharged home with family.  AVS reviewed with patient/care giver.  A written copy of the AVS and discharge instructions was given to the patient/care giver. Scripts handed to patient/care giver. Questions sufficiently answered as needed.  Patient/care giver encouraged to follow up with PCP as indicated.  In the event of an emergency, patient/care giver instructed to call 911 or go to the nearest emergency room.

## 2022-11-15 ENCOUNTER — Ambulatory Visit (INDEPENDENT_AMBULATORY_CARE_PROVIDER_SITE_OTHER): Payer: Self-pay | Admitting: Rehabilitative and Restorative Service Providers"

## 2022-11-16 ENCOUNTER — Other Ambulatory Visit (INDEPENDENT_AMBULATORY_CARE_PROVIDER_SITE_OTHER): Payer: Self-pay | Admitting: Specialist

## 2022-11-16 MED ORDER — OXYCODONE-ACETAMINOPHEN 5 MG-325 MG TABLET
1.0000 | ORAL_TABLET | ORAL | 0 refills | Status: DC | PRN
Start: 2022-11-16 — End: 2022-11-23

## 2022-11-16 NOTE — Telephone Encounter (Signed)
Revision right TKA femoral and tibal components on 11/09/22 requesting refill on percocet #30 tablets.  Last fill was 11/09/22.    Thanks!    Denton Meek, MA

## 2022-11-17 ENCOUNTER — Ambulatory Visit (INDEPENDENT_AMBULATORY_CARE_PROVIDER_SITE_OTHER): Payer: Medicare Other | Admitting: Rehabilitative and Restorative Service Providers"

## 2022-11-23 ENCOUNTER — Ambulatory Visit (INDEPENDENT_AMBULATORY_CARE_PROVIDER_SITE_OTHER): Payer: Medicare Other | Admitting: Rehabilitative and Restorative Service Providers"

## 2022-11-23 ENCOUNTER — Telehealth (INDEPENDENT_AMBULATORY_CARE_PROVIDER_SITE_OTHER): Payer: Self-pay | Admitting: Specialist

## 2022-11-23 ENCOUNTER — Other Ambulatory Visit (INDEPENDENT_AMBULATORY_CARE_PROVIDER_SITE_OTHER): Payer: Self-pay | Admitting: Specialist

## 2022-11-23 DIAGNOSIS — Z96651 Presence of right artificial knee joint: Secondary | ICD-10-CM

## 2022-11-23 MED ORDER — OXYCODONE-ACETAMINOPHEN 5 MG-325 MG TABLET
1.0000 | ORAL_TABLET | ORAL | 0 refills | Status: DC | PRN
Start: 2022-11-23 — End: 2022-12-05

## 2022-11-23 NOTE — Telephone Encounter (Signed)
Revision right TKA of femoral and tibial components on 11/09/22.      Requesting refill on percocet #30 tablets.  Last fill was 11/16/22.    Denton Meek, MA

## 2022-11-23 NOTE — Progress Notes (Signed)
Patient cancelled his appointment and re-scheduled

## 2022-11-23 NOTE — Progress Notes (Deleted)
[facility]  [address]  [facility] [place] [address]    Progress Note    Name: BRAVIN HELMAN MRN:  [id number]   Date: 11/23/2022 DOB:  [birth year] (76 y.o.)     Reason for visit:  Post Operative Revision of Right TKA 11/09/2022     History of Present Illness:  BRENNDON MATHUS is a 77 y.o. male who presents for No chief complaint on file.  RAYKWON CREGAR has no reported new trauma.  No reported fevers or chills. Denies any issues with incision. Outpatient physical therapy has been initiated ***.     PAST MEDICAL HISTORY:  Past Medical History:   Diagnosis Date    Chronic pain     "lower back, right knee and arthritis"    DVT (deep venous thrombosis) (CMS HCC)     "left groin"    Essential hypertension     Neuropathy (CMS HCC)     Spinal cord compression (CMS HCC)     Wears glasses     readers           PAST SURGICAL HISTORY:  Past Surgical History:   Procedure Laterality Date    HX CERVICAL LAMINECTOMY  03/2021    C7-T1    HX CERVICAL SPINE SURGERY      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    LAMINECTOMY  03/2016    L4-5    REPLACEMENT TOTAL KNEE Right 03/2016           MEDICATIONS:  Current Outpatient Medications   Medication Sig    apixaban (ELIQUIS) 2.5 mg Oral Tablet Take 1 Tablet (2.5 mg total) by mouth Twice daily for 30 days    finasteride (PROSCAR) 5 mg Oral Tablet Take 1 Tablet (5 mg total) by mouth Once a day    gabapentin (NEURONTIN) 300 mg Oral Capsule Take 1 Capsule (300 mg total) by mouth Four times a day (Patient taking differently: Take 1 Capsule (300 mg total) by mouth Four times a day Takes 2 capsules bid)    lisinopriL (PRINIVIL) 40 mg Oral Tablet Take 1 Tablet (40 mg total) by mouth Once a day for 180 days    metoprolol tartrate (LOPRESSOR) 25 mg Oral Tablet Take 1 Tablet (25 mg total) by mouth Twice daily for 180 days    multivitamin with iron Oral Tablet Take 1 Tablet by mouth Once a day    ondansetron (ZOFRAN ODT) 4 mg Oral Tablet, Rapid Dissolve Take 1  Tablet (4 mg total) by mouth Every 8 hours as needed for Nausea/Vomiting    oxyCODONE-acetaminophen (PERCOCET) 5-325 mg Oral Tablet Take 1 Tablet by mouth Every 4 hours as needed for Pain 2 tabs by mouth for severe pain  Do not exceed 2 tabs every 4 hours    oxyCODONE-acetaminophen (PERCOCET) 5-325 mg Oral Tablet Take 1 Tablet by mouth Every 4 hours as needed for Pain for up to 8 days    triamcinolone acetonide (ARISTOCORT A) 0.1 % Ointment Apply topically Twice daily    vit C/vit E acet/lutein/min (OCUVITE LUTEIN ORAL) Take 1 Tablet by mouth Once a day       ALLERGIES:  Allergy History as of 11/23/22        No Known Allergies                    FAMILY HISTORY:  Family Medical History:       Problem Relation (Age of Onset)    Cancer Mother  Heart Disease Father    Macular Degen Mother              SOCIAL HISTORY:  Social History     Tobacco Use    Smoking status: Former     Average packs/day: 1 pack/day for 20.0 years (20.0 ttl pk-yrs)     Types: Cigarettes     Start date: 1966    Smokeless tobacco: Never   Vaping Use    Vaping status: Never Used   Substance Use Topics    Alcohol use: Not Currently    Drug use: Not Currently     Types: Marijuana     Comment: medical marijuana card        Physical Exam:  There were no vitals taken for this visit.        GENERAL: he is alert, cooperative, no distress, appears stated age.      Orthopedic Evaluation:  Patient displays an assisted gait with the use of  a {Walker/cane/Rollator walker/crutches:[id number]}. Evaluation of the Right knee shows a well-healing incision, no wound dehiscence. No increased erythema, edema, warmth, purulent drainage.  Normal postoperative swelling.  Calf is supple and nontender to palpation.  Patella tracks midline without instability.  Collateral ligament testing stable.  Range of motion ***.  Neurovascularly intact distally.    Assessment:  S/p Revision of Right TKA    Plan:  I have stressed the importance of progression of range of motion and  strengthening with physical therapy. Pain medications have been reviewed.  A scheduled follow up appointment has been set with Dr. Katherene Ponto.  X-rays will be performed at that office visit.  All questions answered completely.      I am seeing this patient independently with co-signing supervising physician present in clinic.    Cristy Folks, PA-C      This note was partially generated using MModal Fluency Direct system, and there may be some incorrect words, spellings, and punctuation that were not noted in checking the note before saving

## 2022-11-23 NOTE — Telephone Encounter (Signed)
Patient was scheduled today for post op and fell on the way here. REVISION RT TKA POSS HINGE 11/09/2022 BY DR.MCELROY  Patient had a PO appt 5/29 @ 9:45 he called 20 min before is appt and said he fell going to his car and he wants to cx his appt due to being to sore, I advised pat that he should still come to his appt, he said he's to bangged up and wanted to resch. resch to 6/5 @ 10:30am that was next available.     He called back to find out what to do next?     Should we bring him in Friday with Matt?

## 2022-11-23 NOTE — Telephone Encounter (Signed)
Please schedule with Matt on Friday.      Denton Meek, MA

## 2022-11-24 ENCOUNTER — Encounter (INDEPENDENT_AMBULATORY_CARE_PROVIDER_SITE_OTHER): Payer: Self-pay

## 2022-11-24 ENCOUNTER — Ambulatory Visit (INDEPENDENT_AMBULATORY_CARE_PROVIDER_SITE_OTHER): Payer: Self-pay | Admitting: Rehabilitative and Restorative Service Providers"

## 2022-11-25 ENCOUNTER — Other Ambulatory Visit: Payer: Self-pay

## 2022-11-25 ENCOUNTER — Encounter (INDEPENDENT_AMBULATORY_CARE_PROVIDER_SITE_OTHER): Payer: Medicare Other

## 2022-11-25 ENCOUNTER — Encounter (INDEPENDENT_AMBULATORY_CARE_PROVIDER_SITE_OTHER): Payer: Self-pay

## 2022-11-25 ENCOUNTER — Ambulatory Visit (INDEPENDENT_AMBULATORY_CARE_PROVIDER_SITE_OTHER): Payer: Medicare Other

## 2022-11-25 VITALS — BP 118/70 | HR 87 | Ht 69.0 in | Wt 201.0 lb

## 2022-11-25 DIAGNOSIS — Z471 Aftercare following joint replacement surgery: Secondary | ICD-10-CM

## 2022-11-25 DIAGNOSIS — Z96651 Presence of right artificial knee joint: Secondary | ICD-10-CM

## 2022-11-25 NOTE — Progress Notes (Signed)
[facility]  [facility]  [address]  [facility] [place] [address]   Progress Note    Patient: Gary White   MRN: [id number]  Date: 11/25/2022    Date of Injury/Surgery:  11/09/2022 revision right total knee arthroplasty      Subjective:   Gary White is a 77 y.o. male who presents for follow up of the above stated procedure.  He is 2 weeks out.  He was seen a couple of days after surgery for a wound check.  He is here now for his normal 2 week postop.  He says that he fell down in his bathroom on Wednesday in his has significant pain in his knee since.  He locates most of the pain to his medial knee.  He was bearing some weight with a walker prior to his fall but is now unable to bear any weight.  He is here with his son.  His son works.  Patient was having difficulty completing his activities of daily living as he is currently nonambulatory.  He is on Eliquis.      Review of Systems: Outside of those noted above all other systems were reviewed and are negative      Physical Exam:  BP 118/70   Pulse 87   Ht 1.753 m (5\' 9" )   Wt 91.2 kg (201 lb)   SpO2 91%   BMI 29.68 kg/m         This is a well appearing 77 y.o. male in no acute distress in the clinic today    Extremity exam:  Right lower extremity shows mild swelling over the medial knee.  Some ecchymosis noted just proximally.  Passive ranging of knee does not elicit significant pain but varus and valgus tests cause pain.  Knee feels grossly stable.  Neurovascularly intact distally.      Imaging:  X-rays obtained in clinic today show no fractures.  Hardware appears in place and does not appear to have shifted at all from postoperative films.    Assessment:   ENCOUNTER DIAGNOSES     ICD-10-CM   1. Status post revision of total replacement of right knee  Z96.651       Plan:  I do not think that the patient fractured himself or damaged his hardware with his fall.  I think he has a bad soft tissue injury.  I  think time and therapy will work this out.  We will order home health physical therapy as he was having difficulty leaving the house.  We will also order home health nursing to help him complete his activities of daily living.  We will plan on him returning in 1 month to see Dr. Katherene Ponto and get x-rays of his right knee.  He will return sooner if his pain does not improve from his recent fall.      Rickey Barbara, PA-C  11/25/2022, 09:46    I saw this patient independently with Dr. Katherene Ponto available by phone.

## 2022-11-28 ENCOUNTER — Telehealth (INDEPENDENT_AMBULATORY_CARE_PROVIDER_SITE_OTHER): Payer: Self-pay | Admitting: Rehabilitative and Restorative Service Providers"

## 2022-11-28 NOTE — Telephone Encounter (Signed)
I called and spoke with Toniann Fail and advised her this is for both skilled nursing and PT. Toniann Fail voiced understanding.     Rosetta Posner, CMA

## 2022-11-28 NOTE — Telephone Encounter (Signed)
Gary White from [facility] called they received order for him they need to know if it is for Skilled nursing or PT   or do you want both.  Can you please call her at [phone]

## 2022-11-30 ENCOUNTER — Encounter (INDEPENDENT_AMBULATORY_CARE_PROVIDER_SITE_OTHER): Payer: Medicare Other | Admitting: Rehabilitative and Restorative Service Providers"

## 2022-12-02 ENCOUNTER — Encounter (INDEPENDENT_AMBULATORY_CARE_PROVIDER_SITE_OTHER): Payer: Self-pay | Admitting: Specialist

## 2022-12-05 ENCOUNTER — Encounter (INDEPENDENT_AMBULATORY_CARE_PROVIDER_SITE_OTHER): Payer: Self-pay | Admitting: Specialist

## 2022-12-05 ENCOUNTER — Other Ambulatory Visit (INDEPENDENT_AMBULATORY_CARE_PROVIDER_SITE_OTHER): Payer: Self-pay | Admitting: Specialist

## 2022-12-05 MED ORDER — CYCLOBENZAPRINE 10 MG TABLET
10.0000 mg | ORAL_TABLET | Freq: Three times a day (TID) | ORAL | 0 refills | Status: DC
Start: 2022-12-05 — End: 2023-01-03

## 2022-12-05 NOTE — Telephone Encounter (Signed)
flex 

## 2022-12-05 NOTE — Telephone Encounter (Signed)
11/09/2022 revision right total knee arthroplasty by Dr. Katherene Ponto. Last fill 11/23/22.    Edwin Cap, MA

## 2022-12-06 MED ORDER — OXYCODONE-ACETAMINOPHEN 5 MG-325 MG TABLET
1.0000 | ORAL_TABLET | ORAL | 0 refills | Status: DC | PRN
Start: 2022-12-06 — End: 2023-01-03

## 2022-12-12 ENCOUNTER — Ambulatory Visit (INDEPENDENT_AMBULATORY_CARE_PROVIDER_SITE_OTHER): Payer: Medicare Other | Admitting: Rehabilitative and Restorative Service Providers"

## 2022-12-12 ENCOUNTER — Encounter (INDEPENDENT_AMBULATORY_CARE_PROVIDER_SITE_OTHER): Payer: Medicare Other

## 2022-12-12 ENCOUNTER — Other Ambulatory Visit: Payer: Self-pay

## 2022-12-12 VITALS — BP 118/74 | Wt 201.0 lb

## 2022-12-12 DIAGNOSIS — Z96651 Presence of right artificial knee joint: Secondary | ICD-10-CM

## 2022-12-12 DIAGNOSIS — Z471 Aftercare following joint replacement surgery: Secondary | ICD-10-CM

## 2022-12-12 NOTE — Procedures (Signed)
[facility]  [address]  [facility] [place] [address]    Procedure Note    Name: Gary White MRN:  [id number]   Date: 12/12/2022 DOB:  04/21/1946 (76 y.o.)         XR KNEE STANDING RIGHT (AMB ONLY)    Performed by: Cristy Folks, PA-C  Authorized by: Cristy Folks, PA-C    Documentation:      AP and lateral weight-bearing x-ray of the right knee shows a cemented total knee arthroplasty that is in good position, sized appropriately for the patient.  No radiographic evidence of loosening or hardware failure.      Cristy Folks, PA-C

## 2022-12-12 NOTE — Progress Notes (Signed)
[facility]  [address]  [facility] [place] [address]    Progress Note    Name: Gary White MRN:  [id number]   Date: 12/12/2022 DOB:  07-15-1945 (77 y.o.)     Reason for visit:  Post Operative Revision Right TKA 11/09/2022     History of Present Illness:  Gary White is a 77 y.o. male who presents for Post Op (Revision RT TKA poss hinge 11/09/22/Had a fall 11/22/22 /In PT, can't put full weight yet)  Cherylann Ratel has no reported new trauma.  No reported fevers or chills. Denies any issues with incision. Outpatient physical therapy has been initiated [facility].     PAST MEDICAL HISTORY:  Past Medical History:   Diagnosis Date    Chronic pain     "lower back, right knee and arthritis"    DVT (deep venous thrombosis) (CMS HCC)     "left groin"    Essential hypertension     Neuropathy (CMS HCC)     Spinal cord compression (CMS HCC)     Wears glasses     readers           PAST SURGICAL HISTORY:  Past Surgical History:   Procedure Laterality Date    HX CERVICAL LAMINECTOMY  03/2021    C7-T1    HX CERVICAL SPINE SURGERY      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    KNEE SURGERY Right 11/09/2022    Dr. Katherene Ponto @ [facility]    LAMINECTOMY  03/2016    L4-5    REPLACEMENT TOTAL KNEE Right 03/2016           MEDICATIONS:  Current Outpatient Medications   Medication Sig    cyclobenzaprine (FLEXERIL) 10 mg Oral Tablet Take 1 Tablet (10 mg total) by mouth Three times a day    finasteride (PROSCAR) 5 mg Oral Tablet Take 1 Tablet (5 mg total) by mouth Once a day    gabapentin (NEURONTIN) 300 mg Oral Capsule Take 1 Capsule (300 mg total) by mouth Four times a day (Patient taking differently: Take 1 Capsule (300 mg total) by mouth Four times a day Takes 2 capsules bid)    lisinopriL (PRINIVIL) 40 mg Oral Tablet Take 1 Tablet (40 mg total) by mouth Once a day for 180 days    metoprolol tartrate (LOPRESSOR) 25 mg Oral Tablet Take 1 Tablet (25 mg total) by mouth Twice daily for 180 days     multivitamin with iron Oral Tablet Take 1 Tablet by mouth Once a day    ondansetron (ZOFRAN ODT) 4 mg Oral Tablet, Rapid Dissolve Take 1 Tablet (4 mg total) by mouth Every 8 hours as needed for Nausea/Vomiting    oxyCODONE-acetaminophen (PERCOCET) 5-325 mg Oral Tablet Take 1 Tablet by mouth Every 4 hours as needed for Pain 2 tabs by mouth for severe pain  Do not exceed 2 tabs every 4 hours    oxyCODONE-acetaminophen (PERCOCET) 5-325 mg Oral Tablet Take 1 Tablet by mouth Every 4 hours as needed for Pain for up to 8 days    triamcinolone acetonide (ARISTOCORT A) 0.1 % Ointment Apply topically Twice daily    vit C/vit E acet/lutein/min (OCUVITE LUTEIN ORAL) Take 1 Tablet by mouth Once a day       ALLERGIES:  Allergy History as of 12/12/22        No Known Allergies  FAMILY HISTORY:  Family Medical History:       Problem Relation (Age of Onset)    Cancer Mother    Heart Disease Father    Macular Degen Mother              SOCIAL HISTORY:  Social History     Tobacco Use    Smoking status: Former     Average packs/day: 1 pack/day for 20.0 years (20.0 ttl pk-yrs)     Types: Cigarettes     Start date: 1966    Smokeless tobacco: Never   Vaping Use    Vaping status: Never Used   Substance Use Topics    Alcohol use: Not Currently    Drug use: Not Currently     Types: Marijuana     Comment: medical marijuana card        Physical Exam:  BP 118/74 (Site: Right)   Wt 91.2 kg (201 lb)   SpO2 98%   BMI 29.68 kg/m         GENERAL: he is alert, cooperative, no distress, appears stated age.      Orthopedic Evaluation:  Patient is still nonweightbearing to the right lower extremity due to pain.  He is in a wheelchair today.. Evaluation of the Right knee shows a well-healing incision, no wound dehiscence. No increased erythema, edema, warmth, purulent drainage.  Normal postoperative swelling.  Calf is supple and nontender to palpation.  Patella tracks midline without instability.  Collateral ligament testing stable.   Range of motion 17 to 100.  Neurovascularly intact distally.    AP and lateral weight-bearing x-ray of the right knee shows a cemented total knee arthroplasty that is in good position, sized appropriately for the patient.  No radiographic evidence of loosening or hardware failure.    Assessment:  S/p Revision Right TKA    Plan:  I have stressed the importance of progression of range of motion and strengthening with physical therapy. Pain medications have been reviewed.  A scheduled follow up appointment has been set with Dr. Katherene Ponto.  X-rays will be performed at that office visit.  Discussed with the patient that he would benefit from admission to [facility] for inpatient rehab to work on aggressive range of motion.  We will work on referral today.  All questions answered completely.      I am seeing this patient independently with co-signing supervising physician present in clinic.    Cristy Folks, PA-C      This note was partially generated using MModal Fluency Direct system, and there may be some incorrect words, spellings, and punctuation that were not noted in checking the note before saving

## 2022-12-13 ENCOUNTER — Telehealth (INDEPENDENT_AMBULATORY_CARE_PROVIDER_SITE_OTHER): Payer: Self-pay | Admitting: Student in an Organized Health Care Education/Training Program

## 2022-12-13 ENCOUNTER — Other Ambulatory Visit (HOSPITAL_COMMUNITY)
Admit: 2022-12-13 | Discharge: 2022-12-13 | Disposition: A | Discharge status: Home / Self Care | Payer: Self-pay | Admitting: Family Medicine

## 2022-12-13 NOTE — Telephone Encounter (Signed)
FYI

## 2022-12-13 NOTE — Telephone Encounter (Signed)
To notify PCP pt to be admitted to [facility] today.

## 2022-12-14 ENCOUNTER — Telehealth (INDEPENDENT_AMBULATORY_CARE_PROVIDER_SITE_OTHER): Payer: Self-pay | Admitting: Student in an Organized Health Care Education/Training Program

## 2022-12-14 ENCOUNTER — Other Ambulatory Visit (HOSPITAL_COMMUNITY): Payer: Self-pay | Admitting: Family Medicine

## 2022-12-14 DIAGNOSIS — S14139A Anterior cord syndrome at unspecified level of cervical spinal cord, initial encounter: Secondary | ICD-10-CM

## 2022-12-14 DIAGNOSIS — D649 Anemia, unspecified: Secondary | ICD-10-CM

## 2022-12-14 DIAGNOSIS — G609 Hereditary and idiopathic neuropathy, unspecified: Secondary | ICD-10-CM

## 2022-12-14 DIAGNOSIS — M238X1 Other internal derangements of right knee: Secondary | ICD-10-CM

## 2022-12-14 DIAGNOSIS — Z66 Do not resuscitate: Secondary | ICD-10-CM

## 2022-12-14 DIAGNOSIS — I10 Essential (primary) hypertension: Secondary | ICD-10-CM

## 2022-12-14 DIAGNOSIS — M1711 Unilateral primary osteoarthritis, right knee: Secondary | ICD-10-CM

## 2022-12-14 DIAGNOSIS — Z7409 Other reduced mobility: Secondary | ICD-10-CM

## 2022-12-14 LAB — CBC/DIFF - CLIENT CONSOLIDATED
BASOPHIL #: <0.1 10*3/uL (ref <=0.20)
BASOPHIL %: 0.5 %
EOSINOPHIL #: 0.29 10*3/uL (ref <=0.50)
EOSINOPHIL %: 6.7 %
HCT: 35.7 % — ABNORMAL LOW (ref 38.9–52.0)
HGB: 12 g/dL — ABNORMAL LOW (ref 13.4–17.5)
IMMATURE GRANULOCYTE #: <0.1 10*3/uL (ref <0.10)
IMMATURE GRANULOCYTE %: 0.2 % (ref 0.0–1.0)
LYMPHOCYTE #: 1.04 10*3/uL (ref 1.00–4.80)
LYMPHOCYTE %: 24.1 %
MCH: 30.9 pg (ref 26.0–32.0)
MCHC: 33.6 g/dL (ref 31.0–35.5)
MCV: 92 fL (ref 78.0–100.0)
MONOCYTE #: 0.39 10*3/uL (ref 0.20–1.10)
MONOCYTE %: 9 %
MPV: 9.4 fL (ref 8.7–12.5)
NEUTROPHIL #: 2.56 10*3/uL (ref 1.50–7.70)
NEUTROPHIL %: 59.5 %
PLATELETS: 118 10*3/uL — ABNORMAL LOW (ref 150–400)
RBC: 3.88 10*6/uL — ABNORMAL LOW (ref 4.50–6.10)
RDW-CV: 14.1 % (ref 11.5–15.5)
WBC: 4.3 10*3/uL (ref 3.7–11.0)

## 2022-12-14 LAB — CBC WITH DIFF
BASOPHIL #: <0.1 10*3/uL (ref <=0.20)
BASOPHIL %: 0.5 %
EOSINOPHIL #: 0.29 10*3/uL (ref <=0.50)
EOSINOPHIL %: 6.7 %
HCT: 35.7 % — ABNORMAL LOW (ref 38.9–52.0)
HGB: 12 g/dL — ABNORMAL LOW (ref 13.4–17.5)
IMMATURE GRANULOCYTE #: <0.1 10*3/uL (ref <0.10)
IMMATURE GRANULOCYTE %: 0.2 % (ref 0.0–1.0)
LYMPHOCYTE #: 1.04 10*3/uL (ref 1.00–4.80)
LYMPHOCYTE %: 24.1 %
MCH: 30.9 pg (ref 26.0–32.0)
MCHC: 33.6 g/dL (ref 31.0–35.5)
MCV: 92 fL (ref 78.0–100.0)
MONOCYTE #: 0.39 10*3/uL (ref 0.20–1.10)
MONOCYTE %: 9 %
MPV: 9.4 fL (ref 8.7–12.5)
NEUTROPHIL #: 2.56 10*3/uL (ref 1.50–7.70)
NEUTROPHIL %: 59.5 %
PLATELETS: 118 10*3/uL — ABNORMAL LOW (ref 150–400)
RBC: 3.88 10*6/uL — ABNORMAL LOW (ref 4.50–6.10)
RDW-CV: 14.1 % (ref 11.5–15.5)
WBC: 4.3 10*3/uL (ref 3.7–11.0)

## 2022-12-14 LAB — COMPREHENSIVE METABOLIC PANEL, NON-FASTING
ALBUMIN: 3.6 g/dL (ref 3.4–4.8)
ALKALINE PHOSPHATASE: 157 U/L — ABNORMAL HIGH (ref 45–115)
ALT (SGPT): 12 U/L (ref 10–55)
ANION GAP: 4 mmol/L (ref 4–13)
AST (SGOT): 16 U/L (ref 8–45)
BILIRUBIN TOTAL: 2.6 mg/dL — ABNORMAL HIGH (ref 0.3–1.3)
BUN/CREA RATIO: 29 — ABNORMAL HIGH (ref 6–22)
BUN: 20 mg/dL (ref 8–25)
CALCIUM: 8.8 mg/dL (ref 8.6–10.3)
CHLORIDE: 107 mmol/L (ref 96–111)
CO2 TOTAL: 25 mmol/L (ref 23–31)
CREATININE: 0.7 mg/dL — ABNORMAL LOW (ref 0.75–1.35)
ESTIMATED GFR - MALE: >90 mL/min/BSA (ref >=60)
GLUCOSE: 73 mg/dL (ref 65–125)
POTASSIUM: 3.6 mmol/L (ref 3.5–5.1)
PROTEIN TOTAL: 5.4 g/dL — ABNORMAL LOW (ref 6.0–8.0)
SODIUM: 136 mmol/L (ref 136–145)

## 2022-12-14 NOTE — Telephone Encounter (Signed)
See telephone encounter.

## 2022-12-14 NOTE — Telephone Encounter (Signed)
-----   Message from Wilmon Pali, MD sent at 12/14/2022  9:03 AM EDT -----  Anemia still present  In 3 months, let's recheck, with reticulocyte count, iron panel, vitamin B12, folate

## 2022-12-14 NOTE — Result Encounter Note (Signed)
Anemia still present  In 3 months, let's recheck, with reticulocyte count, iron panel, vitamin B12, folate

## 2022-12-14 NOTE — Telephone Encounter (Signed)
Lab results and recommendations given per documentation, v/u

## 2022-12-14 NOTE — Telephone Encounter (Signed)
Buckner Malta, [place] minutes ago (3:28 PM)     Lab results and recommendations given per documentation, v /u

## 2022-12-14 NOTE — Telephone Encounter (Signed)
Entered future orders

## 2022-12-16 ENCOUNTER — Other Ambulatory Visit (HOSPITAL_COMMUNITY): Payer: Self-pay | Admitting: Family Medicine

## 2022-12-16 LAB — CBC WITH DIFF
BASOPHIL #: <0.1 10*3/uL (ref <=0.20)
BASOPHIL %: 0.6 %
EOSINOPHIL #: 0.34 10*3/uL (ref <=0.50)
EOSINOPHIL %: 7.2 %
HCT: 35.7 % — ABNORMAL LOW (ref 38.9–52.0)
HGB: 12 g/dL — ABNORMAL LOW (ref 13.4–17.5)
IMMATURE GRANULOCYTE #: <0.1 10*3/uL (ref <0.10)
IMMATURE GRANULOCYTE %: 0.2 % (ref 0.0–1.0)
LYMPHOCYTE #: 1.04 10*3/uL (ref 1.00–4.80)
LYMPHOCYTE %: 21.9 %
MCH: 31.2 pg (ref 26.0–32.0)
MCHC: 33.6 g/dL (ref 31.0–35.5)
MCV: 92.7 fL (ref 78.0–100.0)
MONOCYTE #: 0.35 10*3/uL (ref 0.20–1.10)
MONOCYTE %: 7.4 %
MPV: 9.2 fL (ref 8.7–12.5)
NEUTROPHIL #: 2.98 10*3/uL (ref 1.50–7.70)
NEUTROPHIL %: 62.7 %
PLATELETS: 126 10*3/uL — ABNORMAL LOW (ref 150–400)
RBC: 3.85 10*6/uL — ABNORMAL LOW (ref 4.50–6.10)
RDW-CV: 14 % (ref 11.5–15.5)
WBC: 4.8 10*3/uL (ref 3.7–11.0)

## 2022-12-16 LAB — BASIC METABOLIC PANEL
ANION GAP: 2 mmol/L — ABNORMAL LOW (ref 4–13)
BUN/CREA RATIO: 22 (ref 6–22)
BUN: 14 mg/dL (ref 8–25)
CALCIUM: 9.1 mg/dL (ref 8.6–10.3)
CHLORIDE: 109 mmol/L (ref 96–111)
CO2 TOTAL: 27 mmol/L (ref 23–31)
CREATININE: 0.65 mg/dL — ABNORMAL LOW (ref 0.75–1.35)
ESTIMATED GFR - MALE: >90 mL/min/BSA (ref >=60)
GLUCOSE: 91 mg/dL (ref 65–125)
POTASSIUM: 4.1 mmol/L (ref 3.5–5.1)
SODIUM: 138 mmol/L (ref 136–145)

## 2022-12-16 LAB — CBC/DIFF - CLIENT CONSOLIDATED
BASOPHIL #: <0.1 10*3/uL (ref <=0.20)
BASOPHIL %: 0.6 %
EOSINOPHIL #: 0.34 10*3/uL (ref <=0.50)
EOSINOPHIL %: 7.2 %
HCT: 35.7 % — ABNORMAL LOW (ref 38.9–52.0)
HGB: 12 g/dL — ABNORMAL LOW (ref 13.4–17.5)
IMMATURE GRANULOCYTE #: <0.1 10*3/uL (ref <0.10)
IMMATURE GRANULOCYTE %: 0.2 % (ref 0.0–1.0)
LYMPHOCYTE #: 1.04 10*3/uL (ref 1.00–4.80)
LYMPHOCYTE %: 21.9 %
MCH: 31.2 pg (ref 26.0–32.0)
MCHC: 33.6 g/dL (ref 31.0–35.5)
MCV: 92.7 fL (ref 78.0–100.0)
MONOCYTE #: 0.35 10*3/uL (ref 0.20–1.10)
MONOCYTE %: 7.4 %
MPV: 9.2 fL (ref 8.7–12.5)
NEUTROPHIL #: 2.98 10*3/uL (ref 1.50–7.70)
NEUTROPHIL %: 62.7 %
PLATELETS: 126 10*3/uL — ABNORMAL LOW (ref 150–400)
RBC: 3.85 10*6/uL — ABNORMAL LOW (ref 4.50–6.10)
RDW-CV: 14 % (ref 11.5–15.5)
WBC: 4.8 10*3/uL (ref 3.7–11.0)

## 2022-12-16 NOTE — Result Encounter Note (Signed)
As previosuly annotated:   ----- Message from Wilmon Pali, MD sent at 12/14/2022  9:03 AM EDT -----  Anemia still present  In 3 months, let's recheck, with reticulocyte count, iron panel, vitamin B12, folate

## 2022-12-20 ENCOUNTER — Other Ambulatory Visit (HOSPITAL_COMMUNITY): Payer: Self-pay | Admitting: Family Medicine

## 2022-12-20 ENCOUNTER — Telehealth (INDEPENDENT_AMBULATORY_CARE_PROVIDER_SITE_OTHER): Payer: Self-pay | Admitting: Student in an Organized Health Care Education/Training Program

## 2022-12-20 LAB — CBC WITH DIFF
BASOPHIL #: <0.1 10*3/uL (ref <=0.20)
BASOPHIL %: 0.6 %
EOSINOPHIL #: 0.38 10*3/uL (ref <=0.50)
EOSINOPHIL %: 7.9 %
HCT: 36.2 % — ABNORMAL LOW (ref 38.9–52.0)
HGB: 12.4 g/dL — ABNORMAL LOW (ref 13.4–17.5)
IMMATURE GRANULOCYTE #: <0.1 10*3/uL (ref <0.10)
IMMATURE GRANULOCYTE %: 0.2 % (ref 0.0–1.0)
LYMPHOCYTE #: 0.95 10*3/uL — ABNORMAL LOW (ref 1.00–4.80)
LYMPHOCYTE %: 19.8 %
MCH: 31.1 pg (ref 26.0–32.0)
MCHC: 34.3 g/dL (ref 31.0–35.5)
MCV: 90.7 fL (ref 78.0–100.0)
MONOCYTE #: 0.36 10*3/uL (ref 0.20–1.10)
MONOCYTE %: 7.5 %
MPV: 9.4 fL (ref 8.7–12.5)
NEUTROPHIL #: 3.06 10*3/uL (ref 1.50–7.70)
NEUTROPHIL %: 64 %
PLATELETS: 133 10*3/uL — ABNORMAL LOW (ref 150–400)
RBC: 3.99 10*6/uL — ABNORMAL LOW (ref 4.50–6.10)
RDW-CV: 13.7 % (ref 11.5–15.5)
WBC: 4.8 10*3/uL (ref 3.7–11.0)

## 2022-12-20 LAB — BASIC METABOLIC PANEL
ANION GAP: 9 mmol/L (ref 4–13)
BUN/CREA RATIO: 24 — ABNORMAL HIGH (ref 6–22)
BUN: 16 mg/dL (ref 8–25)
CALCIUM: 9.1 mg/dL (ref 8.6–10.3)
CHLORIDE: 109 mmol/L (ref 96–111)
CO2 TOTAL: 21 mmol/L — ABNORMAL LOW (ref 23–31)
CREATININE: 0.67 mg/dL — ABNORMAL LOW (ref 0.75–1.35)
ESTIMATED GFR - MALE: >90 mL/min/BSA (ref >=60)
GLUCOSE: 89 mg/dL (ref 65–125)
POTASSIUM: 4.1 mmol/L (ref 3.5–5.1)
SODIUM: 139 mmol/L (ref 136–145)

## 2022-12-20 LAB — CBC/DIFF - CLIENT CONSOLIDATED
BASOPHIL #: <0.1 10*3/uL (ref <=0.20)
BASOPHIL %: 0.6 %
EOSINOPHIL #: 0.38 10*3/uL (ref <=0.50)
EOSINOPHIL %: 7.9 %
HCT: 36.2 % — ABNORMAL LOW (ref 38.9–52.0)
HGB: 12.4 g/dL — ABNORMAL LOW (ref 13.4–17.5)
IMMATURE GRANULOCYTE #: <0.1 10*3/uL (ref <0.10)
IMMATURE GRANULOCYTE %: 0.2 % (ref 0.0–1.0)
LYMPHOCYTE #: 0.95 10*3/uL — ABNORMAL LOW (ref 1.00–4.80)
LYMPHOCYTE %: 19.8 %
MCH: 31.1 pg (ref 26.0–32.0)
MCHC: 34.3 g/dL (ref 31.0–35.5)
MCV: 90.7 fL (ref 78.0–100.0)
MONOCYTE #: 0.36 10*3/uL (ref 0.20–1.10)
MONOCYTE %: 7.5 %
MPV: 9.4 fL (ref 8.7–12.5)
NEUTROPHIL #: 3.06 10*3/uL (ref 1.50–7.70)
NEUTROPHIL %: 64 %
PLATELETS: 133 10*3/uL — ABNORMAL LOW (ref 150–400)
RBC: 3.99 10*6/uL — ABNORMAL LOW (ref 4.50–6.10)
RDW-CV: 13.7 % (ref 11.5–15.5)
WBC: 4.8 10*3/uL (ref 3.7–11.0)

## 2022-12-20 NOTE — Telephone Encounter (Signed)
TOC smart form needs completed    Patient is being discharged from [facility]   Follow up with Karoline Caldwell 6-27@1pm       Medical records have been requested from the discharging facility.       [place] Plan

## 2022-12-21 NOTE — Telephone Encounter (Signed)
First attempt made  LVM for patient to return call.

## 2022-12-22 NOTE — Telephone Encounter (Signed)
Transition of Care Contact Information  Discharge Date: 12/20/2022  Transition Facility Type--Skilled Nursing Facility  Facility Name--[facility]  Interactive Contact(s): Completed or attempted contact indicated by Date/Time  First Attempt Call: 12/21/2022  2:38 PM  Second Attempted Contact: 12/22/2022  1:46 PM  Contact Method(s)-- Patient/Caregiver Telephone  Clinical Staff Name/Role who Vernona Rieger, CMA  Transition Note:Unable to reach patient.     Unable to Complete TCM due to:  Further information may be documented in relevant telephone or outreach encounter.

## 2022-12-22 NOTE — Telephone Encounter (Signed)
Second attempt made. LVM for patient to return my call

## 2022-12-23 ENCOUNTER — Other Ambulatory Visit (HOSPITAL_COMMUNITY): Payer: Self-pay | Admitting: Family Medicine

## 2022-12-23 ENCOUNTER — Encounter (INDEPENDENT_AMBULATORY_CARE_PROVIDER_SITE_OTHER): Payer: Self-pay | Admitting: Student in an Organized Health Care Education/Training Program

## 2022-12-23 LAB — CBC WITH DIFF
BASOPHIL #: <0.1 10*3/uL (ref <=0.20)
BASOPHIL %: 0.7 %
EOSINOPHIL #: 0.39 10*3/uL (ref <=0.50)
EOSINOPHIL %: 7.3 %
HCT: 37.2 % — ABNORMAL LOW (ref 38.9–52.0)
HGB: 12.7 g/dL — ABNORMAL LOW (ref 13.4–17.5)
IMMATURE GRANULOCYTE #: <0.1 10*3/uL (ref <0.10)
IMMATURE GRANULOCYTE %: 0.4 % (ref 0.0–1.0)
LYMPHOCYTE #: 1.11 10*3/uL (ref 1.00–4.80)
LYMPHOCYTE %: 20.7 %
MCH: 30.9 pg (ref 26.0–32.0)
MCHC: 34.1 g/dL (ref 31.0–35.5)
MCV: 90.5 fL (ref 78.0–100.0)
MONOCYTE #: 0.44 10*3/uL (ref 0.20–1.10)
MONOCYTE %: 8.2 %
MPV: 9.3 fL (ref 8.7–12.5)
NEUTROPHIL #: 3.36 10*3/uL (ref 1.50–7.70)
NEUTROPHIL %: 62.7 %
PLATELETS: 131 10*3/uL — ABNORMAL LOW (ref 150–400)
RBC: 4.11 10*6/uL — ABNORMAL LOW (ref 4.50–6.10)
RDW-CV: 13.7 % (ref 11.5–15.5)
WBC: 5.4 10*3/uL (ref 3.7–11.0)

## 2022-12-23 LAB — CBC/DIFF - CLIENT CONSOLIDATED
BASOPHIL #: <0.1 10*3/uL (ref <=0.20)
BASOPHIL %: 0.7 %
EOSINOPHIL #: 0.39 10*3/uL (ref <=0.50)
EOSINOPHIL %: 7.3 %
HCT: 37.2 % — ABNORMAL LOW (ref 38.9–52.0)
HGB: 12.7 g/dL — ABNORMAL LOW (ref 13.4–17.5)
IMMATURE GRANULOCYTE #: <0.1 10*3/uL (ref <0.10)
IMMATURE GRANULOCYTE %: 0.4 % (ref 0.0–1.0)
LYMPHOCYTE #: 1.11 10*3/uL (ref 1.00–4.80)
LYMPHOCYTE %: 20.7 %
MCH: 30.9 pg (ref 26.0–32.0)
MCHC: 34.1 g/dL (ref 31.0–35.5)
MCV: 90.5 fL (ref 78.0–100.0)
MONOCYTE #: 0.44 10*3/uL (ref 0.20–1.10)
MONOCYTE %: 8.2 %
MPV: 9.3 fL (ref 8.7–12.5)
NEUTROPHIL #: 3.36 10*3/uL (ref 1.50–7.70)
NEUTROPHIL %: 62.7 %
PLATELETS: 131 10*3/uL — ABNORMAL LOW (ref 150–400)
RBC: 4.11 10*6/uL — ABNORMAL LOW (ref 4.50–6.10)
RDW-CV: 13.7 % (ref 11.5–15.5)
WBC: 5.4 10*3/uL (ref 3.7–11.0)

## 2022-12-23 LAB — BASIC METABOLIC PANEL
ANION GAP: 5 mmol/L (ref 4–13)
BUN/CREA RATIO: 24 — ABNORMAL HIGH (ref 6–22)
BUN: 16 mg/dL (ref 8–25)
CALCIUM: 9.2 mg/dL (ref 8.6–10.3)
CHLORIDE: 109 mmol/L (ref 96–111)
CO2 TOTAL: 24 mmol/L (ref 23–31)
CREATININE: 0.67 mg/dL — ABNORMAL LOW (ref 0.75–1.35)
ESTIMATED GFR - MALE: >90 mL/min/BSA (ref >=60)
GLUCOSE: 87 mg/dL (ref 65–125)
POTASSIUM: 4.2 mmol/L (ref 3.5–5.1)
SODIUM: 138 mmol/L (ref 136–145)

## 2022-12-23 NOTE — Result Encounter Note (Signed)
Hgb close to normal will continue to monitor next visit

## 2022-12-26 DIAGNOSIS — Z96652 Presence of left artificial knee joint: Secondary | ICD-10-CM

## 2022-12-26 DIAGNOSIS — I10 Essential (primary) hypertension: Secondary | ICD-10-CM

## 2022-12-26 DIAGNOSIS — G959 Disease of spinal cord, unspecified: Secondary | ICD-10-CM

## 2022-12-26 DIAGNOSIS — G629 Polyneuropathy, unspecified: Secondary | ICD-10-CM

## 2022-12-26 DIAGNOSIS — Z87891 Personal history of nicotine dependence: Secondary | ICD-10-CM

## 2022-12-26 DIAGNOSIS — T84022A Instability of internal right knee prosthesis, initial encounter: Secondary | ICD-10-CM

## 2022-12-30 ENCOUNTER — Encounter (INDEPENDENT_AMBULATORY_CARE_PROVIDER_SITE_OTHER): Payer: Self-pay

## 2022-12-30 ENCOUNTER — Other Ambulatory Visit: Payer: Self-pay

## 2022-12-30 ENCOUNTER — Other Ambulatory Visit (INDEPENDENT_AMBULATORY_CARE_PROVIDER_SITE_OTHER): Payer: Medicare Other

## 2022-12-30 ENCOUNTER — Ambulatory Visit: Payer: Medicare Other

## 2022-12-30 VITALS — BP 108/68 | HR 85 | Temp 97.0°F | Resp 18 | Ht 69.0 in | Wt 191.8 lb

## 2022-12-30 DIAGNOSIS — D649 Anemia, unspecified: Secondary | ICD-10-CM | POA: Insufficient documentation

## 2022-12-30 DIAGNOSIS — Z09 Encounter for follow-up examination after completed treatment for conditions other than malignant neoplasm: Secondary | ICD-10-CM

## 2022-12-30 DIAGNOSIS — D696 Thrombocytopenia, unspecified: Secondary | ICD-10-CM

## 2022-12-30 DIAGNOSIS — Z96651 Presence of right artificial knee joint: Secondary | ICD-10-CM | POA: Insufficient documentation

## 2022-12-30 DIAGNOSIS — G959 Disease of spinal cord, unspecified: Secondary | ICD-10-CM | POA: Insufficient documentation

## 2022-12-30 LAB — CBC WITH DIFF
BASOPHIL #: <0.1 10*3/uL (ref <=0.20)
BASOPHIL %: 0.6 %
EOSINOPHIL #: 0.32 10*3/uL (ref <=0.50)
EOSINOPHIL %: 5.1 %
HCT: 40.4 % (ref 38.9–52.0)
HGB: 13.3 g/dL — ABNORMAL LOW (ref 13.4–17.5)
IMMATURE GRANULOCYTE #: <0.1 10*3/uL (ref <0.10)
IMMATURE GRANULOCYTE %: 0.2 % (ref 0.0–1.0)
LYMPHOCYTE #: 0.88 10*3/uL — ABNORMAL LOW (ref 1.00–4.80)
LYMPHOCYTE %: 13.9 %
MCH: 29.6 pg (ref 26.0–32.0)
MCHC: 32.9 g/dL (ref 31.0–35.5)
MCV: 90 fL (ref 78.0–100.0)
MONOCYTE #: 0.37 10*3/uL (ref 0.20–1.10)
MONOCYTE %: 5.9 %
MPV: 9.4 fL (ref 8.7–12.5)
NEUTROPHIL #: 4.7 10*3/uL (ref 1.50–7.70)
NEUTROPHIL %: 74.3 %
PLATELETS: 142 10*3/uL — ABNORMAL LOW (ref 150–400)
RBC: 4.49 10*6/uL — ABNORMAL LOW (ref 4.50–6.10)
RDW-CV: 13.4 % (ref 11.5–15.5)
WBC: 6.3 10*3/uL (ref 3.7–11.0)

## 2022-12-30 NOTE — Progress Notes (Signed)
Transition of Care Contact Information  Discharge date: Discharge Date: 12/20/2022  Transition Facility Type--Skilled Nursing Facility  Facility Name--[facility] Interactive Contact(s):  First Attempt Call: 12/21/2022  2:38 PM  Second Attempted Contact: 12/22/2022  1:46 PM  Contact Method(s)-- Patient/Caregiver Telephone  Clinical Staff Name/Role who Gary White, CMA     Data Reviewed  Medication Reconciliation completed      [facility]  [address] [place]  [place] [address]  Operated by [facility]  Progress Note    Name: Gary White MRN:  [id number]   Date: 12/30/2022 DOB:  1945-09-11 (77 y.o.)       Encounter Date: 12/30/2022   PCP: Wilmon Pali, MD       Reason for Visit: Follow Up (Pt was discharged from [facility] for rehabiliton of his right knee. Pt states he is doing better, but still does not have full function of his knee. Pt is in physical therapy.) and Hospital Discharge Transition    History of Present Illness:  Gary White is a 77 y.o. male who is being seen today for follow up from [facility].  Patient was admitted to [facility] 11/09/22-11/10/22 for scheduled right total knee replacement with Dr. Katherene Ponto.  Per EMR, interoperative period was .  was without complication and patient was discharged home.  He then reported to the ER via EMS later that evening after walking around on his knee and noticed blood coming from his surgical site.  Ortho was consulted who recommended pressure dressing with SurgiSeal and advised patient to follow up the following day outpatient.  Hemoglobin and hematocrit were stable at that time and patient was discharged home.  Patient was seen by ortho PA 5/17 where he was advised to keep compression dressing on for 24 hours and if oozing began through new bandage, he was to reinforce with ABD pads and notify office.  Patient returned to ortho on 05/31 for his regularly scheduled 2 week postop visit  where he reported that he had fallen down in his bathroom a couple of days prior resulting in significant pain in his knee.  Knee pain was located medially and was worsened with weight-bearing.  X-ray was obtained in their clinic and revealed no fracture and hardware was in place without abnormality.  He was advised to likely had a soft tissue injury and it was recommended to begin home health physical therapy due to difficulty leaving the house.  He returned to ortho on 06/17 where he denied any new traumas, fever or chills.  He denied complications with his incision or continued bleeding.  Patient was still nonweightbearing at that time due to pain and was requiring the use of a wheelchair.  There was no increased erythema, edema, warmth or drainage however normal postoperative swelling was noted.  AP and lateral weight-bearing exercise x-rays were obtained revealing cemented total knee arthroplasty in good position and sized appropriately for the patient without evidence of loosening or hardware failure.  It was at that time recommended that [facility] to work on aggressive range of motion which he was agreeable to.  [facility] day where he received intensive physical therapy to improve right knee pain and range of motion.    Today he reports doing "ok" but still cannot straighten his leg completely.  No records were available from [facility] at time of visit however per patient's report, his admission to the  rehab facility was uneventful. [facility] PT came on 6/1 and measured his range of motion at 15 degrees. Since that visit, he has been able to weight bear "a little bit on it" and is able to ambulate up the 3 stairs at home. He uses a walker around the house and denies any recent falls. He is sleeping ok and appetite is good. Denies fever, body aches, chills, nausea, vomiting, diarrhea, constipation, joint swelling. Joint  is warm but not red or swollen.  Incision is well approximated without erythema, edema or signs of infection.  He reports that he feels like his hamstring is very tight and he has been taking cyclobenzaprine to relax it at night.  Patient reports that physical therapist is planning to work on stretching as well as range of motion.  Patient completed the 2 weeks of Eliquis and is scheduled to see ortho next week.     There are no exam notes on file for this visit.    Past Medical History:   Diagnosis Date    Chronic pain     "lower back, right knee and arthritis"    DVT (deep venous thrombosis) (CMS HCC)     "left groin"    Essential hypertension     Neuropathy (CMS HCC)     Spinal cord compression (CMS HCC)     Wears glasses     readers     Past Surgical History:   Procedure Laterality Date    HX CERVICAL LAMINECTOMY  03/2021    C7-T1    HX CERVICAL SPINE SURGERY      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    KNEE SURGERY Right 11/09/2022    Dr. Katherene Ponto @ [facility]    LAMINECTOMY  03/2016    L4-5    REPLACEMENT TOTAL KNEE Right 03/2016     Outpatient Medications Prior to This Office Visit:  acetaminophen (TYLENOL 8 HOUR) 650 mg Oral Tablet Sustained Release, Take 1 Tablet (650 mg total) by mouth Every 8 hours as needed  atorvastatin (LIPITOR) 20 mg Oral Tablet, Take 1 Tablet (20 mg total) by mouth Once a day  cyanocobalamin, vitamin B-12, 5,000 mcg Oral Capsule, 3 capsule DAILY (route: oral)  cyclobenzaprine (FLEXERIL) 10 mg Oral Tablet, Take 1 Tablet (10 mg total) by mouth Three times a day  cyclobenzaprine (FLEXERIL) 10 mg Oral Tablet, Take 1 Tablet (10 mg total) by mouth Every 8 hours as needed  finasteride (PROSCAR) 5 mg Oral Tablet, Take 1 Tablet (5 mg total) by mouth Once a day  gabapentin (NEURONTIN) 300 mg Oral Capsule, Take 1 Capsule (300 mg total) by mouth Four times a day (Patient taking differently: Take 1 Capsule (300 mg total) by mouth Four times a day Takes 2 capsules bid)  lisinopriL (PRINIVIL) 40 mg Oral  Tablet, Take 1 Tablet (40 mg total) by mouth Once a day for 180 days  metoprolol tartrate (LOPRESSOR) 25 mg Oral Tablet, Take 1 Tablet (25 mg total) by mouth Twice daily for 180 days  multivitamin with iron Oral Tablet, Take 1 Tablet by mouth Once a day  ondansetron (ZOFRAN ODT) 4 mg Oral Tablet, Rapid Dissolve, Take 1 Tablet (4 mg total) by mouth Every 8 hours as needed for Nausea/Vomiting  triamcinolone acetonide (ARISTOCORT A) 0.1 % Ointment, Apply topically Twice daily  vit C/vit E acet/lutein/min (OCUVITE LUTEIN ORAL), Take 1 Tablet by mouth Once a day  oxyCODONE-acetaminophen (PERCOCET) 5-325 mg Oral Tablet, Take 1 Tablet by mouth Every 4 hours  as needed for Pain 2 tabs by mouth for severe pain  Do not exceed 2 tabs every 4 hours    No facility-administered medications prior to visit.     No Known Allergies  Family Medical History:       Problem Relation (Age of Onset)    Cancer Mother    Heart Disease Father    Macular Degen Mother            Social History     Tobacco Use    Smoking status: Former     Average packs/day: 1 pack/day for 20.0 years (20.0 ttl pk-yrs)     Types: Cigarettes     Start date: 1966    Smokeless tobacco: Never   Vaping Use    Vaping status: Never Used   Substance Use Topics    Alcohol use: Not Currently    Drug use: Not Currently     Types: Marijuana     Comment: medical marijuana card       Review of Systems:  Complete review of systems is negative EXCEPT as noted above within history of present illness.    Physical Exam:  Vitals:    12/30/22 1309   BP: 108/68   Pulse: 85   Resp: 18   Temp: 36.1 C (97 F)   SpO2: 98%   Weight: 87 kg (191 lb 12.8 oz)   Height: 1.753 m (5\' 9" )   BMI: 28.38         Physical Exam  Vitals and nursing note reviewed.   Constitutional:       General: He is not in acute distress.     Appearance: Normal appearance. He is normal weight. He is not ill-appearing.   HENT:      Head: Normocephalic and atraumatic.      Nose: Nose normal.      Mouth/Throat:       Mouth: Mucous membranes are moist.   Eyes:      Extraocular Movements: Extraocular movements intact.      Conjunctiva/sclera: Conjunctivae normal.      Pupils: Pupils are equal, round, and reactive to light.   Cardiovascular:      Rate and Rhythm: Normal rate and regular rhythm.      Pulses: Normal pulses.      Heart sounds: Normal heart sounds. No murmur heard.  Pulmonary:      Effort: Pulmonary effort is normal. No respiratory distress.      Breath sounds: Normal breath sounds.   Musculoskeletal:      Right knee: Swelling present. No bony tenderness or crepitus. Decreased range of motion. Tenderness present.      Left knee: Normal.      Right lower leg: No edema.      Left lower leg: No edema.        Legs:       Comments: Well-healed post surgical incision noted to anterior right knee without erythema, edema or signs of infection   Skin:     General: Skin is warm and dry.      Coloration: Skin is not jaundiced.   Neurological:      Mental Status: He is alert and oriented to person, place, and time.   Psychiatric:         Mood and Affect: Mood normal.         Behavior: Behavior normal.         Assessment/Plan:  1. Hospital discharge follow-up  2. S/P revision of total knee, right  Acute improving problem  Continue work with physical therapy and following with ortho    3. Thrombocytopenia (CMS HCC)  Acute problem  Repeat CBC today reveals improvement and near return to baseline platelets  We will continue to monitor  - CBC/DIFF; Future    4. Anemia, unspecified type  Acute improving problem  Patient denies any further bleeding since completing 2 weeks of blood thinner  Continue current regimen  We will continue to monitor    - CBC/DIFF; Future    5. 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    Seek medical attention for new or worsening symptoms.    Karoline Caldwell, NP      This note was partially created using MModal Fluency Direct system (voice recognition software) and is inherently subject to errors including those of syntax and "sound-alike" substitutions which may escape proofreading.  In such instances, original meaning may be extrapolated by contextual derivation.

## 2022-12-30 NOTE — Result Encounter Note (Signed)
Post-op labs improved from hospital and [facility]

## 2022-12-30 NOTE — Ancillary Notes (Signed)
[facility]    Venipuncture performed in office on left arm antecubital vein, dry pressure dressing was applied to site and patient tolerated it well.  Specimen was centrifuged, aliquoted as needed and specimen was labeled and packaged for transport.    Jenene Slicker, PHLEBOTOMIST  12/30/2022, 13:59

## 2023-01-03 ENCOUNTER — Ambulatory Visit (INDEPENDENT_AMBULATORY_CARE_PROVIDER_SITE_OTHER): Payer: Medicare Other | Admitting: Rehabilitative and Restorative Service Providers"

## 2023-01-03 ENCOUNTER — Encounter (INDEPENDENT_AMBULATORY_CARE_PROVIDER_SITE_OTHER): Payer: Medicare Other

## 2023-01-03 ENCOUNTER — Other Ambulatory Visit: Payer: Self-pay

## 2023-01-03 VITALS — Ht 69.0 in | Wt 191.0 lb

## 2023-01-03 DIAGNOSIS — Z96651 Presence of right artificial knee joint: Secondary | ICD-10-CM

## 2023-01-03 MED ORDER — CYCLOBENZAPRINE 10 MG TABLET
10.0000 mg | ORAL_TABLET | Freq: Three times a day (TID) | ORAL | 0 refills | Status: DC
Start: 2023-01-03 — End: 2023-02-10

## 2023-01-03 NOTE — Procedures (Signed)
[facility]  [address]  [place] [address]    Procedure Note    Name: [name] MRN:  [id number]   Date: 01/03/2023 DOB:  [birth year] (76 y.o.)         XR KNEE STANDING RIGHT (AMB ONLY)    Performed by: Cristy Folks, PA-C  Authorized by: Cristy Folks, PA-C    Documentation:      AP and lateral weight-bearing x-ray of the right knee today shows a cemented total knee arthroplasty with long stems in good position, and sized appropriately for the patient.  No radiographic evidence of hardware failure.      Cristy Folks, PA-C

## 2023-01-03 NOTE — Progress Notes (Signed)
[facility]  [address]  [facility] [place] [address]    Progress Note    Name: Gary White MRN:  [id number]   Date: 01/03/2023 DOB:  1945/08/29 (77 y.o.)               Reason for visit: Post Operative Revision Right TKA 11/09/2022     History of Present Illness:  Gary White is a 77 y.o. male who presents for Post Op (Revision Right total knee arthroplasty, femoral and tibial components 11/09/22 by Dr.Mcelroy )  Cherylann Ratel has no reported new trauma.  No reported fevers or chills. Denies any issues with incision.  [facility] at [facility], and states this really helped him.  He is now having [facility] home PT coming in a couple times a week.    PAST MEDICAL HISTORY:  Past Medical History:   Diagnosis Date    Chronic pain     "lower back, right knee and arthritis"    DVT (deep venous thrombosis) (CMS HCC)     "left groin"    Essential hypertension     Neuropathy (CMS HCC)     Spinal cord compression (CMS HCC)     Wears glasses     readers           PAST SURGICAL HISTORY:  Past Surgical History:   Procedure Laterality Date    HX CERVICAL LAMINECTOMY  03/2021    C7-T1    HX CERVICAL SPINE SURGERY      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    KNEE SURGERY Right 11/09/2022    Dr. Katherene Ponto @ [facility]    LAMINECTOMY  03/2016    L4-5    REPLACEMENT TOTAL KNEE Right 03/2016           MEDICATIONS:  Current Outpatient Medications   Medication Sig    acetaminophen (TYLENOL 8 HOUR) 650 mg Oral Tablet Sustained Release Take 1 Tablet (650 mg total) by mouth Every 8 hours as needed    atorvastatin (LIPITOR) 20 mg Oral Tablet Take 1 Tablet (20 mg total) by mouth Once a day    cyanocobalamin, vitamin B-12, 5,000 mcg Oral Capsule 3 capsule DAILY (route: oral)    cyclobenzaprine (FLEXERIL) 10 mg Oral Tablet Take 1 Tablet (10 mg total) by mouth Every 8 hours as needed    cyclobenzaprine (FLEXERIL) 10 mg Oral Tablet Take 1 Tablet (10 mg total) by mouth Three times a day     finasteride (PROSCAR) 5 mg Oral Tablet Take 1 Tablet (5 mg total) by mouth Once a day    gabapentin (NEURONTIN) 300 mg Oral Capsule Take 1 Capsule (300 mg total) by mouth Four times a day (Patient taking differently: Take 1 Capsule (300 mg total) by mouth Four times a day Takes 2 capsules bid)    lisinopriL (PRINIVIL) 40 mg Oral Tablet Take 1 Tablet (40 mg total) by mouth Once a day for 180 days    metoprolol tartrate (LOPRESSOR) 25 mg Oral Tablet Take 1 Tablet (25 mg total) by mouth Twice daily for 180 days    multivitamin with iron Oral Tablet Take 1 Tablet by mouth Once a day    ondansetron (ZOFRAN ODT) 4 mg Oral Tablet, Rapid Dissolve Take 1 Tablet (4 mg total) by mouth Every 8 hours as needed for Nausea/Vomiting    triamcinolone acetonide (ARISTOCORT A) 0.1 % Ointment Apply topically Twice daily    vit C/vit E acet/lutein/min (OCUVITE LUTEIN ORAL) Take  1 Tablet by mouth Once a day       ALLERGIES:  Allergy History as of 01/03/23        No Known Allergies                    FAMILY HISTORY:  Family Medical History:       Problem Relation (Age of Onset)    Cancer Mother    Heart Disease Father    Macular Degen Mother              SOCIAL HISTORY:  Social History     Tobacco Use    Smoking status: Former     Average packs/day: 1 pack/day for 20.0 years (20.0 ttl pk-yrs)     Types: Cigarettes     Start date: 1966    Smokeless tobacco: Never   Vaping Use    Vaping status: Never Used   Substance Use Topics    Alcohol use: Not Currently    Drug use: Not Currently     Types: Marijuana     Comment: medical marijuana card        Physical Exam:  Ht 1.753 m (5\' 9" )   Wt 86.6 kg (191 lb)   BMI 28.21 kg/m         GENERAL: he is alert, cooperative, no distress, appears stated age.      Orthopedic Evaluation:  Patient displays an assisted gait with the use of  a rollator walker.. Evaluation of the Right knee shows a well-healing incision, no wound dehiscence. No increased erythema, edema, warmth, purulent drainage.  Normal  postoperative swelling.  Calf is supple and nontender to palpation.  Patella tracks midline without instability.  Collateral ligament testing stable.  Range of motion 0 to 120.  Neurovascularly intact distally.    Assessment:  S/p revision right TKA    Plan:  I have stressed the importance of progression of range of motion and strengthening with physical therapy. Pain medications have been reviewed.  A scheduled follow up appointment has been set with Dr. Katherene Ponto in 6 weeks.  Continue to progress with physical therapy.  We will write patient a short script for Flexeril.    I am seeing this patient independently with co-signing supervising physician present in clinic.    Cristy Folks, PA-C      This note was partially generated using MModal Fluency Direct system, and there may be some incorrect words, spellings, and punctuation that were not noted in checking the note before saving

## 2023-01-04 ENCOUNTER — Encounter (INDEPENDENT_AMBULATORY_CARE_PROVIDER_SITE_OTHER): Payer: Self-pay

## 2023-01-04 ENCOUNTER — Ambulatory Visit: Payer: Medicare Other

## 2023-01-04 VITALS — BP 118/78 | HR 82 | Temp 97.0°F | Resp 18 | Ht 69.0 in | Wt 192.6 lb

## 2023-01-04 DIAGNOSIS — Z1159 Encounter for screening for other viral diseases: Secondary | ICD-10-CM | POA: Insufficient documentation

## 2023-01-04 DIAGNOSIS — Z13220 Encounter for screening for lipoid disorders: Secondary | ICD-10-CM

## 2023-01-04 DIAGNOSIS — Z131 Encounter for screening for diabetes mellitus: Secondary | ICD-10-CM

## 2023-01-04 DIAGNOSIS — Z1211 Encounter for screening for malignant neoplasm of colon: Secondary | ICD-10-CM | POA: Insufficient documentation

## 2023-01-04 DIAGNOSIS — Z0000 Encounter for general adult medical examination without abnormal findings: Secondary | ICD-10-CM

## 2023-01-04 DIAGNOSIS — D649 Anemia, unspecified: Secondary | ICD-10-CM

## 2023-01-04 DIAGNOSIS — Z125 Encounter for screening for malignant neoplasm of prostate: Secondary | ICD-10-CM

## 2023-01-04 NOTE — Nursing Note (Signed)
01/04/23 1121   Medicare Wellness Assessment   Medicare initial or wellness physical in the last year? No   Advance Directives   Does patient have a living will or MPOA Yes   Has patient provided [employer] with a copy? Yes   Activities of Daily Living   Do you need help with dressing, bathing, or walking? No   Do you need help with shopping, housekeeping, medications, or finances? No   Do you have rugs in hallways, broken steps, or poor lighting? No   Do you have grab bars in your bathroom, non-slip strips in your tub, and hand rails on your stairs? Yes   Cognitive Function Screen   What is you age? 1   What is the time to the nearest hour? 1   What is the year? 1   What is the name of this clinic? 1   Can the patient recognize two persons (the doctor, the nurse, home help, etc.)? 1   What is the date of your birth? (day and month sufficient)  1   In what year did World War II end? 1   Who is the current president of the [place]? 1   Count from 20 down to 1? 1   What address did I give you earlier? 1   Total Score 10   Interpretation of Total Score Greater than 6 Normal   Depression Screen   Little interest or pleasure in doing things. 0   Feeling down, depressed, or hopeless 0   PHQ 2 Total 0   Pain Score   Pain Score Three   Substance Use Screening   In Past 12 MONTHS, how often have you used any tobacco product (for example, cigarettes, e-cigarettes, cigars, pipes, or smokeless tobacco)? Never   In the PAST 12 MONTHS, how often have you had 5 (men)/4 (women) or more drinks containing alcohol in one day? Never   In the PAST 12 months, how often have you used any prescription medications just for the feeling, more than prescribed, or that were not prescribed for you? Prescriptions may include: opioids, benzodiazepines, medications for ADHD Never   In the PAST 12 MONTHS, how often have you used any drugs, including marijuana, cocaine or crack, heroin, methamphetamine, hallucinogens, ecstasy/MDMA? Never    Hearing Screen   Have you noticed any hearing difficulties? No   After whispering 9-1-6 how many numbers did the patient repeat correctly? 3   Fall Risk Assessment   Do you feel unsteady when standing or walking? Yes   Do you worry about falling? Yes   Have you fallen in the past year? Yes   How many times have you fallen? 2 or more times   Were you ever injured from falling? Yes   Timed up and go test (in seconds) 8   Urinary Incontinence Screen   Do you ever leak urine when you don't want to? No

## 2023-01-04 NOTE — Progress Notes (Signed)
FAMILY [facility]ICINE, [facility]  [address] [place] [address]  Operated by [facility]  Progress Note    Name: Gary White MRN:  [id number]   Date: 01/04/2023 DOB:  Sep 18, 1945 (77 y.o.)       Encounter Date: 01/04/2023   PCP: Wilmon Pali, MD      SUBJECTIVE:   Gary White is a 77 y.o. male for presenting for annual Medicare Wellness Visit.      Comprehensive Health Assessment:  Paper document COMPREHENSIVE HEALTH ASSESSMENT reviewed and scanned into medical record    I have reviewed and updated as appropriate the past medical, family and social history. 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     Current Outpatient Medications   Medication Sig    acetaminophen (TYLENOL 8 HOUR) 650 mg Oral Tablet Sustained Release Take 1 Tablet (650 mg total) by mouth Every 8 hours as needed    atorvastatin (LIPITOR) 20 mg Oral Tablet Take 1 Tablet (20 mg total) by mouth Once a day    cyanocobalamin, vitamin B-12, 5,000 mcg Oral Capsule 3 capsule DAILY (route: oral)    cyclobenzaprine (FLEXERIL) 10 mg Oral Tablet Take 1 Tablet (10 mg total) by mouth Every 8 hours as needed    cyclobenzaprine (FLEXERIL) 10 mg Oral Tablet Take 1 Tablet (10 mg total) by mouth Three times a day    finasteride (PROSCAR) 5 mg Oral Tablet Take 1 Tablet (5 mg total) by mouth Once a day    gabapentin (NEURONTIN) 300 mg Oral Capsule Take 1 Capsule (300 mg total) by mouth Four times a day (Patient taking  differently: Take 1 Capsule (300 mg total) by mouth Four times a day Takes 2 capsules bid)    lisinopriL (PRINIVIL) 40 mg Oral Tablet Take 1 Tablet (40 mg total) by mouth Once a day for 180 days    metoprolol tartrate (LOPRESSOR) 25 mg Oral Tablet Take 1 Tablet (25 mg total) by mouth Twice daily for 180 days    multivitamin with iron Oral Tablet Take 1 Tablet by mouth Once a day    ondansetron (ZOFRAN ODT) 4 mg Oral Tablet, Rapid Dissolve Take 1 Tablet (4 mg total) by mouth Every 8 hours as needed for Nausea/Vomiting    triamcinolone acetonide (ARISTOCORT A) 0.1 % Ointment Apply topically Twice daily    vit C/vit E acet/lutein/min (OCUVITE LUTEIN ORAL) Take 1 Tablet by mouth Once a day     Family Medical History:       Problem Relation (Age of Onset)    Cancer Mother    Heart Disease Father    Macular Degen Mother            Social History     Socioeconomic History  Marital status: Divorced   Tobacco Use    Smoking status: Former     Average packs/day: 1 pack/day for 20.0 years (20.0 ttl pk-yrs)     Types: Cigarettes     Start date: 1966    Smokeless tobacco: Never   Vaping Use    Vaping status: Never Used   Substance and Sexual Activity    Alcohol use: Not Currently    Drug use: Not Currently     Types: Marijuana     Comment: medical marijuana card     Social Determinants of Health     Financial Resource Strain: Low Risk  (05/23/2018)    [facility] from [facility]  (prior to 12/11/2021)    Overall Financial Resource Strain (CARDIA)     Difficulty of Paying Living Expenses: Not hard at all   Transportation Needs: No Transportation Needs (05/23/2018)    Received from [facility]  (prior to 12/11/2021)    PRAPARE - Therapist, art (Medical): No     Lack of Transportation (Non-Medical): No   Social Connections: Low Risk  (11/09/2022)    Social Connections     SDOH Social Isolation: 5 or more times a week   Health Literacy: Low Risk  (11/09/2022)    Health Literacy      SDOH Health Literacy: Never         List of Current Health Care Providers   Care Team       PCP       Name Type Specialty Phone Number    Wilmon Pali, MD Physician [facility] [phone]              Care Team       No care team found                      Health Maintenance   Topic Date Due    Hepatitis C screening  Never done    Colonoscopy  Never done    Shingles Vaccine (1 of 2) Never done    AAA Screening  Never done    Covid-19 Vaccine (1 - 2023-24 season) Never done    Influenza Vaccine (1) 02/26/2023    Adult Tdap-Td (2 - Td or Tdap) 05/14/2023    Depression Screening  01/04/2024    Medicare Annual Wellness Visit  01/04/2024    Pneumococcal Vaccination, Age 13+  Completed    Meningococcal Vaccine  Aged Out     Medicare Wellness Assessment   Medicare initial or wellness physical in the last year?: No  Advance Directives   Does patient have a living will or MPOA: Yes   Has patient provided [employer] with a copy?: Yes              Activities of Daily Living   Do you need help with dressing, bathing, or walking?: No   Do you need help with shopping, housekeeping, medications, or finances?: No   Do you have rugs in hallways, broken steps, or poor lighting?: No   Do you have grab bars in your bathroom, non-slip strips in your tub, and hand rails on your stairs?: Yes   Cognitive Function Screen (1=Yes, 0=No)   What is you age?: Correct   What is the time to the nearest hour?: Correct   What is the year?: Correct   What is the name of this clinic?: Correct  Can the patient recognize two persons (the doctor, the nurse, home help, etc.)?: Correct   What is the date of your birth? (day and month sufficient) : Correct   In what year did World War II end?: Correct   Who is the current president of the [place]?: Correct   Count from 20 down to 1?: Correct   What address did I give you earlier?: Correct   Total Score: 10   Interpretation of Total Score: Greater than 6 Normal   Fall Risk Screen    Do you feel unsteady when standing or walking?: Yes  Do you worry about falling?: Yes  Have you fallen in the past year?: Yes  How many times have you fallen?: 2 or more times  Were you ever injured from falling?: Yes  Timed up and go test (in seconds): 8   Depression Screen     Little interest or pleasure in doing things.: Not at all  Feeling down, depressed, or hopeless: Not at all  PHQ 2 Total: 0     Pain Score   Pain Score:   3    Substance Use-Abuse Screening     Tobacco Use     In Past 12 MONTHS, how often have you used any tobacco product (for example, cigarettes, e-cigarettes, cigars, pipes, or smokeless tobacco)?: Never     Alcohol use     In the PAST 12 MONTHS, how often have you had 5 (men)/4 (women) or more drinks containing alcohol in one day?: Never     Prescription Drug Use     In the PAST 12 months, how often have you used any prescription medications just for the feeling, more than prescribed, or that were not prescribed for you? Prescriptions may include: opioids, benzodiazepines, medications for ADHD: Never           Illicit Drug Use   In the PAST 12 MONTHS, how often have you used any drugs, including marijuana, cocaine or crack, heroin, methamphetamine, hallucinogens, ecstasy/MDMA?: Never        Hearing Screen   Have you noticed any hearing difficulties?: No  After whispering 9-1-6 how many numbers did the patient repeat correctly?: 3       Vision Screen             Urine Incontinence Screen   Urinary Incontinence Screen  Do you ever leak urine when you don't want to?: No       ROS:  Complete review of systems is negative EXCEPT as noted above within history of present illness.    OBJECTIVE:   Vitals:    01/04/23 1121   BP: 118/78   Pulse: 82   Resp: 18   Temp: 36.1 C (97 F)   SpO2: 98%   Weight: 87.4 kg (192 lb 9.6 oz)   Height: 1.753 m (5\' 9" )   BMI: 28.5       PHYSICAL EXAM:  Physical Exam    ASSESSMENT/PLAN:      Identified Risk Factors/ Recommended Actions   1. Medicare annual wellness  visit, subsequent  Patient is overall doing well. They have no socioeconomic needs and have a good psychosocial support system. Patient was given opportunity to ask questions and was encouraged to continue a healthy diet and to stay as physically active as possible.     2. Colon cancer screening  Normal screening at age 89 while living [place]  Agreeable to Cologaurd thus ordered today     3.  Diabetes mellitus screening  Labs ordered today. He will return fasting to complete    4. Need for hepatitis C screening test  Ordered today     5. Lipid screening  Ordered today. He will return fasting to complete    6. Prostate cancer screening  Ordered today        Fall Risk Follow up plan of care: Discussed optimizing home safety  The PHQ 2 Total: 0 depression screen is interpreted as negative.      Written Prevention Plan reviewed and shared in writing with the patient (See Patient Instructions in After Visit Summary).       There are no discontinued medications.    The patient has been educated about risk factors and recommended preventive care. Written Prevention Plan completed/ updated and given to patient (see After Visit Summary).   Follow up: Return in 6 months (on 07/07/2023).    Karoline Caldwell, NP    [facility] - [facility]  [facility]    This note was partially created using MModal Fluency Direct system (voice recognition software) and is inherently subject to errors including those of syntax and "sound-alike" substitutions which may escape proofreading.  In such instances, original meaning may be extrapolated by contextual derivation.

## 2023-01-04 NOTE — Patient Instructions (Addendum)
Medicare Preventive Services  Medicare coverage information Recommendation for YOU   Heart Disease and Diabetes   Lipid profile every 5 years or more often if at risk for cardiovascular disease  No results found for: "CHOLESTEROL", "HDLCHOL", "LDLCHOL", "LDLCHOLDIR", "TRIG"    Diabetes Screening with Blood Glucose test or Glucose Tolerance Test Yearly for those at risk for diabetes, up to two tests per year for those with prediabetes  Last Glucose:       Diabetes Self-Management Training   Initial training ten hours per year, and follow-up training two hours per subsequent year. Optional for those with diabetes    Medical Nutrition Therapy   Three hours of one-on-one counseling in first year, two hours in subsequent years. Optional for those with diabetes, kidney disease   Intensive Behavioral Therapy for Obesity  Face-to-face counseling, first month every week, month 2-6 every other week, month 7-12 every month if continued progress is documented Optional for those with Body Mass Index 30 or higher  Your Body mass index is 28.44 kg/m.   Tobacco Cessation (Quitting) Counseling   Two attempts per year, max 4 sessions per attempt, up to 8 sessions per year Optional for those who use tobacco    Cancer Screening Last Completion Date   Colorectal screening   For anyone age 29 to 50 or any age if high risk:  Screening Colonoscopy every 10 yrs if low risk,  more frequent if higher risk  OR  Cologuard Stool DNA test once every 3 years OR  Fecal Occult Blood Testing yearly OR  Flexible  Sigmoidoscopy  every 5 yr OR  CT Colonography every 5 yrs    At age 54 while in [facility]  Cologaurd ordered    Prostate Cancer Screening  Prostate Specific Antigen blood test based on joint decision making with your provider for ages 99-69  Ordered today    Lung Cancer Screening  Annual low dose computed tomography (LDCT scan) is recommended for those age 59-80 who smoked 20 pack-years and are current smokers or quit smoking within past 15 years,  after counseling by your doctor or nurse clinician about the possible benefits or harms.   See below for due date if applicable.   Vaccinations   Respiratory syncytial virus (RSV)  Age 47 years or older: Based on shared clinical decision-making with your provider.  Pneumococcal Vaccine  Recommended routinely age 29+ with one or two separate vaccines based on your risk. Recommended before age 70 if medical conditions with increased risk  Seasonal Influenza Vaccine  Once every flu season   Hepatitis B Vaccine  3 doses if risk (including anyone with diabetes or liver disease)  Shingles Vaccine  Two doses at age 32 or older  Diphtheria Tetanus Pertussis Vaccine  ONCE as adult, booster every 10 years   There is no immunization history for the selected administration types on file for this patient.  Shingles vaccine and Diphtheria Tetanus Pertussis vaccines are available at pharmacies or local health department without a prescription.   Other Preventative Screening  Last Completion Date   Glaucoma Screening   Yearly if in high risk group such as diabetes, family history, African American age 74+ or Hispanic American age 29+ See your Eye Care Provider   Hepatitis C Screening   Recommended  for those born between ages 18-79 years.   See below for due date if applicable.     HIV Testing  Recommended routinely at least ONCE, covered every year for age 48  to 65 regardless of risk, and every year for age over 44 who ask for the test or higher risk. Yearly or up to 3 times in pregnancy    See below for due date if applicable.  Bone Densitometry   Screening is recommended for Men ages 20 and above with one or more risk factor: androgen deprivation therapy for prostate cancer, hypogonadism, frailty, primary hyperparathyroidism, hyperthyroidism  For men diagnosed with osteoporosis, follow up is recommended every years or a frequency recommended by your provider     See below for due date if applicable.   Abdominal Aortic Aneurysm  Screening Ultrasound   Once with a family history of abdominal aortic aneurysms OR a male between 53-75 and have smoked at least 100 cigarettes in your lifetime.     Patient reports completed a few years ago while in [facility]        Your Personalized Schedule for Preventive Tests     Health Maintenance: Pending and Last Completed         Date Due Completion Date    Hepatitis C screening Never done Ordered today    Colonoscopy Never done Cologaurd ordered today     Shingles Vaccine (1 of 2) Never done Recommended to obtain from pharmacy or Health Department    AAA Screening Never done Previously completed by PCP in [place] Vaccine (1 - 2023-24 season) Never done Previously completed while living in [place]    Influenza Vaccine (1) 02/26/2023 03/26/2014    Adult Tdap-Td (2 - Td or Tdap) 05/14/2023 05/13/2013    Depression Screening 01/04/2024 01/04/2023    Medicare Annual Wellness Visit 01/04/2024 01/04/2023                For Information on Advanced Directives for Health Care:  :  [email or web address]  [place], [place], [place], [place] General Information: [email or web address]

## 2023-01-05 ENCOUNTER — Telehealth (INDEPENDENT_AMBULATORY_CARE_PROVIDER_SITE_OTHER): Payer: Self-pay | Admitting: Student in an Organized Health Care Education/Training Program

## 2023-01-05 NOTE — Telephone Encounter (Signed)
Spoke with patient. Stated he is no longer taking Coumadin. When he was in [facility], they put him on Eliquis for short term treatment, but he has completed that and states he is not currently on any blood thinner.     Med list updated.  Informed PCP of this information.

## 2023-01-05 NOTE — Telephone Encounter (Signed)
Understood. Thank you.     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 No other indication. Okay to not restart.]

## 2023-01-05 NOTE — Telephone Encounter (Signed)
LVM for patient to return my call.   Received notice that patient is currently taking Centrum Vitamin and interacts with Warfarin.   Per Dr. Sharlet Salina, patient is to d/c Centrum due to interaction.

## 2023-01-05 NOTE — Telephone Encounter (Signed)
FYI

## 2023-01-17 ENCOUNTER — Other Ambulatory Visit (INDEPENDENT_AMBULATORY_CARE_PROVIDER_SITE_OTHER): Payer: Self-pay

## 2023-01-17 DIAGNOSIS — R194 Change in bowel habit: Secondary | ICD-10-CM

## 2023-01-17 LAB — COLOGUARD® COLON CANCER SCREEN: COLOGUARD RESULT: POSITIVE — AB

## 2023-01-17 NOTE — Result Encounter Note (Signed)
Your stool test was positive.  Unfortunately, this means that you could have colon cancer.  It also means that we are going to have to have you get a colonoscopy to make sure that it is not cancer, and if there is any kind of mass, then it will need to be removed and tested for cancer.  Please refer to gastroenterology for colonoscopy (diagnostic).

## 2023-01-17 NOTE — Result Encounter Note (Signed)
Cologuard test positive for abnormal cells.  This could mean the presence precancerous or cancerous polyp.  Please refer patient to general surgery for diagnostic colonoscopy for positive Cologuard study

## 2023-01-20 ENCOUNTER — Encounter (INDEPENDENT_AMBULATORY_CARE_PROVIDER_SITE_OTHER): Payer: Self-pay

## 2023-01-26 ENCOUNTER — Ambulatory Visit: Payer: Medicare Other | Attending: Surgery | Admitting: Surgery

## 2023-01-26 ENCOUNTER — Other Ambulatory Visit (INDEPENDENT_AMBULATORY_CARE_PROVIDER_SITE_OTHER): Payer: Medicare Other

## 2023-01-26 ENCOUNTER — Encounter (INDEPENDENT_AMBULATORY_CARE_PROVIDER_SITE_OTHER): Payer: Self-pay | Admitting: Surgery

## 2023-01-26 ENCOUNTER — Other Ambulatory Visit: Payer: Self-pay

## 2023-01-26 VITALS — BP 112/66 | Ht 69.0 in | Wt 189.6 lb

## 2023-01-26 DIAGNOSIS — Z01818 Encounter for other preprocedural examination: Secondary | ICD-10-CM

## 2023-01-26 DIAGNOSIS — R195 Other fecal abnormalities: Secondary | ICD-10-CM

## 2023-01-26 DIAGNOSIS — R194 Change in bowel habit: Secondary | ICD-10-CM

## 2023-01-26 LAB — CBC
HCT: 42.6 % (ref 38.9–52.0)
HGB: 14.1 g/dL (ref 13.4–17.5)
MCH: 29.9 pg (ref 26.0–32.0)
MCHC: 33.1 g/dL (ref 31.0–35.5)
MCV: 90.3 fL (ref 78.0–100.0)
MPV: 9.4 fL (ref 8.7–12.5)
PLATELETS: 160 10*3/uL (ref 150–400)
RBC: 4.72 10*6/uL (ref 4.50–6.10)
RDW-CV: 14.3 % (ref 11.5–15.5)
WBC: 7 10*3/uL (ref 3.7–11.0)

## 2023-01-26 LAB — BASIC METABOLIC PANEL
ANION GAP: 4 mmol/L
BUN/CREA RATIO: 18
BUN: 15 mg/dL (ref 9–20)
CALCIUM: 10 mg/dL (ref 8.4–10.2)
CHLORIDE: 108 mmol/L — ABNORMAL HIGH (ref 98–107)
CO2 TOTAL: 26 mmol/L (ref 22–30)
CREATININE: 0.83 mg/dL (ref 0.66–1.25)
ESTIMATED GFR: 91 mL/min/{1.73_m2} (ref >=60)
GLUCOSE: 80 mg/dL (ref 74–106)
POTASSIUM: 4.9 mmol/L (ref 3.5–5.1)
SODIUM: 138 mmol/L (ref 137–145)

## 2023-01-26 NOTE — H&P (Signed)
GENERAL SURGERY, [facility]  [address]  [place] [address]  Operated by [facility]    Endoscopy Consultation      NAME: Gary White DOS: 01/26/2023   MRN: [id number] DOB: 1946/05/09     Chief Complaint: Colonoscopy (Pt presents for colonoscopy, positive Cologuard test, reports constipation, very hard stools difficult to pass, occasional blood on TP, reports BM twice a week)      History of Present Illness: Gary White is a 77 y.o. male who comes in today to establish care. This is a consultation for a/an colonoscopy.  He recently had a positive Cologuard test.     Previous colonoscopy:yes, 15 years ago, normal    History of colon polyps: no   Family history of colon cancer: no   Rectal bleeding: yes on the toilet tissue with bowel movements  Hematochezia:no   Change in bowel habits: yes recent constipation  Diarrhea:no   Constipation:yes   Intermittent constipation and diarrhea: no   Narrowing of stool: no   Ribbon like stool: no   Abdominal pain: no   Bloating: no   Dysphagia: no   Reflux: no   Heartburn: no   Hemoptysis: no   Fever and chills: no   Nausea: no   Vomiting: no   Weight Loss: no    No other acute concerns or complaints at this time.    Medical History   I have reviewed and updated as appropriate the past medical, surgical, family, and social history today:  Medical History/Surgical History/Family History/Social History  Past Medical History:   Diagnosis Date    Chronic pain     "lower back, right knee and arthritis"    DVT (deep venous thrombosis) (CMS HCC)     "left groin"    Essential hypertension     Neuropathy (CMS HCC)     Spinal cord compression (CMS HCC)     Wears glasses     readers         Past Surgical History:   Procedure Laterality Date    COLONOSCOPY      x 3, last colonoscopy at age 59 done at [facility]-all negative    HX CERVICAL LAMINECTOMY  03/2021    C7-T1    HX CERVICAL SPINE SURGERY      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    HX TONSILLECTOMY       HX WISDOM TEETH EXTRACTION      LAMINECTOMY  03/2016    L4-5    REPLACEMENT TOTAL KNEE Right     2017 and revision in 2024 with Dr Katherene Ponto at [facility]    REVISION RIGHT TOTAL KNEE ARTHROPLASTY Right 11/09/2022    Performed by Antoine Primas, MD at [facility]     Family Medical History:       Problem Relation (Age of Onset)    Cancer Mother    Heart Disease Father    Macular Degen Mother             Social History     Socioeconomic History    Marital status: Divorced   Tobacco Use    Smoking status: Former     Types: Cigarettes    Smokeless tobacco: Never   Vaping Use    Vaping status: Never Used   Substance and Sexual Activity    Alcohol use: Not Currently    Drug use: Not Currently     Types: Marijuana  Comment: medical marijuana card   Other Topics Concern    Ability to Walk 1 Flight of Steps without SOB/CP No    Ability To Do Own ADL's Yes     Social Determinants of Health     Financial Resource Strain: Low Risk  (05/23/2018)    [facility]  (prior to 12/11/2021)    Overall Financial Resource Strain (CARDIA)     Difficulty of Paying Living Expenses: Not hard at all   Transportation Needs: No Transportation Needs (05/23/2018)    Received from [facility]  (prior to 12/11/2021)    PRAPARE - Therapist, art (Medical): No     Lack of Transportation (Non-Medical): No   Social Connections: Low Risk  (11/09/2022)    Social Connections     SDOH Social Isolation: 5 or more times a week     Allergies:  No Known Allergies  Medications:  Current Outpatient Medications   Medication Sig    acetaminophen (TYLENOL 8 HOUR) 650 mg Oral Tablet Sustained Release Take 1 Tablet (650 mg total) by mouth Every 8 hours as needed    atorvastatin (LIPITOR) 20 mg Oral Tablet Take 1 Tablet (20 mg total) by mouth Once a day    cyanocobalamin, vitamin B-12, 5,000 mcg Oral Capsule 3 capsule DAILY (route: oral)    cyclobenzaprine (FLEXERIL) 10 mg Oral Tablet Take 1 Tablet (10  mg total) by mouth Every 8 hours as needed    cyclobenzaprine (FLEXERIL) 10 mg Oral Tablet Take 1 Tablet (10 mg total) by mouth Three times a day    finasteride (PROSCAR) 5 mg Oral Tablet Take 1 Tablet (5 mg total) by mouth Once a day    gabapentin (NEURONTIN) 300 mg Oral Capsule Take 1 Capsule (300 mg total) by mouth Four times a day (Patient taking differently: Take 1 Capsule (300 mg total) by mouth Four times a day Takes 2 capsules bid)    lisinopriL (PRINIVIL) 40 mg Oral Tablet Take 1 Tablet (40 mg total) by mouth Once a day for 180 days    metoprolol tartrate (LOPRESSOR) 25 mg Oral Tablet Take 1 Tablet (25 mg total) by mouth Twice daily for 180 days    multivitamin with iron Oral Tablet Take 1 Tablet by mouth Once a day    ondansetron (ZOFRAN ODT) 4 mg Oral Tablet, Rapid Dissolve Take 1 Tablet (4 mg total) by mouth Every 8 hours as needed for Nausea/Vomiting    triamcinolone acetonide (ARISTOCORT A) 0.1 % Ointment Apply topically Twice daily    vit C/vit E acet/lutein/min (OCUVITE LUTEIN ORAL) Take 1 Tablet by mouth Once a day     Problem List:  Patient Active Problem List    Diagnosis Date Noted    Encounter for diagnostic colonoscopy due to change in bowel habits 01/26/2023    Disease of spinal cord (CMS HCC) 12/30/2022    Painful orthopaedic hardware (CMS HCC) 11/09/2022    S/P revision of total knee, right 11/09/2022    History of deep vein thrombosis 11/09/2022    H/O degenerative disc disease 11/09/2022    Atherosclerosis of aorta (CMS HCC) 09/01/2022    Disorder involving thrombocytopenia (CMS HCC) 09/01/2022    Essential hypertension 09/01/2022    H/O laminectomy 09/01/2022    Deep vein thrombosis (DVT) of left lower extremity (CMS HCC) 09/01/2022    Joint laxity of right knee 09/01/2022    Peripheral vascular disease (CMS HCC)  10/28/2021    Multilevel degenerative disc disease 01/03/2017    Osteoarthritis of right knee 04/14/2016    Benign non-nodular prostatic hyperplasia with lower urinary tract  symptoms 12/09/2014    Neuropathy (CMS HCC) 12/09/2008     Review of Systems:  Constitutional: negative for fevers, chills  Eyes: negative for blurry/double vision or scleral icterus  HENT: negative for sores in mouth, hoarse voice, paralysis, headaches  Respiratory: negative for cough or shortness of breath  Cardiovascular: negative for chest pain, palpitations and syncope  Gastrointestinal: negative for dysphagia, odynophagia, dyspepsia, reflux symptoms, nausea, vomiting, melena, diarrhea, and abdominal pain  positive for change in bowel habits and constipation  Genitourinary: negative for frequency, dysuria, nocturia and urinary incontinence  Hematologic/lymphatic: negative for easy bruising, bleeding, lymphadenopathy and petechiae  Musculoskeletal:negative except for no change  Neurological: negative for seizures, tremor and weakness   Skin:  Negative for rashes, jaundice, itching  Behavioral/Psych: negative for anxiety and depression    Objective   Vitals:  Vitals:    01/26/23 1253   BP: 112/66   Weight: 86 kg (189 lb 9.5 oz)   Height: 1.753 m (5\' 9" )   BMI: 28.06      Physical Exam:  HENT:      Head: Normocephalic and atraumatic.      Nose: Nose normal.   Eyes:      Conjunctiva/sclera: Conjunctivae normal.      Pupils: Pupils are equal, round, and reactive to light.   Neck:      Thyroid: No thyromegaly.      Vascular: No JVD.      Trachea: No tracheal deviation.   Cardiovascular:      Rate and Rhythm: Normal rate and regular rhythm.      Heart sounds: Normal heart sounds. No murmur heard.   No friction rub. No gallop.    Pulmonary:      Effort: Pulmonary effort is normal. No respiratory distress.      Breath sounds: Normal breath sounds. No wheezing or rales.   Abdominal:      General: Bowel sounds are normal. There is no distension.      Palpations: Abdomen is soft. There is no mass.      Tenderness: There is no abdominal tenderness. There is no guarding or rebound.   Musculoskeletal:         General: Normal  range of motion.      Cervical back: Normal range of motion and neck supple.   Lymphadenopathy:      Cervical: No cervical adenopathy.   Skin:     General: Skin is warm and dry.      Findings: No erythema or rash.   Neurological:      Mental Status: She is alert and oriented to person, place, and time.   Psychiatric:         Mood and Affect: Mood and affect normal.         Cognition and Memory: Memory normal.         Judgment: Judgment normal.     Nursing Notes:   Hilliard Clark, RN  01/26/23 1340  Signed  Patient scheduled for colonoscopy on 01/26/23@[facility] .  Procedure and 2 Day Miralax prep instructions given and verbally explained.  Patient voiced understanding.    Hilliard Clark, RN      Assessment/Plan   Diagnosis/Plan:    ICD-10-CM    1. Positive colorectal cancer screening using Cologuard test  R19.5  2. Encounter for diagnostic colonoscopy due to change in bowel habits  R19.4 BASIC METABOLIC PANEL     CBC     CASE REQUEST SURGICAL: COLONOSCOPY      3. Preop testing  Z01.818 BASIC METABOLIC PANEL     CBC           Follow Up  No follow-ups on file.    Patient will be scheduled for EGD and colonoscopy. Discussed procedure(s) at length and answered all the patient's questions. Risks including bleeding, infection, injury to structures, need for additional procedures was discussed.  Benefits and alternatives were also addressed.  Written informed consent was obtained.    Audry Pili, DO      This note was partially created using M*Modal fluency direct system (voice recognition software ) and is inherently subject to errors including those of syntax and "sound- alike" substitutions which may escape proofreading.  In such instances, original meaning may be extrapolated by contextual derivation.

## 2023-01-26 NOTE — Nursing Note (Signed)
Patient scheduled for colonoscopy on 01/26/23@[facility] .  Procedure and 2 Day Miralax prep instructions given and verbally explained.  Patient voiced understanding.    Hilliard Clark, RN

## 2023-01-26 NOTE — Ancillary Notes (Signed)
[place]    Venipuncture performed in office on right arm antecubital vein, dry pressure dressing was applied to site and patient tolerated it well.  Specimen was centrifuged, aliquoted as needed and specimen was labeled and packaged for transport.    Merry Proud, PHLEBOTOMIST  01/26/2023, 13:50

## 2023-01-31 ENCOUNTER — Telehealth (INDEPENDENT_AMBULATORY_CARE_PROVIDER_SITE_OTHER): Payer: Self-pay

## 2023-01-31 DIAGNOSIS — G629 Polyneuropathy, unspecified: Secondary | ICD-10-CM

## 2023-01-31 DIAGNOSIS — M792 Neuralgia and neuritis, unspecified: Secondary | ICD-10-CM

## 2023-01-31 DIAGNOSIS — G959 Disease of spinal cord, unspecified: Secondary | ICD-10-CM

## 2023-01-31 NOTE — Telephone Encounter (Signed)
-----   Message from [place] E. Kitch sent at 01/20/2023  4:24 PM EDT -----  Regarding: Referral  Contact: [phone]  [facility], were you able to contact [facility] for an appointment to see me?

## 2023-01-31 NOTE — Telephone Encounter (Signed)
I sent a message to the neurology APP however did not receive response back and I do not see any notes in EMR. Will gladly place referral for consultation to see if they can offer him any treatments/recommendations.

## 2023-01-31 NOTE — Telephone Encounter (Signed)
Pt aware via MyChart. Referral ordered.

## 2023-01-31 NOTE — Telephone Encounter (Signed)
Per chart review, this was discussed at his appt on 7/5. It was to be discussed with his PCP about how to proceed with treatment. I have attached the appropriate portion of the note.     Note from 7/5:    5. 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

## 2023-02-01 ENCOUNTER — Telehealth (INDEPENDENT_AMBULATORY_CARE_PROVIDER_SITE_OTHER): Payer: Self-pay

## 2023-02-01 NOTE — Telephone Encounter (Signed)
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 If patient would like to research other providers that he would be willing to travel to see for his condition I am happy to refer him there. Otherwise, I recommend keep appt with [facility] to see what recommendations they have for him.

## 2023-02-02 NOTE — Telephone Encounter (Signed)
Tried reaching patient no answer.

## 2023-02-02 NOTE — Telephone Encounter (Signed)
Patient viewed message on MyChart. Last read by Conley Canal at  9:15 AM on 02/02/2023.

## 2023-02-05 ENCOUNTER — Other Ambulatory Visit (INDEPENDENT_AMBULATORY_CARE_PROVIDER_SITE_OTHER): Payer: Self-pay | Admitting: Rehabilitative and Restorative Service Providers"

## 2023-02-05 DIAGNOSIS — Z96651 Presence of right artificial knee joint: Secondary | ICD-10-CM

## 2023-02-08 NOTE — Progress Notes (Signed)
[facility]  [address]  [facility] [address]      History and Physical      Name: Gary White MRN:  [id number]   Date: 02/15/2023 DOB:  11/25/1945 (77 y.o.)     Date of Birth: Oct 18, 1945      Chief Complaint: Post Op (Revision right total knee arthroplasty on 11/09/22. )      HPI: Gary White is a 77 y.o. male presenting for a follow-up visit for a RT TKA with a possible hinge which was performed on approximately 11/09/22. Patient endorses that he has been experiencing symptoms of back pain recently. He reports that he underwent an MRI yesterday at [facility] for his back pain. Patient relays that he was previously prescribed Flexeril, and is currently using a wheelchair. He currently rates his pain as a 0/10, and admits that his right leg has been numb. Patient states that he is currently following with Dr. Gershon Crane. He reports that he used to run half marathons in the past. Patient voices that he is able to stand up without holding on to anything. Discussed with the patient that he can call and schedule another appointment if he is experiencing any problems in the future. No further associated symptoms or complaints are reported at this time.    Review of Systems:  Review of Systems   Constitutional: Negative.    Respiratory: Negative.     Musculoskeletal: Negative.    Skin: Negative.      Past Medical History:  Past Medical History:   Diagnosis Date    Chronic pain     "lower back, right knee and arthritis"    DVT (deep venous thrombosis) (CMS HCC)     "left groin"    Essential hypertension     Neuropathy (CMS HCC)     Spinal cord compression (CMS HCC)     Wears glasses     readers         Past Surgical History:   Past Surgical History:   Procedure Laterality Date    Colonoscopy      Hx cervical laminectomy  03/2021    Hx cervical spine surgery      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    Hx tonsillectomy      Hx wisdom teeth extraction       Laminectomy  03/2016    Replacement total knee Right      Allergies:  No Known Allergies  Medications:  Current Outpatient Medications   Medication Sig    acetaminophen (TYLENOL 8 HOUR) 650 mg Oral Tablet Sustained Release Take 1 Tablet (650 mg total) by mouth Every 8 hours as needed    atorvastatin (LIPITOR) 20 mg Oral Tablet Take 1 Tablet (20 mg total) by mouth Once a day    cyanocobalamin, vitamin B-12, 5,000 mcg Oral Capsule 3 capsule DAILY (route: oral)    cyclobenzaprine (FLEXERIL) 10 mg Oral Tablet Take 1 Tablet (10 mg total) by mouth Every 8 hours as needed    cyclobenzaprine (FLEXERIL) 10 mg Oral Tablet TAKE 1 TABLET BY MOUTH THREE TIMES DAILY    finasteride (PROSCAR) 5 mg Oral Tablet Take 1 Tablet (5 mg total) by mouth Once a day    gabapentin (NEURONTIN) 300 mg Oral Capsule Take 1 Capsule (300 mg total) by mouth Four times a day    lisinopriL (PRINIVIL) 40 mg Oral Tablet Take 1 Tablet (40 mg total) by mouth Once a day for 180 days  metoprolol tartrate (LOPRESSOR) 25 mg Oral Tablet Take 1 Tablet (25 mg total) by mouth Twice daily for 180 days    multivitamin with iron Oral Tablet Take 1 Tablet by mouth Once a day    ondansetron (ZOFRAN ODT) 4 mg Oral Tablet, Rapid Dissolve Take 1 Tablet (4 mg total) by mouth Every 8 hours as needed for Nausea/Vomiting    triamcinolone acetonide (ARISTOCORT A) 0.1 % Ointment Apply topically Twice daily    vit C/vit E acet/lutein/min (OCUVITE LUTEIN ORAL) Take 1 Tablet by mouth Once a day     Family History:  Family Medical History:       Problem Relation (Age of Onset)    Cancer Mother    Heart Disease Father    Macular Degen Mother            Social History:  Social History     Socioeconomic History    Marital status: Divorced   Tobacco Use    Smoking status: Former     Types: Cigarettes    Smokeless tobacco: Never   Vaping Use    Vaping status: Never Used   Substance and Sexual Activity    Alcohol use: Not Currently    Drug use: Not Currently     Types: Marijuana      Comment: medical marijuana card   Other Topics Concern    Ability to Walk 1 Flight of Steps without SOB/CP No    Ability To Do Own ADL's Yes     Social Determinants of Health     Financial Resource Strain: Low Risk  (05/23/2018)    [facility]  (prior to 12/11/2021), [facility]  (prior to 12/11/2021)    Overall Financial Resource Strain (CARDIA)     Difficulty of Paying Living Expenses: Not hard at all   Transportation Needs: No Transportation Needs (05/23/2018)    Received from [facility]  (prior to 12/11/2021), [facility]  (prior to 12/11/2021)    PRAPARE - Therapist, art (Medical): No     Lack of Transportation (Non-Medical): No   Social Connections: Low Risk  (11/09/2022)    Social Connections     SDOH Social Isolation: 5 or more times a week       Objective:  BP 116/68   Pulse 95   Ht 1.753 m (5\' 9" )   Wt 85.7 kg (189 lb)   SpO2 97%   BMI 27.91 kg/m       Body mass index is 27.91 kg/m.    Physical Exam:  Physical Exam  Constitutional:       General: He is not in acute distress.  HENT:      Head: Normocephalic and atraumatic.   Pulmonary:      Effort: Pulmonary effort is normal.      Breath sounds: Normal breath sounds.   Musculoskeletal:         General: Normal range of motion.      Cervical back: Normal range of motion.      Comments: Right knee:  Incision well healed. ROM 5-120 degrees. Ligamentously stable.    Skin:     General: Skin is warm and dry.   Neurological:      General: No focal deficit present.      Mental Status: He is oriented to person, place, and time.       Laboratory Studies/Data  Reviewed:  I have reviewed all available imagining and laboratory studies as appropriate.  Appointment on 01/26/2023   Component Date Value Ref Range Status    SODIUM 01/26/2023 138  137 - 145 mmol/L Final    POTASSIUM 01/26/2023 4.9  3.5 - 5.1 mmol/L Final    CHLORIDE 01/26/2023 108 (H)  98 - 107 mmol/L  Final    CO2 TOTAL 01/26/2023 26  22 - 30 mmol/L Final    ANION GAP 01/26/2023 4  mmol/L Final    CALCIUM 01/26/2023 10.0  8.4 - 10.2 mg/dL Final    GLUCOSE 13/01/6577 80  74 - 106 mg/dL Final    BUN 46/96/2952 15  9 - 20 mg/dL Final    CREATININE 84/13/2440 0.83  0.66 - 1.25 mg/dL Final    BUN/CREA RATIO 01/26/2023 18   Final    ESTIMATED GFR 01/26/2023 91  >=60 mL/min/1.57m^2 Final    WBC 01/26/2023 7.0  3.7 - 11.0 x10^3/uL Final    RBC 01/26/2023 4.72  4.50 - 6.10 x10^6/uL Final    HGB 01/26/2023 14.1  13.4 - 17.5 g/dL Final    HCT 04/23/2535 42.6  38.9 - 52.0 % Final    MCV 01/26/2023 90.3  78.0 - 100.0 fL Final    MCH 01/26/2023 29.9  26.0 - 32.0 pg Final    MCHC 01/26/2023 33.1  31.0 - 35.5 g/dL Final    RDW-CV 64/40/3474 14.3  11.5 - 15.5 % Final    PLATELETS 01/26/2023 160  150 - 400 x10^3/uL Final    MPV 01/26/2023 9.4  8.7 - 12.5 fL Final       Encounter Medications and Orders  No orders of the defined types were placed in this encounter.      Plan:  Continue strengthening, home exercise program.  He is being worked up for radiculopathy of his right lower extremity.          Diagnosis:    ICD-10-CM    1. Status post revision of total replacement of right knee  Z96.651              I am scribing for, and in the presence of, Dr. Antoine Primas for services provided on 02/15/2023.  Lianne Cure, [place]   I personally performed the services described in this documentation, as scribed  in my presence, and it is both accurate  and complete.    Antoine Primas, MD  02/15/2023, 15:38        This note was partially generated using MModal Fluency Direct system, and there may be some incorrect words, spellings, and punctuation that were not noted in checking the note before saving.

## 2023-02-09 ENCOUNTER — Telehealth (INDEPENDENT_AMBULATORY_CARE_PROVIDER_SITE_OTHER): Payer: Self-pay | Admitting: Surgery

## 2023-02-09 NOTE — Telephone Encounter (Signed)
Patient would like to cancel his procedure due to other health issues and will call back when he is ready to schedule.

## 2023-02-09 NOTE — Telephone Encounter (Signed)
OR scheduling notified via voicemail.     Sharlene Motts, RN  02/09/2023 16:18

## 2023-02-12 ENCOUNTER — Encounter (INDEPENDENT_AMBULATORY_CARE_PROVIDER_SITE_OTHER): Payer: Self-pay | Admitting: Student in an Organized Health Care Education/Training Program

## 2023-02-13 ENCOUNTER — Inpatient Hospital Stay (HOSPITAL_COMMUNITY): Admission: RE | Admit: 2023-02-13 | Payer: Medicare Other | Source: Ambulatory Visit | Admitting: Surgery

## 2023-02-13 ENCOUNTER — Encounter (HOSPITAL_COMMUNITY): Admission: RE | Payer: Self-pay | Source: Ambulatory Visit

## 2023-02-13 DIAGNOSIS — R194 Change in bowel habit: Secondary | ICD-10-CM | POA: Insufficient documentation

## 2023-02-13 SURGERY — COLONOSCOPY
Anesthesia: Monitor Anesthesia Care

## 2023-02-13 MED ORDER — GABAPENTIN 300 MG CAPSULE
300.0000 mg | ORAL_CAPSULE | Freq: Four times a day (QID) | ORAL | 0 refills | Status: DC
Start: 2023-02-13 — End: 2023-05-08

## 2023-02-13 NOTE — Telephone Encounter (Signed)
Last scheduled appointment with you was 09/01/2022.  Currently scheduled future appointment is 03/08/2023.      Medication Dispense Information    GABAPENTIN 300MG     CAP   Sig: TAKE 1 CAPSULE BY MOUTH 4 TIMES DAILY   Dispensed: 01/13/2023 12:00 AM   Days supply: 30   Quantity: 120 Each   Refills remaining: 0   Pharmacy: [facility] [address] [place] - [phone]  [address]  [place] [address]  Phone: [phone]  Fax: [phone]   Authorizing provider: Wilmon Pali, MD  [address]  [place] [address]  Phone: [phone]  Fax: [phone]   Received from: [facility] (Fill History, Ambulatory)   Brand or Generic: Generic     Webb Laws, RN  02/13/2023, 08:51

## 2023-02-13 NOTE — Telephone Encounter (Signed)
[name]  P [facility] (supporting Wilmon Pali, MD)21 hours ago (11:13 AM)     GS  Please authorize a new prescription  for 300 mg Gabapentin.   Thank you.  Gary White

## 2023-02-15 ENCOUNTER — Encounter (INDEPENDENT_AMBULATORY_CARE_PROVIDER_SITE_OTHER): Payer: Self-pay | Admitting: Student in an Organized Health Care Education/Training Program

## 2023-02-15 ENCOUNTER — Ambulatory Visit (INDEPENDENT_AMBULATORY_CARE_PROVIDER_SITE_OTHER): Payer: Medicare Other | Admitting: Specialist

## 2023-02-15 ENCOUNTER — Encounter (INDEPENDENT_AMBULATORY_CARE_PROVIDER_SITE_OTHER): Payer: Self-pay | Admitting: Specialist

## 2023-02-15 ENCOUNTER — Encounter (INDEPENDENT_AMBULATORY_CARE_PROVIDER_SITE_OTHER): Payer: Self-pay

## 2023-02-15 ENCOUNTER — Other Ambulatory Visit: Payer: Self-pay

## 2023-02-15 VITALS — BP 116/68 | HR 95 | Ht 69.0 in | Wt 189.0 lb

## 2023-02-15 DIAGNOSIS — Z96651 Presence of right artificial knee joint: Secondary | ICD-10-CM

## 2023-02-15 DIAGNOSIS — Z471 Aftercare following joint replacement surgery: Secondary | ICD-10-CM

## 2023-02-26 ENCOUNTER — Other Ambulatory Visit (INDEPENDENT_AMBULATORY_CARE_PROVIDER_SITE_OTHER): Payer: Self-pay | Admitting: Student in an Organized Health Care Education/Training Program

## 2023-02-28 ENCOUNTER — Ambulatory Visit (INDEPENDENT_AMBULATORY_CARE_PROVIDER_SITE_OTHER): Payer: Self-pay | Admitting: Surgery

## 2023-02-28 NOTE — Telephone Encounter (Signed)
Last scheduled appointment with you was 09/01/2022.  Currently scheduled future appointment is 03/08/2023.      Webb Laws, RN  02/28/2023, 08:29

## 2023-03-08 ENCOUNTER — Encounter (INDEPENDENT_AMBULATORY_CARE_PROVIDER_SITE_OTHER): Payer: Self-pay | Admitting: Student in an Organized Health Care Education/Training Program

## 2023-03-08 ENCOUNTER — Ambulatory Visit
Payer: Medicare Other | Attending: Student in an Organized Health Care Education/Training Program | Admitting: Student in an Organized Health Care Education/Training Program

## 2023-03-08 ENCOUNTER — Other Ambulatory Visit: Payer: Self-pay

## 2023-03-08 VITALS — BP 90/54 | HR 76 | Temp 97.7°F | Resp 16 | Ht 69.0 in | Wt 195.0 lb

## 2023-03-08 DIAGNOSIS — N401 Enlarged prostate with lower urinary tract symptoms: Secondary | ICD-10-CM | POA: Insufficient documentation

## 2023-03-08 DIAGNOSIS — I10 Essential (primary) hypertension: Secondary | ICD-10-CM | POA: Insufficient documentation

## 2023-03-08 DIAGNOSIS — Z87891 Personal history of nicotine dependence: Secondary | ICD-10-CM

## 2023-03-08 MED ORDER — FINASTERIDE 5 MG TABLET
5.0000 mg | ORAL_TABLET | Freq: Every day | ORAL | 1 refills | Status: DC
Start: 2023-03-08 — End: 2023-12-08

## 2023-03-08 MED ORDER — METOPROLOL TARTRATE 25 MG TABLET
25.0000 mg | ORAL_TABLET | Freq: Two times a day (BID) | ORAL | 0 refills | Status: DC
Start: 2023-03-08 — End: 2023-11-13

## 2023-03-08 MED ORDER — LISINOPRIL 30 MG TABLET
30.0000 mg | ORAL_TABLET | Freq: Every day | ORAL | 1 refills | Status: DC
Start: 2023-03-08 — End: 2023-09-07

## 2023-03-08 NOTE — Progress Notes (Signed)
INTERNAL MEDICINE, [facility]  [address] [place] [address]  Operated by [facility]  Progress Note    Name: Gary White MRN:  [id number]   Date: 03/08/2023 DOB:  15-Sep-1945 (77 y.o.)             Reason for Visit: Follow Up 6 Months (Denies any problems at this time. States he is scheduled to have back surgery on 03/27/23. )    History of Present Illness  Gary White is a 77 y.o. male who is being seen in our clinic today for the above reason, as well as to follow up on his chronic conditions.    Problem   Essential Hypertension    Lisino 40, metoprolol 25 twice daily   Adherent daily. No symptoms or side effects  Home blood pressures are similar  BP Readings from Last 5 Encounters:   03/08/23 (!) 90/54   02/15/23 116/68   01/26/23 112/66   01/04/23 118/78   12/30/22 108/68     No chest pain, sob     Benign Non-Nodular Prostatic Hyperplasia With Lower Urinary Tract Symptoms    Finasteride   Adherent as prescribed. No symptoms or side effects.   Doing well on it           Nursing Notes:   Webb Laws, RN  03/08/23 1511  Signed     03/08/23 1511   Recent Weight Change   Have you had a recent unexplained weight loss or gain? N   Domestic Violence   Because we are aware of abuse and domestic violence today, we ask all patients: Are you being hurt, hit, or frightened by anyone at your home or in your life?  N   Basic Needs   Do you have any basic needs within your home that are not being met? (such as Food, Shelter, [employer] fast streamer, Tranportation, paying for bills and/or medications) N          Medical History     Diagnosis Date Comment Source    Chronic pain  "lower back, right knee and arthritis"     DVT (deep venous thrombosis) (CMS HCC)  "left groin"     Essential hypertension       Neuropathy (CMS HCC)       Spinal cord compression (CMS HCC)       Wears glasses  readers          Past Surgical History:   Procedure Laterality Date    COLONOSCOPY      x 3, last colonoscopy  at age 48 done at [facility]-all negative    HX CERVICAL LAMINECTOMY  03/2021    C7-T1    HX CERVICAL SPINE SURGERY      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    HX TONSILLECTOMY      HX WISDOM TEETH EXTRACTION      LAMINECTOMY  03/2016    L4-5    REPLACEMENT TOTAL KNEE Right     2017 and revision in 2024 with Dr Katherene Ponto at [facility]             Medications  acetaminophen (TYLENOL 8 HOUR) 650 mg Oral Tablet Sustained Release, Take 1 Tablet (650 mg total) by mouth Every 8 hours as needed  atorvastatin (LIPITOR) 20 mg Oral Tablet, Take 1 Tablet (20 mg total) by mouth Once a day  cyanocobalamin, vitamin B-12, 5,000 mcg Oral Capsule, 3 capsule DAILY (route: oral)  cyclobenzaprine (FLEXERIL) 10 mg  Oral Tablet, Take 1 Tablet (10 mg total) by mouth Every 8 hours as needed  cyclobenzaprine (FLEXERIL) 10 mg Oral Tablet, TAKE 1 TABLET BY MOUTH THREE TIMES DAILY  gabapentin (NEURONTIN) 300 mg Oral Capsule, Take 1 Capsule (300 mg total) by mouth Four times a day  multivitamin with iron Oral Tablet, Take 1 Tablet by mouth Once a day  ondansetron (ZOFRAN ODT) 4 mg Oral Tablet, Rapid Dissolve, Take 1 Tablet (4 mg total) by mouth Every 8 hours as needed for Nausea/Vomiting  triamcinolone acetonide (ARISTOCORT A) 0.1 % Ointment, Apply topically Twice daily  vit C/vit E acet/lutein/min (OCUVITE LUTEIN ORAL), Take 1 Tablet by mouth Once a day  finasteride (PROSCAR) 5 mg Oral Tablet, Take 1 Tablet (5 mg total) by mouth Once a day  lisinopriL (PRINIVIL) 40 mg Oral Tablet, Take 1 tablet by mouth once daily  metoprolol tartrate (LOPRESSOR) 25 mg Oral Tablet, Take 1 tablet by mouth twice daily    No facility-administered medications prior to visit.    I have reviewed and reconciled the medication list with the patient today.        No Known Allergies    Family Medical History:       Problem Relation (Age of Onset)    Cancer Mother    Heart Disease Father    Macular Degen Mother              Social History     Tobacco Use    Smoking status: Former      Types: Cigarettes    Smokeless tobacco: Never   Vaping Use    Vaping status: Never Used   Substance Use Topics    Alcohol use: Not Currently    Drug use: Not Currently     Types: Marijuana     Comment: medical marijuana card          Health Maintenance   Topic Date Due    Hepatitis C screening  Never done    Shingles Vaccine (1 of 2) Never done    Influenza Vaccine (1) 02/26/2023    Covid-19 Vaccine (1 - 2023-24 season) Never done    Adult Tdap-Td (2 - Td or Tdap) 05/14/2023    Depression Screening  01/04/2024    Medicare Annual Wellness Visit  01/04/2024    Pneumococcal Vaccination, Age 69+  Completed          Review of Systems  Rest of ROS negative except as described in the HPI above.    BP (!) 90/54 (Site: Left Arm, Patient Position: Sitting, Cuff Size: Adult)   Pulse 76   Temp 36.5 C (97.7 F) (Temporal)   Resp 16   Ht 1.753 m (5\' 9" )   Wt 88.5 kg (195 lb) Comment: patient reports. in wheelchair  SpO2 96%   BMI 28.80 kg/m       Physical Exam  Constitutional:       General: He is not in acute distress.  HENT:      Head: Normocephalic.      Mouth/Throat:      Pharynx: No oropharyngeal exudate.   Eyes:      Conjunctiva/sclera: Conjunctivae normal.   Cardiovascular:      Rate and Rhythm: Normal rate and regular rhythm.      Pulses: Normal pulses.      Heart sounds: Normal heart sounds. No murmur heard.     No friction rub. No gallop.   Pulmonary:      Effort: Pulmonary  effort is normal. No respiratory distress.      Breath sounds: Normal breath sounds. No stridor. No wheezing, rhonchi or rales.   Skin:     General: Skin is warm.   Neurological:      General: No focal deficit present.      Mental Status: He is alert and oriented to person, place, and time.   Psychiatric:         Mood and Affect: Mood normal.         Behavior: Behavior normal.         Thought Content: Thought content normal.                         Assessment/Plan:    Problem List Items Addressed This Visit          Cardiovascular System     Essential hypertension - Primary     Below goal  Decrease lisinopril from 40 to 30   Continue with metoprolol  RTC 3-6 months         Relevant Medications    lisinopriL (PRINIVIL) 30 mg Oral Tablet    metoprolol tartrate (LOPRESSOR) 25 mg Oral Tablet       Nephrology    Benign non-nodular prostatic hyperplasia with lower urinary tract symptoms     Chronic/stable.Tolerating meds.No side effects.Return in 6 months. Will continue with the medications.          Relevant Medications    finasteride (PROSCAR) 5 mg Oral Tablet        Orders Placed This Encounter    lisinopriL (PRINIVIL) 30 mg Oral Tablet    metoprolol tartrate (LOPRESSOR) 25 mg Oral Tablet    finasteride (PROSCAR) 5 mg Oral Tablet      Medications Discontinued During This Encounter   Medication Reason    finasteride (PROSCAR) 5 mg Oral Tablet Reorder    lisinopriL (PRINIVIL) 40 mg Oral Tablet Reorder    metoprolol tartrate (LOPRESSOR) 25 mg Oral Tablet Reorder         Follow up: Return in about 6 months (around 09/05/2023).    Seek medical attention for new or worsening symptoms.      Wilmon Pali, MD       This note was partially created using MModal Fluency Direct system (voice recognition software) and is inherently subject to errors including those of syntax and "sound-alike" substitutions which may escape proofreading.  In such instances, original meaning may be extrapolated by contextual derivation.

## 2023-03-08 NOTE — Assessment & Plan Note (Signed)
Chronic/stable.Tolerating meds.No side effects.Return in 6 months. Will continue with the medications.

## 2023-03-08 NOTE — Nursing Note (Signed)
03/08/23 1511   Recent Weight Change   Have you had a recent unexplained weight loss or gain? N   Domestic Violence   Because we are aware of abuse and domestic violence today, we ask all patients: Are you being hurt, hit, or frightened by anyone at your home or in your life?  N   Basic Needs   Do you have any basic needs within your home that are not being met? (such as Food, Shelter, [employer] fast streamer, Tranportation, paying for bills and/or medications) N

## 2023-03-08 NOTE — Patient Instructions (Addendum)
Metamucil or off brand fiber supplement, whichever is cheaper or has acceptable taste. Mix one tablespoon in 1 cup of water.   You should also drink at least 5-6 cups of water or other unsweetened beverage a day.  Exercise as able    Some people can have increase gas (flatulence). Your body may need time to get used to the increased fiber intake. If it doesn't improve in 2 would try half tablespoon each morning.      It was a pleasure taking care of you today!  Your team today was Nurse Verlon Au and Dr. Wilmon Pali.  Please keep your follow-up appointment if we scheduled one.   If we ordered any labs or investigations, we will call you or message you with the results in 4-5 days, or sooner if there is something urgent.  If you don't hear back from [place] in a week, please call the clinic or message [place] on MyChart.  There is a provider on call every day of the week, but please refrain from calling outside office hours with non-urgent questions.  You can also message [place] for refills and referrals.  Our fax is: [phone]    I highly recommend you register on MyChart, if you haven't done so already. There you can request medication refills, appointments, your lab results, and referrals.    If you require help with food, transportation, housing, paying bills, and more, please visit [email or web address] and you will see a link at the bottom of the page titled "FindHelp".

## 2023-03-08 NOTE — Assessment & Plan Note (Signed)
Below goal  Decrease lisinopril from 40 to 30   Continue with metoprolol  RTC 3-6 months

## 2023-03-31 ENCOUNTER — Other Ambulatory Visit (HOSPITAL_COMMUNITY)
Admit: 2023-03-31 | Discharge: 2023-03-31 | Disposition: A | Discharge status: Home / Self Care | Payer: Self-pay | Admitting: Internal Medicine

## 2023-04-01 ENCOUNTER — Other Ambulatory Visit (HOSPITAL_COMMUNITY): Payer: Self-pay | Admitting: Family Medicine

## 2023-04-01 ENCOUNTER — Other Ambulatory Visit (HOSPITAL_COMMUNITY): Payer: Self-pay | Admitting: Internal Medicine

## 2023-04-01 LAB — CBC
HCT: 25 % — ABNORMAL LOW (ref 38.9–52.0)
HGB: 8.3 g/dL — ABNORMAL LOW (ref 13.4–17.5)
MCH: 30.1 pg (ref 26.0–32.0)
MCHC: 33.2 g/dL (ref 31.0–35.5)
MCV: 90.6 fL (ref 78.0–100.0)
MPV: 9.8 fL (ref 8.7–12.5)
PLATELETS: 128 10*3/uL — ABNORMAL LOW (ref 150–400)
RBC: 2.76 10*6/uL — ABNORMAL LOW (ref 4.50–6.10)
RDW-CV: 15.7 % — ABNORMAL HIGH (ref 11.5–15.5)
WBC: 6.2 10*3/uL (ref 3.7–11.0)

## 2023-04-01 LAB — COMPREHENSIVE METABOLIC PANEL, NON-FASTING
ALBUMIN: 2.8 g/dL — ABNORMAL LOW (ref 3.4–4.8)
ALKALINE PHOSPHATASE: 66 U/L (ref 45–115)
ALT (SGPT): 22 U/L (ref 10–55)
ANION GAP: 4 mmol/L (ref 4–13)
AST (SGOT): 19 U/L (ref 8–45)
BILIRUBIN TOTAL: 0.8 mg/dL (ref 0.3–1.3)
BUN/CREA RATIO: 31 — ABNORMAL HIGH (ref 6–22)
BUN: 19 mg/dL (ref 8–25)
CALCIUM: 9.5 mg/dL (ref 8.6–10.3)
CHLORIDE: 108 mmol/L (ref 96–111)
CO2 TOTAL: 25 mmol/L (ref 23–31)
CREATININE: 0.62 mg/dL — ABNORMAL LOW (ref 0.75–1.35)
ESTIMATED GFR - MALE: >90 mL/min/BSA (ref >=60)
GLUCOSE: 95 mg/dL (ref 65–125)
POTASSIUM: 3.7 mmol/L (ref 3.5–5.1)
PROTEIN TOTAL: 4.8 g/dL — ABNORMAL LOW (ref 6.0–8.0)
SODIUM: 137 mmol/L (ref 136–145)

## 2023-04-03 ENCOUNTER — Other Ambulatory Visit (HOSPITAL_COMMUNITY): Payer: Self-pay | Admitting: Family Medicine

## 2023-04-03 LAB — CBC/DIFF - CLIENT CONSOLIDATED
BASOPHIL #: <0.1 10*3/uL (ref <=0.20)
BASOPHIL %: 0 %
EOSINOPHIL #: <0.1 10*3/uL (ref <=0.50)
EOSINOPHIL %: 0 %
HCT: 29.2 % — ABNORMAL LOW (ref 38.9–52.0)
HGB: 9.3 g/dL — ABNORMAL LOW (ref 13.4–17.5)
LYMPHOCYTE #: 0.79 10*3/uL — ABNORMAL LOW (ref 1.00–4.80)
LYMPHOCYTE %: 10 %
MCH: 29.6 pg (ref 26.0–32.0)
MCHC: 31.8 g/dL (ref 31.0–35.5)
MCV: 93 fL (ref 78.0–100.0)
MONOCYTE #: 0.24 10*3/uL (ref 0.20–1.10)
MONOCYTE %: 3 %
MPV: 9.4 fL (ref 8.7–12.5)
NEUTROPHIL #: 6.87 10*3/uL (ref 1.50–7.70)
NEUTROPHIL %: 87 %
PLATELETS: 164 10*3/uL (ref 150–400)
RBC MORPHOLOGY: NORMAL
RBC: 3.14 10*6/uL — ABNORMAL LOW (ref 4.50–6.10)
RDW-CV: 15.9 % — ABNORMAL HIGH (ref 11.5–15.5)
WBC: 7.9 10*3/uL (ref 3.7–11.0)

## 2023-04-03 LAB — COMPREHENSIVE METABOLIC PANEL, NON-FASTING
ALBUMIN: 2.8 g/dL — ABNORMAL LOW (ref 3.4–4.8)
ALKALINE PHOSPHATASE: 72 U/L (ref 45–115)
ALT (SGPT): 38 U/L (ref 10–55)
ANION GAP: 6 mmol/L (ref 4–13)
AST (SGOT): 18 U/L (ref 8–45)
BILIRUBIN TOTAL: 1 mg/dL (ref 0.3–1.3)
BUN/CREA RATIO: 35 — ABNORMAL HIGH (ref 6–22)
BUN: 21 mg/dL (ref 8–25)
CALCIUM: 8.9 mg/dL (ref 8.6–10.3)
CHLORIDE: 107 mmol/L (ref 96–111)
CO2 TOTAL: 25 mmol/L (ref 23–31)
CREATININE: 0.6 mg/dL — ABNORMAL LOW (ref 0.75–1.35)
ESTIMATED GFR - MALE: >90 mL/min/BSA (ref >=60)
GLUCOSE: 100 mg/dL (ref 65–125)
POTASSIUM: 4.3 mmol/L (ref 3.5–5.1)
PROTEIN TOTAL: 4.7 g/dL — ABNORMAL LOW (ref 6.0–8.0)
SODIUM: 138 mmol/L (ref 136–145)

## 2023-04-03 LAB — MANUAL DIFF AND MORPHOLOGY-SYSMEX
BASOPHIL #: <0.1 10*3/uL (ref <=0.20)
BASOPHIL %: 0 %
EOSINOPHIL #: <0.1 10*3/uL (ref <=0.50)
EOSINOPHIL %: 0 %
LYMPHOCYTE #: 0.79 10*3/uL — ABNORMAL LOW (ref 1.00–4.80)
LYMPHOCYTE %: 10 %
MONOCYTE #: 0.24 10*3/uL (ref 0.20–1.10)
MONOCYTE %: 3 %
NEUTROPHIL #: 6.87 10*3/uL (ref 1.50–7.70)
NEUTROPHIL %: 87 %
RBC MORPHOLOGY: NORMAL

## 2023-04-03 LAB — CBC WITH DIFF
HCT: 29.2 % — ABNORMAL LOW (ref 38.9–52.0)
HGB: 9.3 g/dL — ABNORMAL LOW (ref 13.4–17.5)
MCH: 29.6 pg (ref 26.0–32.0)
MCHC: 31.8 g/dL (ref 31.0–35.5)
MCV: 93 fL (ref 78.0–100.0)
MPV: 9.4 fL (ref 8.7–12.5)
PLATELETS: 164 10*3/uL (ref 150–400)
RBC: 3.14 10*6/uL — ABNORMAL LOW (ref 4.50–6.10)
RDW-CV: 15.9 % — ABNORMAL HIGH (ref 11.5–15.5)
WBC: 7.9 10*3/uL (ref 3.7–11.0)

## 2023-04-03 NOTE — Result Encounter Note (Signed)
All results normal. Some of the values can appear to be abnormal but still be within acceptable range for you.

## 2023-04-03 NOTE — Result Encounter Note (Signed)
Albumin on lower side, inquire about appetite and weight loss

## 2023-04-07 ENCOUNTER — Encounter (INDEPENDENT_AMBULATORY_CARE_PROVIDER_SITE_OTHER): Payer: Self-pay

## 2023-04-08 ENCOUNTER — Other Ambulatory Visit (HOSPITAL_COMMUNITY): Payer: Self-pay | Admitting: Internal Medicine

## 2023-04-08 LAB — CBC/DIFF - CLIENT CONSOLIDATED
BASOPHIL #: <0.1 10*3/uL (ref <=0.20)
BASOPHIL %: 0.1 %
EOSINOPHIL #: <0.1 10*3/uL (ref <=0.50)
EOSINOPHIL %: 0.3 %
HCT: 28.3 % — ABNORMAL LOW (ref 38.9–52.0)
HGB: 9.4 g/dL — ABNORMAL LOW (ref 13.4–17.5)
IMMATURE GRANULOCYTE #: 0.22 10*3/uL — ABNORMAL HIGH (ref <0.10)
IMMATURE GRANULOCYTE %: 2.1 % — ABNORMAL HIGH (ref 0.0–1.0)
LYMPHOCYTE #: 0.7 10*3/uL — ABNORMAL LOW (ref 1.00–4.80)
LYMPHOCYTE %: 6.8 %
MCH: 29.9 pg (ref 26.0–32.0)
MCHC: 33.2 g/dL (ref 31.0–35.5)
MCV: 90.1 fL (ref 78.0–100.0)
MONOCYTE #: 0.91 10*3/uL (ref 0.20–1.10)
MONOCYTE %: 8.8 %
MPV: 9.2 fL (ref 8.7–12.5)
NEUTROPHIL #: 8.43 10*3/uL — ABNORMAL HIGH (ref 1.50–7.70)
NEUTROPHIL %: 81.9 %
PLATELETS: 164 10*3/uL (ref 150–400)
RBC: 3.14 10*6/uL — ABNORMAL LOW (ref 4.50–6.10)
RDW-CV: 15.9 % — ABNORMAL HIGH (ref 11.5–15.5)
WBC: 10.3 10*3/uL (ref 3.7–11.0)

## 2023-04-08 LAB — CBC WITH DIFF
BASOPHIL #: <0.1 10*3/uL (ref <=0.20)
BASOPHIL %: 0.1 %
EOSINOPHIL #: <0.1 10*3/uL (ref <=0.50)
EOSINOPHIL %: 0.3 %
HCT: 28.3 % — ABNORMAL LOW (ref 38.9–52.0)
HGB: 9.4 g/dL — ABNORMAL LOW (ref 13.4–17.5)
IMMATURE GRANULOCYTE #: 0.22 10*3/uL — ABNORMAL HIGH (ref <0.10)
IMMATURE GRANULOCYTE %: 2.1 % — ABNORMAL HIGH (ref 0.0–1.0)
LYMPHOCYTE #: 0.7 10*3/uL — ABNORMAL LOW (ref 1.00–4.80)
LYMPHOCYTE %: 6.8 %
MCH: 29.9 pg (ref 26.0–32.0)
MCHC: 33.2 g/dL (ref 31.0–35.5)
MCV: 90.1 fL (ref 78.0–100.0)
MONOCYTE #: 0.91 10*3/uL (ref 0.20–1.10)
MONOCYTE %: 8.8 %
MPV: 9.2 fL (ref 8.7–12.5)
NEUTROPHIL #: 8.43 10*3/uL — ABNORMAL HIGH (ref 1.50–7.70)
NEUTROPHIL %: 81.9 %
PLATELETS: 164 10*3/uL (ref 150–400)
RBC: 3.14 10*6/uL — ABNORMAL LOW (ref 4.50–6.10)
RDW-CV: 15.9 % — ABNORMAL HIGH (ref 11.5–15.5)
WBC: 10.3 10*3/uL (ref 3.7–11.0)

## 2023-04-08 LAB — BASIC METABOLIC PANEL
ANION GAP: 4 mmol/L (ref 4–13)
BUN/CREA RATIO: 44 — ABNORMAL HIGH (ref 6–22)
BUN: 30 mg/dL — ABNORMAL HIGH (ref 8–25)
CALCIUM: 8.7 mg/dL (ref 8.6–10.3)
CHLORIDE: 101 mmol/L (ref 96–111)
CO2 TOTAL: 25 mmol/L (ref 23–31)
CREATININE: 0.68 mg/dL — ABNORMAL LOW (ref 0.75–1.35)
ESTIMATED GFR - MALE: >90 mL/min/BSA (ref >=60)
GLUCOSE: 89 mg/dL (ref 65–125)
POTASSIUM: 4.9 mmol/L (ref 3.5–5.1)
SODIUM: 130 mmol/L — ABNORMAL LOW (ref 136–145)

## 2023-04-10 ENCOUNTER — Other Ambulatory Visit (HOSPITAL_COMMUNITY): Payer: Self-pay | Admitting: Family Medicine

## 2023-04-10 LAB — BASIC METABOLIC PANEL, FASTING
ANION GAP: 5 mmol/L (ref 4–13)
BUN/CREA RATIO: 20 (ref 6–22)
BUN: 14 mg/dL (ref 8–25)
CALCIUM: 8.2 mg/dL — ABNORMAL LOW (ref 8.6–10.3)
CHLORIDE: 103 mmol/L (ref 96–111)
CO2 TOTAL: 21 mmol/L — ABNORMAL LOW (ref 23–31)
CREATININE: 0.71 mg/dL — ABNORMAL LOW (ref 0.75–1.35)
ESTIMATED GFR - MALE: >90 mL/min/BSA (ref >=60)
GLUCOSE: 91 mg/dL (ref 70–99)
POTASSIUM: 4.4 mmol/L (ref 3.5–5.1)
SODIUM: 129 mmol/L — ABNORMAL LOW (ref 136–145)

## 2023-04-10 LAB — CBC/DIFF - CLIENT CONSOLIDATED
BASOPHIL #: <0.1 10*3/uL (ref <=0.20)
BASOPHIL %: 0.2 %
EOSINOPHIL #: <0.1 10*3/uL (ref <=0.50)
EOSINOPHIL %: 0.9 %
HCT: 25.6 % — ABNORMAL LOW (ref 38.9–52.0)
HGB: 8.5 g/dL — ABNORMAL LOW (ref 13.4–17.5)
IMMATURE GRANULOCYTE #: 0.11 10*3/uL — ABNORMAL HIGH (ref <0.10)
IMMATURE GRANULOCYTE %: 1.2 % — ABNORMAL HIGH (ref 0.0–1.0)
LYMPHOCYTE #: 0.74 10*3/uL — ABNORMAL LOW (ref 1.00–4.80)
LYMPHOCYTE %: 7.9 %
MCH: 29.9 pg (ref 26.0–32.0)
MCHC: 33.2 g/dL (ref 31.0–35.5)
MCV: 90.1 fL (ref 78.0–100.0)
MONOCYTE #: 0.87 10*3/uL (ref 0.20–1.10)
MONOCYTE %: 9.3 %
MPV: 9.3 fL (ref 8.7–12.5)
NEUTROPHIL #: 7.57 10*3/uL (ref 1.50–7.70)
NEUTROPHIL %: 80.5 %
PLATELETS: 153 10*3/uL (ref 150–400)
RBC: 2.84 10*6/uL — ABNORMAL LOW (ref 4.50–6.10)
RDW-CV: 15.6 % — ABNORMAL HIGH (ref 11.5–15.5)
WBC: 9.4 10*3/uL (ref 3.7–11.0)

## 2023-04-10 LAB — CBC WITH DIFF
BASOPHIL #: <0.1 10*3/uL (ref <=0.20)
BASOPHIL %: 0.2 %
EOSINOPHIL #: <0.1 10*3/uL (ref <=0.50)
EOSINOPHIL %: 0.9 %
HCT: 25.6 % — ABNORMAL LOW (ref 38.9–52.0)
HGB: 8.5 g/dL — ABNORMAL LOW (ref 13.4–17.5)
IMMATURE GRANULOCYTE #: 0.11 10*3/uL — ABNORMAL HIGH (ref <0.10)
IMMATURE GRANULOCYTE %: 1.2 % — ABNORMAL HIGH (ref 0.0–1.0)
LYMPHOCYTE #: 0.74 10*3/uL — ABNORMAL LOW (ref 1.00–4.80)
LYMPHOCYTE %: 7.9 %
MCH: 29.9 pg (ref 26.0–32.0)
MCHC: 33.2 g/dL (ref 31.0–35.5)
MCV: 90.1 fL (ref 78.0–100.0)
MONOCYTE #: 0.87 10*3/uL (ref 0.20–1.10)
MONOCYTE %: 9.3 %
MPV: 9.3 fL (ref 8.7–12.5)
NEUTROPHIL #: 7.57 10*3/uL (ref 1.50–7.70)
NEUTROPHIL %: 80.5 %
PLATELETS: 153 10*3/uL (ref 150–400)
RBC: 2.84 10*6/uL — ABNORMAL LOW (ref 4.50–6.10)
RDW-CV: 15.6 % — ABNORMAL HIGH (ref 11.5–15.5)
WBC: 9.4 10*3/uL (ref 3.7–11.0)

## 2023-04-10 NOTE — Result Encounter Note (Signed)
Ordered by Dr. Colman Cater, let's repeat in a week, make sure non fasting

## 2023-04-10 NOTE — Result Encounter Note (Signed)
Still anemic in the setting of positive Cologuard 12/2022, already requested he be referred to gastroenterology  Please refer again for CRC screening  BUN elevated either dehydration or GIB

## 2023-04-11 ENCOUNTER — Telehealth (INDEPENDENT_AMBULATORY_CARE_PROVIDER_SITE_OTHER): Payer: Self-pay | Admitting: Student in an Organized Health Care Education/Training Program

## 2023-04-11 NOTE — Telephone Encounter (Signed)
TOC NEEDS COMPLETED    [facility] D/C 10-17    APPT 10-24 [facility]    TOC NEEDS DONE BY 10-21    Medical records have been requested from the discharging facility.

## 2023-04-13 ENCOUNTER — Other Ambulatory Visit (HOSPITAL_COMMUNITY): Payer: Self-pay | Admitting: Internal Medicine

## 2023-04-13 LAB — CBC WITH DIFF
BASOPHIL #: <0.1 10*3/uL (ref <=0.20)
BASOPHIL %: 0.3 %
EOSINOPHIL #: <0.1 10*3/uL (ref <=0.50)
EOSINOPHIL %: 0.9 %
HCT: 23.9 % — ABNORMAL LOW (ref 38.9–52.0)
HGB: 7.6 g/dL — ABNORMAL LOW (ref 13.4–17.5)
IMMATURE GRANULOCYTE #: <0.1 10*3/uL (ref <0.10)
IMMATURE GRANULOCYTE %: 0.5 % (ref 0.0–1.0)
LYMPHOCYTE #: 0.87 10*3/uL — ABNORMAL LOW (ref 1.00–4.80)
LYMPHOCYTE %: 15.1 %
MCH: 29.3 pg (ref 26.0–32.0)
MCHC: 31.8 g/dL (ref 31.0–35.5)
MCV: 92.3 fL (ref 78.0–100.0)
MONOCYTE #: 0.54 10*3/uL (ref 0.20–1.10)
MONOCYTE %: 9.4 %
MPV: 8.9 fL (ref 8.7–12.5)
NEUTROPHIL #: 4.26 10*3/uL (ref 1.50–7.70)
NEUTROPHIL %: 73.8 %
PLATELETS: 163 10*3/uL (ref 150–400)
RBC: 2.59 10*6/uL — ABNORMAL LOW (ref 4.50–6.10)
RDW-CV: 15.6 % — ABNORMAL HIGH (ref 11.5–15.5)
WBC: 5.8 10*3/uL (ref 3.7–11.0)

## 2023-04-13 LAB — BASIC METABOLIC PANEL
ANION GAP: 8 mmol/L (ref 4–13)
BUN/CREA RATIO: 21 (ref 6–22)
BUN: 13 mg/dL (ref 8–25)
CALCIUM: 8.3 mg/dL — ABNORMAL LOW (ref 8.6–10.3)
CHLORIDE: 107 mmol/L (ref 96–111)
CO2 TOTAL: 23 mmol/L (ref 23–31)
CREATININE: 0.61 mg/dL — ABNORMAL LOW (ref 0.75–1.35)
ESTIMATED GFR - MALE: >90 mL/min/BSA (ref >=60)
GLUCOSE: 83 mg/dL (ref 65–125)
POTASSIUM: 3.9 mmol/L (ref 3.5–5.1)
SODIUM: 138 mmol/L (ref 136–145)

## 2023-04-13 LAB — CBC/DIFF - CLIENT CONSOLIDATED
BASOPHIL #: <0.1 10*3/uL (ref <=0.20)
BASOPHIL %: 0.3 %
EOSINOPHIL #: <0.1 10*3/uL (ref <=0.50)
EOSINOPHIL %: 0.9 %
HCT: 23.9 % — ABNORMAL LOW (ref 38.9–52.0)
HGB: 7.6 g/dL — ABNORMAL LOW (ref 13.4–17.5)
IMMATURE GRANULOCYTE #: <0.1 10*3/uL (ref <0.10)
IMMATURE GRANULOCYTE %: 0.5 % (ref 0.0–1.0)
LYMPHOCYTE #: 0.87 10*3/uL — ABNORMAL LOW (ref 1.00–4.80)
LYMPHOCYTE %: 15.1 %
MCH: 29.3 pg (ref 26.0–32.0)
MCHC: 31.8 g/dL (ref 31.0–35.5)
MCV: 92.3 fL (ref 78.0–100.0)
MONOCYTE #: 0.54 10*3/uL (ref 0.20–1.10)
MONOCYTE %: 9.4 %
MPV: 8.9 fL (ref 8.7–12.5)
NEUTROPHIL #: 4.26 10*3/uL (ref 1.50–7.70)
NEUTROPHIL %: 73.8 %
PLATELETS: 163 10*3/uL (ref 150–400)
RBC: 2.59 10*6/uL — ABNORMAL LOW (ref 4.50–6.10)
RDW-CV: 15.6 % — ABNORMAL HIGH (ref 11.5–15.5)
WBC: 5.8 10*3/uL (ref 3.7–11.0)

## 2023-04-13 NOTE — Result Encounter Note (Signed)
All results normal. Some of the values can appear to be abnormal but still be within acceptable range for you.

## 2023-04-14 NOTE — Telephone Encounter (Signed)
First attempt TOC.    Left voicemail requesting patient to call back. Will also discuss lab results. Berton Bon, RN 04/14/2023

## 2023-04-17 ENCOUNTER — Other Ambulatory Visit (HOSPITAL_COMMUNITY): Payer: Self-pay | Admitting: Family Medicine

## 2023-04-17 ENCOUNTER — Other Ambulatory Visit (HOSPITAL_COMMUNITY): Payer: Self-pay | Admitting: Internal Medicine

## 2023-04-17 LAB — CBC WITH DIFF
BASOPHIL #: <0.1 10*3/uL (ref <=0.20)
BASOPHIL %: 0.3 %
EOSINOPHIL #: 0.13 10*3/uL (ref <=0.50)
EOSINOPHIL %: 2.2 %
HCT: 25.5 % — ABNORMAL LOW (ref 38.9–52.0)
HGB: 8.3 g/dL — ABNORMAL LOW (ref 13.4–17.5)
IMMATURE GRANULOCYTE #: <0.1 10*3/uL (ref <0.10)
IMMATURE GRANULOCYTE %: 0.5 % (ref 0.0–1.0)
LYMPHOCYTE #: 0.79 10*3/uL — ABNORMAL LOW (ref 1.00–4.80)
LYMPHOCYTE %: 13.2 %
MCH: 28.9 pg (ref 26.0–32.0)
MCHC: 32.5 g/dL (ref 31.0–35.5)
MCV: 88.9 fL (ref 78.0–100.0)
MONOCYTE #: 0.48 10*3/uL (ref 0.20–1.10)
MONOCYTE %: 8 %
MPV: 8.6 fL — ABNORMAL LOW (ref 8.7–12.5)
NEUTROPHIL #: 4.52 10*3/uL (ref 1.50–7.70)
NEUTROPHIL %: 75.8 %
PLATELETS: 172 10*3/uL (ref 150–400)
RBC: 2.87 10*6/uL — ABNORMAL LOW (ref 4.50–6.10)
RDW-CV: 14.9 % (ref 11.5–15.5)
WBC: 6 10*3/uL (ref 3.7–11.0)

## 2023-04-17 LAB — CBC/DIFF - CLIENT CONSOLIDATED
BASOPHIL #: <0.1 10*3/uL (ref <=0.20)
BASOPHIL %: 0.3 %
EOSINOPHIL #: 0.13 10*3/uL (ref <=0.50)
EOSINOPHIL %: 2.2 %
HCT: 25.5 % — ABNORMAL LOW (ref 38.9–52.0)
HGB: 8.3 g/dL — ABNORMAL LOW (ref 13.4–17.5)
IMMATURE GRANULOCYTE #: <0.1 10*3/uL (ref <0.10)
IMMATURE GRANULOCYTE %: 0.5 % (ref 0.0–1.0)
LYMPHOCYTE #: 0.79 10*3/uL — ABNORMAL LOW (ref 1.00–4.80)
LYMPHOCYTE %: 13.2 %
MCH: 28.9 pg (ref 26.0–32.0)
MCHC: 32.5 g/dL (ref 31.0–35.5)
MCV: 88.9 fL (ref 78.0–100.0)
MONOCYTE #: 0.48 10*3/uL (ref 0.20–1.10)
MONOCYTE %: 8 %
MPV: 8.6 fL — ABNORMAL LOW (ref 8.7–12.5)
NEUTROPHIL #: 4.52 10*3/uL (ref 1.50–7.70)
NEUTROPHIL %: 75.8 %
PLATELETS: 172 10*3/uL (ref 150–400)
RBC: 2.87 10*6/uL — ABNORMAL LOW (ref 4.50–6.10)
RDW-CV: 14.9 % (ref 11.5–15.5)
WBC: 6 10*3/uL (ref 3.7–11.0)

## 2023-04-17 LAB — URINALYSIS, MACROSCOPIC
BILIRUBIN: NOT DETECTED mg/dL
BLOOD: NOT DETECTED mg/dL
GLUCOSE: NOT DETECTED mg/dL
KETONES: NOT DETECTED mg/dL
LEUKOCYTES: 75 WBCs/uL — AB
NITRITE: NOT DETECTED
PH: 7.5 (ref 5.0–?)
PROTEIN: NOT DETECTED mg/dL
SPECIFIC GRAVITY: 1.004 — ABNORMAL LOW (ref 1.005–?)
UROBILINOGEN: NOT DETECTED mg/dL

## 2023-04-17 LAB — BASIC METABOLIC PANEL, FASTING
ANION GAP: 8 mmol/L (ref 4–13)
BUN/CREA RATIO: 18 (ref 6–22)
BUN: 12 mg/dL (ref 8–25)
CALCIUM: 8.5 mg/dL — ABNORMAL LOW (ref 8.6–10.3)
CHLORIDE: 103 mmol/L (ref 96–111)
CO2 TOTAL: 24 mmol/L (ref 23–31)
CREATININE: 0.66 mg/dL — ABNORMAL LOW (ref 0.75–1.35)
ESTIMATED GFR - MALE: >90 mL/min/BSA (ref >=60)
GLUCOSE: 87 mg/dL (ref 70–99)
POTASSIUM: 4.3 mmol/L (ref 3.5–5.1)
SODIUM: 135 mmol/L — ABNORMAL LOW (ref 136–145)

## 2023-04-17 LAB — URINALYSIS, MACROSCOPIC AND MICROSCOPIC W/CULTURE REFLEX - CLIENT CONSOLIDATED
BILIRUBIN: NOT DETECTED mg/dL
BLOOD: NOT DETECTED mg/dL
GLUCOSE: NOT DETECTED mg/dL
KETONES: NOT DETECTED mg/dL
LEUKOCYTES: 75 WBCs/uL — AB
NITRITE: NOT DETECTED
PH: 7.5 (ref 5.0–?)
PROTEIN: NOT DETECTED mg/dL
SPECIFIC GRAVITY: 1.004 — ABNORMAL LOW (ref 1.005–?)
UROBILINOGEN: NOT DETECTED mg/dL

## 2023-04-17 LAB — URINALYSIS, MICROSCOPIC

## 2023-04-18 NOTE — Telephone Encounter (Signed)
Pt is currently still admitted to [facility]. Donne Hazel, RN

## 2023-04-19 LAB — URINE CULTURE,ROUTINE: URINE CULTURE: >100000 — AB

## 2023-04-19 NOTE — Result Encounter Note (Signed)
Complicated UTI w/E coli  Let's start ciprofloxacin500 mg twice daily x 5 - 7 days

## 2023-04-20 ENCOUNTER — Encounter (INDEPENDENT_AMBULATORY_CARE_PROVIDER_SITE_OTHER): Payer: Self-pay | Admitting: Family

## 2023-04-20 ENCOUNTER — Other Ambulatory Visit: Payer: Self-pay

## 2023-04-20 ENCOUNTER — Ambulatory Visit: Payer: Medicare Other | Attending: Family | Admitting: Family

## 2023-04-20 ENCOUNTER — Telehealth (INDEPENDENT_AMBULATORY_CARE_PROVIDER_SITE_OTHER): Payer: Self-pay | Admitting: Student in an Organized Health Care Education/Training Program

## 2023-04-20 VITALS — BP 122/62 | HR 98 | Resp 16 | Ht 69.0 in | Wt 205.0 lb

## 2023-04-20 DIAGNOSIS — R339 Retention of urine, unspecified: Secondary | ICD-10-CM | POA: Insufficient documentation

## 2023-04-20 DIAGNOSIS — Z09 Encounter for follow-up examination after completed treatment for conditions other than malignant neoplasm: Secondary | ICD-10-CM | POA: Insufficient documentation

## 2023-04-20 DIAGNOSIS — Z23 Encounter for immunization: Secondary | ICD-10-CM | POA: Insufficient documentation

## 2023-04-20 DIAGNOSIS — N390 Urinary tract infection, site not specified: Secondary | ICD-10-CM | POA: Insufficient documentation

## 2023-04-20 DIAGNOSIS — M62838 Other muscle spasm: Secondary | ICD-10-CM | POA: Insufficient documentation

## 2023-04-20 DIAGNOSIS — M4800 Spinal stenosis, site unspecified: Secondary | ICD-10-CM | POA: Insufficient documentation

## 2023-04-20 NOTE — Telephone Encounter (Signed)
Transition of Care Contact Information  Discharge Date: 04/19/2023  Transition Facility Type--Skilled Nursing Facility  Facility Name--[facility]  Interactive Contact(s): Completed or attempted contact indicated by Date/Time  Completed Contact: 04/20/2023 11:51 AM  Contact Method(s)-- Patient/Caregiver Telephone  Clinical Staff Name/Role who Dalia Heading, RN  Transition Assessment  Discharge Summary obtained?--Yes  How are you recovering?--Improving  Discharge Meds obtained?--No  Medication Barriers  --Patient has not been to pharmacy yet--   Discharge medication changes reviewed?--Yes  Full Medication Reconciliation Completed?--Yes  Medication understanding --knows new medication(s)  Medication Concerns?--No  Have everything needed for recovery?--Yes  Care Coordination:   Patient has transition follow-up appointment date and time?--Yes  Follow up appointment date:--04/20/2023  Specialist Transition Visit planned?  Specialist Transition Visit date:  Patient/caregiver plans to attend transition visit?  Primary Follow-up Barrier  Interventions provided --reinforced discharge instructions  Home Health or DME ordered at discharge?  Clinician/Team notified?  Primary reason clinician notified?  Transition Note:Called and spoke with pt about UA results and TOC. Pt states that he was admitted to [facility] after surgery. Pt states that he is still having issues with mobility and his right leg. States that his "neuro" called in an anti-spasm medication to the pharmacy to help. He hasn't picked it up yet, but will do so when out today. Pt was ordered [facility] and they called for intake today, but pt will be at our office so they are calling tomorrow. Pt also expressed concern about having a foley catheter. Pt states that it was placed his first week in pt d/t being unable to void. Pt states that his catheter has been placed for approx 3 weeks and won't be able to see [facility] until  10/31. I discussed with pt that we did receive a UA from when he was admitted that showed a possibility of an infection and asked if he was treated. He stated that he didn't get any new medication other that what "the neuro dr" was sending in. I asked for clarification from pt that UA was collected by the tubing or by his collection bag. Pt states that nothing was ever taken from him tubing, all was from the bag. I encouraged pt to keep appt today at 3 with LT, NP. Pt v/u. Donne Hazel, RN             Donne Hazel, RN  04/20/2023, 11:57

## 2023-04-20 NOTE — Telephone Encounter (Signed)
Noted! Thank you

## 2023-04-20 NOTE — Telephone Encounter (Signed)
I informed LT of UA collection of previous sample given since it potentially taken from the collection bag. Will discuss at appt today with pt. Donne Hazel, RN

## 2023-04-20 NOTE — Progress Notes (Signed)
PRIMARY CARE, [facility]- [facility]  [address] [place] [address]    Progress Note         NAME:  Gary White  DOB:  1945-11-02  AGE:  77 y.o.  MRN:  [id number]  Encounter:        04/20/2023  PCP:                 Wilmon Pali, MD       Chief complaint:   Chief Complaint   Patient presents with    Hospital Discharge Transition     Pt states has follow up appt scheduled with surgeon Dr. Sandy Salaam on November 11th. Pt has follow up with urology at [facility] 10/31.   Pt has no concerns. Pt states home health will be assessing him tomorrow.        Subjective:     History of Present Illness:  Gary White is a 77 y.o. male who is being seen today for hospital discharge follow up.    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 Patient was transferred to [facility] for rehabilitation. He received DVT prophylaxis, Flexeril for muscle spasms, and oxycodone for pain management. Required maximal assistance for bed mobility and basic ADLs, moderate assistance for transfers, and dependent for ambulation with LSO brace. Prior to surgery patient was independent with a walker. Developed urinary retention during admission. Foley catheter was placed with plan for Urology follow up outpatient.    Present today with son stating that he continues to have muscle spasms in his right leg. He states rehab went well for the first week and he was able to stand using parallel bars but after that he was unable to stand due to spasms in his leg. He is taking Flexeril which does help a little bit. He had a follow up with Neurology yesterday and was prescribed a new medication for muscle spasms. He has not received the medication yet. He has not taken pain medication since discharge from rehab. He has an appointment scheduled with Neurosurgery and Urology. He is supposed to get a visit from home  health tomorrow.    Review of Systems: Pertinent positives noted in HPI.    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    HX TONSILLECTOMY      HX WISDOM TEETH EXTRACTION      LAMINECTOMY  03/2016    L4-5    REPLACEMENT TOTAL KNEE Right     2017 and revision in 2024 with Dr Katherene Ponto at [facility]         Family Medical History:  Problem Relation (Age of Onset)    Cancer Mother    Heart Disease Father    Macular Degen Mother            Social History     Socioeconomic History    Marital status: Divorced   Tobacco Use    Smoking status: Former     Types: Cigarettes    Smokeless tobacco: Never   Vaping Use    Vaping status: Never Used   Substance and Sexual Activity    Alcohol use: Not Currently    Drug use: Not Currently     Types: Marijuana     Comment: medical marijuana card   Other Topics Concern    Ability to Walk 1 Flight of Steps without SOB/CP No    Ability To Do Own ADL's Yes     Social Determinants of Health     Financial Resource Strain: Low Risk  (05/23/2018)    Received from [facility]  (prior to 12/11/2021), [facility]  (prior to 12/11/2021)    Overall Financial Resource Strain (CARDIA)     Difficulty of Paying Living Expenses: Not hard at all   Transportation Needs: No Transportation Needs (05/23/2018)    Received from [facility]  (prior to 12/11/2021), [facility]  (prior to 12/11/2021)    PRAPARE - Therapist, art (Medical): No     Lack of  Transportation (Non-Medical): No   Social Connections: Low Risk  (11/09/2022)    Social Connections     SDOH Social Isolation: 5 or more times a week     No Known Allergies    Current Outpatient Medications   Medication Sig    acetaminophen (TYLENOL 8 HOUR) 650 mg Oral Tablet Sustained Release Take 1 Tablet (650 mg total) by mouth Every 8 hours as needed    atorvastatin (LIPITOR) 20 mg Oral Tablet Take 1 Tablet (20 mg total) by mouth Once a day    cyanocobalamin, vitamin B-12, 5,000 mcg Oral Capsule 3 capsule DAILY (route: oral)    cyclobenzaprine (FLEXERIL) 10 mg Oral Tablet TAKE 1 TABLET BY MOUTH THREE TIMES DAILY    finasteride (PROSCAR) 5 mg Oral Tablet Take 1 Tablet (5 mg total) by mouth Once a day    gabapentin (NEURONTIN) 300 mg Oral Capsule Take 1 Capsule (300 mg total) by mouth Four times a day    lisinopriL (PRINIVIL) 30 mg Oral Tablet Take 1 Tablet (30 mg total) by mouth Once a day for 180 days    metoprolol tartrate (LOPRESSOR) 25 mg Oral Tablet Take 1 Tablet (25 mg total) by mouth Twice daily    multivitamin with iron Oral Tablet Take 1 Tablet by mouth Once a day    ondansetron (ZOFRAN ODT) 4 mg Oral Tablet, Rapid Dissolve Take 1 Tablet (4 mg total) by mouth Every 8 hours as needed for Nausea/Vomiting    tamsulosin (FLOMAX) 0.4 mg Oral Capsule Take 1 Capsule (0.4 mg total) by mouth Once a day    triamcinolone acetonide (ARISTOCORT A) 0.1 % Ointment Apply topically Twice daily    vit C/vit E acet/lutein/min (OCUVITE LUTEIN ORAL) Take 1 Tablet by mouth Once a day       Objective:     Vitals:    04/20/23 1516   BP: 122/62   Pulse: 98   Resp: 16   SpO2: 98%   Weight: 93 kg (205 lb)  Height: 1.753 m (5\' 9" )   BMI: 30.34     Physical Exam  Vitals reviewed.   Constitutional:       Appearance: Normal appearance.   HENT:      Head: Normocephalic and atraumatic.   Cardiovascular:      Rate and Rhythm: Normal rate and regular rhythm.      Heart sounds: Normal heart sounds.   Pulmonary:      Effort: Pulmonary  effort is normal.      Breath sounds: Normal breath sounds.   Skin:     General: Skin is warm and dry.   Neurological:      Mental Status: He is alert.   Psychiatric:         Mood and Affect: Mood normal.         Behavior: Behavior normal.       Assessment/Plan     Hospital discharge follow-up  Discharge note reviewed. Follow up completed today. Discharge instructions and medications reviewed. Has contacted by home health. Patient is aware of specialty follow up dates and times.    Spinal stenosis  Stable at time of discharge. Follow up with Neurosurgery as scheduled. Contact Neurosurgery for sooner appointment if symptoms do not improve.    Muscle spasms of lower extremity  Uncontrolled at time of discharge. Continue medication as prescribed. Follow up with Neurology as scheduled. Contact Neurosurgery for no improvement.    Urinary retention  Stable at time of discharge. Follow up with Urology as scheduled.    Urinary tract infection  Unable to obtain sterile specimen during visit. UA order given to patient for home health.    Flu vaccine need  Counseling was provided to patient/parent on vaccinations/immunizations    Follow up: Return as needed and/or as scheduled.    Seek medical attention for new or worsening symptoms.    Patient has been seen in this clinic within the last 3 years.    Drue Stager, APRN    This note was partially created using MModal Fluency Direct system (voice recognition software) and is inherently subject to errors including those of syntax and "sound-alike" substitutions which may escape proofreading.  In such instances, original meaning may be extrapolated by contextual derivation.          Transition of Care Contact Information  Discharge date: Discharge Date: 04/19/2023  Transition Facility Type--Skilled Nursing Facility  Facility Name--[facility] Interactive Contact(s):  Completed Contact: 04/20/2023 11:51 AM  Contact Method(s)-- Patient/Caregiver  Telephone  Clinical Staff Name/Role who Dalia Heading, RN     Data Reviewed  Medication Reconciliation completed

## 2023-04-21 ENCOUNTER — Other Ambulatory Visit: Payer: Medicare Other | Attending: Family | Admitting: Family

## 2023-04-21 ENCOUNTER — Encounter (HOSPITAL_BASED_OUTPATIENT_CLINIC_OR_DEPARTMENT_OTHER): Payer: Medicare Other | Admitting: Student in an Organized Health Care Education/Training Program

## 2023-04-21 ENCOUNTER — Other Ambulatory Visit (INDEPENDENT_AMBULATORY_CARE_PROVIDER_SITE_OTHER): Payer: Self-pay | Admitting: Family

## 2023-04-21 DIAGNOSIS — T84022A Instability of internal right knee prosthesis, initial encounter: Secondary | ICD-10-CM

## 2023-04-21 DIAGNOSIS — D696 Thrombocytopenia, unspecified: Secondary | ICD-10-CM | POA: Insufficient documentation

## 2023-04-21 DIAGNOSIS — N390 Urinary tract infection, site not specified: Secondary | ICD-10-CM

## 2023-04-21 LAB — URINALYSIS, MICROSCOPIC

## 2023-04-21 LAB — URINALYSIS, MACROSCOPIC
BILIRUBIN: NOT DETECTED mg/dL
GLUCOSE: NOT DETECTED mg/dL
KETONES: NOT DETECTED mg/dL
LEUKOCYTES: 250 WBCs/uL — AB
NITRITE: NOT DETECTED
PH: 7.5 (ref 5.0–?)
SPECIFIC GRAVITY: 1.017 (ref 1.005–?)
UROBILINOGEN: 3 mg/dL — AB

## 2023-04-21 MED ORDER — NITROFURANTOIN MONOHYDRATE/MACROCRYSTALS 100 MG CAPSULE
100.0000 mg | ORAL_CAPSULE | Freq: Two times a day (BID) | ORAL | 0 refills | Status: AC
Start: 2023-04-21 — End: 2023-04-26

## 2023-04-21 NOTE — Nursing Note (Signed)
Home health plan of care reviewed, completed, and signed by provider.  See scanned document.  Mikey College, RN 04/21/2023 10:33

## 2023-04-23 LAB — URINE CULTURE: URINE CULTURE: >100000 — AB

## 2023-04-25 ENCOUNTER — Telehealth (INDEPENDENT_AMBULATORY_CARE_PROVIDER_SITE_OTHER): Payer: Self-pay | Admitting: Family

## 2023-04-25 DIAGNOSIS — N390 Urinary tract infection, site not specified: Secondary | ICD-10-CM

## 2023-04-25 NOTE — Telephone Encounter (Signed)
UA order entered, printed, and faxed to home health.   Hattie Perch, LPN

## 2023-04-25 NOTE — Telephone Encounter (Signed)
-----   Message from Drue Stager sent at 04/24/2023  8:50 PM EDT -----  Bacteria is sensitive to Macrobid. Continue antibiotic until completed. Please send order to home health for repeat UA a week after finishing antibiotic.

## 2023-05-03 ENCOUNTER — Other Ambulatory Visit: Payer: Medicare Other

## 2023-05-03 DIAGNOSIS — N390 Urinary tract infection, site not specified: Secondary | ICD-10-CM | POA: Insufficient documentation

## 2023-05-03 LAB — URINALYSIS, MACROSCOPIC
BILIRUBIN: NOT DETECTED mg/dL
BLOOD: NOT DETECTED mg/dL
GLUCOSE: NOT DETECTED mg/dL
KETONES: NOT DETECTED mg/dL
LEUKOCYTES: 500 WBCs/uL — AB
PH: 6.5 (ref 5.0–?)
SPECIFIC GRAVITY: 1.019 (ref 1.005–?)
UROBILINOGEN: 6 mg/dL — AB

## 2023-05-03 LAB — URINALYSIS, MICROSCOPIC: WBCS: >100 /HPF — AB

## 2023-05-05 LAB — URINE CULTURE: URINE CULTURE: >100000 — AB

## 2023-05-08 ENCOUNTER — Telehealth (INDEPENDENT_AMBULATORY_CARE_PROVIDER_SITE_OTHER): Payer: Self-pay | Admitting: Family

## 2023-05-08 ENCOUNTER — Other Ambulatory Visit (INDEPENDENT_AMBULATORY_CARE_PROVIDER_SITE_OTHER): Payer: Self-pay | Admitting: Student in an Organized Health Care Education/Training Program

## 2023-05-08 DIAGNOSIS — N390 Urinary tract infection, site not specified: Secondary | ICD-10-CM

## 2023-05-08 DIAGNOSIS — Z4781 Encounter for orthopedic aftercare following surgical amputation: Secondary | ICD-10-CM

## 2023-05-08 MED ORDER — CEPHALEXIN 500 MG CAPSULE
500.0000 mg | ORAL_CAPSULE | Freq: Four times a day (QID) | ORAL | 0 refills | Status: AC
Start: 2023-05-08 — End: 2023-05-15

## 2023-05-08 NOTE — Telephone Encounter (Signed)
Prescription for Keflex sent to [facility] in [place]. Please send results to patient's urologist for follow up after antibiotics.

## 2023-05-08 NOTE — Telephone Encounter (Signed)
Received fax from [facility] with results of the repeat UA. Please see results in "labs"  and advise recommendations.     [address] [place], [place]  05/08/2023, 11:31

## 2023-05-08 NOTE — Telephone Encounter (Signed)
Last scheduled appointment with you was 03/08/2023.  Currently scheduled future appointment is 09/07/2022.      Medication Dispense Information    GABAPENTIN 300MG     CAP   Sig: TAKE 1 CAPSULE BY MOUTH 4 TIMES DAILY   Dispensed: 03/15/2023 12:00 AM   Days supply: 30   Quantity: 120 Each   Refills remaining: 2   Pharmacy: [facility] [address], [place] - [phone]  [address]  [place] [address]  Phone: [phone]  Fax: [phone]   Authorizing provider: Wilmon Pali, MD  [address]  [place] [address]  Phone: [phone]  Fax: [phone]   Received from: [facility] (Fill History, Ambulatory)   Brand or Generic: Generic     Webb Laws, RN  05/08/2023, 14:28

## 2023-05-09 NOTE — Telephone Encounter (Signed)
Noted! Thank you

## 2023-05-09 NOTE — Telephone Encounter (Signed)
Spoke with patient and advised of antibiotic he stated Urology follow up is in January. He stated that foley catheter was removed on 11-6. Urine sample was provided on 11-6. Patient states that sample was provided after foley was removed. Lab results faxed to [facility] urology for follow up.

## 2023-05-10 DIAGNOSIS — Z4781 Encounter for orthopedic aftercare following surgical amputation: Secondary | ICD-10-CM

## 2023-05-15 ENCOUNTER — Encounter (HOSPITAL_BASED_OUTPATIENT_CLINIC_OR_DEPARTMENT_OTHER): Payer: Medicare Other | Admitting: Student in an Organized Health Care Education/Training Program

## 2023-05-15 DIAGNOSIS — Z4781 Encounter for orthopedic aftercare following surgical amputation: Secondary | ICD-10-CM

## 2023-05-15 NOTE — Nursing Note (Signed)
Home health plan of care reviewed, completed, and signed by provider.  See scanned document.  Mikey [employer], RN 05/15/2023 11:46

## 2023-05-31 DIAGNOSIS — Z96651 Presence of right artificial knee joint: Secondary | ICD-10-CM

## 2023-05-31 DIAGNOSIS — Z981 Arthrodesis status: Secondary | ICD-10-CM

## 2023-05-31 DIAGNOSIS — Z86718 Personal history of other venous thrombosis and embolism: Secondary | ICD-10-CM

## 2023-05-31 DIAGNOSIS — G894 Chronic pain syndrome: Secondary | ICD-10-CM

## 2023-05-31 DIAGNOSIS — D696 Thrombocytopenia, unspecified: Secondary | ICD-10-CM

## 2023-05-31 DIAGNOSIS — M48062 Spinal stenosis, lumbar region with neurogenic claudication: Secondary | ICD-10-CM

## 2023-05-31 DIAGNOSIS — E785 Hyperlipidemia, unspecified: Secondary | ICD-10-CM

## 2023-05-31 DIAGNOSIS — I10 Essential (primary) hypertension: Secondary | ICD-10-CM

## 2023-05-31 DIAGNOSIS — Z466 Encounter for fitting and adjustment of urinary device: Secondary | ICD-10-CM

## 2023-05-31 DIAGNOSIS — Z4781 Encounter for orthopedic aftercare following surgical amputation: Secondary | ICD-10-CM

## 2023-05-31 DIAGNOSIS — Z9181 History of falling: Secondary | ICD-10-CM

## 2023-05-31 DIAGNOSIS — G629 Polyneuropathy, unspecified: Secondary | ICD-10-CM

## 2023-06-06 ENCOUNTER — Other Ambulatory Visit (INDEPENDENT_AMBULATORY_CARE_PROVIDER_SITE_OTHER): Payer: Self-pay | Admitting: Student in an Organized Health Care Education/Training Program

## 2023-06-06 DIAGNOSIS — G629 Polyneuropathy, unspecified: Secondary | ICD-10-CM

## 2023-06-06 NOTE — Telephone Encounter (Signed)
Last scheduled appointment with you was 03/08/2023.  Currently scheduled future appointment is 09/07/2023.      Webb Laws, RN  06/06/2023, 09:24

## 2023-06-06 NOTE — Telephone Encounter (Signed)
Last scheduled appointment with you was 03/08/2023.  Currently scheduled future appointment is 09/07/2023.      Webb Laws, RN  06/06/2023, 14:02

## 2023-06-15 ENCOUNTER — Telehealth (INDEPENDENT_AMBULATORY_CARE_PROVIDER_SITE_OTHER): Payer: Self-pay | Admitting: Student in an Organized Health Care Education/Training Program

## 2023-06-15 NOTE — Telephone Encounter (Signed)
Pt has met goals and being discharged from homehealth

## 2023-07-06 ENCOUNTER — Other Ambulatory Visit (INDEPENDENT_AMBULATORY_CARE_PROVIDER_SITE_OTHER): Payer: Self-pay | Admitting: Student in an Organized Health Care Education/Training Program

## 2023-07-06 DIAGNOSIS — I10 Essential (primary) hypertension: Secondary | ICD-10-CM

## 2023-07-06 DIAGNOSIS — G894 Chronic pain syndrome: Secondary | ICD-10-CM

## 2023-07-06 DIAGNOSIS — Z466 Encounter for fitting and adjustment of urinary device: Secondary | ICD-10-CM

## 2023-07-06 DIAGNOSIS — E785 Hyperlipidemia, unspecified: Secondary | ICD-10-CM

## 2023-07-06 DIAGNOSIS — Z9181 History of falling: Secondary | ICD-10-CM

## 2023-07-06 DIAGNOSIS — G629 Polyneuropathy, unspecified: Secondary | ICD-10-CM

## 2023-07-06 DIAGNOSIS — Z981 Arthrodesis status: Secondary | ICD-10-CM

## 2023-07-06 DIAGNOSIS — Z4781 Encounter for orthopedic aftercare following surgical amputation: Secondary | ICD-10-CM

## 2023-07-06 DIAGNOSIS — Z96651 Presence of right artificial knee joint: Secondary | ICD-10-CM

## 2023-07-06 DIAGNOSIS — Z86718 Personal history of other venous thrombosis and embolism: Secondary | ICD-10-CM

## 2023-07-06 DIAGNOSIS — M48062 Spinal stenosis, lumbar region with neurogenic claudication: Secondary | ICD-10-CM

## 2023-07-06 DIAGNOSIS — D696 Thrombocytopenia, unspecified: Secondary | ICD-10-CM

## 2023-07-06 NOTE — Telephone Encounter (Signed)
Last scheduled appointment with you was 03/08/2023.  Currently scheduled future appointment is 09/07/2023.      Medication Dispense Information    GABAPENTIN 300MG     CAP   Sig: SMARTSIG:1 CAPSULE(S) BY MOUTH 4 TIMES DAILY   Dispensed: 06/07/2023 12:00 AM   Days supply: 30   Dispense Note: ORIGINAL SIG: TAKE 1 CAPSULE BY MOUTH 4 TIMES DAILY   Quantity: 120 Each   Refills remaining: 0   Pharmacy: [facility] [address], [place] - [address]  [address]  [place] [address]  Phone: [phone]  Fax: [phone]   Authorizing provider: Wilmon Pali, MD  [address]  [place] [address]  Phone: [phone]  Fax: [phone]   Received from: [facility] (Fill History, Ambulatory)   Brand or Generic: Generic     Webb Laws, RN  07/06/2023, 09:55

## 2023-08-06 ENCOUNTER — Other Ambulatory Visit (INDEPENDENT_AMBULATORY_CARE_PROVIDER_SITE_OTHER): Payer: Self-pay | Admitting: Student in an Organized Health Care Education/Training Program

## 2023-08-06 DIAGNOSIS — G629 Polyneuropathy, unspecified: Secondary | ICD-10-CM

## 2023-08-07 NOTE — Telephone Encounter (Signed)
Last visit with PCP was 03/08/2023.    Last office visit in the department was 03/08/2023.  Currently scheduled future appointment in the department is 09/07/2023.  Contract current.     Donne Hazel, RN, 08/07/2023 , 10:21

## 2023-08-09 ENCOUNTER — Other Ambulatory Visit (INDEPENDENT_AMBULATORY_CARE_PROVIDER_SITE_OTHER): Payer: Self-pay | Admitting: Student in an Organized Health Care Education/Training Program

## 2023-08-09 ENCOUNTER — Encounter (INDEPENDENT_AMBULATORY_CARE_PROVIDER_SITE_OTHER): Payer: Self-pay | Admitting: Student in an Organized Health Care Education/Training Program

## 2023-08-09 DIAGNOSIS — G629 Polyneuropathy, unspecified: Secondary | ICD-10-CM

## 2023-08-09 NOTE — Telephone Encounter (Signed)
Last visit with PCP was 03/08/2023.    Last office visit in the department was 04/20/2023 .  Currently scheduled future appointment in the department is 09/07/2023    [facility], LPN, 1/61/0960 , 13:18     gabapentin 300 mg capsule   Dispensed: 08/07/2023 12:00 AM   Unit strength: 300 MG   Days supply: 30   Dispense Note: [NO ORIGINAL SIG]   Quantity: 120 Capsule   Refills remaining: 0   Pharmacy: [facility] [address] [place] - [address]  [address]  [place] [address]  Phone: [phone]  Fax: [phone]   Authorizing provider: Wilmon Pali, MD  [address]  [place] [address]  Phone: [phone]  Fax: [phone]   Received from: [facility] (Claim History, Ambulatory)   Brand or Generic: Generic

## 2023-08-14 ENCOUNTER — Encounter (HOSPITAL_BASED_OUTPATIENT_CLINIC_OR_DEPARTMENT_OTHER): Payer: Medicare Other | Admitting: Student in an Organized Health Care Education/Training Program

## 2023-08-14 DIAGNOSIS — Z4781 Encounter for orthopedic aftercare following surgical amputation: Secondary | ICD-10-CM

## 2023-08-14 NOTE — Nursing Note (Signed)
Home health plan of care reviewed, completed, and signed by provider.  See scanned document.  Mikey College, RN 08/14/2023 08:44

## 2023-09-06 ENCOUNTER — Other Ambulatory Visit (INDEPENDENT_AMBULATORY_CARE_PROVIDER_SITE_OTHER): Payer: Self-pay | Admitting: Student in an Organized Health Care Education/Training Program

## 2023-09-06 DIAGNOSIS — G629 Polyneuropathy, unspecified: Secondary | ICD-10-CM

## 2023-09-06 NOTE — Telephone Encounter (Addendum)
 Last scheduled appointment with you was 03/08/23.  Currently scheduled future appointment is 09/07/23.      Cydney Ok, LPN  4/78/2956, 12:56      GABAPENTIN 300MG     CAP   Sig: SMARTSIG:1 CAPSULE(S) BY MOUTH 4 TIMES DAILY   Dispensed: 08/07/2023 12:00 AM   Written: 08/07/2023   Days supply: 30   Dispense Note: ORIGINAL SIG: TAKE 1 CAPSULE BY MOUTH 4 TIMES DAILY[SOLD: 08-09-2023]   Quantity: 120 Each   Refills remaining: 0   Pharmacy: [facility] [address] [place] - [address]  [address]  [place] [address]  Phone: [phone]  Fax: [phone]   Authorizing provider: Wilmon Pali, MD  [address]  [place] [address]  Phone: [phone]  Fax: [phone]   Received from: [facility] (Fill History, Ambulatory)   Brand or Generic: Generic

## 2023-09-07 ENCOUNTER — Ambulatory Visit
Attending: Student in an Organized Health Care Education/Training Program | Admitting: Student in an Organized Health Care Education/Training Program

## 2023-09-07 ENCOUNTER — Encounter (INDEPENDENT_AMBULATORY_CARE_PROVIDER_SITE_OTHER): Payer: Self-pay | Admitting: Student in an Organized Health Care Education/Training Program

## 2023-09-07 ENCOUNTER — Other Ambulatory Visit: Payer: Self-pay

## 2023-09-07 VITALS — BP 110/70 | HR 69 | Temp 97.8°F | Resp 16 | Ht 69.0 in | Wt 188.8 lb

## 2023-09-07 DIAGNOSIS — N400 Enlarged prostate without lower urinary tract symptoms: Secondary | ICD-10-CM | POA: Insufficient documentation

## 2023-09-07 DIAGNOSIS — I10 Essential (primary) hypertension: Secondary | ICD-10-CM | POA: Insufficient documentation

## 2023-09-07 DIAGNOSIS — I700 Atherosclerosis of aorta: Secondary | ICD-10-CM | POA: Insufficient documentation

## 2023-09-07 DIAGNOSIS — Z1159 Encounter for screening for other viral diseases: Secondary | ICD-10-CM | POA: Insufficient documentation

## 2023-09-07 DIAGNOSIS — R739 Hyperglycemia, unspecified: Secondary | ICD-10-CM | POA: Insufficient documentation

## 2023-09-07 DIAGNOSIS — K068 Other specified disorders of gingiva and edentulous alveolar ridge: Secondary | ICD-10-CM | POA: Insufficient documentation

## 2023-09-07 MED ORDER — ATORVASTATIN 20 MG TABLET
20.0000 mg | ORAL_TABLET | Freq: Every day | ORAL | 1 refills | Status: DC
Start: 2023-09-07 — End: 2023-11-13

## 2023-09-07 MED ORDER — TAMSULOSIN 0.4 MG CAPSULE
0.4000 mg | ORAL_CAPSULE | Freq: Every day | ORAL | 1 refills | Status: DC
Start: 2023-09-07 — End: 2024-03-11

## 2023-09-07 MED ORDER — TAMSULOSIN 0.4 MG CAPSULE
0.4000 mg | ORAL_CAPSULE | Freq: Every day | ORAL | 1 refills | Status: DC
Start: 2023-09-07 — End: 2023-09-07

## 2023-09-07 MED ORDER — AMOXICILLIN 875 MG-POTASSIUM CLAVULANATE 125 MG TABLET
1.0000 | ORAL_TABLET | Freq: Two times a day (BID) | ORAL | 0 refills | Status: AC
Start: 2023-09-07 — End: 2023-09-14

## 2023-09-07 MED ORDER — LISINOPRIL 30 MG TABLET
30.0000 mg | ORAL_TABLET | Freq: Every day | ORAL | 1 refills | Status: DC
Start: 2023-09-07 — End: 2024-03-11

## 2023-09-07 MED ORDER — CHLORHEXIDINE GLUCONATE 0.12 % MOUTHWASH
15.0000 mL | MOUTHWASH | Freq: Two times a day (BID) | 0 refills | Status: AC
Start: 2023-09-07 — End: 2023-09-21

## 2023-09-07 NOTE — Progress Notes (Signed)
 INTERNAL [facility]ICINE, [place]  [address] [place] [address]  Operated by [facility]  Progress Note    Name: Gary White MRN:  [id number]   Date: 09/07/2023 DOB:  26-Mar-1946 (78 y.o.)             Reason for Visit: Follow Up 6 Months (Pt states his gums have been bleeding for the last three days. States it is painful to chew. ) Per patient this happens once a year and resolves after about a week. Uses rinse multiple time daily. Discussed using sensodyne as gums appear to be bleeding more than before. On exam gums with increased redness and sensitivity.    History of Present Illness  Gary White is a 78 y.o. male who is being seen in our clinic today for the above reason, as well as to follow up on his chronic conditions.    Problem   Bleeding Gums    Pt states his gums have been bleeding for the last three days. States it is painful to chew. ) Per patient this happens once a year and resolves after about a week. Uses rinse multiple time daily. Discussed using sensodyne as gums appear to be bleeding more than before. On exam gums with increased redness and sensitivity.  Does feel worse with spicy, carbonated, hot  Cold water doesn't help  No new exposure to food, contacts, materials, detergents       Atherosclerosis of Aorta ([facility] H[facility])    Atorvastatin 40 mg daily   Adherent as prescribed. No symptoms or side effects.   No results found for: "CHOLESTEROL", "HDLCHOL", "LDLCHOL", "LDLCHOLDIR", "TRIG"        Essential Hypertension    Lisino 30, metoprolol 25 twice daily   Adherent daily. No symptoms or side effects  Home blood pressures are similar  BP Readings from Last 6 Encounters:   09/07/23 110/70   04/20/23 122/62   03/08/23 (!) 90/54   02/15/23 116/68   01/26/23 112/66   01/04/23 118/78         No chest pain, sob          Nursing Notes:   Frederich Balding, RN  09/07/23 1339  Signed     09/07/23 1339   Fall Risk Assessment   Do you feel unsteady when standing or  walking? Yes   Do you worry about falling? Yes   Have you fallen in the past year? No         Frederich Balding, RN  09/07/23 1339  Signed     09/07/23 1339   Recent Weight Change   Have you had a recent unexplained weight loss or gain? Y   Domestic Violence   Because we are aware of abuse and domestic violence today, we ask all patients: Are you being hurt, hit, or frightened by anyone at your home or in your life?  N   Basic Needs   Do you have any basic needs within your home that are not being met? (such as Food, Shelter, Civil Service fast streamer, Tranportation, paying for bills and/or medications) N   Advanced Directives   Do you have any advanced directives? Living Will   Do you have the Advanced Directive(s) so we can scan them to your chart? Acquanetta Sit, RN  09/07/23 1345  Signed     09/07/23 1339   Recent Weight Change   Have you had a recent unexplained  weight loss or gain? N   Domestic Violence   Because we are aware of abuse and domestic violence today, we ask all patients: Are you being hurt, hit, or frightened by anyone at your home or in your life?  N   Basic Needs   Do you have any basic needs within your home that are not being met? (such as Food, Shelter, Civil Service fast streamer, Tranportation, paying for bills and/or medications) N   Advanced Directives   Do you have any advanced directives? Living Will   Do you have the Advanced Directive(s) so we can scan them to your chart? 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    HX TONSILLECTOMY      HX WISDOM TEETH EXTRACTION      LAMINECTOMY  03/2016    L4-5    REPLACEMENT TOTAL KNEE Right     2017 and revision in 2024 with Dr Katherene Ponto at [facility]             Medications  acetaminophen (TYLENOL 8 HOUR) 650 mg Oral Tablet Sustained Release, Take 1 Tablet (650 mg total) by mouth Every 8 hours as needed  cyanocobalamin, vitamin B-12, 5,000 mcg Oral Capsule, 3 capsule DAILY (route: oral)  cyclobenzaprine (FLEXERIL) 10 mg Oral Tablet, TAKE 1 TABLET BY MOUTH THREE TIMES DAILY  finasteride (PROSCAR) 5 mg Oral Tablet, Take 1 Tablet (5 mg total) by mouth Once a day  gabapentin (NEURONTIN) 300 mg Oral Capsule, Take 1 capsule by mouth 4 times daily  metoprolol tartrate (LOPRESSOR) 25 mg Oral Tablet, Take 1 Tablet (25 mg total) by mouth Twice daily  multivitamin with iron Oral Tablet, Take 1 Tablet by mouth Once a day  ondansetron (ZOFRAN ODT) 4 mg Oral Tablet, Rapid Dissolve, Take 1 Tablet (4 mg total) by mouth Every 8 hours as needed for Nausea/Vomiting  triamcinolone acetonide (ARISTOCORT A) 0.1 % Ointment, Apply topically Twice daily  vit C/vit E acet/lutein/min (OCUVITE LUTEIN ORAL), Take 1 Tablet by mouth Once a day  atorvastatin (LIPITOR) 20 mg Oral Tablet, Take 1 Tablet (20 mg total) by mouth Once a day  lisinopriL (PRINIVIL) 30 mg Oral Tablet, Take 1 Tablet (30 mg total) by mouth Once a day for 180 days  tamsulosin (FLOMAX) 0.4 mg Oral Capsule, Take 1 Capsule (0.4 mg total) by mouth Once a day    No facility-administered medications prior to visit.    I have reviewed and reconciled the medication list with the patient today.        No Known Allergies    Family Medical History:       Problem  Relation (Age of Onset)    Cancer Mother    Heart Disease Father    Macular Degen Mother              Social History     Tobacco Use    Smoking status: Former     Types: Cigarettes    Smokeless tobacco: Never   Vaping Use    Vaping status: Never Used   Substance Use Topics     Alcohol use: Not Currently    Drug use: Not Currently     Types: Marijuana     Comment: medical marijuana card          Health Maintenance   Topic Date Due    Hepatitis C screening  Never done    Shingles Vaccine (1 of 2) Never done    RSV Adult 60+ or Pregnancy (1 - 1-dose 75+ series) Never done    Covid-19 Vaccine (1 - 2024-25 season) Never done    Adult Tdap-Td (2 - Td or Tdap) 05/14/2023    Medicare Annual Wellness Visit  01/04/2024    Depression Screening  09/06/2024    Influenza Vaccine  Completed    Pneumococcal Vaccination, Age 49+  Completed          Review of Systems  Rest of ROS negative except as described in the HPI above.    BP 110/70 (Site: Left Arm, Patient Position: Sitting, Cuff Size: Adult)   Pulse 69   Temp 36.6 C (97.8 F) (Temporal)   Resp 16   Ht 1.753 m (5\' 9" )   Wt 85.6 kg (188 lb 12.8 oz)   SpO2 97%   BMI 27.88 kg/m       Physical Exam  Constitutional:       General: He is not in acute distress.  HENT:      Head: Normocephalic.      Mouth/Throat:      Pharynx: No oropharyngeal exudate.   Eyes:      Conjunctiva/sclera: Conjunctivae normal.   Cardiovascular:      Rate and Rhythm: Normal rate and regular rhythm.      Pulses: Normal pulses.      Heart sounds: Normal heart sounds. No murmur heard.     No friction rub. No gallop.   Pulmonary:      Effort: Pulmonary effort is normal. No respiratory distress.      Breath sounds: Normal breath sounds. No stridor. No wheezing, rhonchi or rales.   Skin:     General: Skin is warm.   Neurological:      General: No focal deficit present.      Mental Status: He is alert and oriented to person, place, and time.   Psychiatric:         Mood and Affect: Mood normal.         Behavior: Behavior normal.         Thought Content: Thought content normal.           Depression screening is negative. PHQ 2 Total: 0                 Assessment/Plan:    Problem List Items Addressed This Visit          Cardiovascular System    Atherosclerosis of aorta (CMS HCC) -  Primary    Chronic/stable.Tolerating meds.No side effects.Return in 6 months. Will continue with the medications.          Relevant Medications  atorvastatin (LIPITOR) 20 mg Oral Tablet    Other Relevant Orders    LIPID PANEL    HGA1C (HEMOGLOBIN A1C WITH EST AVG GLUCOSE)    BASIC METABOLIC PANEL    HEPATIC FUNCTION PANEL    Essential hypertension    Chronic/stable.Tolerating meds.No side effects.Return in 6 months. Will continue with the medications.          Relevant Medications    Lisinopril (PRINIVIL) 30 mg Oral Tablet       Digestive    Bleeding gums    Concerning for gingivitis on top of sensitive gums  Will go back to sensodyne  Augmentin x7 days  Chlorhexidine wash         Relevant Medications    chlorhexidine gluconate (PERIDEX) 0.12 % Mucous Membrane Mouthwash    amoxicillin-pot clavulanate (AUGMENTIN) 875-125 mg Oral Tablet     Other Visit Diagnoses         Encounter for HCV screening test for low risk patient        Relevant Orders    HEPATITIS C ANTIBODY SCREEN WITH REFLEX TO HCV PCR      Sugar blood level increased        Relevant Orders    HGA1C (HEMOGLOBIN A1C WITH EST AVG GLUCOSE)      Benign prostatic hyperplasia, unspecified whether lower urinary tract symptoms present        Relevant Medications    tamsulosin (FLOMAX) 0.4 mg Oral Capsule             Orders Placed This Encounter    LIPID PANEL    HGA1C (HEMOGLOBIN A1C WITH EST AVG GLUCOSE)    BASIC METABOLIC PANEL    HEPATIC FUNCTION PANEL    HEPATITIS C ANTIBODY SCREEN WITH REFLEX TO HCV PCR    atorvastatin (LIPITOR) 20 mg Oral Tablet    Lisinopril (PRINIVIL) 30 mg Oral Tablet    tamsulosin (FLOMAX) 0.4 mg Oral Capsule    chlorhexidine gluconate (PERIDEX) 0.12 % Mucous Membrane Mouthwash    amoxicillin-pot clavulanate (AUGMENTIN) 875-125 mg Oral Tablet      Medications Discontinued During This Encounter   Medication Reason    atorvastatin (LIPITOR) 20 mg Oral Tablet Reorder    lisinopriL (PRINIVIL) 30 mg Oral Tablet Reorder    tamsulosin  (FLOMAX) 0.4 mg Oral Capsule Reorder    tamsulosin (FLOMAX) 0.4 mg Oral Capsule          Follow up: Return in about 6 months (around 03/09/2024).    Seek medical attention for new or worsening symptoms.      Wilmon Pali, MD       This note was partially created using MModal Fluency Direct system (voice recognition software) and is inherently subject to errors including those of syntax and "sound-alike" substitutions which may escape proofreading.  In such instances, original meaning may be extrapolated by contextual derivation.

## 2023-09-07 NOTE — Nursing Note (Signed)
 09/07/23 1339   Recent Weight Change   Have you had a recent unexplained weight loss or gain? N   Domestic Violence   Because we are aware of abuse and domestic violence today, we ask all patients: Are you being hurt, hit, or frightened by anyone at your home or in your life?  N   Basic Needs   Do you have any basic needs within your home that are not being met? (such as Food, Shelter, Civil Service fast streamer, Tranportation, paying for bills and/or medications) N   Advanced Directives   Do you have any advanced directives? Living Will   Do you have the Advanced Directive(s) so we can scan them to your chart? Gary White

## 2023-09-07 NOTE — Nursing Note (Signed)
 09/07/23 1339   Recent Weight Change   Have you had a recent unexplained weight loss or gain? Y   Domestic Violence   Because we are aware of abuse and domestic violence today, we ask all patients: Are you being hurt, hit, or frightened by anyone at your home or in your life?  N   Basic Needs   Do you have any basic needs within your home that are not being met? (such as Food, Shelter, Civil Service fast streamer, Tranportation, paying for bills and/or medications) N   Advanced Directives   Do you have any advanced directives? Living Will   Do you have the Advanced Directive(s) so we can scan them to your chart? Jeannie Fend

## 2023-09-07 NOTE — Patient Instructions (Signed)
 It was a pleasure taking care of you today!  Your team today was Nurse Verlon Au or Nurse Tresa Endo, and Dr. Wilmon Pali.  Please keep your follow-up appointment if we scheduled one.   If we ordered any labs or investigations, we will call you or message you with the results in 4-5 days, or sooner if there is something urgent.  If you don't hear back from [place] in a week, please call the clinic or message [place] on MyChart.  There is a provider on call every day of the week, but please refrain from calling outside office hours with non-urgent questions.  You can also message [place] for refills and referrals.  Our fax is: [phone]    I highly recommend you register on MyChart, if you haven't done so already. There you can request medication refills, appointments, your lab results, and referrals.    If you require help with food, transportation, housing, paying bills, and more, please visit [email or web address] and you will see a link at the bottom of the page titled "FindHelp".

## 2023-09-07 NOTE — Assessment & Plan Note (Signed)
 Concerning for gingivitis on top of sensitive gums  Will go back to sensodyne  Augmentin x7 days  Chlorhexidine wash

## 2023-09-07 NOTE — Assessment & Plan Note (Signed)
 Chronic/stable.Tolerating meds.No side effects.Return in 6 months. Will continue with the medications.

## 2023-09-07 NOTE — Nursing Note (Signed)
 09/07/23 1339   Fall Risk Assessment   Do you feel unsteady when standing or walking? Yes   Do you worry about falling? Yes   Have you fallen in the past year? No

## 2023-10-06 ENCOUNTER — Other Ambulatory Visit (INDEPENDENT_AMBULATORY_CARE_PROVIDER_SITE_OTHER): Payer: Self-pay | Admitting: Student in an Organized Health Care Education/Training Program

## 2023-10-06 DIAGNOSIS — G629 Polyneuropathy, unspecified: Secondary | ICD-10-CM

## 2023-10-06 NOTE — Telephone Encounter (Signed)
 Last visit with PCP was 09/07/2023.    Last office visit in the department was 09/07/2023.  Currently scheduled future appointment in the department is 03/11/2024.  Contract current.    Lili Regan, RN, 10/06/2023 , 09:34       Medication Dispense Information    GABAPENTIN 300MG     CAP   Sig: SMARTSIG:1 CAPSULE(S) BY MOUTH 4 TIMES DAILY   Dispensed: 09/06/2023 12:00 AM   Written: 09/06/2023   Days supply: 30   Dispense Note: ORIGINAL SIG: TAKE 1 CAPSULE BY MOUTH 4 TIMES DAILY[SOLD: 09-07-2023]   Quantity: 120 Each   Refills remaining: 0   Pharmacy: [facility] [address], [place] - [address]  [address]  [place] [address]  Phone: [phone]  Fax: [phone]   Authorizing provider: Meyer Ada, MD  [address]  [place] [address]  Phone: [phone]  Fax: [phone]   Received from: [facility] (Fill History, Ambulatory)   Brand or Generic: Generic

## 2023-10-12 ENCOUNTER — Encounter (HOSPITAL_BASED_OUTPATIENT_CLINIC_OR_DEPARTMENT_OTHER): Payer: Self-pay | Admitting: Student in an Organized Health Care Education/Training Program

## 2023-10-12 DIAGNOSIS — Z4781 Encounter for orthopedic aftercare following surgical amputation: Secondary | ICD-10-CM

## 2023-10-12 NOTE — Nursing Note (Signed)
 Home health plan of care reviewed, completed, and signed by provider.  See scanned document.  Norville Beery, RN 10/12/2023 14:15

## 2023-10-13 ENCOUNTER — Encounter (INDEPENDENT_AMBULATORY_CARE_PROVIDER_SITE_OTHER): Payer: Self-pay | Admitting: Student in an Organized Health Care Education/Training Program

## 2023-10-13 DIAGNOSIS — Z4781 Encounter for orthopedic aftercare following surgical amputation: Secondary | ICD-10-CM

## 2023-10-13 NOTE — Nursing Note (Signed)
 Home health plan of care reviewed, completed, and signed by provider.  See scanned document.  Norville Beery, RN 10/13/2023 11:08

## 2023-11-04 ENCOUNTER — Other Ambulatory Visit (INDEPENDENT_AMBULATORY_CARE_PROVIDER_SITE_OTHER): Payer: Self-pay | Admitting: Student in an Organized Health Care Education/Training Program

## 2023-11-04 DIAGNOSIS — G629 Polyneuropathy, unspecified: Secondary | ICD-10-CM

## 2023-11-06 NOTE — Telephone Encounter (Signed)
 Last scheduled appointment with you was 09/07/2023.  Currently scheduled future appointment is 03/11/2024.    Medication Dispense Information    GABAPENTIN  300MG     CAP   Sig: SMARTSIG:1 CAPSULE(S) BY MOUTH 4 TIMES DAILY   Dispensed: 10/06/2023 12:00 AM   Written: 10/06/2023   Days supply: 30   Dispense Note: ORIGINAL SIG: TAKE 1 CAPSULE BY MOUTH 4 TIMES DAILY[SOLD: 10-07-2023]   Quantity: 120 Each   Refills remaining: 0   Pharmacy: [facility] [address], [place] - [address]  [address]  [place] [address]  Phone: [phone]  Fax: [phone]   Authorizing provider: Meyer Ada, MD  [address]  [place] [address]  Phone: [phone]  Fax: [phone]   Received from: [facility] (Fill History, Ambulatory)   Brand or Generic: Generic     Rometta Coad, RN  11/06/2023, 07:47

## 2023-11-07 ENCOUNTER — Encounter (INDEPENDENT_AMBULATORY_CARE_PROVIDER_SITE_OTHER): Payer: Self-pay | Admitting: Student in an Organized Health Care Education/Training Program

## 2023-11-07 NOTE — Telephone Encounter (Deleted)
 Miguel Albino Hanback to P [facility] (supporting Meyer Ada, MD) (Selected Message)  GS      11/07/23  4:02 PM  Please renew my Gabapentin  300mg  prescription.   Thank you,   Gary White

## 2023-11-08 ENCOUNTER — Ambulatory Visit (INDEPENDENT_AMBULATORY_CARE_PROVIDER_SITE_OTHER): Payer: Self-pay

## 2023-11-13 ENCOUNTER — Other Ambulatory Visit (INDEPENDENT_AMBULATORY_CARE_PROVIDER_SITE_OTHER): Payer: Self-pay | Admitting: Student in an Organized Health Care Education/Training Program

## 2023-11-13 DIAGNOSIS — I10 Essential (primary) hypertension: Secondary | ICD-10-CM

## 2023-11-13 DIAGNOSIS — I700 Atherosclerosis of aorta: Secondary | ICD-10-CM

## 2023-11-13 MED ORDER — METOPROLOL TARTRATE 25 MG TABLET
25.0000 mg | ORAL_TABLET | Freq: Two times a day (BID) | ORAL | 0 refills | Status: DC
Start: 2023-11-13 — End: 2024-02-07

## 2023-11-13 MED ORDER — ATORVASTATIN 20 MG TABLET: 20.0000 mg | ORAL_TABLET | Freq: Every day | ORAL | 1 refills | Status: AC

## 2023-11-13 NOTE — Telephone Encounter (Signed)
 Last scheduled appointment with you was 09/07/2023.  Currently scheduled future appointment is 03/11/2024.      Rulon Councilman, LPN  6/64/4034, 13:26

## 2023-12-08 ENCOUNTER — Encounter (INDEPENDENT_AMBULATORY_CARE_PROVIDER_SITE_OTHER): Payer: Self-pay | Admitting: Student in an Organized Health Care Education/Training Program

## 2023-12-08 DIAGNOSIS — N401 Enlarged prostate with lower urinary tract symptoms: Secondary | ICD-10-CM

## 2023-12-08 DIAGNOSIS — G629 Polyneuropathy, unspecified: Secondary | ICD-10-CM

## 2023-12-08 MED ORDER — FINASTERIDE 5 MG TABLET: 5.0000 mg | ORAL_TABLET | Freq: Every day | ORAL | 1 refills | Status: DC

## 2023-12-08 MED ORDER — GABAPENTIN 300 MG CAPSULE
300.0000 mg | ORAL_CAPSULE | Freq: Four times a day (QID) | ORAL | 0 refills | Status: DC
Start: 2023-12-08 — End: 2024-03-11

## 2023-12-08 NOTE — Telephone Encounter (Signed)
 Gary White to P [facility] (supporting Meyer Ada, MD) (Selected Message)  GS      12/08/23  2:13 PM  Please renew Gabapentin  300mg . Can it be renewed for 90 days?  Finasteride  5mg  90 days.  Clementina Cutter.  Thank you.  Murrell Arrant

## 2023-12-08 NOTE — Telephone Encounter (Signed)
 Last scheduled appointment with you was 09/07/2023.  Currently scheduled future appointment is 03/11/2024.    Medication Dispense Information    GABAPENTIN  300MG     CAP   Sig: SMARTSIG:1 CAPSULE(S) BY MOUTH 4 TIMES DAILY   Dispensed: 11/08/2023 12:00 AM   Written: 11/07/2023   Days supply: 30   Dispense Note: ORIGINAL SIG: TAKE 1 CAPSULE BY MOUTH 4 TIMES DAILY[SOLD: 11-08-2023]   Quantity: 120 Each   Refills remaining: 0   Pharmacy: [facility] [address], [place] - [address]  [address]  [place] [address]  Phone: [phone]  Fax: [phone]   Authorizing provider: Meyer Ada, MD  [address]  [place] [address]  Phone: [phone]  Fax: [phone]   Received from: [facility] (Fill History, Ambulatory)   Brand or Generic: Generic     Rometta Coad, RN  12/08/2023, 14:52

## 2024-02-07 ENCOUNTER — Other Ambulatory Visit (INDEPENDENT_AMBULATORY_CARE_PROVIDER_SITE_OTHER): Payer: Self-pay | Admitting: Student in an Organized Health Care Education/Training Program

## 2024-02-07 DIAGNOSIS — I10 Essential (primary) hypertension: Secondary | ICD-10-CM

## 2024-02-07 NOTE — Telephone Encounter (Signed)
 Last visit with PCP was 09/07/2023.    Last office visit in the department was 09/07/2023 .  Currently scheduled future appointment in the department is 03/11/2024.    Tasheena Wambolt, RN, 02/07/2024 , 08:39

## 2024-03-09 ENCOUNTER — Encounter (INDEPENDENT_AMBULATORY_CARE_PROVIDER_SITE_OTHER): Payer: Self-pay | Admitting: Student in an Organized Health Care Education/Training Program

## 2024-03-10 ENCOUNTER — Other Ambulatory Visit: Payer: Self-pay

## 2024-03-11 ENCOUNTER — Encounter (INDEPENDENT_AMBULATORY_CARE_PROVIDER_SITE_OTHER): Payer: Self-pay | Admitting: Student in an Organized Health Care Education/Training Program

## 2024-03-11 ENCOUNTER — Ambulatory Visit (INDEPENDENT_AMBULATORY_CARE_PROVIDER_SITE_OTHER)
Payer: Self-pay | Attending: Student in an Organized Health Care Education/Training Program | Admitting: Student in an Organized Health Care Education/Training Program

## 2024-03-11 VITALS — BP 110/65 | HR 74 | Temp 97.3°F | Ht 69.0 in | Wt 197.8 lb

## 2024-03-11 DIAGNOSIS — Z87891 Personal history of nicotine dependence: Secondary | ICD-10-CM

## 2024-03-11 DIAGNOSIS — R739 Hyperglycemia, unspecified: Secondary | ICD-10-CM | POA: Insufficient documentation

## 2024-03-11 DIAGNOSIS — N400 Enlarged prostate without lower urinary tract symptoms: Secondary | ICD-10-CM | POA: Insufficient documentation

## 2024-03-11 DIAGNOSIS — I10 Essential (primary) hypertension: Secondary | ICD-10-CM | POA: Insufficient documentation

## 2024-03-11 DIAGNOSIS — G629 Polyneuropathy, unspecified: Secondary | ICD-10-CM | POA: Insufficient documentation

## 2024-03-11 DIAGNOSIS — N401 Enlarged prostate with lower urinary tract symptoms: Secondary | ICD-10-CM | POA: Insufficient documentation

## 2024-03-11 DIAGNOSIS — E559 Vitamin D deficiency, unspecified: Secondary | ICD-10-CM | POA: Insufficient documentation

## 2024-03-11 MED ORDER — GABAPENTIN 300 MG CAPSULE
300.0000 mg | ORAL_CAPSULE | Freq: Four times a day (QID) | ORAL | 0 refills | Status: DC
Start: 2024-03-11 — End: 2024-04-12

## 2024-03-11 MED ORDER — LISINOPRIL 30 MG TABLET
30.0000 mg | ORAL_TABLET | Freq: Every day | ORAL | 1 refills | Status: AC
Start: 2024-03-11 — End: 2024-09-07

## 2024-03-11 MED ORDER — TAMSULOSIN 0.4 MG CAPSULE
0.4000 mg | ORAL_CAPSULE | Freq: Every day | ORAL | 1 refills | Status: AC
Start: 2024-03-11 — End: 2024-09-07

## 2024-03-11 NOTE — Telephone Encounter (Signed)
 Addressed during today's office visit. Sonny CHRISTELLA Quale, RN 03/11/2024

## 2024-03-11 NOTE — Assessment & Plan Note (Signed)
 Chronic/stable.Tolerating meds.No side effects.Return in 6 months. Will continue with the medications.

## 2024-03-11 NOTE — Patient Instructions (Signed)
 It was a pleasure taking care of you today!  Your team today was Nurse Verlon Au or Nurse Tresa Endo, and Dr. Wilmon Pali.  Please keep your follow-up appointment if we scheduled one.   If we ordered any labs or investigations, we will call you or message you with the results in 4-5 days, or sooner if there is something urgent.  If you don't hear back from [place] in a week, please call the clinic or message [place] on MyChart.  There is a provider on call every day of the week, but please refrain from calling outside office hours with non-urgent questions.  You can also message [place] for refills and referrals.  Our fax is: [phone]    I highly recommend you register on MyChart, if you haven't done so already. There you can request medication refills, appointments, your lab results, and referrals.    If you require help with food, transportation, housing, paying bills, and more, please visit [email or web address] and you will see a link at the bottom of the page titled "FindHelp".

## 2024-03-11 NOTE — Progress Notes (Signed)
 INTERNAL MEDICINE, [facility]  [place]  [address] [place] [address]  Operated by [facility]  Progress Note    Name: Gary White MRN:  [id number]   Date: 03/11/2024 DOB:  11/28/1945 (78 y.o.)             Reason for Visit: Follow Up 6 Months (Pt denies concerns at this time)    History of Present Illness  Gary White is a 78 y.o. male who is being seen in our clinic today for the above reason, as well as to follow up on his chronic conditions.    Problem   Essential Hypertension    Lisino 30, metoprolol  25 twice daily   Adherent daily. No symptoms or side effects  Home blood pressures are similar  BP Readings from Last 6 Encounters:   03/11/24 110/65   09/07/23 110/70   04/20/23 122/62   03/08/23 (!) 90/54   02/15/23 116/68   01/26/23 112/66        Benign Non-Nodular Prostatic Hyperplasia With Lower Urinary Tract Symptoms    Tamsulosin  0.4 mg nightly  Finasteride  5 mg daily   Adherent as prescribed. No symptoms or side effects.   Doing well on it      Neuropathy ([facility])    Gabapentin  300 mg four times daily  Adherent as prescribed. No symptoms or side effects.  Doing well on it           There are no exam notes on file for this visit.     Medical History     Diagnosis Date Comment Source    Chronic pain  lower back, right knee and arthritis     DVT (deep venous thrombosis)  left groin     Essential hypertension       Neuropathy (CMS HCC)       Spinal cord compression (CMS HCC)       Wears glasses  readers          Past Surgical History:   Procedure Laterality Date   . COLONOSCOPY      x 3, last colonoscopy at age 83 done at [facility]-all negative   . HX BACK SURGERY      October 02/2023   . HX CERVICAL LAMINECTOMY  03/2021    C7-T1   . HX CERVICAL SPINE SURGERY     . 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 HX TONSILLECTOMY     . HX WISDOM TEETH EXTRACTION     . LAMINECTOMY  03/2016    L4-5   . REPLACEMENT TOTAL KNEE Right     2017 and revision in 2024 with Dr Comer at [facility]              Medications  acetaminophen  (TYLENOL  8 HOUR) 650 mg Oral Tablet Sustained Release, Take 1 Tablet (650 mg total) by mouth Every 8 hours as needed  atorvastatin  (LIPITOR) 20 mg Oral Tablet, Take 1 Tablet (20 mg total) by mouth Daily for 180 days (Patient not taking: Reported on 03/11/2024)  cholecalciferol, vitamin D3, (VITAMIN D3 ORAL), Take by mouth  cyanocobalamin, vitamin B-12, 5,000 mcg Oral Capsule, 3 capsule DAILY (route: oral)  cyclobenzaprine  (FLEXERIL ) 10 mg Oral Tablet, TAKE 1 TABLET BY MOUTH THREE TIMES DAILY (Patient taking differently: Take 1 Tablet (10 mg total) by mouth Every 8 hours as needed)  finasteride  (PROSCAR ) 5 mg Oral Tablet, Take 1 Tablet (5 mg total) by mouth Daily  metoprolol  tartrate (LOPRESSOR )  25 mg Oral Tablet, Take 1 tablet by mouth twice daily  multivitamin with iron Oral Tablet, Take 1 Tablet by mouth Once a day  omega-3s/dha/epa/fish oil (OMEGA 3 ORAL), Take by mouth  ondansetron  (ZOFRAN  ODT) 4 mg Oral Tablet, Rapid Dissolve, Take 1 Tablet (4 mg total) by mouth Every 8 hours as needed for Nausea/Vomiting  triamcinolone  acetonide (ARISTOCORT  A) 0.1 % Ointment, Apply topically Twice daily  vit C/vit E acet/lutein/min (OCUVITE LUTEIN ORAL), Take 1 Tablet by mouth Once a day  gabapentin  (NEURONTIN ) 300 mg Oral Capsule, Take 1 Capsule (300 mg total) by mouth Four times a day  Lisinopril  (PRINIVIL ) 30 mg Oral Tablet, Take 1 Tablet (30 mg total) by mouth Once a day for 180 days  tamsulosin  (FLOMAX ) 0.4 mg Oral Capsule, Take 1 Capsule (0.4 mg total) by mouth Once a day for 180 days    No facility-administered medications prior to visit.    I have reviewed and reconciled the medication list with the patient today.        Allergies[1]    Family Medical History:       Problem Relation (Age of Onset)    Cancer Mother    Heart Disease Father    Macular Degen Mother              Social History[2]       Health Maintenance   Topic Date Due   . Hepatitis C screening  Never done   .  Shingles Vaccine (1 of 2) Never done   . RSV Adult 60+ or Pregnancy (1 - 1-dose 75+ series) Never done   . Adult Tdap-Td (2 - Td or Tdap) 05/14/2023   . Medicare Annual Wellness Visit  01/04/2024   . Influenza Vaccine (1) 02/26/2024   . Covid-19 Vaccine (1 - 2024-25 season) Never done   . Depression Screening  09/06/2024   . Pneumococcal Vaccination, Age 8+  Completed          Review of Systems  Rest of ROS negative except as described in the HPI above.    BP 110/65 (Site: Left Arm, Patient Position: Sitting, Cuff Size: Adult)   Pulse 74   Temp 36.3 C (97.3 F) (Temporal)   Ht 1.753 m (5' 9)   Wt 89.7 kg (197 lb 12.8 oz)   SpO2 97%   BMI 29.21 kg/m       Physical Exam  Constitutional:       General: He is not in acute distress.  HENT:      Head: Normocephalic.   Cardiovascular:      Rate and Rhythm: Normal rate and regular rhythm.      Pulses: Normal pulses.      Heart sounds: Normal heart sounds. No murmur heard.     No friction rub. No gallop.   Pulmonary:      Effort: Pulmonary effort is normal. No respiratory distress.      Breath sounds: Normal breath sounds. No stridor. No wheezing, rhonchi or rales.   Skin:     General: Skin is warm.   Neurological:      General: No focal deficit present.      Mental Status: He is alert and oriented to person, place, and time.   Psychiatric:         Mood and Affect: Mood normal.         Behavior: Behavior normal.         Thought Content: Thought content normal.  Assessment/Plan:    Problem List Items Addressed This Visit          Cardiovascular System    Essential hypertension - Primary    Chronic/stable.Tolerating meds.No side effects.Return in 6 months. Will continue with the medications.          Relevant Medications    gabapentin  (NEURONTIN ) 300 mg Oral Capsule    Lisinopril  (PRINIVIL ) 30 mg Oral Tablet    tamsulosin  (FLOMAX ) 0.4 mg Oral Capsule    Other Relevant Orders    COMPREHENSIVE METABOLIC PANEL, NON-FASTING    CBC/DIFF    LIPID  PANEL    VITAMIN D 25 TOTAL    HGA1C (HEMOGLOBIN A1C WITH EST AVG GLUCOSE)    PSA DIAGNOSTIC WITH FREE PSA REFLEX       Neurologic    Neuropathy (CMS HCC)    Chronic/stable.Tolerating meds.No side effects.Return in 6 months.  Will continue with the medications.          Relevant Medications    gabapentin  (NEURONTIN ) 300 mg Oral Capsule    Lisinopril  (PRINIVIL ) 30 mg Oral Tablet    tamsulosin  (FLOMAX ) 0.4 mg Oral Capsule    Other Relevant Orders    COMPREHENSIVE METABOLIC PANEL, NON-FASTING    CBC/DIFF    LIPID PANEL    VITAMIN D 25 TOTAL    HGA1C (HEMOGLOBIN A1C WITH EST AVG GLUCOSE)    PSA DIAGNOSTIC WITH FREE PSA REFLEX       Nephrology    Benign non-nodular prostatic hyperplasia with lower urinary tract symptoms    Chronic/stable.Tolerating meds.No side effects.Return in 6 months. Will continue with the medications.           Other Visit Diagnoses         Benign prostatic hyperplasia, unspecified whether lower urinary tract symptoms present        Relevant Medications    gabapentin  (NEURONTIN ) 300 mg Oral Capsule    Lisinopril  (PRINIVIL ) 30 mg Oral Tablet    tamsulosin  (FLOMAX ) 0.4 mg Oral Capsule    Other Relevant Orders    COMPREHENSIVE METABOLIC PANEL, NON-FASTING    CBC/DIFF    LIPID PANEL    VITAMIN D 25 TOTAL    HGA1C (HEMOGLOBIN A1C WITH EST AVG GLUCOSE)    PSA DIAGNOSTIC WITH FREE PSA REFLEX      Vitamin D deficiency        Relevant Orders    VITAMIN D 25 TOTAL      Sugar blood level increased        Relevant Orders    HGA1C (HEMOGLOBIN A1C WITH EST AVG GLUCOSE)             Orders Placed This Encounter   . COMPREHENSIVE METABOLIC PANEL, NON-FASTING   . CBC/DIFF   . LIPID PANEL   . VITAMIN D 25 TOTAL   . HGA1C (HEMOGLOBIN A1C WITH EST AVG GLUCOSE)   . PSA DIAGNOSTIC WITH FREE PSA REFLEX   . gabapentin  (NEURONTIN ) 300 mg Oral Capsule   . Lisinopril  (PRINIVIL ) 30 mg Oral Tablet   . tamsulosin  (FLOMAX ) 0.4 mg Oral Capsule      Medications Discontinued During This Encounter   Medication Reason   . Lisinopril   (PRINIVIL ) 30 mg Oral Tablet Reorder   . tamsulosin  (FLOMAX ) 0.4 mg Oral Capsule Reorder   . gabapentin  (NEURONTIN ) 300 mg Oral Capsule Reorder         Follow up: Return in about 6 months (around 09/08/2024) for medicare wellness visit.  Seek medical attention for new or worsening symptoms.      Morene Ester, MD       This note was partially created using MModal Fluency Direct system (voice recognition software) and is inherently subject to errors including those of syntax and sound-alike substitutions which may escape proofreading.  In such instances, original meaning may be extrapolated by contextual derivation.             [1]  No Known Allergies  [2]  Social History  Tobacco Use   . Smoking status: Former     Types: Cigarettes   . Smokeless tobacco: Never   Vaping Use   . Vaping status: Never Used   Substance Use Topics   . Alcohol use: Not Currently   . Drug use: Not Currently     Types: Marijuana     Comment: medical marijuana card

## 2024-04-12 ENCOUNTER — Other Ambulatory Visit (INDEPENDENT_AMBULATORY_CARE_PROVIDER_SITE_OTHER): Payer: Self-pay | Admitting: Student in an Organized Health Care Education/Training Program

## 2024-04-12 DIAGNOSIS — N400 Enlarged prostate without lower urinary tract symptoms: Secondary | ICD-10-CM

## 2024-04-12 DIAGNOSIS — I10 Essential (primary) hypertension: Secondary | ICD-10-CM

## 2024-04-12 DIAGNOSIS — G629 Polyneuropathy, unspecified: Secondary | ICD-10-CM

## 2024-04-12 NOTE — Telephone Encounter (Signed)
 Last scheduled appointment with you was 03/11/2024.  Currently scheduled next future appointment with you is 09/09/2024  The next office visit in this department is Visit date not found      Medication Dispense Information    GABAPENTIN  300MG     CAP   Sig: TAKE 1 CAPSULE BY MOUTH 4 TIMES DAILY   Dispensed: 03/11/2024 12:00 AM   Written: 03/11/2024   Days supply: 90   Quantity: 360 Each   Refills remaining: 0   Pharmacy: [facility] [address] [place] - [phone]  [address]  [place] [address]  Phone: [phone]  Fax: [phone]   Authorizing provider: Zeke Katz, MD  [address]  [place] [address]  Phone: [phone]  Fax: [phone]   Received from: [facility] (Fill History, Ambulatory)   Brand or Generic: Generic       Sonny Quale, RN  04/12/2024, 10:22

## 2024-06-13 ENCOUNTER — Other Ambulatory Visit (HOSPITAL_COMMUNITY): Admit: 2024-06-13 | Discharge: 2024-06-13 | Disposition: A | Payer: Self-pay | Admitting: FAMILY MEDICINE

## 2024-06-14 ENCOUNTER — Ambulatory Visit (INDEPENDENT_AMBULATORY_CARE_PROVIDER_SITE_OTHER): Payer: Self-pay | Admitting: Student in an Organized Health Care Education/Training Program

## 2024-06-14 ENCOUNTER — Other Ambulatory Visit (HOSPITAL_COMMUNITY): Payer: Self-pay | Admitting: FAMILY MEDICINE

## 2024-06-14 LAB — COMPREHENSIVE METABOLIC PANEL, NON-FASTING
ALBUMIN: 3 g/dL — ABNORMAL LOW (ref 3.4–4.8)
ALKALINE PHOSPHATASE: 82 U/L (ref 45–115)
ALT (SGPT): <7 U/L (ref <43)
ANION GAP: 7 mmol/L (ref 4–13)
AST (SGOT): 18 U/L (ref 11–34)
BILIRUBIN TOTAL: 1 mg/dL (ref 0.3–1.3)
BUN/CREA RATIO: 35 — ABNORMAL HIGH (ref 6–22)
BUN: 24 mg/dL (ref 8–25)
CALCIUM: 8.6 mg/dL (ref 8.6–10.3)
CHLORIDE: 110 mmol/L (ref 96–111)
CO2 TOTAL: 22 mmol/L — ABNORMAL LOW (ref 23–31)
CREATININE: 0.69 mg/dL — ABNORMAL LOW (ref 0.75–1.35)
GLUCOSE: 76 mg/dL (ref 65–125)
POTASSIUM: 4.1 mmol/L (ref 3.5–5.1)
PROTEIN TOTAL: 4.9 g/dL — ABNORMAL LOW (ref 6.0–8.0)
SODIUM: 139 mmol/L (ref 136–145)
eGFRcr - MALE: >90 mL/min/1.73mˆ2 (ref >=60)

## 2024-06-14 LAB — CBC WITH DIFF
BASOPHIL #: <0.1 x10ˆ3/uL (ref <=0.20)
BASOPHIL %: 0.2 %
EOSINOPHIL #: 0.1 x10ˆ3/uL (ref <=0.50)
EOSINOPHIL %: 1.9 %
HCT: 28.9 % — ABNORMAL LOW (ref 38.9–52.0)
HGB: 9.8 g/dL — ABNORMAL LOW (ref 13.4–17.5)
IMMATURE GRANULOCYTE #: <0.1 x10ˆ3/uL (ref <0.10)
IMMATURE GRANULOCYTE %: 0.2 % (ref 0.0–1.0)
LYMPHOCYTE #: 0.86 x10ˆ3/uL — ABNORMAL LOW (ref 1.00–4.80)
LYMPHOCYTE %: 16 %
MCH: 30.6 pg (ref 26.0–32.0)
MCHC: 33.9 g/dL (ref 31.0–35.5)
MCV: 90.3 fL (ref 78.0–100.0)
MONOCYTE #: 0.53 x10ˆ3/uL (ref 0.20–1.10)
MONOCYTE %: 9.9 %
MPV: 9.5 fL (ref 8.7–12.5)
NEUTROPHIL #: 3.87 x10ˆ3/uL (ref 1.50–7.70)
NEUTROPHIL %: 71.8 %
PLATELETS: 127 x10ˆ3/uL — ABNORMAL LOW (ref 150–400)
RBC: 3.2 x10ˆ6/uL — ABNORMAL LOW (ref 4.50–6.10)
RDW-CV: 14.9 % (ref 11.5–15.5)
WBC: 5.4 x10ˆ3/uL (ref 3.7–11.0)

## 2024-06-14 LAB — CBC/DIFF - CLIENT CONSOLIDATED
BASOPHIL #: <0.1 x10ˆ3/uL (ref <=0.20)
BASOPHIL %: 0.2 %
EOSINOPHIL #: 0.1 x10ˆ3/uL (ref <=0.50)
EOSINOPHIL %: 1.9 %
HCT: 28.9 % — ABNORMAL LOW (ref 38.9–52.0)
HGB: 9.8 g/dL — ABNORMAL LOW (ref 13.4–17.5)
IMMATURE GRANULOCYTE #: <0.1 x10ˆ3/uL (ref <0.10)
IMMATURE GRANULOCYTE %: 0.2 % (ref 0.0–1.0)
LYMPHOCYTE #: 0.86 x10ˆ3/uL — ABNORMAL LOW (ref 1.00–4.80)
LYMPHOCYTE %: 16 %
MCH: 30.6 pg (ref 26.0–32.0)
MCHC: 33.9 g/dL (ref 31.0–35.5)
MCV: 90.3 fL (ref 78.0–100.0)
MONOCYTE #: 0.53 x10ˆ3/uL (ref 0.20–1.10)
MONOCYTE %: 9.9 %
MPV: 9.5 fL (ref 8.7–12.5)
NEUTROPHIL #: 3.87 x10ˆ3/uL (ref 1.50–7.70)
NEUTROPHIL %: 71.8 %
PLATELETS: 127 x10ˆ3/uL — ABNORMAL LOW (ref 150–400)
RBC: 3.2 x10ˆ6/uL — ABNORMAL LOW (ref 4.50–6.10)
RDW-CV: 14.9 % (ref 11.5–15.5)
WBC: 5.4 x10ˆ3/uL (ref 3.7–11.0)

## 2024-06-14 NOTE — Result Encounter Note (Signed)
 Anemia improving, still present  Low protein   Let's have patient come in to discuss next steps  Prior to next appt: let's get iron panel, CBC with pathologist read of smear, reticulocyte count, haptoglobin, LDH, rbc folate, vitamin B12, CRP, UA without reflex to culture, FIT test,

## 2024-06-17 ENCOUNTER — Other Ambulatory Visit (HOSPITAL_COMMUNITY): Payer: Self-pay | Admitting: FAMILY MEDICINE

## 2024-06-17 LAB — CBC WITH DIFF
BASOPHIL #: <0.1 x10ˆ3/uL (ref <=0.20)
BASOPHIL %: 0.3 %
EOSINOPHIL #: 0.23 x10ˆ3/uL (ref <=0.50)
EOSINOPHIL %: 4 %
HCT: 29.9 % — ABNORMAL LOW (ref 38.9–52.0)
HGB: 10 g/dL — ABNORMAL LOW (ref 13.4–17.5)
IMMATURE GRANULOCYTE #: <0.1 x10ˆ3/uL (ref <0.10)
IMMATURE GRANULOCYTE %: 0.5 % (ref 0.0–1.0)
LYMPHOCYTE #: 1.03 x10ˆ3/uL (ref 1.00–4.80)
LYMPHOCYTE %: 17.8 %
MCH: 29.9 pg (ref 26.0–32.0)
MCHC: 33.4 g/dL (ref 31.0–35.5)
MCV: 89.3 fL (ref 78.0–100.0)
MONOCYTE #: 0.51 x10ˆ3/uL (ref 0.20–1.10)
MONOCYTE %: 8.8 %
MPV: 9.2 fL (ref 8.7–12.5)
NEUTROPHIL #: 3.97 x10ˆ3/uL (ref 1.50–7.70)
NEUTROPHIL %: 68.6 %
PLATELETS: 152 x10ˆ3/uL (ref 150–400)
RBC: 3.35 x10ˆ6/uL — ABNORMAL LOW (ref 4.50–6.10)
RDW-CV: 14.7 % (ref 11.5–15.5)
WBC: 5.8 x10ˆ3/uL (ref 3.7–11.0)

## 2024-06-17 LAB — CBC/DIFF - CLIENT CONSOLIDATED
BASOPHIL #: <0.1 x10ˆ3/uL (ref <=0.20)
BASOPHIL %: 0.3 %
EOSINOPHIL #: 0.23 x10ˆ3/uL (ref <=0.50)
EOSINOPHIL %: 4 %
HCT: 29.9 % — ABNORMAL LOW (ref 38.9–52.0)
HGB: 10 g/dL — ABNORMAL LOW (ref 13.4–17.5)
IMMATURE GRANULOCYTE #: <0.1 x10ˆ3/uL (ref <0.10)
IMMATURE GRANULOCYTE %: 0.5 % (ref 0.0–1.0)
LYMPHOCYTE #: 1.03 x10ˆ3/uL (ref 1.00–4.80)
LYMPHOCYTE %: 17.8 %
MCH: 29.9 pg (ref 26.0–32.0)
MCHC: 33.4 g/dL (ref 31.0–35.5)
MCV: 89.3 fL (ref 78.0–100.0)
MONOCYTE #: 0.51 x10ˆ3/uL (ref 0.20–1.10)
MONOCYTE %: 8.8 %
MPV: 9.2 fL (ref 8.7–12.5)
NEUTROPHIL #: 3.97 x10ˆ3/uL (ref 1.50–7.70)
NEUTROPHIL %: 68.6 %
PLATELETS: 152 x10ˆ3/uL (ref 150–400)
RBC: 3.35 x10ˆ6/uL — ABNORMAL LOW (ref 4.50–6.10)
RDW-CV: 14.7 % (ref 11.5–15.5)
WBC: 5.8 x10ˆ3/uL (ref 3.7–11.0)

## 2024-06-17 LAB — BASIC METABOLIC PANEL, FASTING
ANION GAP: 7 mmol/L (ref 4–13)
BUN/CREA RATIO: 35 — ABNORMAL HIGH (ref 6–22)
BUN: 22 mg/dL (ref 8–25)
CALCIUM: 8.5 mg/dL — ABNORMAL LOW (ref 8.6–10.3)
CHLORIDE: 109 mmol/L (ref 96–111)
CO2 TOTAL: 24 mmol/L (ref 23–31)
CREATININE: 0.62 mg/dL — ABNORMAL LOW (ref 0.75–1.35)
GLUCOSE: 82 mg/dL (ref 70–99)
POTASSIUM: 4.2 mmol/L (ref 3.5–5.1)
SODIUM: 140 mmol/L (ref 136–145)
eGFRcr - MALE: >90 mL/min/1.73mˆ2 (ref >=60)

## 2024-06-20 ENCOUNTER — Other Ambulatory Visit (HOSPITAL_COMMUNITY): Payer: Self-pay | Admitting: FAMILY MEDICINE

## 2024-06-20 LAB — CBC WITH DIFF
BASOPHIL #: <0.1 x10ˆ3/uL (ref <=0.20)
BASOPHIL %: 0.4 %
EOSINOPHIL #: 0.29 x10ˆ3/uL (ref <=0.50)
EOSINOPHIL %: 5.7 %
HCT: 30 % — ABNORMAL LOW (ref 38.9–52.0)
HGB: 10.1 g/dL — ABNORMAL LOW (ref 13.4–17.5)
IMMATURE GRANULOCYTE #: <0.1 x10ˆ3/uL (ref <0.10)
IMMATURE GRANULOCYTE %: 0.6 % (ref 0.0–1.0)
LYMPHOCYTE #: 1.12 x10ˆ3/uL (ref 1.00–4.80)
LYMPHOCYTE %: 22 %
MCH: 30.1 pg (ref 26.0–32.0)
MCHC: 33.7 g/dL (ref 31.0–35.5)
MCV: 89.3 fL (ref 78.0–100.0)
MONOCYTE #: 0.4 x10ˆ3/uL (ref 0.20–1.10)
MONOCYTE %: 7.8 %
MPV: 8.9 fL (ref 8.7–12.5)
NEUTROPHIL #: 3.24 x10ˆ3/uL (ref 1.50–7.70)
NEUTROPHIL %: 63.5 %
PLATELETS: 165 x10ˆ3/uL (ref 150–400)
RBC: 3.36 x10ˆ6/uL — ABNORMAL LOW (ref 4.50–6.10)
RDW-CV: 14.9 % (ref 11.5–15.5)
WBC: 5.1 x10ˆ3/uL (ref 3.7–11.0)

## 2024-06-20 LAB — CBC/DIFF - CLIENT CONSOLIDATED
BASOPHIL #: <0.1 x10ˆ3/uL (ref <=0.20)
BASOPHIL %: 0.4 %
EOSINOPHIL #: 0.29 x10ˆ3/uL (ref <=0.50)
EOSINOPHIL %: 5.7 %
HCT: 30 % — ABNORMAL LOW (ref 38.9–52.0)
HGB: 10.1 g/dL — ABNORMAL LOW (ref 13.4–17.5)
IMMATURE GRANULOCYTE #: <0.1 x10ˆ3/uL (ref <0.10)
IMMATURE GRANULOCYTE %: 0.6 % (ref 0.0–1.0)
LYMPHOCYTE #: 1.12 x10ˆ3/uL (ref 1.00–4.80)
LYMPHOCYTE %: 22 %
MCH: 30.1 pg (ref 26.0–32.0)
MCHC: 33.7 g/dL (ref 31.0–35.5)
MCV: 89.3 fL (ref 78.0–100.0)
MONOCYTE #: 0.4 x10ˆ3/uL (ref 0.20–1.10)
MONOCYTE %: 7.8 %
MPV: 8.9 fL (ref 8.7–12.5)
NEUTROPHIL #: 3.24 x10ˆ3/uL (ref 1.50–7.70)
NEUTROPHIL %: 63.5 %
PLATELETS: 165 x10ˆ3/uL (ref 150–400)
RBC: 3.36 x10ˆ6/uL — ABNORMAL LOW (ref 4.50–6.10)
RDW-CV: 14.9 % (ref 11.5–15.5)
WBC: 5.1 x10ˆ3/uL (ref 3.7–11.0)

## 2024-06-20 LAB — BASIC METABOLIC PANEL, FASTING
ANION GAP: 5 mmol/L (ref 4–13)
BUN/CREA RATIO: 38 — ABNORMAL HIGH (ref 6–22)
BUN: 25 mg/dL (ref 8–25)
CALCIUM: 8.5 mg/dL — ABNORMAL LOW (ref 8.6–10.3)
CHLORIDE: 110 mmol/L (ref 96–111)
CO2 TOTAL: 24 mmol/L (ref 23–31)
CREATININE: 0.66 mg/dL — ABNORMAL LOW (ref 0.75–1.35)
GLUCOSE: 86 mg/dL (ref 70–99)
POTASSIUM: 4.1 mmol/L (ref 3.5–5.1)
SODIUM: 139 mmol/L (ref 136–145)
eGFRcr - MALE: >90 mL/min/1.73mˆ2 (ref >=60)

## 2024-06-24 ENCOUNTER — Telehealth (INDEPENDENT_AMBULATORY_CARE_PROVIDER_SITE_OTHER): Payer: Self-pay | Admitting: Student in an Organized Health Care Education/Training Program

## 2024-06-24 ENCOUNTER — Other Ambulatory Visit (HOSPITAL_COMMUNITY): Payer: Self-pay | Admitting: FAMILY MEDICINE

## 2024-06-24 LAB — CBC/DIFF - CLIENT CONSOLIDATED
BASOPHIL #: <0.1 x10ˆ3/uL (ref <=0.20)
BASOPHIL %: 0.5 %
EOSINOPHIL #: 0.27 x10ˆ3/uL (ref <=0.50)
EOSINOPHIL %: 4.1 %
HCT: 32.8 % — ABNORMAL LOW (ref 38.9–52.0)
HGB: 10.7 g/dL — ABNORMAL LOW (ref 13.4–17.5)
IMMATURE GRANULOCYTE #: <0.1 x10ˆ3/uL (ref <0.10)
IMMATURE GRANULOCYTE %: 0.8 % (ref 0.0–1.0)
LYMPHOCYTE #: 1.15 x10ˆ3/uL (ref 1.00–4.80)
LYMPHOCYTE %: 17.5 %
MCH: 29.6 pg (ref 26.0–32.0)
MCHC: 32.6 g/dL (ref 31.0–35.5)
MCV: 90.9 fL (ref 78.0–100.0)
MONOCYTE #: 0.49 x10ˆ3/uL (ref 0.20–1.10)
MONOCYTE %: 7.4 %
MPV: 9 fL (ref 8.7–12.5)
NEUTROPHIL #: 4.59 x10ˆ3/uL (ref 1.50–7.70)
NEUTROPHIL %: 69.7 %
PLATELETS: 177 x10ˆ3/uL (ref 150–400)
RBC: 3.61 x10ˆ6/uL — ABNORMAL LOW (ref 4.50–6.10)
RDW-CV: 14.6 % (ref 11.5–15.5)
WBC: 6.6 x10ˆ3/uL (ref 3.7–11.0)

## 2024-06-24 LAB — CBC WITH DIFF
BASOPHIL #: <0.1 x10ˆ3/uL (ref <=0.20)
BASOPHIL %: 0.5 %
EOSINOPHIL #: 0.27 x10ˆ3/uL (ref <=0.50)
EOSINOPHIL %: 4.1 %
HCT: 32.8 % — ABNORMAL LOW (ref 38.9–52.0)
HGB: 10.7 g/dL — ABNORMAL LOW (ref 13.4–17.5)
IMMATURE GRANULOCYTE #: <0.1 x10ˆ3/uL (ref <0.10)
IMMATURE GRANULOCYTE %: 0.8 % (ref 0.0–1.0)
LYMPHOCYTE #: 1.15 x10ˆ3/uL (ref 1.00–4.80)
LYMPHOCYTE %: 17.5 %
MCH: 29.6 pg (ref 26.0–32.0)
MCHC: 32.6 g/dL (ref 31.0–35.5)
MCV: 90.9 fL (ref 78.0–100.0)
MONOCYTE #: 0.49 x10ˆ3/uL (ref 0.20–1.10)
MONOCYTE %: 7.4 %
MPV: 9 fL (ref 8.7–12.5)
NEUTROPHIL #: 4.59 x10ˆ3/uL (ref 1.50–7.70)
NEUTROPHIL %: 69.7 %
PLATELETS: 177 x10ˆ3/uL (ref 150–400)
RBC: 3.61 x10ˆ6/uL — ABNORMAL LOW (ref 4.50–6.10)
RDW-CV: 14.6 % (ref 11.5–15.5)
WBC: 6.6 x10ˆ3/uL (ref 3.7–11.0)

## 2024-06-24 LAB — BASIC METABOLIC PANEL, FASTING
ANION GAP: 7 mmol/L (ref 4–13)
BUN/CREA RATIO: 32 — ABNORMAL HIGH (ref 6–22)
BUN: 25 mg/dL (ref 8–25)
CALCIUM: 8.4 mg/dL — ABNORMAL LOW (ref 8.6–10.3)
CHLORIDE: 107 mmol/L (ref 96–111)
CO2 TOTAL: 25 mmol/L (ref 23–31)
CREATININE: 0.79 mg/dL (ref 0.75–1.35)
GLUCOSE: 82 mg/dL (ref 70–99)
POTASSIUM: 4.3 mmol/L (ref 3.5–5.1)
SODIUM: 139 mmol/L (ref 136–145)
eGFRcr - MALE: >90 mL/min/1.73mˆ2 (ref >=60)

## 2024-06-24 NOTE — Telephone Encounter (Signed)
 Patient is discharging from [facility] with Home Health orders for SN.     Will PCP sign orders?

## 2024-06-24 NOTE — Telephone Encounter (Signed)
 TOC smart form needs completed    Patient is being discharged from [facility] today 12-29 and will follow up with Ulanda Hales on 1-7@11 :30am    Medical records have been requested from the discharging facility.     Suzen Jenkins Archer

## 2024-06-24 NOTE — Telephone Encounter (Signed)
Ok to follow?  Lewayne Bunting, RN

## 2024-06-26 ENCOUNTER — Other Ambulatory Visit (INDEPENDENT_AMBULATORY_CARE_PROVIDER_SITE_OTHER): Payer: Self-pay | Admitting: Student in an Organized Health Care Education/Training Program

## 2024-06-26 DIAGNOSIS — N401 Enlarged prostate with lower urinary tract symptoms: Secondary | ICD-10-CM

## 2024-06-26 NOTE — Telephone Encounter (Signed)
 Last visit with PCP was 03/11/2024   Last office visit in the department was 03/11/2024 .  Currently scheduled future appointment in the department is 09/09/2024.    Lovena Sor, RN, 06/26/2024 , 10:10

## 2024-06-26 NOTE — Telephone Encounter (Signed)
 Transition of Care Contact Information  Discharge Date: 06/24/2024  Transition Facility Type--Skilled Nursing Facility  Facility Name--[facility]  Interactive Contact(s): Completed or attempted contact indicated by Date/Time  Completed Contact: 06/26/2024 10:11 AM  First Attempt Call: 06/26/2024 10:11 AM  Contact Method(s)-- MyChart Patient Portal  Clinical Staff Name/Role who contacted--Keneisha Heckart, RN  Transition Assessment  Discharge Summary obtained?--No  How are you recovering?  Discharge Meds obtained?  Discharge medication changes reviewed?--No  Full Medication Reconciliation Completed?--No  Medication Concerns?  Have everything needed for recovery?  Care Coordination:   Patient has transition follow-up appointment date and time?--Yes  Follow up appointment date:--07/03/2024  Specialist Transition Visit planned?  Specialist Transition Visit date:  Patient/caregiver plans to attend transition visit?  Primary Follow-up Barrier  Interventions provided --follow-up appointment date/time reinforced  Home Health or DME ordered at discharge?  Clinician/Team notified?  Primary reason clinician notified?  Transition Note:MyChart message sent to pt. Encouraged pt to keep follow up appt with PCP office and any specialists if scheduled. Advised to contact office with any concerns. Nathanie Ottley, RN

## 2024-07-03 ENCOUNTER — Ambulatory Visit: Payer: Self-pay | Attending: Family | Admitting: Family

## 2024-07-03 ENCOUNTER — Other Ambulatory Visit: Payer: Self-pay

## 2024-07-03 ENCOUNTER — Encounter (INDEPENDENT_AMBULATORY_CARE_PROVIDER_SITE_OTHER): Payer: Self-pay | Admitting: Family

## 2024-07-03 DIAGNOSIS — N401 Enlarged prostate with lower urinary tract symptoms: Secondary | ICD-10-CM | POA: Insufficient documentation

## 2024-07-03 DIAGNOSIS — S82201G Unspecified fracture of shaft of right tibia, subsequent encounter for closed fracture with delayed healing: Secondary | ICD-10-CM | POA: Insufficient documentation

## 2024-07-03 DIAGNOSIS — T8189XA Other complications of procedures, not elsewhere classified, initial encounter: Secondary | ICD-10-CM | POA: Insufficient documentation

## 2024-07-03 DIAGNOSIS — S82899A Other fracture of unspecified lower leg, initial encounter for closed fracture: Secondary | ICD-10-CM | POA: Insufficient documentation

## 2024-07-03 DIAGNOSIS — S82401G Unspecified fracture of shaft of right fibula, subsequent encounter for closed fracture with delayed healing: Secondary | ICD-10-CM | POA: Insufficient documentation

## 2024-07-03 DIAGNOSIS — G629 Polyneuropathy, unspecified: Secondary | ICD-10-CM | POA: Insufficient documentation

## 2024-07-03 DIAGNOSIS — Z09 Encounter for follow-up examination after completed treatment for conditions other than malignant neoplasm: Secondary | ICD-10-CM | POA: Insufficient documentation

## 2024-07-03 DIAGNOSIS — I10 Essential (primary) hypertension: Secondary | ICD-10-CM | POA: Insufficient documentation

## 2024-07-03 DIAGNOSIS — N400 Enlarged prostate without lower urinary tract symptoms: Secondary | ICD-10-CM | POA: Insufficient documentation

## 2024-07-03 DIAGNOSIS — Z978 Presence of other specified devices: Secondary | ICD-10-CM | POA: Insufficient documentation

## 2024-07-03 DIAGNOSIS — W19XXXA Unspecified fall, initial encounter: Secondary | ICD-10-CM

## 2024-07-03 DIAGNOSIS — S82891G Other fracture of right lower leg, subsequent encounter for closed fracture with delayed healing: Secondary | ICD-10-CM | POA: Insufficient documentation

## 2024-07-03 MED ORDER — GABAPENTIN 300 MG CAPSULE: 300.0000 mg | ORAL_CAPSULE | Freq: Four times a day (QID) | ORAL | 0 refills | Status: AC

## 2024-07-03 MED ORDER — FINASTERIDE 5 MG TABLET: 5.0000 mg | ORAL_TABLET | Freq: Every day | ORAL | 0 refills | Status: AC

## 2024-07-03 NOTE — Progress Notes (Signed)
 FAMILY MEDICINE, [facility]  [address] [place]  [place] [address]  Operated by [facility]  Transitional Care Management Note    Name: Gary White MRN:  [id number]   Date: 07/03/2024 Age: 79 y.o.     Chief Complaint: Hospital Follow Up (Pt was DC from [facility]. Pt was at [facility] before, he had surgery of his right ankle. Follow up with [facility] urology 1/8, for the catheter. Appt Friday with [facility] at wound care. HH through [facility]. )      SUBJECTIVE:  Gary White is a 79 y.o. male presenting today for follow-up after being discharged. The main problem requiring admission was right ankle fracture.     History of Present Illness  Gary White is a 79 year old male who presents for hospital follow-up after a fall resulting in a right ankle fracture. He was referred to Dr. Rhett from [facility] post discharge for wound and fracture management. He is following with Wound Care in [facility] for the slow healing medial ankle wound. He has home health with [facility] for nursing services only as he is non-weightbearing to right foot for now.     He experienced a fall on December 17th for which he presented to [facility], resulting in fractures of both leg bones and the ankle. Surgery was performed the following day, involving the placement of pins, rods, and screws. He was discharged from the hospital on December 19th and transferred to [facility] for rehabilitation, where he stayed until December 29th.    The lateral right ankle wound is healing well, but the medial right ankle wound is not progressing as expected. Initially, a hard cast was placed, but due to slow healing, it was replaced with a boot to facilitate wound care access. He has seen Ortho since discharge 2 times and all staples have been removed.    Pain is primarily experienced as spasms, rated as a 6 out of 10 during episodes, and a 2 to 3 out of 10 at rest. He takes baclofen as needed for  spasms and hydrocodone with Tylenol  at night to aid sleep.    A catheter was placed on December 22nd due to difficulty voiding while bedridden. He is on antibiotics to prevent a urinary tract infection. He has follow up with Urology tomorrow where his catheter will hopefully be removed.    He is currently taking baclofen for spasms, hydrocodone with Tylenol  for pain, finasteride , and gabapentin , which he takes four times a day. He has requested refills for finasteride  and gabapentin  as he is running low on these medications.    He is here today for an [facility] follow up.     Urology follow up and was cancelled. Has catheter?    Current Outpatient Medications   Medication Sig    acetaminophen  (TYLENOL  8 HOUR) 650 mg Oral Tablet Sustained Release Take 1 Tablet (650 mg total) by mouth Every 8 hours as needed    atorvastatin  (LIPITOR) 20 mg Oral Tablet Take 1 Tablet (20 mg total) by mouth Daily for 180 days    baclofen (LIORESAL) 5 mg Oral Tablet Take 1 Tablet (5 mg total) by mouth Three times a day    cephalexin  (KEFLEX ) 500 mg Oral Capsule Take 1 Capsule (500 mg total) by mouth Three times a day    cholecalciferol, vitamin D3, (VITAMIN D3 ORAL) Take by mouth    cyanocobalamin, vitamin B-12, 5,000 mcg Oral Capsule 3 capsule DAILY (route:  oral)    cyclobenzaprine  (FLEXERIL ) 10 mg Oral Tablet TAKE 1 TABLET BY MOUTH THREE TIMES DAILY (Patient taking differently: Take 1 Tablet (10 mg total) by mouth Every 8 hours as needed)    finasteride  (PROSCAR ) 5 mg Oral Tablet Take 1 Tablet (5 mg total) by mouth Daily    gabapentin  (NEURONTIN ) 300 mg Oral Capsule Take 1 Capsule (300 mg total) by mouth Four times a day    HYDROcodone-acetaminophen  (NORCO) 7.5-325 mg Oral Tablet Take 1-2 Tablets by mouth Every 6 hours as needed    Lisinopril  (PRINIVIL ) 30 mg Oral Tablet Take 1 Tablet (30 mg total) by mouth Daily    metoprolol  tartrate (LOPRESSOR ) 25 mg Oral Tablet Take 1 tablet by mouth twice daily    multivitamin with iron Oral Tablet  Take 1 Tablet by mouth Once a day    omega-3s/dha/epa/fish oil (OMEGA 3 ORAL) Take by mouth    ondansetron  (ZOFRAN  ODT) 4 mg Oral Tablet, Rapid Dissolve Take 1 Tablet (4 mg total) by mouth Every 8 hours as needed for Nausea/Vomiting    tamsulosin  (FLOMAX ) 0.4 mg Oral Capsule Take 1 Capsule (0.4 mg total) by mouth Daily    triamcinolone  acetonide (ARISTOCORT  A) 0.1 % Ointment Apply topically Twice daily    vit C/vit E acet/lutein/min (OCUVITE LUTEIN ORAL) Take 1 Tablet by mouth Once a day        OBJECTIVE:   BP 124/72   Pulse 83   Ht 1.753 m (5' 9)   Wt 89.4 kg (197 lb)   SpO2 98%   BMI 29.09 kg/m    Physical Exam  Constitutional:       General: He is not in acute distress.     Appearance: Normal appearance. He is not ill-appearing.   HENT:      Head: Normocephalic.   Cardiovascular:      Rate and Rhythm: Normal rate and regular rhythm.      Pulses: Normal pulses.      Heart sounds: Normal heart sounds. No murmur heard.  Pulmonary:      Effort: Pulmonary effort is normal. No respiratory distress.      Breath sounds: No wheezing.   Musculoskeletal:         General: No swelling or signs of injury. Normal range of motion.      Cervical back: Normal range of motion. No tenderness.      Right lower leg: Deformity (boot in place) present.      Comments: In wheelchair   Lymphadenopathy:      Cervical: No cervical adenopathy.   Skin:     General: Skin is warm and dry.      Capillary Refill: Capillary refill takes less than 2 seconds.      Findings: No erythema or rash.   Neurological:      General: No focal deficit present.      Mental Status: He is alert and oriented to person, place, and time. Mental status is at baseline.      Motor: No weakness.   Psychiatric:         Mood and Affect: Mood normal.         Behavior: Behavior normal.         Transition of Care Contact Information  Discharge date: Discharge Date: 06/24/2024  Transition Facility Type--Skilled Nursing Facility  Facility Name--[facility] Interactive Contact(s):  Completed Contact: 06/26/2024 10:11 AM  Contact Method(s)-- MyChart Patient Portal  Clinical Staff Name/Role who contacted--Kelly DeBolt,  RN     Data Reviewed  Medication Reconciliation completed    Assessment & Plan  Postoperative right ankle fracture with delayed wound healing  Surgery involved pins, rods, and screws. Wound healing slowly, with incomplete internal closure. Non-weight bearing, using a boot. Pain managed with baclofen and hydrocodone. Wound care ongoing.  - Continue non-weight bearing status.  - Attend wound care appointments as scheduled.  - Follow up with orthopedic surgeon in two weeks.    Benign prostatic hyperplasia with lower urinary tract symptoms  On finasteride , refill delayed due to pharmacy staff absence.  - Refilled finasteride  prescription at pharmacy.    Urinary retention with indwelling catheter  Catheter placed on December 22nd due to voiding difficulty. No change since placement. Scheduled urology visit for management and potential removal. Discussed bladder retraining and possible removal based on bladder function test results.  - Attend urology appointment for catheter management.  - Discuss catheter removal with urology.        Follow up: Return for as scheduled or sooner if needed.    Seek medical attention for new or worsening symptoms.    Patient has been seen in this clinic within the last 3 years.    Ulanda Hales, APRN, CNP          This note was created using [employer] M*Modal Fluency Align mobile application via conversational audio. Consent for audio recording was obtained by patient/family members prior to recording.       This note was created with assistance from [employer] via capture of conversational audio. Consent was obtained from the patient and all parties present prior to recording.

## 2024-07-04 ENCOUNTER — Ambulatory Visit (HOSPITAL_BASED_OUTPATIENT_CLINIC_OR_DEPARTMENT_OTHER): Payer: Self-pay | Admitting: UROLOGY

## 2024-07-08 ENCOUNTER — Ambulatory Visit (INDEPENDENT_AMBULATORY_CARE_PROVIDER_SITE_OTHER): Payer: Self-pay | Admitting: Student in an Organized Health Care Education/Training Program

## 2024-07-08 DIAGNOSIS — D649 Anemia, unspecified: Secondary | ICD-10-CM

## 2024-07-08 NOTE — Telephone Encounter (Signed)
 Debbie notified. V/u. Burnard Blood, RN

## 2024-07-08 NOTE — Result Encounter Note (Signed)
 CBC improving  Otherwise stable and normal  Ask if he ever got the colonoscopy done

## 2024-07-08 NOTE — Telephone Encounter (Signed)
 Yes

## 2024-07-09 NOTE — Result Encounter Note (Signed)
 Let's get a FIT test

## 2024-07-10 NOTE — Result Encounter Note (Signed)
 Overall stable, will discuss at appointment

## 2024-07-12 NOTE — Result Encounter Note (Signed)
 Yes

## 2024-08-01 ENCOUNTER — Other Ambulatory Visit (INDEPENDENT_AMBULATORY_CARE_PROVIDER_SITE_OTHER): Payer: Self-pay | Admitting: Student in an Organized Health Care Education/Training Program

## 2024-08-01 DIAGNOSIS — I10 Essential (primary) hypertension: Secondary | ICD-10-CM

## 2024-08-01 NOTE — Telephone Encounter (Signed)
 Last visit with PCP was 03/11/2024   Last office visit in the department was 03/11/2024 .  Currently scheduled future appointment in the department is 09/10/2024.    Sonny Quale, RN, 08/01/2024 , 08:43

## 2024-09-09 ENCOUNTER — Ambulatory Visit (INDEPENDENT_AMBULATORY_CARE_PROVIDER_SITE_OTHER): Payer: Self-pay | Admitting: Student in an Organized Health Care Education/Training Program

## 2024-09-10 ENCOUNTER — Ambulatory Visit (INDEPENDENT_AMBULATORY_CARE_PROVIDER_SITE_OTHER): Admitting: Student in an Organized Health Care Education/Training Program
